# Patient Record
Sex: Female | Born: 1950 | Race: White | Hispanic: No | Marital: Single | Smoking: Never smoker
Health system: Southern US, Community
[De-identification: ages and names within clinical notes are randomized; demographics above are authoritative.]

## PROBLEM LIST (undated history)

## (undated) DIAGNOSIS — I350 Nonrheumatic aortic (valve) stenosis: Secondary | ICD-10-CM

## (undated) DIAGNOSIS — I480 Paroxysmal atrial fibrillation: Secondary | ICD-10-CM

## (undated) DIAGNOSIS — I10 Essential (primary) hypertension: Secondary | ICD-10-CM

## (undated) DIAGNOSIS — I059 Rheumatic mitral valve disease, unspecified: Secondary | ICD-10-CM

## (undated) DIAGNOSIS — S065XAA Traumatic subdural hemorrhage with loss of consciousness status unknown, initial encounter: Secondary | ICD-10-CM

## (undated) DIAGNOSIS — I4719 Other supraventricular tachycardia: Secondary | ICD-10-CM

## (undated) DIAGNOSIS — I5042 Chronic combined systolic (congestive) and diastolic (congestive) heart failure: Secondary | ICD-10-CM

## (undated) DIAGNOSIS — K766 Portal hypertension: Secondary | ICD-10-CM

## (undated) DIAGNOSIS — D696 Thrombocytopenia, unspecified: Secondary | ICD-10-CM

## (undated) DIAGNOSIS — E0590 Thyrotoxicosis, unspecified without thyrotoxic crisis or storm: Secondary | ICD-10-CM

## (undated) DIAGNOSIS — Z9289 Personal history of other medical treatment: Secondary | ICD-10-CM

## (undated) DIAGNOSIS — R0600 Dyspnea, unspecified: Secondary | ICD-10-CM

## (undated) DIAGNOSIS — E039 Hypothyroidism, unspecified: Secondary | ICD-10-CM

## (undated) DIAGNOSIS — I471 Supraventricular tachycardia: Secondary | ICD-10-CM

## (undated) DIAGNOSIS — Z954 Presence of other heart-valve replacement: Secondary | ICD-10-CM

## (undated) DIAGNOSIS — D649 Anemia, unspecified: Secondary | ICD-10-CM

## (undated) DIAGNOSIS — J189 Pneumonia, unspecified organism: Secondary | ICD-10-CM

## (undated) DIAGNOSIS — I484 Atypical atrial flutter: Secondary | ICD-10-CM

## (undated) DIAGNOSIS — Z952 Presence of prosthetic heart valve: Secondary | ICD-10-CM

## (undated) DIAGNOSIS — I8500 Esophageal varices without bleeding: Secondary | ICD-10-CM

## (undated) DIAGNOSIS — S065X9A Traumatic subdural hemorrhage with loss of consciousness of unspecified duration, initial encounter: Secondary | ICD-10-CM

## (undated) DIAGNOSIS — I2510 Atherosclerotic heart disease of native coronary artery without angina pectoris: Secondary | ICD-10-CM

## (undated) DIAGNOSIS — R161 Splenomegaly, not elsewhere classified: Secondary | ICD-10-CM

## (undated) DIAGNOSIS — I00 Rheumatic fever without heart involvement: Secondary | ICD-10-CM

## (undated) DIAGNOSIS — I272 Pulmonary hypertension, unspecified: Secondary | ICD-10-CM

## (undated) DIAGNOSIS — E669 Obesity, unspecified: Secondary | ICD-10-CM

## (undated) HISTORY — DX: Esophageal varices without bleeding: I85.00

## (undated) HISTORY — PX: BRAIN SURGERY: SHX531

## (undated) HISTORY — PX: CARDIAC CATHETERIZATION: SHX172

## (undated) HISTORY — DX: Pulmonary hypertension, unspecified: I27.20

## (undated) HISTORY — DX: Nonrheumatic aortic (valve) stenosis: I35.0

## (undated) HISTORY — DX: Paroxysmal atrial fibrillation: I48.0

## (undated) HISTORY — DX: Splenomegaly, not elsewhere classified: R16.1

## (undated) HISTORY — DX: Obesity, unspecified: E66.9

## (undated) HISTORY — DX: Supraventricular tachycardia: I47.1

## (undated) HISTORY — PX: TONSILLECTOMY: SUR1361

## (undated) HISTORY — DX: Portal hypertension: K76.6

## (undated) HISTORY — DX: Atypical atrial flutter: I48.4

## (undated) HISTORY — DX: Presence of other heart-valve replacement: Z95.4

## (undated) HISTORY — DX: Thyrotoxicosis, unspecified without thyrotoxic crisis or storm: E05.90

## (undated) HISTORY — DX: Essential (primary) hypertension: I10

## (undated) HISTORY — DX: Rheumatic mitral valve disease, unspecified: I05.9

## (undated) HISTORY — DX: Traumatic subdural hemorrhage with loss of consciousness status unknown, initial encounter: S06.5XAA

## (undated) HISTORY — DX: Chronic combined systolic (congestive) and diastolic (congestive) heart failure: I50.42

## (undated) HISTORY — DX: Traumatic subdural hemorrhage with loss of consciousness of unspecified duration, initial encounter: S06.5X9A

## (undated) HISTORY — DX: Rheumatic fever without heart involvement: I00

## (undated) HISTORY — DX: Other supraventricular tachycardia: I47.19

## (undated) HISTORY — PX: CARDIAC VALVE REPLACEMENT: SHX585

## (undated) HISTORY — DX: Hypothyroidism, unspecified: E03.9

---

## 1990-05-30 HISTORY — PX: LAPAROSCOPIC CHOLECYSTECTOMY: SUR755

## 1991-04-08 DIAGNOSIS — Z954 Presence of other heart-valve replacement: Secondary | ICD-10-CM

## 1991-04-08 HISTORY — DX: Presence of other heart-valve replacement: Z95.4

## 1991-04-08 HISTORY — PX: MITRAL VALVE REPLACEMENT: SHX147

## 1994-07-23 HISTORY — PX: TOTAL ABDOMINAL HYSTERECTOMY: SHX209

## 1997-07-27 ENCOUNTER — Ambulatory Visit (HOSPITAL_COMMUNITY): Admission: RE | Admit: 1997-07-27 | Discharge: 1997-07-27 | Payer: Self-pay | Admitting: Cardiology

## 1997-07-27 ENCOUNTER — Encounter (HOSPITAL_COMMUNITY): Admission: RE | Admit: 1997-07-27 | Discharge: 1997-10-25 | Payer: Self-pay | Admitting: Cardiology

## 1998-02-25 ENCOUNTER — Ambulatory Visit (HOSPITAL_COMMUNITY): Admission: RE | Admit: 1998-02-25 | Discharge: 1998-02-25 | Payer: Self-pay | Admitting: *Deleted

## 1998-04-02 HISTORY — PX: SUBDURAL HEMATOMA EVACUATION VIA CRANIOTOMY: SUR319

## 1998-05-24 ENCOUNTER — Ambulatory Visit (HOSPITAL_COMMUNITY): Admission: RE | Admit: 1998-05-24 | Discharge: 1998-05-24 | Payer: Self-pay | Admitting: *Deleted

## 1998-06-15 ENCOUNTER — Encounter: Payer: Self-pay | Admitting: Endocrinology

## 1998-06-15 ENCOUNTER — Ambulatory Visit (HOSPITAL_COMMUNITY): Admission: RE | Admit: 1998-06-15 | Discharge: 1998-06-15 | Payer: Self-pay | Admitting: Endocrinology

## 1998-10-24 ENCOUNTER — Inpatient Hospital Stay (HOSPITAL_COMMUNITY): Admission: RE | Admit: 1998-10-24 | Discharge: 1998-10-27 | Payer: Self-pay | Admitting: Neurosurgery

## 1998-10-25 ENCOUNTER — Encounter: Payer: Self-pay | Admitting: Neurosurgery

## 1998-11-01 ENCOUNTER — Ambulatory Visit (HOSPITAL_COMMUNITY): Admission: RE | Admit: 1998-11-01 | Discharge: 1998-11-01 | Payer: Self-pay | Admitting: Neurosurgery

## 1998-11-01 ENCOUNTER — Encounter: Payer: Self-pay | Admitting: Neurosurgery

## 1998-11-23 ENCOUNTER — Ambulatory Visit (HOSPITAL_COMMUNITY): Admission: RE | Admit: 1998-11-23 | Discharge: 1998-11-23 | Payer: Self-pay | Admitting: Neurosurgery

## 1998-11-23 ENCOUNTER — Encounter: Payer: Self-pay | Admitting: Neurosurgery

## 1999-01-30 ENCOUNTER — Encounter: Admission: RE | Admit: 1999-01-30 | Discharge: 1999-01-30 | Payer: Self-pay | Admitting: *Deleted

## 1999-02-28 ENCOUNTER — Ambulatory Visit (HOSPITAL_COMMUNITY): Admission: RE | Admit: 1999-02-28 | Discharge: 1999-02-28 | Payer: Self-pay | Admitting: *Deleted

## 2000-03-01 ENCOUNTER — Ambulatory Visit (HOSPITAL_COMMUNITY): Admission: RE | Admit: 2000-03-01 | Discharge: 2000-03-01 | Payer: Self-pay | Admitting: Internal Medicine

## 2000-03-01 ENCOUNTER — Encounter: Payer: Self-pay | Admitting: Internal Medicine

## 2001-03-12 ENCOUNTER — Ambulatory Visit (HOSPITAL_COMMUNITY): Admission: RE | Admit: 2001-03-12 | Discharge: 2001-03-12 | Payer: Self-pay | Admitting: Internal Medicine

## 2001-03-12 ENCOUNTER — Encounter: Payer: Self-pay | Admitting: Internal Medicine

## 2002-03-18 ENCOUNTER — Encounter: Payer: Self-pay | Admitting: Internal Medicine

## 2002-03-18 ENCOUNTER — Ambulatory Visit (HOSPITAL_COMMUNITY): Admission: RE | Admit: 2002-03-18 | Discharge: 2002-03-18 | Payer: Self-pay | Admitting: Internal Medicine

## 2003-03-18 ENCOUNTER — Encounter: Admission: RE | Admit: 2003-03-18 | Discharge: 2003-03-18 | Payer: Self-pay | Admitting: Internal Medicine

## 2003-03-23 ENCOUNTER — Ambulatory Visit (HOSPITAL_COMMUNITY): Admission: RE | Admit: 2003-03-23 | Discharge: 2003-03-23 | Payer: Self-pay | Admitting: Internal Medicine

## 2003-07-01 ENCOUNTER — Emergency Department (HOSPITAL_COMMUNITY): Admission: EM | Admit: 2003-07-01 | Discharge: 2003-07-01 | Payer: Self-pay | Admitting: Emergency Medicine

## 2004-04-11 ENCOUNTER — Ambulatory Visit (HOSPITAL_COMMUNITY): Admission: RE | Admit: 2004-04-11 | Discharge: 2004-04-11 | Payer: Self-pay | Admitting: Internal Medicine

## 2005-04-19 ENCOUNTER — Ambulatory Visit (HOSPITAL_COMMUNITY): Admission: RE | Admit: 2005-04-19 | Discharge: 2005-04-19 | Payer: Self-pay | Admitting: Internal Medicine

## 2006-05-20 ENCOUNTER — Ambulatory Visit (HOSPITAL_COMMUNITY): Admission: RE | Admit: 2006-05-20 | Discharge: 2006-05-20 | Payer: Self-pay | Admitting: *Deleted

## 2006-09-26 ENCOUNTER — Encounter: Admission: RE | Admit: 2006-09-26 | Discharge: 2006-09-26 | Payer: Self-pay | Admitting: Endocrinology

## 2006-10-10 ENCOUNTER — Encounter: Admission: RE | Admit: 2006-10-10 | Discharge: 2006-10-10 | Payer: Self-pay | Admitting: Endocrinology

## 2007-05-22 ENCOUNTER — Ambulatory Visit (HOSPITAL_COMMUNITY): Admission: RE | Admit: 2007-05-22 | Discharge: 2007-05-22 | Payer: Self-pay | Admitting: Internal Medicine

## 2008-01-23 ENCOUNTER — Other Ambulatory Visit: Admission: RE | Admit: 2008-01-23 | Discharge: 2008-01-23 | Payer: Self-pay | Admitting: Internal Medicine

## 2008-06-22 ENCOUNTER — Ambulatory Visit (HOSPITAL_COMMUNITY): Admission: RE | Admit: 2008-06-22 | Discharge: 2008-06-22 | Payer: Self-pay | Admitting: Internal Medicine

## 2009-06-27 ENCOUNTER — Ambulatory Visit (HOSPITAL_COMMUNITY): Admission: RE | Admit: 2009-06-27 | Discharge: 2009-06-27 | Payer: Self-pay | Admitting: Internal Medicine

## 2009-07-25 ENCOUNTER — Ambulatory Visit: Payer: Self-pay | Admitting: Oncology

## 2009-07-29 ENCOUNTER — Inpatient Hospital Stay (HOSPITAL_COMMUNITY): Admission: AD | Admit: 2009-07-29 | Discharge: 2009-08-02 | Payer: Self-pay | Admitting: Cardiology

## 2009-07-29 ENCOUNTER — Inpatient Hospital Stay (HOSPITAL_BASED_OUTPATIENT_CLINIC_OR_DEPARTMENT_OTHER): Admission: RE | Admit: 2009-07-29 | Discharge: 2009-07-29 | Payer: Self-pay | Admitting: Cardiology

## 2009-07-30 ENCOUNTER — Ambulatory Visit: Payer: Self-pay | Admitting: Oncology

## 2009-08-01 ENCOUNTER — Encounter (INDEPENDENT_AMBULATORY_CARE_PROVIDER_SITE_OTHER): Payer: Self-pay | Admitting: Cardiology

## 2009-08-02 ENCOUNTER — Ambulatory Visit: Payer: Self-pay | Admitting: Oncology

## 2009-09-15 ENCOUNTER — Ambulatory Visit: Payer: Self-pay | Admitting: Oncology

## 2009-09-19 LAB — CBC WITH DIFFERENTIAL/PLATELET
BASO%: 0.3 % (ref 0.0–2.0)
Basophils Absolute: 0 10*3/uL (ref 0.0–0.1)
EOS%: 3.1 % (ref 0.0–7.0)
HGB: 11.6 g/dL (ref 11.6–15.9)
LYMPH%: 20.6 % (ref 14.0–49.7)
MCV: 91.5 fL (ref 79.5–101.0)
MONO%: 7.4 % (ref 0.0–14.0)
NEUT#: 3.7 10*3/uL (ref 1.5–6.5)
NEUT%: 68.6 % (ref 38.4–76.8)
Platelets: 77 10*3/uL — ABNORMAL LOW (ref 145–400)

## 2009-09-19 LAB — MORPHOLOGY: PLT EST: DECREASED

## 2010-06-05 ENCOUNTER — Other Ambulatory Visit (HOSPITAL_COMMUNITY): Payer: Self-pay | Admitting: Internal Medicine

## 2010-06-05 DIAGNOSIS — Z1231 Encounter for screening mammogram for malignant neoplasm of breast: Secondary | ICD-10-CM

## 2010-06-20 LAB — BASIC METABOLIC PANEL
BUN: 16 mg/dL (ref 6–23)
Calcium: 8.9 mg/dL (ref 8.4–10.5)
Creatinine, Ser: 0.75 mg/dL (ref 0.4–1.2)
GFR calc non Af Amer: >60 mL/min (ref >60)
Glucose, Bld: 165 mg/dL — ABNORMAL HIGH (ref 70–99)
Potassium: 3.9 mEq/L (ref 3.5–5.1)
Sodium: 135 mEq/L (ref 135–145)

## 2010-06-20 LAB — LACTATE DEHYDROGENASE: LDH: 219 U/L (ref 94–250)

## 2010-06-20 LAB — CROSSMATCH

## 2010-06-20 LAB — DIFFERENTIAL
Basophils Absolute: 0 10*3/uL (ref 0.0–0.1)
Basophils Absolute: 0 10*3/uL (ref 0.0–0.1)
Basophils Relative: 0 % (ref 0–1)
Basophils Relative: 0 % (ref 0–1)
Basophils Relative: 1 % (ref 0–1)
Eosinophils Absolute: 0.1 10*3/uL (ref 0.0–0.7)
Eosinophils Relative: 1 % (ref 0–5)
Eosinophils Relative: 4 % (ref 0–5)
Eosinophils Relative: 3 % (ref 0–5)
Lymphocytes Relative: 16 % (ref 12–46)
Lymphocytes Relative: 20 % (ref 12–46)
Lymphocytes Relative: 21 % (ref 12–46)
Lymphs Abs: 0.9 10*3/uL (ref 0.7–4.0)
Lymphs Abs: 1.3 10*3/uL (ref 0.7–4.0)
Lymphs Abs: 1 10*3/uL (ref 0.7–4.0)
Monocytes Absolute: 0.3 10*3/uL (ref 0.1–1.0)
Monocytes Absolute: 0.4 10*3/uL (ref 0.1–1.0)
Monocytes Absolute: 0.4 10*3/uL (ref 0.1–1.0)
Monocytes Relative: 7 % (ref 3–12)
Monocytes Relative: 7 % (ref 3–12)
Neutro Abs: 3.4 10*3/uL (ref 1.7–7.7)
Neutro Abs: 2.4 10*3/uL (ref 1.7–7.7)
Neutrophils Relative %: 68 % (ref 43–77)

## 2010-06-20 LAB — HEPARIN INDUCED THROMBOCYTOPENIA PNL
Patient O.D.: 0.282
UFH High Dose UFH H: 0 % Release
UFH Low Dose 0.1 IU/mL: 0 % Release
UFH Low Dose 0.5 IU/mL: 0 % Release
UFH SRA Result: NEGATIVE

## 2010-06-20 LAB — COMPREHENSIVE METABOLIC PANEL
Albumin: 3.5 g/dL (ref 3.5–5.2)
Alkaline Phosphatase: 100 U/L (ref 39–117)
GFR calc Af Amer: >60 mL/min (ref >60)
GFR calc non Af Amer: >60 mL/min (ref >60)
Potassium: 3.7 mEq/L (ref 3.5–5.1)
Sodium: 139 mEq/L (ref 135–145)
Total Bilirubin: 1.8 mg/dL — ABNORMAL HIGH (ref 0.3–1.2)
Total Protein: 6.8 g/dL (ref 6.0–8.3)

## 2010-06-20 LAB — CBC
HCT: 33.7 % — ABNORMAL LOW (ref 36.0–46.0)
HCT: 36.6 % (ref 36.0–46.0)
HCT: 37.8 % (ref 36.0–46.0)
HCT: 32.1 % — ABNORMAL LOW (ref 36.0–46.0)
Hemoglobin: 12.4 g/dL (ref 12.0–15.0)
Hemoglobin: 13 g/dL (ref 12.0–15.0)
Hemoglobin: 13 g/dL (ref 12.0–15.0)
MCHC: 35.9 g/dL (ref 30.0–36.0)
MCV: 92.5 fL (ref 78.0–100.0)
MCV: 93.5 fL (ref 78.0–100.0)
Platelets: 57 10*3/uL — ABNORMAL LOW (ref 150–400)
Platelets: 51 10*3/uL — ABNORMAL LOW (ref 150–400)
Platelets: 59 10*3/uL — ABNORMAL LOW (ref 150–400)
RBC: 3.6 MIL/uL — ABNORMAL LOW (ref 3.87–5.11)
RBC: 3.72 MIL/uL — ABNORMAL LOW (ref 3.87–5.11)
RDW: 15 % (ref 11.5–15.5)
RDW: 15.1 % (ref 11.5–15.5)
RDW: 15.3 % (ref 11.5–15.5)
WBC: 5.9 10*3/uL (ref 4.0–10.5)
WBC: 6.6 10*3/uL (ref 4.0–10.5)
WBC: 5.1 10*3/uL (ref 4.0–10.5)

## 2010-06-20 LAB — PATHOLOGIST SMEAR REVIEW

## 2010-06-20 LAB — PROTIME-INR
Prothrombin Time: 14.4 seconds (ref 11.6–15.2)
Prothrombin Time: 14.3 seconds (ref 11.6–15.2)

## 2010-06-20 LAB — BONE MARROW EXAM

## 2010-06-30 ENCOUNTER — Ambulatory Visit (HOSPITAL_COMMUNITY)
Admission: RE | Admit: 2010-06-30 | Discharge: 2010-06-30 | Disposition: A | Discharge status: Home / Self Care | Payer: 59 | Source: Ambulatory Visit | Attending: Internal Medicine | Admitting: Internal Medicine

## 2010-06-30 DIAGNOSIS — Z1231 Encounter for screening mammogram for malignant neoplasm of breast: Secondary | ICD-10-CM | POA: Insufficient documentation

## 2011-07-09 ENCOUNTER — Other Ambulatory Visit (HOSPITAL_COMMUNITY): Payer: Self-pay | Admitting: Internal Medicine

## 2011-07-09 DIAGNOSIS — Z1231 Encounter for screening mammogram for malignant neoplasm of breast: Secondary | ICD-10-CM

## 2011-08-01 ENCOUNTER — Ambulatory Visit (HOSPITAL_COMMUNITY)
Admission: RE | Admit: 2011-08-01 | Discharge: 2011-08-01 | Disposition: A | Discharge status: Home / Self Care | Payer: 59 | Source: Ambulatory Visit | Attending: Internal Medicine | Admitting: Internal Medicine

## 2011-08-01 DIAGNOSIS — Z1231 Encounter for screening mammogram for malignant neoplasm of breast: Secondary | ICD-10-CM | POA: Insufficient documentation

## 2012-06-27 ENCOUNTER — Other Ambulatory Visit (HOSPITAL_COMMUNITY): Payer: Self-pay | Admitting: Internal Medicine

## 2012-06-27 DIAGNOSIS — Z1231 Encounter for screening mammogram for malignant neoplasm of breast: Secondary | ICD-10-CM

## 2012-08-12 ENCOUNTER — Ambulatory Visit (HOSPITAL_COMMUNITY)
Admission: RE | Admit: 2012-08-12 | Discharge: 2012-08-12 | Disposition: A | Discharge status: Home / Self Care | Payer: 59 | Source: Ambulatory Visit | Attending: Internal Medicine | Admitting: Internal Medicine

## 2012-08-12 DIAGNOSIS — Z1231 Encounter for screening mammogram for malignant neoplasm of breast: Secondary | ICD-10-CM | POA: Insufficient documentation

## 2013-01-15 ENCOUNTER — Encounter: Payer: Self-pay | Admitting: *Deleted

## 2013-01-15 ENCOUNTER — Encounter: Payer: Self-pay | Admitting: Cardiology

## 2013-01-15 DIAGNOSIS — I10 Essential (primary) hypertension: Secondary | ICD-10-CM | POA: Insufficient documentation

## 2013-01-15 DIAGNOSIS — I059 Rheumatic mitral valve disease, unspecified: Secondary | ICD-10-CM | POA: Insufficient documentation

## 2013-01-15 DIAGNOSIS — E039 Hypothyroidism, unspecified: Secondary | ICD-10-CM | POA: Insufficient documentation

## 2013-01-15 DIAGNOSIS — E0590 Thyrotoxicosis, unspecified without thyrotoxic crisis or storm: Secondary | ICD-10-CM | POA: Insufficient documentation

## 2013-01-15 DIAGNOSIS — I519 Heart disease, unspecified: Secondary | ICD-10-CM | POA: Insufficient documentation

## 2013-01-15 DIAGNOSIS — T148XXA Other injury of unspecified body region, initial encounter: Secondary | ICD-10-CM | POA: Insufficient documentation

## 2013-01-15 DIAGNOSIS — I4819 Other persistent atrial fibrillation: Secondary | ICD-10-CM | POA: Insufficient documentation

## 2013-01-18 ENCOUNTER — Encounter: Payer: Self-pay | Admitting: Cardiology

## 2013-01-18 DIAGNOSIS — I099 Rheumatic heart disease, unspecified: Secondary | ICD-10-CM | POA: Insufficient documentation

## 2013-01-18 DIAGNOSIS — E70319 Ocular albinism, unspecified: Secondary | ICD-10-CM | POA: Insufficient documentation

## 2013-01-19 ENCOUNTER — Ambulatory Visit (HOSPITAL_COMMUNITY): Payer: 59 | Attending: Cardiology | Admitting: Radiology

## 2013-01-19 ENCOUNTER — Encounter: Payer: Self-pay | Admitting: Cardiology

## 2013-01-19 ENCOUNTER — Ambulatory Visit (INDEPENDENT_AMBULATORY_CARE_PROVIDER_SITE_OTHER): Payer: 59 | Admitting: Cardiology

## 2013-01-19 ENCOUNTER — Other Ambulatory Visit (HOSPITAL_COMMUNITY): Payer: Self-pay | Admitting: Cardiology

## 2013-01-19 VITALS — BP 144/79 | HR 83 | Wt 182.0 lb

## 2013-01-19 DIAGNOSIS — I5189 Other ill-defined heart diseases: Secondary | ICD-10-CM

## 2013-01-19 DIAGNOSIS — I4891 Unspecified atrial fibrillation: Secondary | ICD-10-CM | POA: Insufficient documentation

## 2013-01-19 DIAGNOSIS — I10 Essential (primary) hypertension: Secondary | ICD-10-CM

## 2013-01-19 DIAGNOSIS — I059 Rheumatic mitral valve disease, unspecified: Secondary | ICD-10-CM

## 2013-01-19 DIAGNOSIS — I359 Nonrheumatic aortic valve disorder, unspecified: Secondary | ICD-10-CM

## 2013-01-19 DIAGNOSIS — I2789 Other specified pulmonary heart diseases: Secondary | ICD-10-CM

## 2013-01-19 DIAGNOSIS — I272 Pulmonary hypertension, unspecified: Secondary | ICD-10-CM

## 2013-01-19 DIAGNOSIS — I519 Heart disease, unspecified: Secondary | ICD-10-CM

## 2013-01-19 DIAGNOSIS — R0602 Shortness of breath: Secondary | ICD-10-CM

## 2013-01-19 DIAGNOSIS — I351 Nonrheumatic aortic (valve) insufficiency: Secondary | ICD-10-CM | POA: Insufficient documentation

## 2013-01-19 DIAGNOSIS — I5042 Chronic combined systolic (congestive) and diastolic (congestive) heart failure: Secondary | ICD-10-CM | POA: Insufficient documentation

## 2013-01-19 DIAGNOSIS — I350 Nonrheumatic aortic (valve) stenosis: Secondary | ICD-10-CM

## 2013-01-19 DIAGNOSIS — I099 Rheumatic heart disease, unspecified: Secondary | ICD-10-CM | POA: Insufficient documentation

## 2013-01-19 DIAGNOSIS — I079 Rheumatic tricuspid valve disease, unspecified: Secondary | ICD-10-CM | POA: Insufficient documentation

## 2013-01-19 LAB — BASIC METABOLIC PANEL
CO2: 30 mEq/L (ref 19–32)
Calcium: 9.6 mg/dL (ref 8.4–10.5)
GFR: 58.9 mL/min — ABNORMAL LOW (ref >60.00)
Potassium: 4.2 mEq/L (ref 3.5–5.1)
Sodium: 140 mEq/L (ref 135–145)

## 2013-01-19 NOTE — Progress Notes (Signed)
Echocardiogram performed.  

## 2013-01-19 NOTE — Patient Instructions (Signed)
Please go to the lab today and have a BMET and a BNP drawn.  Your physician recommends that you continue on your current medications as directed. Please refer to the Current Medication list given to you today.  Your physician wants you to follow-up in: 6 months with Dr. Sherlyn Lick will receive a reminder letter in the mail two months in advance. If you don't receive a letter, please call our office to schedule the follow-up appointment.

## 2013-01-19 NOTE — Progress Notes (Signed)
[address] [place], [place]  [address] Phone: [phone] Fax:  [phone]  Date:  01/19/2013   ID:  Jamie Howell, DOB 11/26/1950, MRN [id number]  PCP:  No primary provider on file.  Cardiologist:  Armanda Magic, MD     History of Present Illness: Jamie Howell is a 62 y.o. female with a history of Rheumatic heart disease s/p St Jude MVR, atrial fibrillation, HTN, moderate AS, grade III diastolic dysfunction, mild pulmonary HTN who presents today for followup.  She is doing well.  She denies any chest pain, dizziness, palpitations, LE edema, or syncope.  She denies any PND or orthopnea but has noticed some DOE when walking up stairs or hills outside but not inside.  This seems to increase when she has gained weight.  Her weight fluctuates from time to time by about 10 pounds and is 10 pounds up from last OV.  She denies any LE edema.     Wt Readings from Last 3 Encounters:  01/19/13 182 lb (82.555 kg)     Past Medical History  Diagnosis Date  . HTN (hypertension)   . Thyrotoxicosis      without mention of goiter or other cause, without mention of thyrotoxic crisis or storm  . Hypothyroidism   . 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 Atrial fibrillation   . Atrial flutter   . Heart disease   . Hematoma   . Subdural hematoma   . Pulmonary HTN     mild  . OA (ocular albinism)   . Aortic valve stenosis     moderate aortic valve stenosis with moderate aortic insufficiency, stable MVR, trivial PR, mild TR, severe LAE, grade II-III diastolic dysfunction,mild pulmonary HTN , EF 50% by echo 12/2012    Current Outpatient Prescriptions  Medication Sig Dispense Refill  . levothyroxine (SYNTHROID, LEVOTHROID) 150 MCG tablet Take 150 mcg by mouth daily before breakfast.      . amLODipine (NORVASC) 2.5 MG tablet Take 2.5 mg by mouth daily.      . cyclobenzaprine (FLEXERIL) 10 MG tablet Take 10 mg by mouth 3 (three) times daily  as needed for muscle spasms. 1/2 tablet      . furosemide (LASIX) 20 MG tablet Take 20 mg by mouth.      . metoprolol succinate (TOPROL-XL) 50 MG 24 hr tablet Take 50 mg by mouth daily. Take with or immediately following a meal.      . warfarin (COUMADIN) 2.5 MG tablet Take 2.5 mg by mouth daily.       No current facility-administered medications for this visit.    Allergies:    Allergies  Allergen Reactions  . Penicillins   . Sulfa Antibiotics     Social History:  The patient  reports that she has never smoked. She does not have any smokeless tobacco history on file. She reports that she does not drink alcohol or use illicit drugs.   Family History:  The patient's family history includes CAD in her father and mother; Cirrhosis in her brother; Heart attack in her sister; Hypertension in her father.   ROS:  Please see the history of present illness.      All other systems reviewed and negative.   PHYSICAL EXAM: VS:  BP 144/79  Pulse 83  Wt 182 lb (82.555 kg) Well nourished, well developed, in no acute distress HEENT: normal Neck: no JVD Cardiac:  normal S1, S2; RRR; 2/6 SM at RUSB radiating to LLSB, crisp mechanical heart sounds Lungs:  clear to auscultation bilaterally, no wheezing, rhonchi or rales Abd: soft, nontender, no hepatomegaly Ext: no edema Skin: warm and dry Neuro:  CNs 2-12 intact, no focal abnormalities noted      ASSESSMENT AND PLAN:  1. Atrial fibrilation maintaining NSR  - continue metoprolol and warfarin 2. Chronic anticoagulation 3. HTN - fairly well controlled  - continue metoprolol 4. Rheumatic heart disease s/p mechanical MVR 5. Moderate AS/AR - no change since echo 2013 6. Grade III diastolic dysfunction  - continue Lasix  - check BMET 7. Mild pulmonary HTN 8. I suspect that her DOE is due to obesity and increase weight gain.  Her echo appears stable.  She had a cath in 2011 that showed normal coronary arteries  - Check BNP  - I have encouraged  her to try to exercise and watch her diet to try to lose some weight.  She will let me know if her SOB worsens.  Followup with me in 6 months  Signed, Armanda Magic, MD 01/19/2013 2:30 PM

## 2013-05-20 ENCOUNTER — Telehealth: Payer: Self-pay | Admitting: Pharmacist

## 2013-05-20 NOTE — Telephone Encounter (Signed)
Received a fax from [facility] requesting a refill for warfarin.  Patient stated 10 months ago that she was going to go elsewhere to have protimes managed.  I called and spoke with patient and she tells me she has [facility] to have protime.  She has enough coumadin to make it to this visit.  I informed her that they would be the ones to refill her warfarin as they will be monitoring protimes, and that she will need to ask for a refill when sees them next week.  She is agreeable to this, and shows verbal understanding.

## 2013-05-20 NOTE — Telephone Encounter (Signed)
Message copied by Lou MinerSMART, Holly Pring G on Wed May 20, 2013 12:03 PM ------      Message from: Carmela HurtADAMS, KIMBERLY G      Created: Mon May 18, 2013  9:11 AM      Regarding: refill coumadin       [facility] ------

## 2013-07-13 ENCOUNTER — Other Ambulatory Visit (HOSPITAL_COMMUNITY): Payer: Self-pay | Admitting: Internal Medicine

## 2013-07-13 DIAGNOSIS — Z1231 Encounter for screening mammogram for malignant neoplasm of breast: Secondary | ICD-10-CM

## 2013-08-28 ENCOUNTER — Ambulatory Visit (HOSPITAL_COMMUNITY)
Admission: RE | Admit: 2013-08-28 | Discharge: 2013-08-28 | Disposition: A | Discharge status: Home / Self Care | Payer: 59 | Source: Ambulatory Visit | Attending: Internal Medicine | Admitting: Internal Medicine

## 2013-08-28 DIAGNOSIS — Z1231 Encounter for screening mammogram for malignant neoplasm of breast: Secondary | ICD-10-CM

## 2013-09-17 ENCOUNTER — Other Ambulatory Visit: Payer: Self-pay | Admitting: *Deleted

## 2013-09-17 MED ORDER — AMLODIPINE BESYLATE 2.5 MG PO TABS: 2.5000 mg | ORAL_TABLET | Freq: Every day | ORAL | Status: DC

## 2013-10-22 ENCOUNTER — Other Ambulatory Visit: Payer: Self-pay

## 2013-10-22 MED ORDER — AMLODIPINE BESYLATE 2.5 MG PO TABS: 2.5000 mg | ORAL_TABLET | Freq: Every day | ORAL | Status: DC

## 2013-10-22 MED ORDER — METOPROLOL SUCCINATE ER 50 MG PO TB24: 50.0000 mg | ORAL_TABLET | Freq: Every day | ORAL | Status: DC

## 2013-12-10 ENCOUNTER — Ambulatory Visit (INDEPENDENT_AMBULATORY_CARE_PROVIDER_SITE_OTHER): Payer: 59 | Admitting: Cardiology

## 2013-12-10 ENCOUNTER — Encounter: Payer: Self-pay | Admitting: Cardiology

## 2013-12-10 VITALS — BP 124/82 | HR 80 | Ht 59.0 in | Wt 191.0 lb

## 2013-12-10 DIAGNOSIS — I10 Essential (primary) hypertension: Secondary | ICD-10-CM

## 2013-12-10 DIAGNOSIS — I480 Paroxysmal atrial fibrillation: Secondary | ICD-10-CM

## 2013-12-10 DIAGNOSIS — I350 Nonrheumatic aortic (valve) stenosis: Secondary | ICD-10-CM

## 2013-12-10 DIAGNOSIS — I059 Rheumatic mitral valve disease, unspecified: Secondary | ICD-10-CM

## 2013-12-10 DIAGNOSIS — I5032 Chronic diastolic (congestive) heart failure: Secondary | ICD-10-CM

## 2013-12-10 DIAGNOSIS — I509 Heart failure, unspecified: Secondary | ICD-10-CM

## 2013-12-10 DIAGNOSIS — I4891 Unspecified atrial fibrillation: Secondary | ICD-10-CM

## 2013-12-10 DIAGNOSIS — R0602 Shortness of breath: Secondary | ICD-10-CM

## 2013-12-10 DIAGNOSIS — I359 Nonrheumatic aortic valve disorder, unspecified: Secondary | ICD-10-CM

## 2013-12-10 DIAGNOSIS — I272 Pulmonary hypertension, unspecified: Secondary | ICD-10-CM

## 2013-12-10 DIAGNOSIS — I2789 Other specified pulmonary heart diseases: Secondary | ICD-10-CM

## 2013-12-10 LAB — BASIC METABOLIC PANEL
BUN: 18 mg/dL (ref 6–23)
CALCIUM: 8.9 mg/dL (ref 8.4–10.5)
CO2: 28 meq/L (ref 19–32)
Chloride: 102 mEq/L (ref 96–112)
Creatinine, Ser: 1 mg/dL (ref 0.4–1.2)
GFR: 58.73 mL/min — ABNORMAL LOW (ref >60.00)
Glucose, Bld: 156 mg/dL — ABNORMAL HIGH (ref 70–99)
Potassium: 3.8 mEq/L (ref 3.5–5.1)
Sodium: 136 mEq/L (ref 135–145)

## 2013-12-10 LAB — BRAIN NATRIURETIC PEPTIDE: PRO B NATRI PEPTIDE: 329 pg/mL — AB (ref 0.0–100.0)

## 2013-12-10 MED ORDER — ASPIRIN EC 81 MG PO TBEC: 81.0000 mg | DELAYED_RELEASE_TABLET | Freq: Every day | ORAL | Status: DC

## 2013-12-10 NOTE — Progress Notes (Signed)
[address] [place], [place]  [address] Phone: [phone] Fax:  [phone]  Date:  12/10/2013   ID:  Jamie Howell, DOB Nov 12, 1950, MRN [id number]  PCP:  Londell Moh, MD  Cardiologist:  Armanda Magic, MD     History of Present Illness: Jamie Howell is a 63 y.o. female with a history of Rheumatic heart disease s/p St Jude MVR, atrial fibrillation, HTN, moderate AS, grade III diastolic dysfunction, mild pulmonary HTN who presents today for followup. She is doing well. She denies any chest pain, dizziness, palpitations, LE edema, or syncope. She denies any PND or orthopnea but has some chronic DOE when walking outside in the humid hot weather. This seems to increase when she has gained weight and she has gained 9 lbs since last year.  She is not walking currently due to the hot weather.    Wt Readings from Last 3 Encounters:  12/10/13 191 lb (86.637 kg)  01/19/13 182 lb (82.555 kg)     Past Medical History  Diagnosis Date  . HTN (hypertension)   . Thyrotoxicosis      without mention of goiter or other cause, without mention of thyrotoxic crisis or storm  . Hypothyroidism   . 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 Atrial fibrillation   . Atrial flutter   . Heart disease   . Hematoma   . Subdural hematoma   . Pulmonary HTN     mild  . OA (ocular albinism)   . Aortic valve stenosis     moderate aortic valve stenosis with moderate aortic insufficiency, stable MVR, trivial PR, mild TR, severe LAE, grade II-III diastolic dysfunction,mild pulmonary HTN , EF 50% by echo 12/2012    Current Outpatient Prescriptions  Medication Sig Dispense Refill  . amLODipine (NORVASC) 2.5 MG tablet Take 1 tablet (2.5 mg total) by mouth daily.  30 tablet  1  . Calcium Carb-Cholecalciferol (CALCIUM 1000 + D PO) Take 1,000 mg by mouth daily.      . cyclobenzaprine (FLEXERIL) 10 MG tablet Take 10 mg by mouth 3 (three) times daily as  needed for muscle spasms. 1/2 tablet      . furosemide (LASIX) 20 MG tablet Take 20 mg by mouth.      . levothyroxine (SYNTHROID, LEVOTHROID) 150 MCG tablet Take 150 mcg by mouth daily before breakfast.      . metoprolol succinate (TOPROL-XL) 50 MG 24 hr tablet Take 1 tablet (50 mg total) by mouth daily. Take with or immediately following a meal.  30 tablet  1  . warfarin (COUMADIN) 2.5 MG tablet Take 2.5 mg by mouth daily.       No current facility-administered medications for this visit.    Allergies:    Allergies  Allergen Reactions  . Penicillins   . Sulfa Antibiotics     Social History:  The patient  reports that she has never smoked. She does not have any smokeless tobacco history on file. She reports that she does not drink alcohol or use illicit drugs.   Family History:  The patient's family history includes CAD in her father and mother; Cirrhosis in her brother; Heart attack in her sister; Hypertension in her father.   ROS:  Please see the history of present illness.      All other systems reviewed and negative.   PHYSICAL EXAM: VS:  BP 124/82  Pulse 80  Ht 4'  11" (1.499 m)  Wt 191 lb (86.637 kg)  BMI 38.56 kg/m2 Well nourished, well developed, in no acute distress HEENT: normal Neck: no JVD Cardiac:  normal S1, S2; RRR; 2/6 SM at RUSB to LLSB, crisp mechanical heart sounds at apex Lungs:  clear to auscultation bilaterally, no wheezing, rhonchi or rales Abd: soft, nontender, no hepatomegaly Ext: no edema Skin: warm and dry Neuro:  CNs 2-12 intact, no focal abnormalities noted  EKG:  NSR at 70bpm with occasional PVC's and lateral ST abnormality - no change form EKG 2014  ASSESSMENT AND PLAN:  1. Atrial fibrilation maintaining NSR - continue metoprolol and warfarin  - I also instructed her to take ASA  daily 2. Chronic anticoagulation for MVR and PAF - followed by her PCP 3. HTN -  well controlled - continue metoprolol/amlodipine 4. Rheumatic heart disease  s/p mechanical MVR 5. Moderate AS/AR - no change since echo 2014 - repeat 2D echo to reassess AS and pulmonary HTN        6.  Chronic diastolic CHF- with some SOB with exertion in the hot weather.  She appears euvolemic on exam.   - check BMET/BNP - continue Lasix 7. Mild pulmonary HTN 8. Normal coronary arteries by cath 2011 9. Chronic SOB secondary to #6 which is worse out in the hot humid weather - check BNP  Followup with me in 6 months       Signed, Armanda Magic, MD 12/10/2013 10:20 AM

## 2013-12-10 NOTE — Patient Instructions (Signed)
Your physician has recommended you make the following change in your medication: 1. Add a Baby Aspirin 81 MG daily  Your physician recommends that you go to the lab today for a BMET and BNP  Your physician has requested that you have an echocardiogram. Echocardiography is a painless test that uses sound waves to create images of your heart. It provides your doctor with information about the size and shape of your heart and how well your heart's chambers and valves are working. This procedure takes approximately one hour. There are no restrictions for this procedure.  Your physician wants you to follow-up in: 6 months with Dr Sherlyn Lick will receive a reminder letter in the mail two months in advance. If you don't receive a letter, please call our office to schedule the follow-up appointment.

## 2013-12-11 ENCOUNTER — Other Ambulatory Visit: Payer: Self-pay | Admitting: General Surgery

## 2013-12-11 DIAGNOSIS — Z79899 Other long term (current) drug therapy: Secondary | ICD-10-CM

## 2013-12-11 MED ORDER — FUROSEMIDE 20 MG PO TABS: 40.0000 mg | ORAL_TABLET | Freq: Every day | ORAL | Status: DC

## 2013-12-11 MED ORDER — POTASSIUM CHLORIDE CRYS ER 20 MEQ PO TBCR: 20.0000 meq | EXTENDED_RELEASE_TABLET | Freq: Every day | ORAL | Status: DC

## 2013-12-17 ENCOUNTER — Other Ambulatory Visit (HOSPITAL_COMMUNITY): Payer: 59

## 2013-12-18 ENCOUNTER — Other Ambulatory Visit (INDEPENDENT_AMBULATORY_CARE_PROVIDER_SITE_OTHER): Payer: 59

## 2013-12-18 ENCOUNTER — Encounter: Payer: Self-pay | Admitting: General Surgery

## 2013-12-18 DIAGNOSIS — Z79899 Other long term (current) drug therapy: Secondary | ICD-10-CM

## 2013-12-18 LAB — BASIC METABOLIC PANEL
BUN: 20 mg/dL (ref 6–23)
CALCIUM: 8.7 mg/dL (ref 8.4–10.5)
CO2: 28 mEq/L (ref 19–32)
CREATININE: 1.1 mg/dL (ref 0.4–1.2)
Chloride: 101 mEq/L (ref 96–112)
GFR: 54.35 mL/min — AB (ref >60.00)
Glucose, Bld: 286 mg/dL — ABNORMAL HIGH (ref 70–99)
Potassium: 4 mEq/L (ref 3.5–5.1)
SODIUM: 134 meq/L — AB (ref 135–145)

## 2014-01-21 ENCOUNTER — Ambulatory Visit (HOSPITAL_COMMUNITY): Payer: 59 | Attending: Cardiology | Admitting: Radiology

## 2014-01-21 DIAGNOSIS — I10 Essential (primary) hypertension: Secondary | ICD-10-CM | POA: Diagnosis not present

## 2014-01-21 DIAGNOSIS — I350 Nonrheumatic aortic (valve) stenosis: Secondary | ICD-10-CM

## 2014-01-21 DIAGNOSIS — I059 Rheumatic mitral valve disease, unspecified: Secondary | ICD-10-CM

## 2014-01-21 NOTE — Progress Notes (Signed)
Echocardiogram performed.  

## 2014-01-29 ENCOUNTER — Telehealth: Payer: Self-pay | Admitting: Cardiology

## 2014-01-29 NOTE — Telephone Encounter (Signed)
Informed patient hat per Dr. Mayford Knifeurner, her ECHO showed low normal LVF with mildly thickened walls,with moderate to severe AS.  MV prosthesis appears to be functioning normally - repeat echo in 6 months to follow AS.

## 2014-01-29 NOTE — Telephone Encounter (Signed)
New problem   Pt want to know result of echocardiogram. Please call pt at work

## 2014-02-12 ENCOUNTER — Other Ambulatory Visit: Payer: Self-pay | Admitting: *Deleted

## 2014-02-12 MED ORDER — AMLODIPINE BESYLATE 2.5 MG PO TABS: 2.5000 mg | ORAL_TABLET | Freq: Every day | ORAL | Status: DC

## 2014-02-12 MED ORDER — METOPROLOL SUCCINATE ER 50 MG PO TB24: 50.0000 mg | ORAL_TABLET | Freq: Every day | ORAL | Status: DC

## 2014-03-31 ENCOUNTER — Encounter: Payer: Self-pay | Admitting: Cardiology

## 2014-05-07 ENCOUNTER — Ambulatory Visit (INDEPENDENT_AMBULATORY_CARE_PROVIDER_SITE_OTHER): Payer: 59 | Admitting: Cardiology

## 2014-05-07 ENCOUNTER — Encounter: Payer: Self-pay | Admitting: Cardiology

## 2014-05-07 VITALS — BP 150/82 | HR 93 | Ht 59.0 in | Wt 197.0 lb

## 2014-05-07 DIAGNOSIS — R0602 Shortness of breath: Secondary | ICD-10-CM | POA: Insufficient documentation

## 2014-05-07 DIAGNOSIS — I10 Essential (primary) hypertension: Secondary | ICD-10-CM

## 2014-05-07 DIAGNOSIS — I5032 Chronic diastolic (congestive) heart failure: Secondary | ICD-10-CM

## 2014-05-07 DIAGNOSIS — I059 Rheumatic mitral valve disease, unspecified: Secondary | ICD-10-CM

## 2014-05-07 DIAGNOSIS — I272 Pulmonary hypertension, unspecified: Secondary | ICD-10-CM

## 2014-05-07 DIAGNOSIS — I4891 Unspecified atrial fibrillation: Secondary | ICD-10-CM

## 2014-05-07 DIAGNOSIS — I350 Nonrheumatic aortic (valve) stenosis: Secondary | ICD-10-CM

## 2014-05-07 DIAGNOSIS — I480 Paroxysmal atrial fibrillation: Secondary | ICD-10-CM

## 2014-05-07 DIAGNOSIS — I270 Primary pulmonary hypertension: Secondary | ICD-10-CM

## 2014-05-07 LAB — BASIC METABOLIC PANEL
BUN: 26 mg/dL — AB (ref 6–23)
CALCIUM: 9.1 mg/dL (ref 8.4–10.5)
CO2: 30 meq/L (ref 19–32)
CREATININE: 1.06 mg/dL (ref 0.40–1.20)
Chloride: 102 mEq/L (ref 96–112)
GFR: 55.47 mL/min — AB (ref >60.00)
GLUCOSE: 154 mg/dL — AB (ref 70–99)
Potassium: 4 mEq/L (ref 3.5–5.1)
SODIUM: 135 meq/L (ref 135–145)

## 2014-05-07 LAB — BRAIN NATRIURETIC PEPTIDE: Pro B Natriuretic peptide (BNP): 561 pg/mL — ABNORMAL HIGH (ref 0.0–100.0)

## 2014-05-07 NOTE — Patient Instructions (Signed)
Your physician recommends that you have lab work TODAY (BMET, BNP).  Your physician has requested that you have an echocardiogram AS SOON AS POSSIBLE. Echocardiography is a painless test that uses sound waves to create images of your heart. It provides your doctor with information about the size and shape of your heart and how well your heart's chambers and valves are working. This procedure takes approximately one hour. There are no restrictions for this procedure.  Your physician has recommended that you wear an event monitor. Event monitors are medical devices that record the heart's electrical activity. Doctors most often us these monitors to diagnose arrhythmias. Arrhythmias are problems with the speed or rhythm of the heartbeat. The monitor is a small, portable device. You can wear one while you do your normal daily activities. This is usually used to diagnose what is causing palpitations/syncope (passing out).  Your physician wants you to follow-up in: 6 months with Dr. Mayford Knifeurner. You will receive a reminder letter in the mail two months in advance. If you don't receive a letter, please call our office to schedule the follow-up appointment.

## 2014-05-07 NOTE — Progress Notes (Signed)
Cardiology Office Note   Date:  05/07/2014   ID:  Jamie Howell, DOB 07/21/1950, MRN [id number]  PCP:  Londell MohPHARR,[name], MD  Cardiologist:   Quintella ReichertURNER,Mccauley Diehl R, MD   No chief complaint on file.     History of Present Illness: Jamie Howell is a 64 y.o. female with a history of Rheumatic heart disease s/p St Jude MVR, atrial fibrillation, HTN, moderate AS, grade III diastolic dysfunction, mild pulmonary HTN who presents today for followup. She is doing well. She denies any chest pain, dizziness, palpitations, LE edema, or syncope. She denies any PND or orthopnea but has some chronic DOE when walking outside.   She says that her SOB has recently gotten worse with exertion. She had an episode of palpitations the other night and her HR was irregular and she could feel it in her back.    Past Medical History  Diagnosis Date  . HTN (hypertension)   . Thyrotoxicosis      without mention of goiter or other cause, without mention of thyrotoxic crisis or storm  . Hypothyroidism   . 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 Atrial fibrillation   . Atrial flutter   . Heart disease   . Hematoma   . Subdural hematoma   . Pulmonary HTN     mild  . OA (ocular albinism)   . Aortic valve stenosis     moderate aortic valve stenosis with moderate aortic insufficiency, stable MVR, trivial PR, mild TR, severe LAE, grade II-III diastolic dysfunction,mild pulmonary HTN , EF 50% by echo 12/2012    Past Surgical History  Procedure Laterality Date  . Total abdominal hysterectomy    . Mitral valve replacement    . Burr hole    . Mechanical aortic valve replacement       Current Outpatient Prescriptions  Medication Sig Dispense Refill  . amLODipine (NORVASC) 2.5 MG tablet Take 1 tablet (2.5 mg total) by mouth daily. 30 tablet 5  . aspirin EC 81 MG tablet Take 1 tablet (81 mg total) by mouth daily.    . Calcium Carb-Cholecalciferol (CALCIUM 1000 + D PO)  Take 1,000 mg by mouth daily.    . cyclobenzaprine (FLEXERIL) 10 MG tablet Take 10 mg by mouth 3 (three) times daily as needed for muscle spasms. 1/2 tablet    . furosemide (LASIX) 20 MG tablet Take 2 tablets (40 mg total) by mouth daily.    Marland Kitchen. levothyroxine (SYNTHROID, LEVOTHROID) 150 MCG tablet Take 150 mcg by mouth daily before breakfast.    . metoprolol succinate (TOPROL-XL) 50 MG 24 hr tablet Take 1 tablet (50 mg total) by mouth daily. Take with or immediately following a meal. 30 tablet 5  . warfarin (COUMADIN) 2.5 MG tablet Take 2.5 mg by mouth daily.    . potassium chloride SA (KLOR-CON M20) 20 MEQ tablet Take 1 tablet (20 mEq total) by mouth daily. (Patient not taking: Reported on 05/07/2014) 30 tablet 6   No current facility-administered medications for this visit.    Allergies:   Penicillins and Sulfa antibiotics    Social History:  The patient  reports that she has never smoked. She does not have any smokeless tobacco history on file. She reports that she does not drink alcohol or use illicit drugs.   Family History:  The patient's family history includes CAD in her father and mother; Cirrhosis in her brother; Heart attack in her sister;  Hypertension in her father.    ROS:  Please see the history of present illness.   Otherwise, review of systems are positive for none.   All other systems are reviewed and negative.    PHYSICAL EXAM: VS:  BP 150/82 mmHg  Pulse 93  Ht  (1.499 m)  Wt 197 lb (89.359 kg)  BMI 39.77 kg/m2  SpO2 99% , BMI Body mass index is 39.77 kg/(m^2). GEN: Well nourished, well developed, in no acute distress HEENT: normal Neck: no JVD, carotid bruits, or masses Cardiac: RRR; no murmurs, rubs, or gallops,no edema  Respiratory:  clear to auscultation bilaterally, normal work of breathing GI: soft, nontender, nondistended, + BS MS: no deformity or atrophy Skin: warm and dry, no rash Neuro:  Strength and sensation are intact Psych: euthymic mood, full  affect   EKG:  EKG was ordered today showing NSR at 93bpm with nonspecific ST abnormality    Recent Labs: 12/10/2013: Pro B Natriuretic peptide (BNP) 329.0* 12/18/2013: BUN 20; Creatinine 1.1; Potassium 4.0; Sodium 134*    Lipid Panel No results found for: CHOL, TRIG, HDL, CHOLHDL, VLDL, LDLCALC, LDLDIRECT    Wt Readings from Last 3 Encounters:  05/07/14 197 lb (89.359 kg)  12/10/13 191 lb (86.637 kg)  01/19/13 182 lb (82.555 kg)       ASSESSMENT AND PLAN:  1. Atrial fibrilation maintaining NSR but with an episode of palpitations the other night that felt like her prior afib - continue metoprolol and warfarin  - I will get an event monitor to assess 2. Chronic anticoagulation for MVR and PAF - followed by her PCP 3. HTN - borderline controlled - continue metoprolol/amlodipine 4. Rheumatic heart disease s/p mechanical MVR on ASA and warfarin 5. Moderate to severe AS - increased mean AV gradient compared to a year ago.  She is now having increased DOE - repeat 2D echo to reassess AS and mild pulmonary HTN in given increase in AV gradient on last echo in fall and now worsening SOB.  I suspect that her AS has worsened.  If echo shows progression then she will need right and left heart cath and consider referral for possible TAVR since she has already had open heart procedure  6. Chronic diastolic CHF- with some SOB with exertion. She has chronic LE edema.  I will check a BNP given underlying increase in SOB.   - continue Lasix 7. Mild pulmonary HTN 8. Normal coronary arteries by cath 2011 9. Chronic SOB - check BNP    Current medicines are reviewed at length with the patient today.  The patient does not have concerns regarding medicines.  The following changes have been made:  no change  Labs/ tests ordered today include: BMET, BNP, 2D echo and event monitor     Disposition:   FU with me after echo Harlon Flor, MD  05/07/2014 3:14 PM    [facility] [address] [place], [place], [place]  [address] Phone: [phone]; Fax: [phone]

## 2014-05-10 ENCOUNTER — Telehealth: Payer: Self-pay | Admitting: Cardiology

## 2014-05-10 DIAGNOSIS — R0602 Shortness of breath: Secondary | ICD-10-CM

## 2014-05-10 DIAGNOSIS — I10 Essential (primary) hypertension: Secondary | ICD-10-CM

## 2014-05-10 MED ORDER — FUROSEMIDE 40 MG PO TABS: 40.0000 mg | ORAL_TABLET | Freq: Two times a day (BID) | ORAL | Status: DC

## 2014-05-10 NOTE — Telephone Encounter (Signed)
New message      Pt said she was to call and take to Orpha BurKaty this am

## 2014-05-10 NOTE — Telephone Encounter (Signed)
Patient st she tolerated the Lasix 40 mg BID well, as she took it with food as instructed Friday. Lab appointment scheduled for Wednesday.

## 2014-05-10 NOTE — Telephone Encounter (Signed)
-----   Message from Quintella Reichertraci R Turner, MD sent at 05/07/2014  5:39 PM EST ----- BNP elevated - please increase Lasix to 40mg  BID and recheck BMET and BNP in 1 week

## 2014-05-12 ENCOUNTER — Ambulatory Visit: Payer: Self-pay | Admitting: Physician Assistant

## 2014-05-12 ENCOUNTER — Encounter: Payer: Self-pay | Admitting: *Deleted

## 2014-05-12 ENCOUNTER — Ambulatory Visit (HOSPITAL_COMMUNITY): Payer: 59 | Attending: Internal Medicine | Admitting: Radiology

## 2014-05-12 ENCOUNTER — Other Ambulatory Visit (INDEPENDENT_AMBULATORY_CARE_PROVIDER_SITE_OTHER): Payer: 59 | Admitting: *Deleted

## 2014-05-12 ENCOUNTER — Encounter (INDEPENDENT_AMBULATORY_CARE_PROVIDER_SITE_OTHER): Payer: 59

## 2014-05-12 DIAGNOSIS — I059 Rheumatic mitral valve disease, unspecified: Secondary | ICD-10-CM | POA: Diagnosis not present

## 2014-05-12 DIAGNOSIS — I480 Paroxysmal atrial fibrillation: Secondary | ICD-10-CM

## 2014-05-12 DIAGNOSIS — I10 Essential (primary) hypertension: Secondary | ICD-10-CM

## 2014-05-12 DIAGNOSIS — I350 Nonrheumatic aortic (valve) stenosis: Secondary | ICD-10-CM | POA: Diagnosis not present

## 2014-05-12 DIAGNOSIS — R0602 Shortness of breath: Secondary | ICD-10-CM

## 2014-05-12 LAB — BASIC METABOLIC PANEL
BUN: 22 mg/dL (ref 6–23)
CHLORIDE: 98 meq/L (ref 96–112)
CO2: 30 mEq/L (ref 19–32)
Calcium: 9.3 mg/dL (ref 8.4–10.5)
Creatinine, Ser: 1.08 mg/dL (ref 0.40–1.20)
GFR: 54.29 mL/min — ABNORMAL LOW (ref >60.00)
Glucose, Bld: 295 mg/dL — ABNORMAL HIGH (ref 70–99)
Potassium: 3.7 mEq/L (ref 3.5–5.1)
SODIUM: 134 meq/L — AB (ref 135–145)

## 2014-05-12 LAB — BRAIN NATRIURETIC PEPTIDE: PRO B NATRI PEPTIDE: 406 pg/mL — AB (ref 0.0–100.0)

## 2014-05-12 NOTE — Progress Notes (Signed)
Echocardiogram performed.  

## 2014-05-12 NOTE — Progress Notes (Signed)
Patient ID: Jamie Howell, female   DOB: 05/30/1950, 64 y.o.   MRN: [id number] [employer] 30 day cardiac event monitor applied to patient.

## 2014-05-12 NOTE — Addendum Note (Signed)
Addended by: Tonita PhoenixBOWDEN, ROBIN K on: 05/12/2014 08:28 AM   Modules accepted: Orders

## 2014-05-13 ENCOUNTER — Encounter: Payer: Self-pay | Admitting: Cardiology

## 2014-05-13 NOTE — Telephone Encounter (Signed)
This encounter was created in error - please disregard.

## 2014-05-13 NOTE — Telephone Encounter (Signed)
New message ° ° °Patient returning call back to nurse.  °

## 2014-05-18 ENCOUNTER — Encounter: Payer: Self-pay | Admitting: Cardiovascular Disease

## 2014-05-18 ENCOUNTER — Ambulatory Visit (INDEPENDENT_AMBULATORY_CARE_PROVIDER_SITE_OTHER): Payer: 59 | Admitting: Cardiovascular Disease

## 2014-05-18 VITALS — BP 128/62 | HR 83 | Ht 59.0 in | Wt 189.0 lb

## 2014-05-18 DIAGNOSIS — I350 Nonrheumatic aortic (valve) stenosis: Secondary | ICD-10-CM

## 2014-05-18 DIAGNOSIS — D696 Thrombocytopenia, unspecified: Secondary | ICD-10-CM

## 2014-05-18 NOTE — Patient Instructions (Signed)
Your physician recommends that you return for lab work: BMP and CBC  You have been referred to Hematology for further evaluation of Thrombocytopenia.   Your physician has requested that you have a cardiac catheterization. Cardiac catheterization is used to diagnose and/or treat various heart conditions. Doctors may recommend this procedure for a number of different reasons. The most common reason is to evaluate chest pain. Chest pain can be a symptom of coronary artery disease (CAD), and cardiac catheterization can show whether plaque is narrowing or blocking your heart's arteries. This procedure is also used to evaluate the valves, as well as measure the blood flow and oxygen levels in different parts of your heart. For further information please visit [email or web address]. Please follow instruction sheet, as given.

## 2014-05-18 NOTE — Progress Notes (Signed)
Cardiology Office Note   Date:  05/20/2014   ID:  Jamie Howell C Hunt, DOB 09/30/1950, MRN [id number]  PCP:  Londell MohPHARR,[name], MD  Cardiologist:  Tonny BollmanMichael Bexton Haak, MD    Chief Complaint  Patient presents with  . aortic stenosis     History of Present Illness: Jamie Howell C Ground is a 64 y.o. female who presents for evaluation of severe aortic stenosis.   The patient has underlying rheumatic heart disease and underwent mechanical mitral valve replacement in 1993 with a mechanical bileaflet St Jude prosthesis by Dr Andrey CampanileWilson. She has done very well since her surgery. Approximately 16 years ago she required burr hole surgery for decompression of a subdural hematoma. Otherwise she denies any bleeding problems and has tolerated long-term warfarin well. Until the last few months the patient has not been limited by shortness of breath, chest pain, or lightheadedness.  She describes an episode about 3 weeks ago when she felt heart heart pulsating in her back which is very unusual for her. She was seen by Dr Mayford Knifeurner and an echo and event monitor were done. She is currently wearing the event monitor and the echo demonstrated progression of aortic stenosis, now in the severe range with a mean gradient of 40 mmHg. The palpitations have not recurred as of yet. The patient also complains of shortness of breath with low level exertion over the last few months. Feels like she cannot carry anything or walk even moderate distance without experiencing dyspnea. Denies chest pain or pressure. Occasionally feels 'dizzy-headed' but no syncope or near-syncope. The patient's lasix was recently increased and she has lost 8# by our scales over the past 10 days.  The patient lives [place] her [place] in [place]. She works at the [employer] x 15 years. She is a lifelong nonsmoker. She has never been married.   She reports a history of thrombocytopenia and states at the time of her last heart catheterization she  required hematology evaluation and platelet transfusion. Splenic sequestration was suspected. Cardiac catheterization in 2011 demonstrated normal coronary arteries.   Past Medical History  Diagnosis Date  . HTN (hypertension)   . Thyrotoxicosis      without mention of goiter or other cause, without mention of thyrotoxic crisis or storm  . Hypothyroidism   . 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 Atrial fibrillation   . Atrial flutter   . Heart disease   . Hematoma   . Subdural hematoma   . Pulmonary HTN     mild  . OA (ocular albinism)   . Aortic valve stenosis     moderate aortic valve stenosis with moderate aortic insufficiency, stable MVR, trivial PR, mild TR, severe LAE, grade II-III diastolic dysfunction,mild pulmonary HTN , EF 50% by echo 12/2012    Past Surgical History  Procedure Laterality Date  . Total abdominal hysterectomy    . Mitral valve replacement    . Burr hole    . Mechanical aortic valve replacement      Current Outpatient Prescriptions  Medication Sig Dispense Refill  . amLODipine (NORVASC) 2.5 MG tablet Take 1 tablet (2.5 mg total) by mouth daily. 30 tablet 5  . aspirin EC 81 MG tablet Take 1 tablet (81 mg total) by mouth daily.    . Calcium Carb-Cholecalciferol (CALCIUM 1000 + D PO) Take 1,000 mg by mouth daily.    . cyclobenzaprine (FLEXERIL) 10 MG tablet Take 10 mg by mouth  3 (three) times daily as needed for muscle spasms. 1/2 tablet    . furosemide (LASIX) 40 MG tablet Take 1 tablet (40 mg total) by mouth 2 (two) times daily.    Marland Kitchen levothyroxine (SYNTHROID, LEVOTHROID) 150 MCG tablet Take 150 mcg by mouth daily before breakfast.    . metoprolol succinate (TOPROL-XL) 50 MG 24 hr tablet Take 1 tablet (50 mg total) by mouth daily. Take with or immediately following a meal. 30 tablet 5  . potassium chloride (K-DUR) 10 MEQ tablet Take 10 mEq by mouth daily.    Marland Kitchen warfarin (COUMADIN) 2.5 MG tablet Take 2.5 mg by mouth  daily.     No current facility-administered medications for this visit.    Allergies:   Penicillins and Sulfa antibiotics   Social History:  The patient  reports that she has never smoked. She does not have any smokeless tobacco history on file. She reports that she does not drink alcohol or use illicit drugs.   Family History:  The patient's  family history includes CAD in her father and mother; Cirrhosis in her brother; Heart attack in her sister; Hypertension in her father.    ROS:  Please see the history of present illness.  Otherwise, review of systems is positive for PND, exertional dyspnea, palpitations, snoring, wheezing, and anxiety.  All other systems are reviewed and negative.   PHYSICAL EXAM: VS:  BP 128/62 mmHg  Pulse 83  Ht  (1.499 m)  Wt 189 lb (85.73 kg)  BMI 38.15 kg/m2  SpO2 99% , BMI Body mass index is 38.15 kg/(m^2). GEN: Well nourished, well developed, in no acute distress HEENT: normal except for poor dentition Neck: no JVD, carotid bruits, or masses Cardiac: RRR with crisp mechanical S1 and a grade 3/6 harsh late peaking systolic murmur at the RUSB                1+ bilateral pretibial edema Respiratory:  clear to auscultation bilaterally, normal work of breathing GI: soft, nontender, nondistended, + BS MS: no deformity or atrophy Skin: warm and dry, no rash Neuro:  Strength and sensation are intact Psych: euthymic mood, full affect  EKG:  EKG is not ordered today.  Recent Labs: 05/12/2014: Pro B Natriuretic peptide (BNP) 406.0* 05/20/2014: BUN 24*; Creatinine 1.13; Hemoglobin 10.3*; Platelets 59.0*; Potassium 3.7; Sodium 134*   Lipid Panel  No results found for: CHOL, TRIG, HDL, CHOLHDL, VLDL, LDLCALC, LDLDIRECT    Wt Readings from Last 3 Encounters:  05/18/14 189 lb (85.73 kg)  05/07/14 197 lb (89.359 kg)  12/10/13 191 lb (86.637 kg)     Cardiac Studies Reviewed: 2D Echo: Study Conclusions  - Left ventricle: The cavity size was normal.  There was moderate concentric hypertrophy. Systolic function was mildly reduced. The estimated ejection fraction was in the range of 45% to 50%. Inferior hypokinesis. The study is not technically sufficient to allow evaluation of LV diastolic function. - Aortic valve: Heavily calcified with restricted leaflet motion. Severe aortic stenosis - peak and mean gradients of 60 mmHG and 40 mmHG, respectivley. Based on an LVOT diameter of 1.9 cm, the calculated AVA is 0.6 cm2. There is mild to moderate regurgitation. Regurgitation pressure half-time: 292 ms. - Mitral valve: Mechanical tilting bileaflet valve. Peak and mean gradients of 15 and 5 mmHg. Normal leaflet motion, no evidence for obstruction or paravalvular leak. There was mild regurgitation. - Left atrium: Severely dilated at 65 ml/m2. - Right ventricle: The cavity size was mildly dilated. -  Right atrium: Moderately dilated at 24 cm2. - Atrial septum: No defect or patent foramen ovale was identified. - Tricuspid valve: There was moderate regurgitation. - Pulmonary arteries: PA peak pressure: 54 mm Hg (S). - Systemic veins: The IVC is <2.1 cm, but does not collapse >50%, suggesting an elevated RA pressure of 8 mmHg.  Impressions:  - Compared to the prior study in 12/2013, there is now inferior hypokinesis with EF around 45%, normally functioning mitral valve prosthesis. There is severe aortic stenosis with heavy calcification and AVA around 0.6 cm2 - mean gradient of 40 mmHg. There is moderate AI and moderate TR, RVSP is 54 mmHg, severe LAE and moderate RAE.  ASSESSMENT AND PLAN: 1. Stage D Severe Symptomatic Aortic Stenosis 2. S/P mechanical mitral valve replacement 3. Thrombocytopenia, chronic 4. Acute on chronic diastolic heart failure, secondary to valvular heart disease, NYHA Functional Class III 5. Poor dentition 6. Essential Hypertension  Complex patient with multiple comorbid medical  conditions but still very functional. I have personally reviewed her echo images and agree that she clearly has severe aortic stenosis based on echo criteria.  I have reviewed the natural history of aortic stenosis with the patient.. We have discussed the limitations of medical therapy and the poor prognosis associated with symptomatic aortic stenosis. We have also reviewed potential treatment options, including palliative medical therapy, conventional surgical aortic valve replacement, and transcatheter aortic valve replacement. We discussed treatment options in the context of this patient's specific comorbid medical conditions.   The patient wishes to proceed with evaluation for aortic valve replacement. It's been 5 years since her last cardiac catheterization so this will have to be repeated. I have reviewed the risks, indications, and alternatives to cardiac catheterization. Risks include but are not limited to bleeding, infection, vascular injury, stroke, myocardial infection, arrhythmia, kidney injury, radiation-related injury in the case of prolonged fluoroscopy use, emergency cardiac surgery, and death. The patient understands the risks of serious complication is low (<1%). The patient will requiring Lovenox bridging with a mechanical mitral prosthesis and PAF. In addition, the patient has significant thrombocytopenia and labs have been drawn to evaluate the degree of this. She will require hematology evaluation prior to cardiac catheterization, but I suspect we'll be able to move forward with cardiac cath using radial access to minimize bleeding.   After catheterization, she will require formal cardiac surgical evaluation. She also will have to undergo dental evaluation. She hasn't seen a dentist in many years.   Current medicines are reviewed with the patient today.  The patient does not have concerns regarding medicines.  The following changes have been made:  no change  Labs/ tests ordered  today include:   Orders Placed This Encounter  Procedures  . Basic Metabolic Panel (BMET)  . CBC w/Diff  . Ambulatory referral to Hematology   Time spent conducting this consultation = 75 minutes, greater than half of that time was spent in direct patient counseling as outlined above.   Signed, Tonny Bollman, MD  05/20/2014 9:06 PM    [facility] [address] [place], [place], [place]  [address] Phone: [phone]; Fax: [phone]

## 2014-05-20 ENCOUNTER — Encounter: Payer: Self-pay | Admitting: Cardiovascular Disease

## 2014-05-20 ENCOUNTER — Telehealth: Payer: Self-pay | Admitting: Hematology

## 2014-05-20 ENCOUNTER — Other Ambulatory Visit (INDEPENDENT_AMBULATORY_CARE_PROVIDER_SITE_OTHER): Payer: 59 | Admitting: *Deleted

## 2014-05-20 DIAGNOSIS — D696 Thrombocytopenia, unspecified: Secondary | ICD-10-CM

## 2014-05-20 DIAGNOSIS — I350 Nonrheumatic aortic (valve) stenosis: Secondary | ICD-10-CM

## 2014-05-20 LAB — CBC WITH DIFFERENTIAL/PLATELET
BASOS ABS: 0 10*3/uL (ref 0.0–0.1)
Basophils Relative: 0.8 % (ref 0.0–3.0)
EOS ABS: 0.1 10*3/uL (ref 0.0–0.7)
Eosinophils Relative: 1.9 % (ref 0.0–5.0)
HCT: 30.5 % — ABNORMAL LOW (ref 36.0–46.0)
Hemoglobin: 10.3 g/dL — ABNORMAL LOW (ref 12.0–15.0)
LYMPHS PCT: 12.9 % (ref 12.0–46.0)
Lymphs Abs: 0.5 10*3/uL — ABNORMAL LOW (ref 0.7–4.0)
MCHC: 33.8 g/dL (ref 30.0–36.0)
MCV: 82 fl (ref 78.0–100.0)
MONO ABS: 0.3 10*3/uL (ref 0.1–1.0)
Monocytes Relative: 9.1 % (ref 3.0–12.0)
Neutro Abs: 2.8 10*3/uL (ref 1.4–7.7)
Neutrophils Relative %: 75.3 % (ref 43.0–77.0)
Platelets: 59 10*3/uL — ABNORMAL LOW (ref 150.0–400.0)
RBC: 3.71 Mil/uL — ABNORMAL LOW (ref 3.87–5.11)
RDW: 17.5 % — ABNORMAL HIGH (ref 11.5–15.5)
WBC: 3.7 10*3/uL — ABNORMAL LOW (ref 4.0–10.5)

## 2014-05-20 LAB — BASIC METABOLIC PANEL
BUN: 24 mg/dL — AB (ref 6–23)
CO2: 31 mEq/L (ref 19–32)
Calcium: 9.3 mg/dL (ref 8.4–10.5)
Chloride: 99 mEq/L (ref 96–112)
Creatinine, Ser: 1.13 mg/dL (ref 0.40–1.20)
GFR: 51.52 mL/min — ABNORMAL LOW (ref >60.00)
Glucose, Bld: 195 mg/dL — ABNORMAL HIGH (ref 70–99)
POTASSIUM: 3.7 meq/L (ref 3.5–5.1)
Sodium: 134 mEq/L — ABNORMAL LOW (ref 135–145)

## 2014-05-20 NOTE — Telephone Encounter (Signed)
pt confirmed appt 06/08/14 at 1pm w/Feng Dx: thrombocytopenia, aortic stenosis Referring Dr. Darrold SpanLivesay

## 2014-05-20 NOTE — Addendum Note (Signed)
Addended by: Tonita PhoenixBOWDEN, Chetan Mehring K on: 05/20/2014 07:47 AM   Modules accepted: Orders

## 2014-06-04 ENCOUNTER — Ambulatory Visit: Payer: 59

## 2014-06-04 ENCOUNTER — Ambulatory Visit: Payer: 59 | Admitting: Hematology

## 2014-06-08 ENCOUNTER — Telehealth: Payer: Self-pay | Admitting: Hematology

## 2014-06-08 ENCOUNTER — Ambulatory Visit: Payer: 59

## 2014-06-08 ENCOUNTER — Ambulatory Visit (HOSPITAL_BASED_OUTPATIENT_CLINIC_OR_DEPARTMENT_OTHER): Payer: 59 | Admitting: Hematology

## 2014-06-08 ENCOUNTER — Encounter: Payer: Self-pay | Admitting: Hematology

## 2014-06-08 VITALS — BP 131/49 | HR 96 | Temp 97.8°F | Resp 19 | Ht 59.0 in | Wt 185.3 lb

## 2014-06-08 DIAGNOSIS — D649 Anemia, unspecified: Secondary | ICD-10-CM

## 2014-06-08 DIAGNOSIS — D696 Thrombocytopenia, unspecified: Secondary | ICD-10-CM

## 2014-06-08 LAB — HEPATITIS C ANTIBODY: HCV Ab: NEGATIVE

## 2014-06-08 LAB — CBC & DIFF AND RETIC
BASO%: 0.5 % (ref 0.0–2.0)
BASOS ABS: 0 10*3/uL (ref 0.0–0.1)
EOS ABS: 0.1 10*3/uL (ref 0.0–0.5)
EOS%: 2.7 % (ref 0.0–7.0)
HEMATOCRIT: 33.5 % — AB (ref 34.8–46.6)
HEMOGLOBIN: 10.7 g/dL — AB (ref 11.6–15.9)
Immature Retic Fract: 8.2 % (ref 1.60–10.00)
LYMPH#: 0.8 10*3/uL — AB (ref 0.9–3.3)
LYMPH%: 17.1 % (ref 14.0–49.7)
MCH: 26.6 pg (ref 25.1–34.0)
MCHC: 31.9 g/dL (ref 31.5–36.0)
MCV: 83.1 fL (ref 79.5–101.0)
MONO#: 0.3 10*3/uL (ref 0.1–0.9)
MONO%: 7.7 % (ref 0.0–14.0)
NEUT%: 72 % (ref 38.4–76.8)
NEUTROS ABS: 3.2 10*3/uL (ref 1.5–6.5)
PLATELETS: 72 10*3/uL — AB (ref 145–400)
RBC: 4.03 10*6/uL (ref 3.70–5.45)
RDW: 16.1 % — ABNORMAL HIGH (ref 11.2–14.5)
RETIC %: 1.83 % (ref 0.70–2.10)
Retic Ct Abs: 73.75 10*3/uL (ref 33.70–90.70)
WBC: 4.4 10*3/uL (ref 3.9–10.3)

## 2014-06-08 LAB — COMPREHENSIVE METABOLIC PANEL (CC13)
ALBUMIN: 3.3 g/dL — AB (ref 3.5–5.0)
ALT: 16 U/L (ref 0–55)
AST: 29 U/L (ref 5–34)
Alkaline Phosphatase: 94 U/L (ref 40–150)
Anion Gap: 10 mEq/L (ref 3–11)
BUN: 25.3 mg/dL (ref 7.0–26.0)
CALCIUM: 8.8 mg/dL (ref 8.4–10.4)
CHLORIDE: 98 meq/L (ref 98–109)
CO2: 28 mEq/L (ref 22–29)
Creatinine: 1.2 mg/dL — ABNORMAL HIGH (ref 0.6–1.1)
EGFR: 46 mL/min/{1.73_m2} — AB (ref >90)
Glucose: 254 mg/dl — ABNORMAL HIGH (ref 70–140)
Potassium: 3.7 mEq/L (ref 3.5–5.1)
Sodium: 136 mEq/L (ref 136–145)
TOTAL PROTEIN: 7.2 g/dL (ref 6.4–8.3)
Total Bilirubin: 2.05 mg/dL — ABNORMAL HIGH (ref 0.20–1.20)

## 2014-06-08 LAB — HIV ANTIBODY (ROUTINE TESTING W REFLEX): HIV 1&2 Ab, 4th Generation: NONREACTIVE

## 2014-06-08 LAB — FERRITIN CHCC: Ferritin: 38 ng/ml (ref 9–269)

## 2014-06-08 LAB — LACTATE DEHYDROGENASE (CC13): LDH: 376 U/L — ABNORMAL HIGH (ref 125–245)

## 2014-06-08 LAB — IRON AND TIBC CHCC
%SAT: 23 % (ref 21–57)
Iron: 93 ug/dL (ref 41–142)
TIBC: 408 ug/dL (ref 236–444)
UIBC: 315 ug/dL (ref 120–384)

## 2014-06-08 LAB — MORPHOLOGY: PLT EST: DECREASED

## 2014-06-08 LAB — HEPATITIS B SURFACE ANTIGEN: Hepatitis B Surface Ag: NEGATIVE

## 2014-06-08 LAB — HEPATITIS B SURFACE ANTIBODY,QUALITATIVE: Hep B S Ab: NEGATIVE

## 2014-06-08 NOTE — Telephone Encounter (Signed)
Pt confirmed labs/ov per 03/08 POF, gave pt AVS..... KJ °

## 2014-06-08 NOTE — Progress Notes (Signed)
[place]  [phone] [phone]) [place] consult Note   Patient Care Team: Deland Pretty, MD as PCP - General (Internal Medicine) 06/08/2014  CHIEF COMPLAINTS/PURPOSE OF CONSULTATION:  Thrombocytopenia   HISTORY OF PRESENTING ILLNESS:  Jamie Howell 64 y.o. female with past medical history of rheumatic heart bowel disease, status post module bile replacement, hypertension, atrial fibrillation, CHF, is here because of thrombocytopenia.  She had rheumatic fever at age of 27, and was found to have heart murmur at age of 74, and required a mechanical mitrial valve replacement in 70 (age of 6). She was recently seen by cardiologist Dr. Burt Knack for worsening aortic stenosis, and is planning to have a cardiac cath and pelvic valve replacement in the near future. She was referred by Dr. Burt Knack for evaluation of some cytopenia before her heart procedures.  She was found to have abnormal CBC from 2011 with platelet around 60-80K, when she was admitted for cardiac catheterization. She was seen by my colleague Dr. Abelina Bachelor C at that time, and had extensive workup including a bone marrow biopsy, which showed hypercellular bone marrow with normal cytogenetics, ultrasound of abdomen showed enlarged spleen. 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 She did not require any specific treatment for her thrombocytopenia, and did not follow-up with [place].  She has had easy bruise for the past 10 years, she has been on Coumadin since her heart surgery, and was also put on baby aspirin several months ago by her cardiologist. No other bleeding such as epistaxis, hematuria or hematochezia.She denies recent chest pain on exertion, shortness of breath on minimal exertion, pre-syncopal episodes, or palpitations. . She never donated blood and received blood transfusion before her hyersrectomy in 1999.   She works full-time for the [employer], she is single lives with her younger son. She doesn't have shortness breast on mild-to-moderate exertion. No chest pain.  She is able to function well at work and home.  MEDICAL HISTORY:  Past Medical History  Diagnosis Date  . HTN (hypertension)   . Thyrotoxicosis      without mention of goiter or other cause, without mention of thyrotoxic crisis or storm  . Hypothyroidism   . 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 Atrial fibrillation   . Atrial flutter   . Heart disease   . Hematoma   . Subdural hematoma   . Pulmonary HTN     mild  . OA (ocular albinism)   . Aortic valve stenosis     moderate aortic valve stenosis with moderate aortic insufficiency, stable MVR, trivial PR, mild TR, severe LAE, grade II-III diastolic dysfunction,mild pulmonary HTN , EF 50% by echo 12/2012    SURGICAL HISTORY: Past Surgical History  Procedure Laterality Date  . Total abdominal hysterectomy    . Mitral valve replacement    . Burr hole    . Mechanical aortic valve replacement      SOCIAL HISTORY: History   Social History  . Marital Status: Single    Spouse Name: N/A  . Number of Children: 2, 43 and 27 sons   . Years of Education: N/A   Occupational History  . She works for [employer]  . Smoking status: Never Smoker   . Smokeless tobacco: Not on file  . Alcohol Use: No  . Drug Use: No  .  Sexual Activity: Not on file   Other Topics Concern  . Not on file   Social History Narrative    FAMILY HISTORY: Family History  Problem Relation Age of Onset  . CAD Father   . Hypertension Father   . CAD Mother   . Cirrhosis Brother   . Heart attack Sister     ALLERGIES:  is allergic to penicillins and sulfa antibiotics.  MEDICATIONS:  Current Outpatient Prescriptions  Medication Sig Dispense Refill  . amLODipine (NORVASC) 2.5 MG tablet Take 1 tablet (2.5 mg total) by mouth daily. 30 tablet 5    . aspirin EC 81 MG tablet Take 1 tablet (81 mg total) by mouth daily.    . Calcium Carb-Cholecalciferol (CALCIUM 1000 + D PO) Take 1,000 mg by mouth daily.    . cyclobenzaprine (FLEXERIL) 10 MG tablet Take 10 mg by mouth 3 (three) times daily as needed for muscle spasms. 1/2 tablet    . furosemide (LASIX) 40 MG tablet Take 1 tablet (40 mg total) by mouth 2 (two) times daily.    Marland Kitchen levothyroxine (SYNTHROID, LEVOTHROID) 150 MCG tablet Take 150 mcg by mouth daily before breakfast.    . metoprolol succinate (TOPROL-XL) 50 MG 24 hr tablet Take 1 tablet (50 mg total) by mouth daily. Take with or immediately following a meal. 30 tablet 5  . potassium chloride (K-DUR) 10 MEQ tablet Take 10 mEq by mouth daily.    Marland Kitchen warfarin (COUMADIN) 2.5 MG tablet Take 2.5 mg by mouth daily. Take 35m on  Weds and  Fridays;  Rest of week  2.556m     No current facility-administered medications for this visit.    REVIEW OF SYSTEMS:   Constitutional: Denies fevers, chills or abnormal night sweats Eyes: Denies blurriness of vision, double vision or watery eyes Ears, nose, mouth, throat, and face: Denies mucositis or sore throat Respiratory: Denies cough, dyspnea or wheezes Cardiovascular: Denies palpitation, chest discomfort or lower extremity swelling Gastrointestinal:  Denies nausea, heartburn or change in bowel habits Skin: Denies abnormal skin rashes Lymphatics: Denies new lymphadenopathy or easy bruising Neurological:Denies numbness, tingling or new weaknesses Behavioral/Psych: Mood is stable, no new changes  All other systems were reviewed with the patient and are negative.  PHYSICAL EXAMINATION: ECOG PERFORMANCE STATUS: 1 - Symptomatic but completely ambulatory  Filed Vitals:   06/08/14 1250  BP: 131/49  Pulse: 96  Temp: 97.8 F (36.6 C)  Resp: 19   Filed Weights   06/08/14 1250  Weight: 185 lb 4.8 oz (84.052 kg)    GENERAL:alert, no distress and comfortable SKIN: skin color, texture, turgor are  normal, no rashes or significant lesions, there are a few ecchymosis on her arms. EYES: normal, conjunctiva are pink and non-injected, sclera clear OROPHARYNX:no exudate, no erythema and lips, buccal mucosa, and tongue normal  NECK: supple, thyroid normal size, non-tender, without nodularity LYMPH:  no palpable lymphadenopathy in the cervical, axillary or inguinal LUNGS: clear to auscultation and percussion with normal breathing effort HEART: regular rate & rhythm and no murmurs and no lower extremity edema ABDOMEN:abdomen soft, non-tender and normal bowel sounds Musculoskeletal:no cyanosis of digits and no clubbing  PSYCH: alert & oriented x 3 with fluent speech NEURO: no focal motor/sensory deficits  LABORATORY DATA:  I have reviewed the data as listed CBC Latest Ref Rng 05/20/2014 09/19/2009 08/02/2009  WBC 4.0 - 10.5 K/uL 3.7(L) 5.5 5.0  Hemoglobin 12.0 - 15.0 g/dL 10.3(L) 11.6 12.4  Hematocrit 36.0 - 46.0 % 30.5(L) 32.8(L) 34.4(L)  Platelets 150.0 - 400.0 K/uL 59.0(L) 77(L) 62(L)    CMP Latest Ref Rng 05/20/2014 05/12/2014 05/07/2014  Glucose 70 - 99 mg/dL 195(H) 295(H) 154(H)  BUN 6 - 23 mg/dL 24(H) 22 26(H)  Creatinine 0.40 - 1.20 mg/dL 1.13 1.08 1.06  Sodium 135 - 145 mEq/L 134(L) 134(L) 135  Potassium 3.5 - 5.1 mEq/L 3.7 3.7 4.0  Chloride 96 - 112 mEq/L 99 98 102  CO2 19 - 32 mEq/L 31 30 30   Calcium 8.4 - 10.5 mg/dL 9.3 9.3 9.1  Total Protein 6.0 - 8.3 g/dL - - -  Total Bilirubin 0.3 - 1.2 mg/dL - - -  Alkaline Phos 39 - 117 U/L - - -  AST 0 - 37 U/L - - -  ALT 0 - 35 U/L - - -     RADIOGRAPHIC STUDIES: I have personally reviewed the radiological images as listed and agreed with the findings in the report.  US ABDOMEN 07/09/2009 IMPRESSION: Moderate splenomegaly.   ASSESSMENT & PLAN:  64 year old Caucasian female with past medical history of rheumatic heart valve disease, status post mitral valve replacement, hypertension, atrial fibrillation, CHF, is here because of  thrombocytopenia.  1. 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 She does have a mechanical mitral valve and severe aortic valve stenosis. -Giving the chronic but stable course of her moderate some cytopenia, I think this is likely related to her hypersplenism and valvular disease. Less likely ITP, medication induced thrombocytopenia, or primary marrow disease. -I think the etiology of spleen medically is likely secondary to congestive heart failure. I'll also check hepatitis B, hepatitis C, and HIV, and repeat a ultrasound of abdomen. -I discussed her plate count probably would be adequate for cardiac cath and none open cardiac valvular replacement surgery. She may require platelet transfusion prior or during the surgery. -We also discussed the alternative option of splenectomy, which may significantly improve her platelet count. This can be considered after her cardiac surgery, if her platelet count drops significantly below 50K. -I'll discuss with her cardiologist, to see if aspirin is truly necessary. If possible I would like her to be off aspirin to reduce the risk of bleeding.  2. normocyticanemia -she has been having stable mild anemia with hemoglobin around 10-11 over the past several years -this could be related to her hypersplenism or anemia of chronic disease, we'll ruled out iron deficiency anemia. -I'll check her iron study, ferritin, LDH.  3. She will continue follow-up with her primary care physician and cardiologist for her heart disease.   Follow-up: I'll see her back in 4 months after her cardiac surgery.   All questions were answered. The patient knows to call the clinic with any problems, questions or concerns. I spent 30 minutes counseling the patient face to face. The total time spent in the appointment was 40 minutes and more than  50% was on counseling.     Truitt Merle, MD 06/08/2014 1:19 PM

## 2014-06-10 ENCOUNTER — Ambulatory Visit: Payer: Self-pay | Admitting: Cardiology

## 2014-06-16 ENCOUNTER — Telehealth: Payer: Self-pay | Admitting: Cardiology

## 2014-06-16 NOTE — Telephone Encounter (Signed)
Please let patient know that heart monitor showed NSR with occasional PVC's and PAC's and nonsustained atrial tachycardia which are benign but no PAF.  Please find out if she is still bothered by palpitations

## 2014-06-17 ENCOUNTER — Telehealth: Payer: Self-pay | Admitting: Cardiovascular Disease

## 2014-06-17 NOTE — Telephone Encounter (Signed)
New message        Pt returning call from nurse

## 2014-06-17 NOTE — Telephone Encounter (Signed)
I spoke with the pt and she would like to have cardiac catheterization performed on 06/28/14 with Dr Excell Seltzerooper.  I will schedule procedure and we will arrange lovenox bridging through our anticoagulation clinic.

## 2014-06-18 NOTE — Telephone Encounter (Signed)
Follow up      Calling to see what time pt is to be at the hosp for her procedure.  Also need info on lovenox bridge

## 2014-06-18 NOTE — Telephone Encounter (Signed)
I spoke with the pt and made her aware of pre-cath instructions.  The pt will take her last dose of Warfarin 06/22/14 and then will require lovenox bridging through Dr Carolee RotaPharr's office.  Dr Carolee RotaPharr's office is currently closed so I will touch base with the office next week.  Pt verbalized understanding of instructions.

## 2014-06-21 NOTE — Telephone Encounter (Signed)
I spoke with Clydie [facility]Karen at [facility] and she will contact the pt in regards to lovenox bridge in preparation for 06/28/14 cardiac cath.

## 2014-06-21 NOTE — Telephone Encounter (Signed)
Left message to call back  

## 2014-06-22 ENCOUNTER — Telehealth: Payer: Self-pay | Admitting: Cardiology

## 2014-06-22 NOTE — Telephone Encounter (Signed)
This encounter was created in error - please disregard.

## 2014-06-22 NOTE — Telephone Encounter (Signed)
New Msg ° ° ° ° ° ° ° ° °Pt returning call from yesterday. ° ° ° °Please call back. °

## 2014-06-22 NOTE — Telephone Encounter (Signed)
Informed patient of results and verbal understanding expressed.  Patient st she is no longer bothered by palpitations.  Instructed her to call back if they start to bother her.

## 2014-06-27 ENCOUNTER — Other Ambulatory Visit: Payer: Self-pay | Admitting: Cardiovascular Disease

## 2014-06-27 DIAGNOSIS — I350 Nonrheumatic aortic (valve) stenosis: Secondary | ICD-10-CM

## 2014-06-28 ENCOUNTER — Encounter (HOSPITAL_COMMUNITY): Payer: Self-pay | Admitting: Cardiovascular Disease

## 2014-06-28 ENCOUNTER — Ambulatory Visit (HOSPITAL_COMMUNITY)
Admission: RE | Admit: 2014-06-28 | Discharge: 2014-06-28 | Disposition: A | Discharge status: Home / Self Care | Payer: 59 | Source: Ambulatory Visit | Attending: Cardiovascular Disease | Admitting: Cardiovascular Disease

## 2014-06-28 ENCOUNTER — Encounter (HOSPITAL_COMMUNITY)
Admission: RE | Disposition: A | Discharge status: Home / Self Care | Payer: Self-pay | Source: Ambulatory Visit | Attending: Cardiovascular Disease

## 2014-06-28 DIAGNOSIS — I4891 Unspecified atrial fibrillation: Secondary | ICD-10-CM | POA: Diagnosis not present

## 2014-06-28 DIAGNOSIS — I5033 Acute on chronic diastolic (congestive) heart failure: Secondary | ICD-10-CM | POA: Insufficient documentation

## 2014-06-28 DIAGNOSIS — I272 Other secondary pulmonary hypertension: Secondary | ICD-10-CM | POA: Diagnosis not present

## 2014-06-28 DIAGNOSIS — I10 Essential (primary) hypertension: Secondary | ICD-10-CM | POA: Diagnosis not present

## 2014-06-28 DIAGNOSIS — Z7982 Long term (current) use of aspirin: Secondary | ICD-10-CM | POA: Diagnosis not present

## 2014-06-28 DIAGNOSIS — E039 Hypothyroidism, unspecified: Secondary | ICD-10-CM | POA: Insufficient documentation

## 2014-06-28 DIAGNOSIS — Z7901 Long term (current) use of anticoagulants: Secondary | ICD-10-CM | POA: Diagnosis not present

## 2014-06-28 DIAGNOSIS — I350 Nonrheumatic aortic (valve) stenosis: Secondary | ICD-10-CM | POA: Insufficient documentation

## 2014-06-28 DIAGNOSIS — I4892 Unspecified atrial flutter: Secondary | ICD-10-CM | POA: Diagnosis not present

## 2014-06-28 HISTORY — PX: LEFT AND RIGHT HEART CATHETERIZATION WITH CORONARY ANGIOGRAM: SHX5449

## 2014-06-28 LAB — BASIC METABOLIC PANEL
ANION GAP: 10 (ref 5–15)
BUN: 18 mg/dL (ref 6–23)
CO2: 26 mmol/L (ref 19–32)
CREATININE: 1.17 mg/dL — AB (ref 0.50–1.10)
Calcium: 9.3 mg/dL (ref 8.4–10.5)
Chloride: 100 mmol/L (ref 96–112)
GFR, EST AFRICAN AMERICAN: 56 mL/min — AB (ref >90)
GFR, EST NON AFRICAN AMERICAN: 48 mL/min — AB (ref >90)
Glucose, Bld: 149 mg/dL — ABNORMAL HIGH (ref 70–99)
Potassium: 3.7 mmol/L (ref 3.5–5.1)
Sodium: 136 mmol/L (ref 135–145)

## 2014-06-28 LAB — CBC
HCT: 35.5 % — ABNORMAL LOW (ref 36.0–46.0)
Hemoglobin: 11.2 g/dL — ABNORMAL LOW (ref 12.0–15.0)
MCH: 26.4 pg (ref 26.0–34.0)
MCHC: 31.5 g/dL (ref 30.0–36.0)
MCV: 83.5 fL (ref 78.0–100.0)
PLATELETS: 59 10*3/uL — AB (ref 150–400)
RBC: 4.25 MIL/uL (ref 3.87–5.11)
RDW: 17.7 % — ABNORMAL HIGH (ref 11.5–15.5)
WBC: 4.1 10*3/uL (ref 4.0–10.5)

## 2014-06-28 LAB — PROTIME-INR
INR: 1.22 (ref 0.00–1.49)
PROTHROMBIN TIME: 15.6 s — AB (ref 11.6–15.2)

## 2014-06-28 SURGERY — LEFT AND RIGHT HEART CATHETERIZATION WITH CORONARY ANGIOGRAM
Anesthesia: LOCAL

## 2014-06-28 MED ORDER — SODIUM CHLORIDE 0.9 % IV SOLN
INTRAVENOUS | Status: DC
  Administered 2014-06-28: 09:00:00 via INTRAVENOUS

## 2014-06-28 MED ORDER — VERAPAMIL HCL 2.5 MG/ML IV SOLN
INTRAVENOUS | Status: AC
  Filled 2014-06-28: qty 2

## 2014-06-28 MED ORDER — ONDANSETRON HCL 4 MG/2ML IJ SOLN: 4.0000 mg | Freq: Four times a day (QID) | INTRAMUSCULAR | Status: DC | PRN

## 2014-06-28 MED ORDER — SODIUM CHLORIDE 0.9 % IV SOLN: 250.0000 mL | INTRAVENOUS | Status: DC | PRN

## 2014-06-28 MED ORDER — NITROGLYCERIN 1 MG/10 ML FOR IR/CATH LAB
INTRA_ARTERIAL | Status: AC
  Filled 2014-06-28: qty 10

## 2014-06-28 MED ORDER — SODIUM CHLORIDE 0.9 % IJ SOLN: 3.0000 mL | INTRAMUSCULAR | Status: DC | PRN

## 2014-06-28 MED ORDER — HEPARIN (PORCINE) IN NACL 2-0.9 UNIT/ML-% IJ SOLN
INTRAMUSCULAR | Status: AC
  Filled 2014-06-28: qty 1000

## 2014-06-28 MED ORDER — SODIUM CHLORIDE 0.9 % IJ SOLN: 3.0000 mL | Freq: Two times a day (BID) | INTRAMUSCULAR | Status: DC

## 2014-06-28 MED ORDER — SODIUM CHLORIDE 0.9 % IV SOLN: 1.0000 mL/kg/h | INTRAVENOUS | Status: DC

## 2014-06-28 MED ORDER — HEPARIN SODIUM (PORCINE) 1000 UNIT/ML IJ SOLN
INTRAMUSCULAR | Status: AC
  Filled 2014-06-28: qty 1

## 2014-06-28 MED ORDER — FENTANYL CITRATE 0.05 MG/ML IJ SOLN
INTRAMUSCULAR | Status: AC
  Filled 2014-06-28: qty 2

## 2014-06-28 MED ORDER — MIDAZOLAM HCL 2 MG/2ML IJ SOLN
INTRAMUSCULAR | Status: AC
  Filled 2014-06-28: qty 2

## 2014-06-28 MED ORDER — LIDOCAINE HCL (PF) 1 % IJ SOLN
INTRAMUSCULAR | Status: AC
  Filled 2014-06-28: qty 30

## 2014-06-28 MED ORDER — ACETAMINOPHEN 325 MG PO TABS: 650.0000 mg | ORAL_TABLET | ORAL | Status: DC | PRN

## 2014-06-28 NOTE — H&P (Signed)
History of Present Illness: Jamie Howell is a 64 y.o. female who presents for evaluation of severe aortic stenosis.   The patient has underlying rheumatic heart disease and underwent mechanical mitral valve replacement in 1993 with a mechanical bileaflet St Jude prosthesis by Dr Andrey Campanile. She has done very well since her surgery. Approximately 16 years ago she required burr hole surgery for decompression of a subdural hematoma. Otherwise she denies any bleeding problems and has tolerated long-term warfarin well. Until the last few months the patient has not been limited by shortness of breath, chest pain, or lightheadedness.  She describes an episode about 3 weeks ago when she felt heart heart pulsating in her back which is very unusual for her. She was seen by Dr Mayford Knife and an echo and event monitor were done. She is currently wearing the event monitor and the echo demonstrated progression of aortic stenosis, now in the severe range with a mean gradient of 40 mmHg. The palpitations have not recurred as of yet. The patient also complains of shortness of breath with low level exertion over the last few months. Feels like she cannot carry anything or walk even moderate distance without experiencing dyspnea. Denies chest pain or pressure. Occasionally feels 'dizzy-headed' but no syncope or near-syncope. The patient's lasix was recently increased and she has lost 8# by our scales over the past 10 days.  The patient lives with her son in [place]. She works at the [employer] x 15 years. She is a lifelong nonsmoker. She has never been married.   She reports a history of thrombocytopenia and states at the time of her last heart catheterization she required hematology evaluation and platelet transfusion. Splenic sequestration was suspected. Cardiac catheterization in 2011 demonstrated normal coronary arteries.   Past Medical History  Diagnosis Date  . HTN (hypertension)   .  Thyrotoxicosis     without mention of goiter or other cause, without mention of thyrotoxic crisis or storm  . Hypothyroidism   . 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 Atrial fibrillation   . Atrial flutter   . Heart disease   . Hematoma   . Subdural hematoma   . Pulmonary HTN     mild  . OA (ocular albinism)   . Aortic valve stenosis     moderate aortic valve stenosis with moderate aortic insufficiency, stable MVR, trivial PR, mild TR, severe LAE, grade II-III diastolic dysfunction,mild pulmonary HTN , EF 50% by echo 12/2012    Past Surgical History  Procedure Laterality Date  . Total abdominal hysterectomy    . Mitral valve replacement    . Burr hole    . Mechanical aortic valve replacement      Current Outpatient Prescriptions  Medication Sig Dispense Refill  . amLODipine (NORVASC) 2.5 MG tablet Take 1 tablet (2.5 mg total) by mouth daily. 30 tablet 5  . aspirin EC 81 MG tablet Take 1 tablet (81 mg total) by mouth daily.    . Calcium Carb-Cholecalciferol (CALCIUM 1000 + D PO) Take 1,000 mg by mouth daily.    . cyclobenzaprine (FLEXERIL) 10 MG tablet Take 10 mg by mouth 3 (three) times daily as needed for muscle spasms. 1/2 tablet    . furosemide (LASIX) 40 MG tablet Take 1 tablet (40 mg total) by mouth 2 (two) times daily.    Marland Kitchen levothyroxine (SYNTHROID, LEVOTHROID) 150 MCG tablet Take 150 mcg by mouth daily before breakfast.    .  metoprolol succinate (TOPROL-XL) 50 MG 24 hr tablet Take 1 tablet (50 mg total) by mouth daily. Take with or immediately following a meal. 30 tablet 5  . potassium chloride (K-DUR) 10 MEQ tablet Take 10 mEq by mouth daily.    Marland Kitchen. warfarin (COUMADIN) 2.5 MG tablet Take 2.5 mg by mouth daily.     No current facility-administered medications for this visit.    Allergies: Penicillins and Sulfa  antibiotics   Social History: The patient  reports that she has never smoked. She does not have any smokeless tobacco history on file. She reports that she does not drink alcohol or use illicit drugs.   Family History: The patient's family history includes CAD in her father and mother; Cirrhosis in her brother; Heart attack in her sister; Hypertension in her father.    ROS: Please see the history of present illness. Otherwise, review of systems is positive for PND, exertional dyspnea, palpitations, snoring, wheezing, and anxiety. All other systems are reviewed and negative.   PHYSICAL EXAM: VS: BP 128/62 mmHg  Pulse 83  Ht 4\' 11"  (1.499 m)  Wt 189 lb (85.73 kg)  BMI 38.15 kg/m2  SpO2 99% , BMI Body mass index is 38.15 kg/(m^2). GEN: Well nourished, well developed, in no acute distress  HEENT: normal except for poor dentition  Neck: no JVD, carotid bruits, or masses Cardiac: RRR with crisp mechanical S1 and a grade 3/6 harsh late peaking systolic murmur at the RUSB  1+ bilateral pretibial edema Respiratory: clear to auscultation bilaterally, normal work of breathing GI: soft, nontender, nondistended, + BS MS: no deformity or atrophy  Skin: warm and dry, no rash Neuro: Strength and sensation are intact Psych: euthymic mood, full affect  EKG: EKG is not ordered today.  Recent Labs: 05/12/2014: Pro B Natriuretic peptide (BNP) 406.0* 05/20/2014: BUN 24*; Creatinine 1.13; Hemoglobin 10.3*; Platelets 59.0*; Potassium 3.7; Sodium 134*   Lipid Panel   Labs (Brief)    No results found for: CHOL, TRIG, HDL, CHOLHDL, VLDL, LDLCALC, LDLDIRECT     Wt Readings from Last 3 Encounters:  05/18/14 189 lb (85.73 kg)  05/07/14 197 lb (89.359 kg)  12/10/13 191 lb (86.637 kg)     Cardiac Studies Reviewed: 2D Echo: Study Conclusions  - Left ventricle: The cavity size was normal. There was moderate concentric hypertrophy. Systolic function was mildly  reduced. The estimated ejection fraction was in the range of 45% to 50%. Inferior hypokinesis. The study is not technically sufficient to allow evaluation of LV diastolic function. - Aortic valve: Heavily calcified with restricted leaflet motion. Severe aortic stenosis - peak and mean gradients of 60 mmHG and 40 mmHG, respectivley. Based on an LVOT diameter of 1.9 cm, the calculated AVA is 0.6 cm2. There is mild to moderate regurgitation. Regurgitation pressure half-time: 292 ms. - Mitral valve: Mechanical tilting bileaflet valve. Peak and mean gradients of 15 and 5 mmHg. Normal leaflet motion, no evidence for obstruction or paravalvular leak. There was mild regurgitation. - Left atrium: Severely dilated at 65 ml/m2. - Right ventricle: The cavity size was mildly dilated. - Right atrium: Moderately dilated at 24 cm2. - Atrial septum: No defect or patent foramen ovale was identified. - Tricuspid valve: There was moderate regurgitation. - Pulmonary arteries: PA peak pressure: 54 mm Hg (S). - Systemic veins: The IVC is <2.1 cm, but does not collapse >50%, suggesting an elevated RA pressure of 8 mmHg.  Impressions:  - Compared to the prior study in 12/2013, there is now  inferior hypokinesis with EF around 45%, normally functioning mitral valve prosthesis. There is severe aortic stenosis with heavy calcification and AVA around 0.6 cm2 - mean gradient of 40 mmHg. There is moderate AI and moderate TR, RVSP is 54 mmHg, severe LAE and moderate RAE.  ASSESSMENT AND PLAN: 1. Stage D Severe Symptomatic Aortic Stenosis 2. S/P mechanical mitral valve replacement 3. Thrombocytopenia, chronic 4. Acute on chronic diastolic heart failure, secondary to valvular heart disease, NYHA Functional Class III 5. Poor dentition 6. Essential Hypertension  Complex patient with multiple comorbid medical conditions but still very functional. I have personally reviewed her echo  images and agree that she clearly has severe aortic stenosis based on echo criteria.  I have reviewed the natural history of aortic stenosis with the patient.. We have discussed the limitations of medical therapy and the poor prognosis associated with symptomatic aortic stenosis. We have also reviewed potential treatment options, including palliative medical therapy, conventional surgical aortic valve replacement, and transcatheter aortic valve replacement. We discussed treatment options in the context of this patient's specific comorbid medical conditions.   The patient wishes to proceed with evaluation for aortic valve replacement. It's been 5 years since her last cardiac catheterization so this will have to be repeated. I have reviewed the risks, indications, and alternatives to cardiac catheterization. Risks include but are not limited to bleeding, infection, vascular injury, stroke, myocardial infection, arrhythmia, kidney injury, radiation-related injury in the case of prolonged fluoroscopy use, emergency cardiac surgery, and death. The patient understands the risks of serious complication is low (<1%). The patient will requiring Lovenox bridging with a mechanical mitral prosthesis and PAF. In addition, the patient has significant thrombocytopenia and labs have been drawn to evaluate the degree of this. She will require hematology evaluation prior to cardiac catheterization, but I suspect we'll be able to move forward with cardiac cath using radial access to minimize bleeding.   After catheterization, she will require formal cardiac surgical evaluation. She also will have to undergo dental evaluation. She hasn't seen a dentist in many years.   Current medicines are reviewed with the patient today. The patient does not have concerns regarding medicines.  The following changes have been made: no change  Labs/ tests ordered today include:  Orders Placed This Encounter  Procedures  . Basic  Metabolic Panel (BMET)  . CBC w/Diff  . Ambulatory referral to Hematology   Time spent conducting this consultation = 75 minutes, greater than half of that time was spent in direct patient counseling as outlined above.   Signed, Tonny Bollman, MD  05/20/2014 9:06 PM  [facility] [address] [place], [place], [place] [address] Phone: [phone]; Fax: [phone]        ADDENDUM 06/28/2014: Pt seen/examined. She reports no change in sx's since I evaluated her last month. She has undergone hematology evaluation and this has been reviewed. Plan for radial/brachial right/left heart cath today for preoperative evaluation for surgical AVR or TAVR. Will refer to cardiac surgery after the cath procedure.  I have reviewed the risks, indications, and alternatives to cardiac catheterization with the patient. Risks include but are not limited to bleeding, infection, vascular injury, stroke, myocardial infection, arrhythmia, kidney injury, radiation-related injury in the case of prolonged fluoroscopy use, emergency cardiac surgery, and death. The patient understands the risks of serious complication is low (<1%).   Tonny Bollman 06/28/2014 10:53 AM

## 2014-06-28 NOTE — CV Procedure (Signed)
    Cardiac Catheterization Procedure Note  Name: Jamie Howell MRN: [id number] DOB: 06/11/1950  Procedure: Right Heart Cath, catheter placement for selective angiography, Selective Coronary Angiography, aortic root angiography   Indication: Valvular heart disease. This 64 year old lady with history of mitral valve disease and previous mechanical mitral valve replacement now has developed progressive exertional dyspnea and is found to have severe aortic stenosis.   Procedural Details: There was an indwelling IV in a right antecubital vein. Using normal sterile technique, the IV was changed out for a 5 Fr brachial sheath over a 0.018 inch wire. The right wrist was then prepped, draped, and anesthetized with 1% lidocaine. Using the modified Seldinger technique a 5/6 French Slender sheath was placed in the right radial artery. Intra-arterial verapamil was administered through the radial artery sheath. IV heparin was administered after a JR4 catheter was advanced into the central aorta. A Swan-Ganz catheter was used for the right heart catheterization.  it was difficult to advance the catheter. An angled Glidewire was used. A venogram was performed to assist with advancing the catheter to the heart. Standard protocol was followed for recording of right heart pressures and sampling of oxygen saturations. Fick cardiac output was calculated. Standard Judkins catheters were used for selective coronary angiography anaortic root angiography. There were no immediate procedural complications. The patient was transferred to the post catheterization recovery area for further monitoring.  Procedural Findings: Hemodynamics RA 15 RV 57/15 PA 49/22 with a mean of 30 PCWP A wave 22, V wave 23, mean of 20 LV NOT RECORDED  AO 112/57  Oxygen saturations: PA 67 Ao 97  Cardiac Output (Fick) 5.2  Cardiac Index (Fick) 2.9   Coronary angiography: Coronary dominance: right  Left mainstem:Widely patent with no  stenosis.  Left anterior descending (LAthe LAD is widely patent and reaches the LV apex. The diagonal branches are patent with no stenosis.  Left circumflex (LCthe intermediate branch is patent with no stenosis. The AV circumflex is large and supplies a large first obtuse marginal. There is no stenosis throughout.  Right coronary artery (RCA): large, dominant vessel. There is no obstructive disease present. The PDA and PLA branches are patent.   Left ventriculography: Deferred  Aortic Root Angiography: The aortic valve is severely calcified. There is mild aortic insufficiency. The aortic valve mobility is restricted.   The mechanical bileaflet mitral valve appears to have normal leaflet motion. The mitral annulus is severely calcified.   Estimated Blood Loss: minimal  Final Conclusions:   1. Widely patent coronary arteries 2. Status post mechanical mitral valve replacement with normal motion of the bileaflet mechanical prosthesis by fluoroscopy 3. Severely calcified native aortic valve with restricted valve leaflet mobility and known severe aortic stenosis by echo criteria 4. Elevated right and left heart intracardiac filling pressures   Recommendations: cardiac surgical evaluation for consideration of redo sternotomy/conventional aortic valve replacement versus TAVR   Tonny BollmanMichael Lelania Bia MD, [facility] 06/28/2014, 11:54 AM

## 2014-06-28 NOTE — Progress Notes (Signed)
When client arrived from cath lab, cath lab staff holding pressure right brachial site; v-pad placed right brachial and no further bleeding noted; no bleeding or hematoma right radial site or at right brachial site

## 2014-06-28 NOTE — Discharge Instructions (Signed)
Radial Site Care °Refer to this sheet in the next few weeks. These instructions provide you with information on caring for yourself after your procedure. Your caregiver may also give you more specific instructions. Your treatment has been planned according to current medical practices, but problems sometimes occur. Call your caregiver if you have any problems or questions after your procedure. °HOME CARE INSTRUCTIONS °· You may shower the day after the procedure. Remove the bandage (dressing) and gently wash the site with plain soap and water. Gently pat the site dry. °· Do not apply powder or lotion to the site. °· Do not submerge the affected site in water for 3 to 5 days. °· Inspect the site at least twice daily. °· Do not flex or bend the affected arm for 24 hours. °· No lifting over 5 pounds (2.3 kg) for 5 days after your procedure. °· Do not drive home if you are discharged the same day of the procedure. Have someone else drive you. °· You may drive 24 hours after the procedure unless otherwise instructed by your caregiver. °· Do not operate machinery or power tools for 24 hours. °· A responsible adult should be with you for the first 24 hours after you arrive home. °What to expect: °· Any bruising will usually fade within 1 to 2 weeks. °· Blood that collects in the tissue (hematoma) may be painful to the touch. It should usually decrease in size and tenderness within 1 to 2 weeks. °SEEK IMMEDIATE MEDICAL CARE IF: °· You have unusual pain at the radial site. °· You have redness, warmth, swelling, or pain at the radial site. °· You have drainage (other than a small amount of blood on the dressing). °· You have chills. °· You have a fever or persistent symptoms for more than 72 hours. °· You have a fever and your symptoms suddenly get worse. °· Your arm becomes pale, cool, tingly, or numb. °· You have heavy bleeding from the site. Hold pressure on the site. °Document Released: 04/21/2010 Document Revised:  06/11/2011 Document Reviewed: 04/21/2010 °[employer]® Patient Information ©2015 [employer]. This information is not intended to replace advice given to you by your health care provider. Make sure you discuss any questions you have with your health care provider. ° °

## 2014-06-29 LAB — POCT I-STAT 3, ART BLOOD GAS (G3+)
Acid-base deficit: 2 mmol/L (ref 0.0–2.0)
Bicarbonate: 22.8 mEq/L (ref 20.0–24.0)
O2 Saturation: 97 %
PCO2 ART: 36.3 mmHg (ref 35.0–45.0)
PO2 ART: 89 mmHg (ref 80.0–100.0)
TCO2: 24 mmol/L (ref 0–100)
pH, Arterial: 7.407 (ref 7.350–7.450)

## 2014-06-29 LAB — POCT I-STAT 3, VENOUS BLOOD GAS (G3P V)
BICARBONATE: 24.7 meq/L — AB (ref 20.0–24.0)
O2 Saturation: 67 %
PH VEN: 7.382 — AB (ref 7.250–7.300)
TCO2: 26 mmol/L (ref 0–100)
pCO2, Ven: 41.6 mmHg — ABNORMAL LOW (ref 45.0–50.0)
pO2, Ven: 36 mmHg (ref 30.0–45.0)

## 2014-07-06 ENCOUNTER — Telehealth: Payer: Self-pay | Admitting: *Deleted

## 2014-07-07 ENCOUNTER — Encounter: Payer: 59 | Admitting: Thoracic Surgery (Cardiothoracic Vascular Surgery)

## 2014-07-08 ENCOUNTER — Other Ambulatory Visit: Payer: Self-pay | Admitting: *Deleted

## 2014-07-08 ENCOUNTER — Encounter: Payer: Self-pay | Admitting: Thoracic Surgery (Cardiothoracic Vascular Surgery)

## 2014-07-08 ENCOUNTER — Institutional Professional Consult (permissible substitution) (INDEPENDENT_AMBULATORY_CARE_PROVIDER_SITE_OTHER): Payer: 59 | Admitting: Thoracic Surgery (Cardiothoracic Vascular Surgery)

## 2014-07-08 VITALS — BP 124/68 | HR 79 | Resp 20 | Ht 59.0 in | Wt 184.0 lb

## 2014-07-08 DIAGNOSIS — Z954 Presence of other heart-valve replacement: Secondary | ICD-10-CM

## 2014-07-08 DIAGNOSIS — I5032 Chronic diastolic (congestive) heart failure: Secondary | ICD-10-CM

## 2014-07-08 DIAGNOSIS — I350 Nonrheumatic aortic (valve) stenosis: Secondary | ICD-10-CM | POA: Diagnosis not present

## 2014-07-08 DIAGNOSIS — E669 Obesity, unspecified: Secondary | ICD-10-CM | POA: Diagnosis not present

## 2014-07-08 HISTORY — DX: Obesity, unspecified: E66.9

## 2014-07-08 NOTE — Patient Instructions (Signed)
[facility]  Schedule CT angiograms

## 2014-07-08 NOTE — Progress Notes (Signed)
[address]       [place] [id number]             [phone]     CARDIOTHORACIC SURGERY CONSULTATION REPORT  Referring Provider is Tonny Bollmanooper, Michael, MD PCP is Londell MohPHARR,[name], MD  Chief Complaint  Patient presents with  . Aortic Stenosis    Surgical eval for possible TAVR v/s AVR, Cardiac Cath 06/28/14    HPI:  Patient is 64 year old obese white female from [place] with known history of long-standing rheumatic heart disease who has been referred to discuss treatment options for management of severe symptomatic aortic stenosis.  The patient states that she was hospitalized for more than 2 weeks when she was 64 years old with a prolonged febrile illness. She was not diagnosed with rheumatic fever at the time, but she later was found to have a heart murmur and told that she had likely experienced rheumatic fever in the past. She developed symptoms of congestive heart failure and atrial fibrillation and eventually underwent mitral valve replacement using a St. Jude bileaflet mechanical prosthesis in 1993 for severe mitral stenosis.  Her surgical procedure was uncomplicated and she has done well from a cardiac standpoint until recently. Approximately 16 years ago she developed an intracranial hemorrhage related to anticoagulation with warfarin for which she required burr hole craniotomy for decompression.  For the last several years she has been followed by Dr. Mayford Knifeurner. Echocardiograms have demonstrated the presence of stable left ventricular systolic function with normal functioning mechanical mitral valve prosthesis, but the patient has developed progressive aortic stenosis. Over the last several months the patient has developed progressive exertional shortness of breath and increased episodes of intermittent tachypalpitations.  Follow-up echocardiogram performed 05/12/2014 revealed the presence of severe aortic stenosis with peak velocity across the aortic valve  measured 3.9 m/s corresponding to a mean transvalvular gradient estimated 40 mmHg. There was mild to moderate left ventricular systolic dysfunction with ejection fraction estimated 45-50%.The patient was referred to Dr. Excell Seltzerooper for consultation and underwent left and right heart catheterization on 06/28/2014 which revealed normal coronary arteries with no significant coronary artery disease. There appeared to be normal function of the bileaflet mechanical valve in the mitral position. There was moderate pulmonary hypertension. The patient additionally recently wore a 30 day cardiac event monitor that demonstrated sinus rhythm with PVCs and atrial tachycardia. There was no documented atrial fibrillation.  The patient has been referred for surgical consultation.  The patient is single and lives locally in [place] with one of her adult children. 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  She lives a sedentary lifestyle. She describes progressive symptoms of exertional shortness of breath which have developed over the last 3-6 months. She now gets short of breath with relatively low level exertion.  She denies any history of resting shortness of breath, PND, orthopnea, or lower extremity edema. She has occasional tachycardia palpitations with dizziness but no syncope. She has not experienced exertional chest pain or chest tightness, although she occasionally gets very transient episodes of atypical chest pain in the left chest.  Past Medical History  Diagnosis Date  . HTN (hypertension)   . Thyrotoxicosis      without mention of goiter or other cause, without mention of thyrotoxic crisis or storm  . Hypothyroidism   . 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 Atrial fibrillation   . Atrial  flutter   . Heart disease   . Hematoma   . Subdural hematoma   . Pulmonary HTN     mild  . OA (ocular albinism)   . Aortic valve stenosis     moderate  aortic valve stenosis with moderate aortic insufficiency, stable MVR, trivial PR, mild TR, severe LAE, grade II-III diastolic dysfunction,mild pulmonary HTN , EF 50% by echo 12/2012  . 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 Chronic recurrent paroxysmal atrial fibrillation   . Obesity (BMI 30-39.9) 07/08/2014    Past Surgical History  Procedure Laterality Date  . Total abdominal hysterectomy    . Mitral valve replacement  04/08/1991    31mm St Jude mechanical valve - Dr Andrey Campanile  . Left and right heart catheterization with coronary angiogram N/A 06/28/2014    Procedure: LEFT AND RIGHT HEART CATHETERIZATION WITH CORONARY ANGIOGRAM;  Surgeon: Tonny Bollman, MD;  Location: [facility] CATH LAB;  Service: Cardiovascular;  Laterality: N/A;  . Subdural hematoma evacuation via craniotomy  2000    Dr Dutch Quint    Family History  Problem Relation Age of Onset  . CAD Father   . Hypertension Father   . CAD Mother   . Cirrhosis Brother   . Heart attack Sister     History   Social History  . Marital Status: Single    Spouse Name: N/A  . Number of Children: N/A  . Years of Education: N/A   Occupational History  . Not on file.   Social History Main Topics  . Smoking status: Never Smoker   . Smokeless tobacco: Not on file  . Alcohol Use: No  . Drug Use: No  . Sexual Activity: Not on file   Other Topics Concern  . Not on file   Social History Narrative    Current Outpatient Prescriptions  Medication Sig Dispense Refill  . amLODipine (NORVASC) 2.5 MG tablet Take 1 tablet (2.5 mg total) by mouth daily. 30 tablet 5  . aspirin EC 81 MG tablet Take 1 tablet (81 mg total) by mouth daily. (Patient taking differently: Take 81 mg by mouth daily with lunch. )    . Calcium Carb-Cholecalciferol (CALCIUM 1000 + D PO) Take 1,000 mg by mouth daily with lunch.     . cyclobenzaprine (FLEXERIL) 10 MG tablet Take  5 mg by mouth at bedtime.     . furosemide (LASIX) 40 MG tablet Take 1 tablet (40 mg total) by mouth 2 (two) times daily.    Marland Kitchen levothyroxine (SYNTHROID, LEVOTHROID) 150 MCG tablet Take 150 mcg by mouth daily before breakfast.    . metoprolol succinate (TOPROL-XL) 50 MG 24 hr tablet Take 1 tablet (50 mg total) by mouth daily. Take with or immediately following a meal. (Patient taking differently: Take 50 mg by mouth at bedtime. Take with or immediately following a meal.) 30 tablet 5  . potassium chloride (K-DUR) 10 MEQ tablet Take 10 mEq by mouth daily with lunch.     . warfarin (COUMADIN) 2.5 MG tablet Take 2.5-5 mg by mouth daily. Take 2 tablets (5 mg) on Wednesday and Friday, take 1 tablet (2.5 mg) on Sunday, Monday, Tuesday, Thursday, Saturday    . enoxaparin (LOVENOX) 40 MG/0.4ML injection Inject 40 mg into the skin See admin instructions. Inject 40 mg on Friday 06/25/14, Saturday 06/26/14 and Sunday 06/27/14 and as directed after procedure     No  current facility-administered medications for this visit.    Allergies  Allergen Reactions  . Latex Rash    Reaction to heart monitor pads and bandaids (cloth tape ok)  . Penicillins Rash  . Sulfa Antibiotics Rash      Review of Systems:   General:  normal appetite, decreased energy, no weight gain, no weight loss, no fever  Cardiac:  no chest pain with exertion, no chest pain at rest, + SOB with exertion, no resting SOB, no PND, no orthopnea, + palpitations, + arrhythmia, + atrial fibrillation, no LE edema, + dizzy spells, no syncope  Respiratory:  + shortness of breath, no home oxygen, no productive cough, no dry cough, no bronchitis, no wheezing, no hemoptysis, no asthma, no pain with inspiration or cough, no sleep apnea, no CPAP at night  GI:   no difficulty swallowing, no reflux, no frequent heartburn, no hiatal hernia, no abdominal pain, no constipation, no diarrhea, no hematochezia, no hematemesis, no melena  GU:   no dysuria,  no  frequency, no urinary tract infection, no hematuria, no kidney stones, no kidney disease  Vascular:  no pain suggestive of claudication, no pain in feet, + leg cramps, + severe varicose veins, no DVT, no non-healing foot ulcer  Neuro:   no stroke, no TIA's, no seizures, no headaches, no temporary blindness one eye,  no slurred speech, no peripheral neuropathy, no chronic pain, no instability of gait, no memory/cognitive dysfunction  Musculoskeletal: no arthritis, no joint swelling, no myalgias, no difficulty walking, normal mobility   Skin:   no rash, no itching, no skin infections, no pressure sores or ulcerations  Psych:   no anxiety, no depression, no nervousness, no unusual recent stress  Eyes:   no blurry vision, no floaters, no recent vision changes, + wears glasses or contacts  ENT:   no hearing loss, no loose or painful teeth but poor dentition, no dentures, last saw dentist many years ago  Hematologic:  + easy bruising, no abnormal bleeding, no clotting disorder, no frequent epistaxis  Endocrine:  no diabetes, does not check CBG's at home     Physical Exam:   BP 124/68 mmHg  Pulse 79  Resp 20  Ht  (1.499 m)  Wt 184 lb (83.462 kg)  BMI 37.14 kg/m2  SpO2 93%  General:  Obese but o/w  well-appearing  HEENT:  Unremarkable   Neck:   no JVD, no bruits, no adenopathy   Chest:   clear to auscultation, symmetrical breath sounds, no wheezes, no rhonchi   CV:   RRR, grade III/VI systolic murmur   Abdomen:  soft, non-tender, no masses   Extremities:  warm, well-perfused, pulses diminished, no LE edema, severe varicose veins with moderate-severe chronic venous insufficiency  Rectal/GU  Deferred  Neuro:   Grossly non-focal and symmetrical throughout  Skin:   Clean and dry, no rashes, no breakdown, + chronic skin changes both lower legs related to venous insufficiency   Diagnostic Tests:  Transthoracic Echocardiography  Patient:  Jamie Howell, Jamie Howell MR #:    [id number] Study  Date: 05/12/2014 Gender:   F Age:    14 Height:   149.9 cm Weight:   84.8 kg BSA:    1.93 m^2 Pt. Status: Room:  ORDERING   Armanda Magic, MD REFERRING  Armanda Magic, MD ATTENDING  Default, Provider [phone] SONOGRAPHER Aida Raider, RDCS PERFORMING  [facility], Outpatient  cc:  ------------------------------------------------------------------- LV EF: 45% -  50%  ------------------------------------------------------------------- Indications:   I05.9 Mitral Valve  Disorder. I35.0 Aortic Valve Disorder.  ------------------------------------------------------------------- History:  PMH: Acquired from the patient and from the patient&'s chart. PMH: Aortic Insufficiency. CHF. Pulmonary Hypertension. History of Rheumatic Mitral Valve. Atrial Fibrillation. Shortness of Breath. Hypothyroidism. Risk factors: Hypertension.  ------------------------------------------------------------------- Study Conclusions  - Left ventricle: The cavity size was normal. There was moderate concentric hypertrophy. Systolic function was mildly reduced. The estimated ejection fraction was in the range of 45% to 50%. Inferior hypokinesis. The study is not technically sufficient to allow evaluation of LV diastolic function. - Aortic valve: Heavily calcified with restricted leaflet motion. Severe aortic stenosis - peak and mean gradients of 60 mmHG and 40 mmHG, respectivley. Based on an LVOT diameter of 1.9 cm, the calculated AVA is 0.6 cm2. There is mild to moderate regurgitation. Regurgitation pressure half-time: 292 ms. - Mitral valve: Mechanical tilting bileaflet valve. Peak and mean gradients of 15 and 5 mmHg. Normal leaflet motion, no evidence for obstruction or paravalvular leak. There was mild regurgitation. - Left atrium: Severely dilated at 65 ml/m2. - Right ventricle: The cavity size was mildly dilated. - Right atrium: Moderately  dilated at 24 cm2. - Atrial septum: No defect or patent foramen ovale was identified. - Tricuspid valve: There was moderate regurgitation. - Pulmonary arteries: PA peak pressure: 54 mm Hg (S). - Systemic veins: The IVC is <2.1 cm, but does not collapse >50%, suggesting an elevated RA pressure of 8 mmHg.  Impressions:  - Compared to the prior study in 12/2013, there is now inferior hypokinesis with EF around 45%, normally functioning mitral valve prosthesis. There is severe aortic stenosis with heavy calcification and AVA around 0.6 cm2 - mean gradient of 40 mmHg. There is moderate AI and moderate TR, RVSP is 54 mmHg, severe LAE and moderate RAE.  ------------------------------------------------------------------- Labs, prior tests, procedures, and surgery: Status post Mitral Valve Replacment-St Jude Mechanical. Transthoracic echocardiography. M-mode, complete 2D, spectral Doppler, and color Doppler. Birthdate: Patient birthdate: [birth year]. Age: Patient is 64 yr old. Sex: Gender: female. BMI: 37.8 kg/m^2. Blood pressure:   150/82 Patient status: Outpatient. Study date: Study date: 05/12/2014. Study time: 08:54 AM. Location: [facility]  -------------------------------------------------------------------  ------------------------------------------------------------------- Left ventricle: The cavity size was normal. There was moderate concentric hypertrophy. Systolic function was mildly reduced. The estimated ejection fraction was in the range of 45% to 50%. Inferior hypokinesis. The study is not technically sufficient to allow evaluation of LV diastolic function.  ------------------------------------------------------------------- Aortic valve: Heavily calcified with restricted leaflet motion. Severe aortic stenosis - peak and mean gradients of 60 mmHG and 40 mmHG, respectivley. Based on an LVOT diameter of 1.9 cm, the calculated AVA is 0.6  cm2. There is mild to moderate regurgitation. Trileaflet. Doppler:   VTI ratio of LVOT to aortic valve: 0.21. Valve area (VTI): 0.59 cm^2. Indexed valve area (VTI): 0.31 cm^2/m^2. Mean velocity ratio of LVOT to aortic valve: 0.21. Valve area (Vmean): 0.61 cm^2. Indexed valve area (Vmean): 0.32 cm^2/m^2.  Mean gradient (S): 35 mm Hg.  ------------------------------------------------------------------- Aorta: Aortic root: The aortic root was normal in size. Ascending aorta: The ascending aorta was normal in size.  ------------------------------------------------------------------- Mitral valve: Mechanical tilting bileaflet valve. Peak and mean gradients of 15 and 5 mmHg. Normal leaflet motion, no evidence for obstruction or paravalvular leak. Doppler: There was mild regurgitation.  Valve area by pressure half-time: 2.97 cm^2. Indexed valve area by pressure half-time: 1.54 cm^2/m^2. Valve area by continuity equation (using LVOT flow): 1.74 cm^2. Indexed valve area by continuity equation (using LVOT flow): 0.9 cm^2/m^2.  Mean gradient (D): 5 mm Hg. Peak gradient (D): 14 mm Hg.  ------------------------------------------------------------------- Left atrium: Severely dilated at 65 ml/m2.  ------------------------------------------------------------------- Atrial septum: No defect or patent foramen ovale was identified.  ------------------------------------------------------------------- Right ventricle: The cavity size was mildly dilated. Systolic function was normal.  ------------------------------------------------------------------- Pulmonic valve:  The valve appears to be grossly normal. Doppler: There was trivial regurgitation.  ------------------------------------------------------------------- Tricuspid valve:  Doppler: There was moderate regurgitation.  ------------------------------------------------------------------- Pulmonary artery:  The main  pulmonary artery was normal-sized.  ------------------------------------------------------------------- Right atrium: Moderately dilated at 24 cm2.  ------------------------------------------------------------------- Pericardium: There was no pericardial effusion.  ------------------------------------------------------------------- Systemic veins: The IVC is <2.1 cm, but does not collapse >50%, suggesting an elevated RA pressure of 8 mmHg.  ------------------------------------------------------------------- Measurements  Left ventricle              Value     Reference LV ID, ED, PLAX chordal          49  mm    43 - 52 LV ID, ES, PLAX chordal          36  mm    23 - 38 LV fx shortening, PLAX chordal  (L)   27  %    >=29 LV PW thickness, ED            12  mm    --------- IVS/LV PW ratio, ED            1       <=1.3 Stroke volume, 2D             52  ml    --------- Stroke volume/bsa, 2D           27  ml/m^2  --------- LV e&', lateral              9.08 cm/s   --------- LV E/e&', lateral             20.37     --------- LV e&', medial               6.49 cm/s   --------- LV E/e&', medial              28.51     --------- LV e&', average              7.79 cm/s   --------- LV E/e&', average             23.76     ---------  Ventricular septum            Value     Reference IVS thickness, ED             12  mm    ---------  LVOT                   Value     Reference LVOT ID, S                19  mm    --------- LVOT area                 2.84 cm^2   --------- LVOT mean velocity, S           58.9 cm/s   --------- LVOT VTI, S                 18.3 cm    ---------  Aortic valve  Value     Reference Aortic valve mean velocity, S       275  cm/s   --------- Aortic valve VTI, S            87.5 cm    --------- Aortic mean gradient, S          35  mm Hg  --------- VTI ratio, LVOT/AV            0.21      --------- Aortic valve area, VTI          0.59 cm^2   --------- Aortic valve area/bsa, VTI        0.31 cm^2/m^2 --------- Velocity ratio, mean, LVOT/AV       0.21      --------- Aortic valve area, mean velocity     0.61 cm^2   --------- Aortic valve area/bsa, mean        0.32 cm^2/m^2 --------- velocity Aortic regurg pressure half-time     292  ms    ---------  Aorta                   Value     Reference Aortic root ID, ED            33  mm    ---------  Left atrium                Value     Reference LA ID, A-P, ES              59  mm    --------- LA ID/bsa, A-P          (H)   3.06 cm/m^2  <=2.2 LA volume, S               120  ml    --------- LA volume/bsa, S             62.3 ml/m^2  --------- LA volume, ES, 1-p A4C          116  ml    --------- LA volume/bsa, ES, 1-p A4C        60.2 ml/m^2  --------- LA volume, ES, 1-p A2C          123  ml    --------- LA volume/bsa, ES, 1-p A2C        63.9 ml/m^2  ---------  Mitral valve               Value     Reference Mitral E-wave peak velocity        185  cm/s   --------- Mitral mean velocity, D          104  cm/s   --------- Mitral deceleration time         187  ms    150 - 230 Mitral pressure half-time         83  ms    --------- Mitral mean gradient,  D          5   mm Hg  --------- Mitral peak gradient, D          14  mm Hg  --------- Mitral E/A ratio, peak          2.8      --------- Mitral valve area, PHT, DP        2.97 cm^2   --------- Mitral valve area/bsa, PHT, DP      1.54 cm^2/m^2 --------- Mitral valve area, LVOT  1.74 cm^2   --------- continuity Mitral valve area/bsa, LVOT        0.9  cm^2/m^2 --------- continuity Mitral annulus VTI, D           29.9 cm    ---------  Pulmonary arteries            Value     Reference PA pressure, S, DP        (H)   54  mm Hg  <=30  Tricuspid valve              Value     Reference Tricuspid regurg peak velocity      351  cm/s   --------- Tricuspid peak RV-RA gradient       49  mm Hg  ---------  Right ventricle              Value     Reference RV s&', lateral, S             16.6 cm/s   ---------  Legend: (L) and (H) mark values outside specified reference range.  ------------------------------------------------------------------- Prepared and Electronically Authenticated by  Zoila Shutter MD 2016-02-10T12:41:45    Cardiac Catheterization Procedure Note  Name: Jamie Howell MRN: [id number] DOB: 1950/12/23  Procedure: Right Heart Cath, catheter placement for selective angiography, Selective Coronary Angiography, aortic root angiography   Indication: Valvular heart disease. This 64 year old lady with history of mitral valve disease and previous mechanical mitral valve replacement now has developed progressive exertional dyspnea and is found to have severe aortic stenosis.   Procedural Details: There was an indwelling IV in a right antecubital vein. Using normal sterile technique, the IV was changed out for a 5 Fr brachial sheath over a 0.018 inch wire. The right  wrist was then prepped, draped, and anesthetized with 1% lidocaine. Using the modified Seldinger technique a 5/6 French Slender sheath was placed in the right radial artery. Intra-arterial verapamil was administered through the radial artery sheath. IV heparin was administered after a JR4 catheter was advanced into the central aorta. A Swan-Ganz catheter was used for the right heart catheterization. it was difficult to advance the catheter. An angled Glidewire was used. A venogram was performed to assist with advancing the catheter to the heart. Standard protocol was followed for recording of right heart pressures and sampling of oxygen saturations. Fick cardiac output was calculated. Standard Judkins catheters were used for selective coronary angiography anaortic root angiography. There were no immediate procedural complications. The patient was transferred to the post catheterization recovery area for further monitoring.  Procedural Findings: Hemodynamics RA 15 RV 57/15 PA 49/22 with a mean of 30 PCWP A wave 22, V wave 23, mean of 20 LV NOT RECORDED  AO 112/57  Oxygen saturations: PA 67 Ao 97  Cardiac Output (Fick) 5.2  Cardiac Index (Fick) 2.9  Coronary angiography: Coronary dominance: right  Left mainstem:Widely patent with no stenosis.  Left anterior descending (LAthe LAD is widely patent and reaches the LV apex. The diagonal branches are patent with no stenosis.  Left circumflex (LCthe intermediate branch is patent with no stenosis. The AV circumflex is large and supplies a large first obtuse marginal. There is no stenosis throughout.  Right coronary artery (RCA): large, dominant vessel. There is no obstructive disease present. The PDA and PLA branches are patent.   Left ventriculography: Deferred  Aortic Root Angiography: The aortic valve is severely calcified. There is mild aortic insufficiency. The aortic valve mobility is restricted.  The mechanical bileaflet  mitral valve appears to have normal leaflet motion. The mitral annulus is severely calcified.   Estimated Blood Loss: minimal  Final Conclusions:  1. Widely patent coronary arteries 2. Status post mechanical mitral valve replacement with normal motion of the bileaflet mechanical prosthesis by fluoroscopy 3. Severely calcified native aortic valve with restricted valve leaflet mobility and known severe aortic stenosis by echo criteria 4. Elevated right and left heart intracardiac filling pressures   Recommendations: cardiac surgical evaluation for consideration of redo sternotomy/conventional aortic valve replacement versus TAVR   Tonny Bollman MD, [facility] 06/28/2014, 11:54 AM     Impression:  Patient has stage D severe symptomatic aortic stenosis. She presents with a several month history of worsening symptoms of exertional shortness of breath and fatigue consistent with chronic diastolic congestive heart failure, [place] Heart Association functional class 2-3. 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 I have personally reviewed the patient's recent transthoracic echocardiogram and diagnostic cardiac catheterization. The aortic valve appears tricuspid with severe thickening and restricted leaflet mobility involving all 3 leaflets. There is mild aortic insufficiency. Peak velocity across the aortic valve measured 3.9 m/s corresponding to estimated mean transvalvular gradient of approximately 40 mmHg. The diameter of the left ventricular outflow track was measured 1.9 cm.  The mitral valve prosthesis appears to be functioning normally. The patient additionally has moderate pulmonary hypertension and moderate tricuspid regurgitation.  Risks associated with conventional surgical aortic valve replacement through a redo median sternotomy would be at least moderately elevated. Given the  relatively small size of the patient's aortic valve and aortic root surgical risks might be somewhat higher as there may be a possibility that aortic root enlargement or replacement would be necessary to avoid the possibility of patient prosthesis mismatch. Transcatheter aortic valve replacement might be a reasonable treatment alternative, although the patient's relatively young age raises questions about the long-term durability of currently available transcatheter heart valves.  Because the patient has our been committed to long-term anticoagulation using warfarin with the presence of mechanical prosthesis in the mitral valve position, aortic valve replacement using a low profile mechanical prosthesis might offer the advantage of better long-term durability.  Finally, the patient has poor dentition and will need dental consultation and possible extraction before any procedure should be performed.   Plan:  The patient was counseled at length regarding surgical alternatives with respect to aortic valve replacement including transcatheter aortic valve replacement versus proceeding with conventional surgical aortic valve replacement using a mechanical prosthesis.  The potential risks of each approach were discussed, as were expectations for the patient's postoperative convalescence.  The advantages associated with transcatheter aortic valve replacement were discussed and contrasted with concerns regarding long-term valve durability.  Other alternatives including continued medical therapy was discussed.  The natural history of severe symptomatic aortic stenosis was discussed at length.  The patient is interested in proceeding with further diagnostic workup to consider surgery in the reasonably near future. We plan to obtain cardiac gated CT angiogram of the heart to further characterize the size of the patient's aortic valve and aortic root as well as other characteristics which might affect the relative risks and  feasibility of either conventional surgical aortic valve replacement or transcatheter aortic valve replacement. With this information we hope to make a decision which approach seems optimal. In the meanwhile, the patient will be referred for dental consultation. The patient will return In 3  weeks to review the results of her CT angiograms.   I spent in excess of 90 minutes during the conduct of this office consultation and >50% of this time involved direct face-to-face encounter with the patient for counseling and/or coordination of their care.   Salvatore Decent. Cornelius Moras, MD 07/08/2014 12:45 PM

## 2014-07-14 ENCOUNTER — Ambulatory Visit (HOSPITAL_COMMUNITY)
Admission: RE | Admit: 2014-07-14 | Discharge: 2014-07-14 | Disposition: A | Discharge status: Home / Self Care | Payer: 59 | Source: Ambulatory Visit | Attending: Thoracic Surgery (Cardiothoracic Vascular Surgery) | Admitting: Thoracic Surgery (Cardiothoracic Vascular Surgery)

## 2014-07-14 ENCOUNTER — Ambulatory Visit (HOSPITAL_COMMUNITY): Payer: 59

## 2014-07-14 DIAGNOSIS — R161 Splenomegaly, not elsewhere classified: Secondary | ICD-10-CM

## 2014-07-14 DIAGNOSIS — I350 Nonrheumatic aortic (valve) stenosis: Secondary | ICD-10-CM

## 2014-07-14 DIAGNOSIS — R918 Other nonspecific abnormal finding of lung field: Secondary | ICD-10-CM | POA: Diagnosis not present

## 2014-07-14 DIAGNOSIS — I8500 Esophageal varices without bleeding: Secondary | ICD-10-CM

## 2014-07-14 DIAGNOSIS — K449 Diaphragmatic hernia without obstruction or gangrene: Secondary | ICD-10-CM | POA: Insufficient documentation

## 2014-07-14 DIAGNOSIS — K766 Portal hypertension: Secondary | ICD-10-CM

## 2014-07-14 DIAGNOSIS — Z48812 Encounter for surgical aftercare following surgery on the circulatory system: Secondary | ICD-10-CM | POA: Diagnosis not present

## 2014-07-14 HISTORY — DX: Esophageal varices without bleeding: I85.00

## 2014-07-14 HISTORY — DX: Splenomegaly, not elsewhere classified: R16.1

## 2014-07-14 IMAGING — CT CT HEART MORP W/ CTA COR W/ SCORE W/ CA W/CM &/OR W/O CM
1 of 14 series · 1 of 19 positions shown, 2 images · IV contrast (Iodine)
Comparison: Portable chest 07/01/2003.

CLINICAL DATA: Aortic Stenosis Pre TAVR

EXAM:
Cardiac TAVR CT
TECHNIQUE: The patient was scanned on a Philips 256 scanner. A 120 kV
retrospective scan was triggered in the descending thoracic aorta at
111 HU's. Gantry rotation speed was 270 msecs and collimation was .9
mm. No beta blockade or nitro were given. The 3D data set was
reconstructed in 5% intervals of the R-R cycle. Systolic and
diastolic phases were analyzed on a dedicated work station using
MPR, MIP and VRT modes. The patient received 80 cc of contrast.

[Series 200: locator · axial · 0.35mm/px · z∈[-199,-199]mm · 1 of 1 slices shown, 2 images]
[im 1/1  vessel]
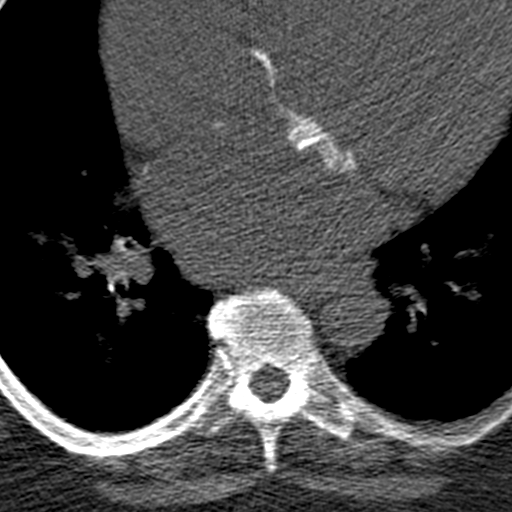
[im 1/1  lung]
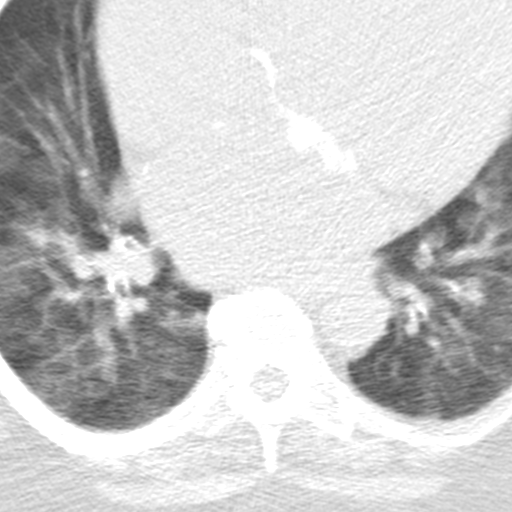

[1 of 19 positions shown; findings below may reference images not displayed]

FINDINGS: Aortic Valve: Trileaflet non coronary cusp less calcified. Annulus
is more spherical than usual with no calcium.

There is a normal functioning bileaflet mechanical Mitral valve

There is severe biatrial enlargement

Sinotubular Junction:  2.9 cm

Ascending Thoracic Aorta:  3.4 cm

Descending Thoracic Aorta:  1.9 cm

Sinus of Valsalva Measurements:

Non-coronary:  34 mm

Right -coronary:  32 mm

Left -coronary:  30 mm

Coronary Artery Height above Annulus:

Left Main:  15.8 mm

Right Coronary:  14.8 mm

Virtual Basal Annulus Measurements:

Maximum/Minimum Diameter:  26.7 mm x 23 mm more spherical than usual

Area:  534 mm2

Optimum Fluoroscopic Angle for Delivery: RAO 6 degrees Cranial 0
degrees
IMPRESSION: 1) Calcified trileaflet aortic valve. Spherical non calcified
annulus measures 534 mm2 suitable for underinflated 29 mm Sapien 3

2) LVOT not narrow measures 2.5 cm with no anterior mitral leaflet
due to MVR

3) Normal appearing bileaflet mechanical MVR

4) Severe biatrial enlargement

5) Suitable coronary height for delivery of valve

6) Optimum Angiographic angle for delivery RAO 6 degrees Cranial 0
degrees

Nkiawete Hamuth

EXAM:
OVER-READ INTERPRETATION  CT CHEST

The following report is an over-read performed by radiologist Dr.
over-read does not include interpretation of cardiac or coronary
anatomy or pathology. The coronary CTA interpretation by the
cardiologist is attached.
This study was performed in
conjunction with a routine CTA of the chest, abdomen and pelvis.
FINDINGS: Small mediastinal and hilar lymph nodes are better seen on
accompanying chest CTA. There is a small hiatal hernia.

Cardiovascular findings are reported separately.

The lungs demonstrate patchy ground-glass opacities with mosaic
attenuation. No endobronchial lesion or confluent airspace opacity
identified.

The visualized upper abdomen is notable for contour irregularity of
the liver suspicious for cirrhosis. The spleen appears enlarged.
IMPRESSION: 1. Mosaic attenuation in the lungs, likely related to small airways
disease.
2. Nonspecific small mediastinal lymph nodes, not pathologically
enlarged.
3. Probable hepatic cirrhosis and portal hypertension manifesting as
splenomegaly.
4. Cardiovascular findings are dictated separately. In addition,
chest CTA findings dictated separately.

## 2014-07-14 MED ORDER — IOHEXOL 350 MG/ML SOLN
70.0000 mL | Freq: Once | INTRAVENOUS | Status: AC | PRN
  Administered 2014-07-14: 70 mL via INTRAVENOUS

## 2014-07-14 MED ORDER — METOPROLOL TARTRATE 1 MG/ML IV SOLN
INTRAVENOUS | Status: AC
  Administered 2014-07-14: 5 mg via INTRAVENOUS
  Filled 2014-07-14: qty 5

## 2014-07-14 MED ORDER — METOPROLOL TARTRATE 1 MG/ML IV SOLN
5.0000 mg | Freq: Once | INTRAVENOUS | Status: AC
  Administered 2014-07-14: 5 mg via INTRAVENOUS

## 2014-07-14 MED ORDER — IOHEXOL 350 MG/ML SOLN
80.0000 mL | Freq: Once | INTRAVENOUS | Status: AC | PRN
  Administered 2014-07-14: 80 mL via INTRAVENOUS

## 2014-07-16 ENCOUNTER — Telehealth (HOSPITAL_COMMUNITY): Payer: Self-pay

## 2014-07-16 NOTE — Telephone Encounter (Signed)
07/16/14            Called and left mgs. on patient's home and mobile numbers to call Dental Medicine to schedule a Dental Consult w/Dr. Kristin BruinsKulinski.  LRI

## 2014-07-19 ENCOUNTER — Encounter (HOSPITAL_COMMUNITY): Payer: Self-pay | Admitting: Dentistry

## 2014-07-19 ENCOUNTER — Ambulatory Visit (HOSPITAL_COMMUNITY): Payer: Self-pay | Admitting: Dentistry

## 2014-07-19 ENCOUNTER — Encounter (INDEPENDENT_AMBULATORY_CARE_PROVIDER_SITE_OTHER): Payer: Self-pay

## 2014-07-19 VITALS — BP 143/90 | HR 78 | Temp 97.8°F

## 2014-07-19 DIAGNOSIS — K029 Dental caries, unspecified: Secondary | ICD-10-CM

## 2014-07-19 DIAGNOSIS — I350 Nonrheumatic aortic (valve) stenosis: Secondary | ICD-10-CM

## 2014-07-19 DIAGNOSIS — Z9189 Other specified personal risk factors, not elsewhere classified: Secondary | ICD-10-CM

## 2014-07-19 DIAGNOSIS — Z01810 Encounter for preprocedural cardiovascular examination: Secondary | ICD-10-CM

## 2014-07-19 DIAGNOSIS — K036 Deposits [accretions] on teeth: Secondary | ICD-10-CM

## 2014-07-19 DIAGNOSIS — Z01818 Encounter for other preprocedural examination: Secondary | ICD-10-CM

## 2014-07-19 DIAGNOSIS — K117 Disturbances of salivary secretion: Secondary | ICD-10-CM

## 2014-07-19 DIAGNOSIS — R682 Dry mouth, unspecified: Secondary | ICD-10-CM

## 2014-07-19 DIAGNOSIS — Z7901 Long term (current) use of anticoagulants: Secondary | ICD-10-CM

## 2014-07-19 DIAGNOSIS — K083 Retained dental root: Secondary | ICD-10-CM

## 2014-07-19 DIAGNOSIS — M264 Malocclusion, unspecified: Secondary | ICD-10-CM

## 2014-07-19 DIAGNOSIS — K0530 Chronic periodontitis, unspecified: Secondary | ICD-10-CM

## 2014-07-19 DIAGNOSIS — D696 Thrombocytopenia, unspecified: Secondary | ICD-10-CM

## 2014-07-19 DIAGNOSIS — K0889 Other specified disorders of teeth and supporting structures: Secondary | ICD-10-CM

## 2014-07-19 DIAGNOSIS — K045 Chronic apical periodontitis: Secondary | ICD-10-CM

## 2014-07-19 DIAGNOSIS — Z954 Presence of other heart-valve replacement: Secondary | ICD-10-CM

## 2014-07-19 DIAGNOSIS — IMO0002 Reserved for concepts with insufficient information to code with codable children: Secondary | ICD-10-CM

## 2014-07-19 DIAGNOSIS — F40298 Other specified phobia: Secondary | ICD-10-CM

## 2014-07-19 NOTE — Progress Notes (Signed)
DENTAL CONSULTATION  Date of Consultation:  07/19/2014 Patient Name:   Jamie Howell Date of Birth:   [birth year] Medical Record Number: [id number]  VITALS: BP 143/90 mmHg  Pulse 78  Temp(Src) 97.8 F (36.6 C) (Oral)  CHIEF COMPLAINT: Patient referred by Dr. Cornelius Moras for a pre-heart valve surgery dental protocol examination   HPI: Jamie Howell is a 64 year old female referred by Dr. Cornelius Moras for dental consultation. Patient with recent identification of severe aortic stenosis. Patient with anticipated aortic valve replacement. Patient is now seen as part of a medically necessary pre-heart valve surgery dental protocol examination.  The patient currently denies acute toothaches, swellings, or abscesses. Patient last saw a dentist in 1993 to have a tooth pulled. Patient denies any complications from that dental extraction. Patient indicates that she was seen by the dentist prior to her previous mitral valve replacement at that time.  Patient denies having any partial dentures. Patient does not seek regular dental care due to a history of dental phobia.  PROBLEM LIST: Patient Active Problem List   Diagnosis Date Noted  . Aortic valve stenosis     Priority: Medium  . Thrombocytopenia 07/22/2014  . Obesity (BMI 30-39.9) 07/08/2014  . Severe aortic stenosis 06/28/2014  . SOB (shortness of breath) 05/07/2014  . Moderate aortic insufficiency 01/19/2013  . Chronic diastolic CHF (congestive heart failure) 01/19/2013  . Pulmonary HTN 01/19/2013  . 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 OA (ocular albinism)   . HTN (hypertension)   . Thyrotoxicosis   . Hypothyroidism   . Mitral valve disorder   . Chronic recurrent paroxysmal atrial fibrillation   . S/P mitral valve replacement with metallic valve 04/08/1991    PMH: Past Medical History  Diagnosis Date  . HTN (hypertension)   . Thyrotoxicosis      without mention of goiter or other cause, without mention of thyrotoxic crisis or  storm  . Hypothyroidism   . 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 Atrial fibrillation   . Atrial flutter   . Heart disease   . Hematoma   . Subdural hematoma   . Pulmonary HTN     mild  . OA (ocular albinism)   . Aortic valve stenosis     moderate aortic valve stenosis with moderate aortic insufficiency, stable MVR, trivial PR, mild TR, severe LAE, grade II-III diastolic dysfunction,mild pulmonary HTN , EF 50% by echo 12/2012  . 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 Chronic recurrent paroxysmal atrial fibrillation   . Obesity (BMI 30-39.9) 07/08/2014    PSH: Past Surgical History  Procedure Laterality Date  . Total abdominal hysterectomy    . Mitral valve replacement  04/08/1991    31mm St Jude mechanical valve - Dr Andrey Campanile  . Left and right heart catheterization with coronary angiogram N/A 06/28/2014    Procedure: LEFT AND RIGHT HEART CATHETERIZATION WITH CORONARY ANGIOGRAM;  Surgeon: Tonny Bollman, MD;  Location: [facility] CATH LAB;  Service: Cardiovascular;  Laterality: N/A;  . Subdural hematoma evacuation via craniotomy  2000    Dr Dutch Quint    ALLERGIES: Allergies  Allergen Reactions  . Latex Rash    Reaction to heart monitor pads and bandaids (cloth tape ok)  . Penicillins Rash  . Sulfa Antibiotics Rash    MEDICATIONS: Current Outpatient Prescriptions  Medication Sig Dispense Refill  .  amLODipine (NORVASC) 2.5 MG tablet Take 1 tablet (2.5 mg total) by mouth daily. 30 tablet 5  . aspirin EC 81 MG tablet Take 1 tablet (81 mg total) by mouth daily. (Patient taking differently: Take 81 mg by mouth daily with lunch. )    . Calcium Carb-Cholecalciferol (CALCIUM 1000 + D PO) Take 1,000 mg by mouth daily with lunch.     . cyclobenzaprine (FLEXERIL) 10 MG tablet Take 5 mg by mouth at bedtime.     . furosemide (LASIX) 40 MG tablet Take 1 tablet  (40 mg total) by mouth 2 (two) times daily.    Marland Kitchen levothyroxine (SYNTHROID, LEVOTHROID) 150 MCG tablet Take 150 mcg by mouth daily before breakfast.    . metoprolol succinate (TOPROL-XL) 50 MG 24 hr tablet Take 1 tablet (50 mg total) by mouth daily. Take with or immediately following a meal. (Patient taking differently: Take 50 mg by mouth at bedtime. Take with or immediately following a meal.) 30 tablet 5  . potassium chloride (K-DUR) 10 MEQ tablet Take 10 mEq by mouth daily with lunch.     . warfarin (COUMADIN) 2.5 MG tablet Take 2.5-5 mg by mouth daily. Take 2 tablets (5 mg) on Wednesday and Friday, take 1 tablet (2.5 mg) on Sunday, Monday, Tuesday, Thursday, Saturday    . enoxaparin (LOVENOX) 40 MG/0.4ML injection Inject 40 mg into the skin See admin instructions. Inject 40 mg on Friday 06/25/14, Saturday 06/26/14 and Sunday 06/27/14 and as directed after procedure     No current facility-administered medications for this visit.    LABS: Lab Results  Component Value Date   WBC 4.1 06/28/2014   HGB 11.2* 06/28/2014   HCT 35.5* 06/28/2014   MCV 83.5 06/28/2014   PLT 59* 06/28/2014      Component Value Date/Time   NA 136 06/28/2014 0900   NA 136 06/08/2014 1415   K 3.7 06/28/2014 0900   K 3.7 06/08/2014 1415   CL 100 06/28/2014 0900   CO2 26 06/28/2014 0900   CO2 28 06/08/2014 1415   GLUCOSE 149* 06/28/2014 0900   GLUCOSE 254* 06/08/2014 1415   BUN 18 06/28/2014 0900   BUN 25.3 06/08/2014 1415   CREATININE 1.17* 06/28/2014 0900   CREATININE 1.2* 06/08/2014 1415   CALCIUM 9.3 06/28/2014 0900   CALCIUM 8.8 06/08/2014 1415   GFRNONAA 48* 06/28/2014 0900   GFRAA 56* 06/28/2014 0900   Lab Results  Component Value Date   INR 1.22 06/28/2014   INR 1.16 08/02/2009   INR 1.03 08/01/2009   No results found for: PTT  SOCIAL HISTORY: History   Social History  . Marital Status: Single    Spouse Name: N/A  . Number of Children: 2  . Years of Education: N/A   Occupational  History  . circulation desk clerk    Social History Main Topics  . Smoking status: Never Smoker   . Smokeless tobacco: Never Used  . Alcohol Use: No  . Drug Use: No  . Sexual Activity: Not on file   Other Topics Concern  . Not on file   Social History Narrative    FAMILY HISTORY: Family History  Problem Relation Age of Onset  . CAD Father   . Hypertension Father   . CAD Mother   . Cirrhosis Brother   . Heart attack Sister     REVIEW OF SYSTEMS: Reviewed with the patient included in dental record.  DENTAL HISTORY: CHIEF COMPLAINT: Patient referred by Dr. Cornelius Moras for a  pre-heart valve surgery dental protocol examination   HPI: Jamie Howell is a 64 year old female referred by Dr. Cornelius Moraswen for dental consultation. Patient with recent identification of severe aortic stenosis. Patient with anticipated aortic valve replacement. Patient is now seen as part of a medically necessary pre-heart valve surgery dental protocol examination.  The patient currently denies acute toothaches, swellings, or abscesses. Patient last saw a dentist in 1993 to have a tooth pulled. Patient denies any complications from that dental extraction. Patient indicates that she was seen by the dentist prior to her previous mitral valve replacement at that time.  Patient denies having any partial dentures. Patient does not seek regular dental care due to a history of dental phobia.  DENTAL EXAMINATION: GENERAL: The patient is a well-developed, well-nourished female in no acute distress. HEAD AND NECK: There is no palpable submandibular lymphadenopathy. The patient denies acute TMJ symptoms. INTRAORAL EXAM: The patient has xerostomia. Patient has a small palatal torus. There is atrophy of the edentulous alveolar ridges. Maximum interincisal opening is 48 mm. DENTITION: Patient is missing tooth numbers 1, 2, 6, 8, 14, 15, 16, 18, 19, 20, 30, and 31. The patient has retained root segments in the area of tooth numbers  3, 5, 7, 10, 11, 12, 21. PERIODONTAL: Patient has chronic periodontitis with plaque and calculus accumulations, generalized gingival recession, and generalized tooth mobility. There is moderate to severe bone loss noted. Radiographic calculus is noted. DENTAL CARIES/SUBOPTIMAL RESTORATIONS: Patient has multiple dental caries as per dental charting form. ENDODONTIC: She currently denies acute pulpitis symptoms. Patient does have multiple areas of periapical pathology and radiolucency. CROWN AND BRIDGE: There are no crown or bridge restorations noted. PROSTHODONTIC: Patient denies having any partial dentures. OCCLUSION: Patient has a poor occlusal scheme secondary to multiple missing teeth, multiple retained root segments, supra-eruption and drifting of the unopposed teeth into the edentulous areas, and lack of replacement of missing teeth with dental prostheses.  RADIOGRAPHIC INTERPRETATION: An orthopantogram was taken on 07/19/2014 and this was supplemented 12 periapical radiographs. There are multiple missing teeth. There are multiple retained root segments. There are extensive dental caries. There are multiple areas of periapical pathology and radiolucency. There is supra-eruption and drifting of the unopposed teeth into the edentulous areas. Radiographic calculus is noted. There is moderate to severe bone loss noted.  ASSESSMENTS: 1. Severe aortic stenosis 2. Status post mitral valve replacement 3. Chronic anticoagulation 4. Chronic thrombocytopenia 5. Significant risk for bleeding with invasive dental procedures 6. Pre-heart valve surgery dental protocol examination 7. Chronic apical periodontitis 8. Dental caries 9. Multiple retained root segments 10. Chronic periodontitis with bone loss 11. Gingival recession 12. Accretions 13. Tooth mobility 14. Multiple missing teeth 15. Supra-eruption and drifting of the unopposed teeth into the edentulous areas 16. Poor occlusal scheme and  malocclusion 17. No history of partial dentures 18. Xerostomia 17. Dental phobia 19. Chronic anticoagulation with need for bridging with Lovenox therapy after being taken off warfarin therapy prior to invasive dental procedures 20. Need for premedication with antibiotics prior to invasive dental procedures 21. Risk for complications up to and including death due to overall cardiovascular compromise.  PLAN/RECOMMENDATIONS: 1. I discussed the risks, benefits, and complications of various treatment options with the patient in relationship to her medical and dental conditions, previous heart valve surgery, anticipated heart valve surgery, chronic thrombocytopenia , and risk for endocarditis and significant bleeding. We discussed various treatment options to include no treatment, multiple extractions with alveoloplasty, pre-prosthetic surgery as indicated, periodontal therapy,  dental restorations, root canal therapy, crown and bridge therapy, implant therapy, and replacement of missing teeth as indicated. The patient currently wishes to proceed with extraction of remaining teeth with alveoloplasty in the operating room with general anesthesia. After discussion with Dr. Excell Seltzer, it was determined that patient will need Lovenox bridging and is agreeable discontinue aspirin therapy 5-7 days before invasive dental procedures. The Lovenox therapy will be coordinated with [facility] warfarin clinic. The patient may require platelet transfusion prior to invasive dental procedures and hematologist will be contacted concerning platelet transfusion. We will plan an overnight admission for observation with discharge the following day as indicated.   2. Discussion of findings with medical team and coordination of future medical and dental care as needed.  I spent in excess of  120 minutes during the conduct of this consultation and >50% of this time involved direct face-to-face encounter for counseling and/or coordination of  the patient's care.    Charlynne Pander, DDS

## 2014-07-19 NOTE — Patient Instructions (Signed)
[place]    Department of Dental Medicine     DR. KULINSKI      HEART VALVES AND MOUTH CARE:  FACTS:   If you have any infection in your mouth, it can infect your heart valve.  If you heart valve is infected, you will be seriously ill.  Infections in the mouth can be SILENT and do not always cause pain.  Examples of infections in the mouth are gum disease, dental cavities, and abscesses.  Some possible signs of infection are: Bad breath, bleeding gums, or teeth that are sensitive to sweets, hot, and/or cold. There are many other signs as well.  WHAT YOU HAVE TO DO:   Brush your teeth after meals and at bedtime. Spend at least 2 minutes brushing well, especially behind your back teeth and all around your teeth that stand alone. Brush at the gumline also.  Do not go to bed without brushing your teeth and flossing.  If you gums bleed when you brush or floss, do NOT stop brushing or flossing. It usually means that your gums need more attention and better cleaning.   If your Dentist or Dr. Kulinski gave you a prescription mouthwash to use, make sure to use it as directed. If you run out of the medication, get a refill at the pharmacy.   If you were given any other medications or directions by your Dentist, please follow them. If you did not understand the directions or forget what you were told, please call. We will be happy to refresh her memory.  If you need antibiotics before dental procedures, make sure you take them one hour prior to every dental visit as directed.   Get a dental checkup every 4-6 months in order to keep your mouth healthy, or to find and treat any new infection. You will most likely need your teeth cleaned or gums treated at the same time.  If you are not able to come in for your scheduled appointment, call your Dentist as soon as possible to reschedule.  If you have a problem in between dental visits, call your Dentist.  

## 2014-07-22 ENCOUNTER — Other Ambulatory Visit (HOSPITAL_COMMUNITY): Payer: Self-pay | Admitting: Dentistry

## 2014-07-22 DIAGNOSIS — D696 Thrombocytopenia, unspecified: Secondary | ICD-10-CM | POA: Insufficient documentation

## 2014-07-26 ENCOUNTER — Telehealth: Payer: Self-pay | Admitting: Pharmacist

## 2014-07-26 ENCOUNTER — Other Ambulatory Visit: Payer: Self-pay | Admitting: *Deleted

## 2014-07-26 ENCOUNTER — Ambulatory Visit (INDEPENDENT_AMBULATORY_CARE_PROVIDER_SITE_OTHER): Payer: 59 | Admitting: Thoracic Surgery (Cardiothoracic Vascular Surgery)

## 2014-07-26 ENCOUNTER — Encounter: Payer: Self-pay | Admitting: Thoracic Surgery (Cardiothoracic Vascular Surgery)

## 2014-07-26 VITALS — BP 133/69 | HR 87 | Resp 16 | Ht 59.0 in | Wt 183.0 lb

## 2014-07-26 DIAGNOSIS — I350 Nonrheumatic aortic (valve) stenosis: Secondary | ICD-10-CM

## 2014-07-26 DIAGNOSIS — I351 Nonrheumatic aortic (valve) insufficiency: Secondary | ICD-10-CM | POA: Diagnosis not present

## 2014-07-26 DIAGNOSIS — Z954 Presence of other heart-valve replacement: Secondary | ICD-10-CM

## 2014-07-26 DIAGNOSIS — I8500 Esophageal varices without bleeding: Secondary | ICD-10-CM

## 2014-07-26 DIAGNOSIS — K766 Portal hypertension: Secondary | ICD-10-CM

## 2014-07-26 DIAGNOSIS — I059 Rheumatic mitral valve disease, unspecified: Secondary | ICD-10-CM

## 2014-07-26 DIAGNOSIS — I5032 Chronic diastolic (congestive) heart failure: Secondary | ICD-10-CM | POA: Diagnosis not present

## 2014-07-26 DIAGNOSIS — R161 Splenomegaly, not elsewhere classified: Secondary | ICD-10-CM

## 2014-07-26 NOTE — Progress Notes (Signed)
[facility]   MULTIDISCIPLINARY HEART VALVE CLINIC       CARDIOTHORACIC SURGERY NOTE  Referring Provider is Tonny Bollmanooper, Michael, MD PCP is Londell MohPHARR,[name], MD   HPI:  Patient returns to the office today for follow-up of severe symptomatic aortic stenosis with underlying long-standing rheumatic heart disease and chronic diastolic congestive heart failure. She was originally seen in consultation on 07/08/2014. Since then she underwent CT angiography to further characterize the potential anatomical feasibility of transcatheter aortic valve replacement as an alternative to high-risk conventional surgery. The patient has additionally been seen in consultation by Dr. Robin SearingKulinsky who plans dental extraction in the operating room next week. The patient reports no new problems or complaints over the past 2 weeks.  She describes stable symptoms of exertional shortness of breath and fatigue with very low level exertion, consistent with chronic diastolic congestive heart failure New York Heart Association functional class III.   Current Outpatient Prescriptions  Medication Sig Dispense Refill  . amLODipine (NORVASC) 2.5 MG tablet Take 1 tablet (2.5 mg total) by mouth daily. 30 tablet 5  . aspirin EC 81 MG tablet Take 1 tablet (81 mg total) by mouth daily. (Patient taking differently: Take 81 mg by mouth daily with lunch. )    . Calcium Carb-Cholecalciferol (CALCIUM 1000 + D PO) Take 1,000 mg by mouth daily with lunch.     . cyclobenzaprine (FLEXERIL) 10 MG tablet Take 5 mg by mouth at bedtime.     . furosemide (LASIX) 40 MG tablet Take 1 tablet (40 mg total) by mouth 2 (two) times daily.    Marland Kitchen. levothyroxine (SYNTHROID, LEVOTHROID) 150 MCG tablet Take 150 mcg by mouth daily before breakfast.    . metoprolol succinate (TOPROL-XL) 50 MG 24 hr tablet Take 1 tablet (50 mg total) by mouth daily. Take with or immediately following a meal. (Patient taking differently: Take 50 mg by mouth at  bedtime. Take with or immediately following a meal.) 30 tablet 5  . potassium chloride (K-DUR) 10 MEQ tablet Take 10 mEq by mouth daily with lunch.     . warfarin (COUMADIN) 2.5 MG tablet Take 2.5-5 mg by mouth daily. Take 2 tablets (5 mg) on Wednesday and Friday, take 1 tablet (2.5 mg) on Sunday, Monday, Tuesday, Thursday, Saturday     No current facility-administered medications for this visit.      Physical Exam:   BP 133/69 mmHg  Pulse 87  Resp 16  Ht 4\' 11"  (1.499 m)  Wt 183 lb (83.008 kg)  BMI 36.94 kg/m2  SpO2 97%  General:  Obese but well appearing  Chest:   Clear to auscultation  CV:   Regular rate and rhythm with mechanical heart valve sounds and grade 3/6 systolic murmur  Incisions:  n/a  Abdomen:  Soft and nontender  Extremities:  Warm and well-perfused  Diagnostic Tests:  Cardiac TAVR CT  TECHNIQUE: The patient was scanned on a Philips 256 scanner. A 120 kV retrospective scan was triggered in the descending thoracic aorta at 111 HU's. Gantry rotation speed was 270 msecs and collimation was .9 mm. No beta blockade or nitro were given. The 3D data set was reconstructed in 5% intervals of the R-R cycle. Systolic and diastolic phases were analyzed on a dedicated work station using MPR, MIP and VRT modes. The patient received 80 cc of contrast.  FINDINGS: Aortic Valve: Trileaflet non coronary cusp less calcified. Annulus is more spherical than usual with no calcium.  There is a normal  functioning bileaflet mechanical Mitral valve  There is severe biatrial enlargement  Sinotubular Junction: 2.9 cm  Ascending Thoracic Aorta: 3.4 cm  Descending Thoracic Aorta: 1.9 cm  Sinus of Valsalva Measurements:  Non-coronary: 34 mm  Right -coronary: 32 mm  Left -coronary: 30 mm  Coronary Artery Height above Annulus:  Left Main: 15.8 mm  Right Coronary: 14.8 mm  Virtual Basal Annulus Measurements:  Maximum/Minimum Diameter: 26.7 mm x  23 mm more spherical than usual  Area: 534 mm2  Optimum Fluoroscopic Angle for Delivery: RAO 6 degrees Cranial 0 degrees  IMPRESSION: 1) Calcified trileaflet aortic valve. Spherical non calcified annulus measures 534 mm2 suitable for underinflated 29 mm Sapien 3  2) LVOT not narrow measures 2.5 cm with no anterior mitral leaflet due to MVR  3) Normal appearing bileaflet mechanical MVR  4) Severe biatrial enlargement  5) Suitable coronary height for delivery of valve  6) Optimum Angiographic angle for delivery RAO 6 degrees Cranial 0 degrees  Charlton Haws   Electronically Signed  By: Charlton Haws M.D.  On: 07/14/2014 17:55      Study Result     EXAM: OVER-READ INTERPRETATION CT CHEST  The following report is an over-read performed by radiologist Dr. Irma Newness [facility] on 07/14/2014. This over-read does not include interpretation of cardiac or coronary anatomy or pathology. The coronary CTA interpretation by the cardiologist is attached.  COMPARISON: Portable chest 07/01/2003. This study was performed in conjunction with a routine CTA of the chest, abdomen and pelvis.  FINDINGS: Small mediastinal and hilar lymph nodes are better seen on accompanying chest CTA. There is a small hiatal hernia.  Cardiovascular findings are reported separately.  The lungs demonstrate patchy ground-glass opacities with mosaic attenuation. No endobronchial lesion or confluent airspace opacity identified.  The visualized upper abdomen is notable for contour irregularity of the liver suspicious for cirrhosis. The spleen appears enlarged.  IMPRESSION: 1. Mosaic attenuation in the lungs, likely related to small airways disease. 2. Nonspecific small mediastinal lymph nodes, not pathologically enlarged. 3. Probable hepatic cirrhosis and portal hypertension manifesting as splenomegaly. 4. Cardiovascular findings are dictated  separately. In addition, chest CTA findings dictated separately.  Electronically Signed: By: Carey Bullocks M.D. On: 07/14/2014 12:14      CT ANGIOGRAPHY CHEST, ABDOMEN AND PELVIS  TECHNIQUE: Multidetector CT imaging through the chest, abdomen and pelvis was performed using the standard protocol during bolus administration of intravenous contrast. Multiplanar reconstructed images and MIPs were obtained and reviewed to evaluate the vascular anatomy.  CONTRAST: 70mL OMNIPAQUE IOHEXOL 350 MG/ML SOLN  COMPARISON: None.  FINDINGS: CTA CHEST FINDINGS  Mediastinum/Lymph Nodes: There multiple borderline enlarged and mildly enlarged mediastinal lymph nodes, largest of which are in the prevascular nodal station measuring 11 mm in short axis, and in the high right paratracheal nodal station measuring up to 13 mm in short axis. Cardiomegaly with left atrial dilatation. Mechanical mitral valve. Extensive calcifications of the mitral annulus and subvalvular apparatus. Extensive calcifications of the aortic valve. There is no significant pericardial fluid, thickening or pericardial calcification. Esophagus is unremarkable in appearance. Multiple large paraesophageal varices, particularly adjacent to the gastroesophageal junction. No axillary lymphadenopathy.  Lungs/Pleura: Mosaic attenuation throughout the lung parenchyma suggestive a significant air trapping. No consolidative airspace disease. No pleural effusions. No suspicious appearing pulmonary nodules or masses.  Musculoskeletal/Soft Tissues: Median sternotomy wires. There are no aggressive appearing lytic or blastic lesions noted in the visualized portions of the skeleton.  CTA ABDOMEN AND PELVIS FINDINGS  Hepatobiliary:  The liver has a shrunken appearance and nodular contour, compatible with underlying cirrhosis. No definite suspicious cystic or solid hepatic lesion identified. Status post cholecystectomy. No  intra or extrahepatic biliary ductal dilatation. Portal vein is mildly dilated measuring up to 15 mm in diameter.  Pancreas: Unremarkable.  Spleen: Spleen is enlarged measuring 15.3 x 7.9 x 18.5 cm (estimated splenic volume of 1,118 mL).  Adrenals/Urinary Tract: Bilateral adrenal glands are normal in appearance. Mild multifocal cortical thinning in the kidneys bilaterally, presumably chronic post infectious or inflammatory scarring.  Stomach/Bowel: The appearance of the stomach is normal. No pathologic dilatation of small bowel or colon. Numerous colonic diverticulae are present, particularly in the sigmoid colon, without surrounding inflammatory changes to suggest an acute diverticulitis at this time. Normal appendix.  Vascular/Lymphatic: Vascular findings and measurements pertinent to potential TAVR procedure, as below. Multiple dilated veins in the upper thighs bilaterally (left greater than right), with the largest varix on the right side immediately anterior to the distal common femoral artery measuring up to 4.0 x 4.4 cm. Multiple borderline enlarged retroperitoneal and mesenteric lymph nodes.  Reproductive: Status post hysterectomy. Ovaries are unremarkable in appearance.  Other: Trace volume of ascites. Small epigastric ventral hernia containing some ascites and omental fat.  Musculoskeletal: There are no aggressive appearing lytic or blastic lesions noted in the visualized portions of the skeleton.  VASCULAR MEASUREMENTS PERTINENT TO TAVR:  AORTA:  Minimal Aortic Diameter - 13 x 13 mm  Severity of Aortic Calcification - mild  RIGHT PELVIS:  Right Common Iliac Artery -  Minimal Diameter - 9.2 x 8.9 mm  Tortuosity - mild  Calcification - mild  Right External Iliac Artery -  Minimal Diameter - 7.5 x 7.2 mm  Tortuosity - mild  Calcification - none  Right Common Femoral Artery -  Minimal Diameter - 6.2 x 6.7 mm  Tortuosity  - mild  Calcification - none  LEFT PELVIS:  Left Common Iliac Artery -  Minimal Diameter - 8.3 x 8.1 mm  Tortuosity - mild  Calcification - mild  Left External Iliac Artery -  Minimal Diameter - 6.5 x 6.7 mm  Tortuosity - mild  Calcification - none  Left Common Femoral Artery -  Minimal Diameter - 6.3 x 6.5 mm  Tortuosity - mild  Calcification - none  Review of the MIP images confirms the above findings.  IMPRESSION: 1. Vascular findings and measurements pertinent to potential TAVR procedure, as detailed above. This patient does appear to have suitable pelvic arterial access. 2. However, this patient has multiple large venous varices, many of which overlie deep pelvic access vessels. Most notably, the largest venous varix is immediately superficial to the right common femoral vein. 3. Stigmata of cirrhosis with portal hypertension, including large esophageal varices, and splenomegaly. 4. Colonic diverticulosis without findings to suggest acute diverticulitis at this time. 5. Additional incidental findings, as above.   Electronically Signed  By: Trudie Reed M.D.  On: 07/19/2014 14:30   Impression:  Patient has stage D severe symptomatic aortic stenosis. She presents with a several month history of worsening symptoms of exertional shortness of breath and fatigue consistent with chronic diastolic congestive heart failure, currently New York Heart Association functional class 3. 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 I have personally reviewed the patient's recent transthoracic echocardiogram and diagnostic cardiac catheterization. The aortic valve appears tricuspid with severe thickening and restricted leaflet mobility involving all 3 leaflets.  There is mild aortic insufficiency. Peak velocity across the aortic valve measured  3.9 m/s corresponding to estimated mean transvalvular gradient of approximately 40 mmHg. The diameter of the left ventricular outflow tract was measured 1.9 cm. The mitral valve prosthesis appears to be functioning normally. The patient additionally has moderate pulmonary hypertension and moderate tricuspid regurgitation. Cardiac gated CT angiogram of the heart demonstrates anatomical characteristics that appear relatively favorable for transcatheter aortic valve replacement. There is not much annular calcification, there appears to be adequate coronary height and sinotubular dimensions, and the overall size of the aortic annulus appears more than adequate for at least a 26 mm transcatheter valve. CT angiogram of the aorta and iliac vessels demonstrates adequate pelvic arterial access for a transfemoral approach. Of note, CT angiogram also demonstrates the presence of significant portal hypertension with esophageal varices and splenomegaly.  Risks associated with conventional surgical aortic valve replacement through a redo median sternotomy would clearly be relatively high.  Under the circumstances I feel that transcatheter aortic valve replacement would be a far less risky treatment alternative, although the patient's relatively young age raises questions about the long-term durability of currently available transcatheter heart valves.    Plan:  I have discussed the results of the patient's recent CT angiograms with the patient and her friend here in the office today. The relative risks and benefits of transcatheter aortic valve replacement were compared and contrasted with redo median sternotomy for conventional surgical aortic valve replacement. The patient will contemplate treatment options further and proceed with dental extraction next week as previously scheduled. Her case will be reviewed by a multidisciplinary team of specialists to discuss options further. The patient will return in 3 weeks for  follow-up. In the meanwhile she will undergo formal pulmonary function tests and a physical therapy evaluation.   I spent in excess of 30 minutes during the conduct of this office consultation and >50% of this time involved direct face-to-face encounter with the patient for counseling and/or coordination of their care.    Salvatore Decent. Cornelius Moras, MD 07/26/2014 10:44 AM

## 2014-07-26 NOTE — Telephone Encounter (Signed)
Received following message from Dr. Kristin BruinsKulinski:   Patient is scheduled for extraction of remaining teeth with alveoloplasty in the operating room with general anesthesia on 08/05/2014. Patient will need to have warfarin therapy discontinued and bridging with Lovenox. Kennon RoundsSally: Can you please coordinate this with the patient and Dr. Excell Seltzerooper as indicated. Please have patient stop aspirin 5 days before dental procedure as well.     Will plan on having transfusion with platelets at the time of dental surgery per discussion with Dr. Mosetta PuttFeng.    Will plan on admission for overnight observation at that time.    Dr. Kristin BruinsKulinski     Pt's anticoagulation is managed by Dr. Carolee RotaPharr's office.  LM for pharmacist to call to discuss bridging pt.

## 2014-07-26 NOTE — Patient Instructions (Signed)
Schedule pulmonary function tests and physical therapy consults as soon as practical

## 2014-07-27 NOTE — Telephone Encounter (Signed)
Spoke with Truddie Crumblearin, PharmD at Dr. Carolee RotaPharr's office.  They monitor warfarin for her and will manage lovenox bridge.  Informed her of date of surgery and that they will be giving patient platelets.  She is scheduled to stay overnight for observation.    Truddie CrumbleCarin will have patient stop ASA and warfarin, set up a lovenox bridge and monitor patient before and after.

## 2014-07-31 NOTE — Pre-Procedure Instructions (Signed)
Jamie Howell  07/31/2014   Your procedure is scheduled on:  May 5  [facility] at 05:30 AM.  Call this number if you have problems the morning of surgery: [phone]   Remember:   Do not eat food or drink liquids after midnight.   Take these medicines the morning of surgery with A SIP OF WATER: Amlodipine, Levothyroxine,    STOP Calcium today   STOP/ Do not take Aleve, Naproxen, Advil, Ibuprofen, Motrin, Vitamins, Herbs, or Supplements starting today   Do not wear jewelry, make-up or nail polish.  Do not wear lotions, powders, or perfumes. You may wear deodorant.  Do not shave 48 hours prior to surgery. Men may shave face and neck.  Do not bring valuables to the hospital.  [facility] is not responsible for any belongings or valuables.               Contacts, dentures or bridgework may not be worn into surgery.  Leave suitcase in the car. After surgery it may be brought to your room.  For patients admitted to the hospital, discharge time is determined by your treatment team.               Special Instructions: [place] - Preparing for Surgery  Before surgery, you can play an important role.  Because skin is not sterile, your skin needs to be as free of germs as possible.  You can reduce the number of germs on you skin by washing with CHG (chlorahexidine gluconate) soap before surgery.  CHG is an antiseptic cleaner which kills germs and bonds with the skin to continue killing germs even after washing.  Please DO NOT use if you have an allergy to CHG or antibacterial soaps.  If your skin becomes reddened/irritated stop using the CHG and inform your nurse when you arrive at Short Stay.  Do not shave (including legs and underarms) for at least 48 hours prior to the first CHG shower.  You may shave your face.  Please follow these instructions carefully:   1.  Shower with CHG Soap the night before surgery and the morning of Surgery.  2.  If you  choose to wash your hair, wash your hair first as usual with your normal shampoo.  3.  After you shampoo, rinse your hair and body thoroughly to remove the shampoo.  4.  Use CHG as you would any other liquid soap.  You can apply CHG directly to the skin and wash gently with scrungie or a clean washcloth.  5.  Apply the CHG Soap to your body ONLY FROM THE NECK DOWN.  Do not use on open wounds or open sores.  Avoid contact with your eyes, ears, mouth and genitals (private parts).  Wash genitals (private parts) with your normal soap.  6.  Wash thoroughly, paying special attention to the area where your surgery will be performed.  7.  Thoroughly rinse your body with warm water from the neck down.  8.  DO NOT shower/wash with your normal soap after using and rinsing off the CHG Soap.  9.  Pat yourself dry with a clean towel.            10.  Wear clean pajamas.            11.  Place clean sheets on your bed the night of your first shower and do not sleep with pets.  Day of Surgery  Do not  apply any lotions the morning of surgery.  Please wear clean clothes to the hospital/surgery center.     Please read over the following fact sheets that you were given: Pain Booklet, Coughing and Deep Breathing and Surgical Site Infection Prevention

## 2014-08-02 ENCOUNTER — Encounter (HOSPITAL_COMMUNITY): Payer: Self-pay

## 2014-08-02 ENCOUNTER — Encounter (HOSPITAL_COMMUNITY)
Admission: RE | Admit: 2014-08-02 | Discharge: 2014-08-02 | Disposition: A | Discharge status: Home / Self Care | Payer: 59 | Source: Ambulatory Visit | Attending: Dentistry | Admitting: Dentistry

## 2014-08-02 DIAGNOSIS — I350 Nonrheumatic aortic (valve) stenosis: Secondary | ICD-10-CM | POA: Diagnosis not present

## 2014-08-02 DIAGNOSIS — R062 Wheezing: Secondary | ICD-10-CM | POA: Diagnosis not present

## 2014-08-02 DIAGNOSIS — R0609 Other forms of dyspnea: Secondary | ICD-10-CM | POA: Diagnosis not present

## 2014-08-02 HISTORY — DX: Thrombocytopenia, unspecified: D69.6

## 2014-08-02 LAB — BASIC METABOLIC PANEL
ANION GAP: 7 (ref 5–15)
BUN: 18 mg/dL (ref 6–20)
CALCIUM: 8.8 mg/dL — AB (ref 8.9–10.3)
CHLORIDE: 103 mmol/L (ref 101–111)
CO2: 26 mmol/L (ref 22–32)
Creatinine, Ser: 1.04 mg/dL — ABNORMAL HIGH (ref 0.44–1.00)
GFR calc Af Amer: >60 mL/min (ref >60)
GFR calc non Af Amer: 56 mL/min — ABNORMAL LOW (ref >60)
Glucose, Bld: 221 mg/dL — ABNORMAL HIGH (ref 70–99)
Potassium: 4 mmol/L (ref 3.5–5.1)
SODIUM: 136 mmol/L (ref 135–145)

## 2014-08-02 LAB — PROTIME-INR
INR: 2.17 — ABNORMAL HIGH (ref 0.00–1.49)
Prothrombin Time: 24.4 seconds — ABNORMAL HIGH (ref 11.6–15.2)

## 2014-08-02 LAB — CBC
HCT: 30.9 % — ABNORMAL LOW (ref 36.0–46.0)
HEMOGLOBIN: 9.9 g/dL — AB (ref 12.0–15.0)
MCH: 27.1 pg (ref 26.0–34.0)
MCHC: 32 g/dL (ref 30.0–36.0)
MCV: 84.7 fL (ref 78.0–100.0)
PLATELETS: 48 10*3/uL — AB (ref 150–400)
RBC: 3.65 MIL/uL — ABNORMAL LOW (ref 3.87–5.11)
RDW: 17.3 % — ABNORMAL HIGH (ref 11.5–15.5)
WBC: 3.2 10*3/uL — ABNORMAL LOW (ref 4.0–10.5)

## 2014-08-02 LAB — APTT: aPTT: 44 seconds — ABNORMAL HIGH (ref 24–37)

## 2014-08-02 NOTE — Progress Notes (Signed)
   08/02/14 0909  OBSTRUCTIVE SLEEP APNEA  Have you ever been diagnosed with sleep apnea through a sleep study? No  Do you snore loudly (loud enough to be heard through closed doors)?  1  Do you often feel tired, fatigued, or sleepy during the daytime? 0  Has anyone observed you stop breathing during your sleep? 0  Do you have, or are you being treated for high blood pressure? 1  BMI more than 35 kg/m2? 1  Age over 64 years old? 1  Neck circumference greater than 40 cm/16 inches? 1  Gender: 0

## 2014-08-02 NOTE — Progress Notes (Addendum)
PCP is Dr Merri BrunetteWalter [name] Cardiologist is Dr Mayford Knifeurner, but now sees Dr Excell Seltzerooper. Echo noted in epic from 05-12-14. Card cath noted in epic from 06-28-14.  States she had a stress test done 4-5 years ago. EKG and CT chest noted in epic  Note in epic that pt will need platelets on day of surgery, Type and screen order placed. Denies any chest pain, states she does have shortness of breath with climbing stairs. Pt states she has started her Lovenox bridge.

## 2014-08-03 ENCOUNTER — Ambulatory Visit (HOSPITAL_COMMUNITY)
Admission: RE | Admit: 2014-08-03 | Discharge: 2014-08-03 | Disposition: A | Discharge status: Home / Self Care | Payer: 59 | Source: Ambulatory Visit | Attending: Thoracic Surgery (Cardiothoracic Vascular Surgery) | Admitting: Thoracic Surgery (Cardiothoracic Vascular Surgery)

## 2014-08-03 DIAGNOSIS — R0609 Other forms of dyspnea: Secondary | ICD-10-CM | POA: Diagnosis not present

## 2014-08-03 DIAGNOSIS — I350 Nonrheumatic aortic (valve) stenosis: Secondary | ICD-10-CM | POA: Insufficient documentation

## 2014-08-03 DIAGNOSIS — R062 Wheezing: Secondary | ICD-10-CM | POA: Insufficient documentation

## 2014-08-03 LAB — PULMONARY FUNCTION TEST
FEF 25-75 Post: 1.02 L/sec
FEF 25-75 Pre: 1.62 L/sec
FEF2575-%CHANGE-POST: -37 %
FEF2575-%Pred-Post: 53 %
FEF2575-%Pred-Pre: 85 %
FEV1-%CHANGE-POST: -11 %
FEV1-%PRED-POST: 56 %
FEV1-%PRED-PRE: 64 %
FEV1-PRE: 1.27 L
FEV1-Post: 1.12 L
FEV1FVC-%CHANGE-POST: -14 %
FEV1FVC-%PRED-PRE: 110 %
FEV6-%Change-Post: 3 %
FEV6-%Pred-Post: 61 %
FEV6-%Pred-Pre: 59 %
FEV6-Post: 1.54 L
FEV6-Pre: 1.49 L
FEV6FVC-%Pred-Post: 104 %
FEV6FVC-%Pred-Pre: 104 %
FVC-%Change-Post: 3 %
FVC-%Pred-Post: 59 %
FVC-%Pred-Pre: 57 %
FVC-Post: 1.54 L
FVC-Pre: 1.49 L
POST FEV1/FVC RATIO: 73 %
PRE FEV6/FVC RATIO: 100 %
Post FEV6/FVC ratio: 100 %
Pre FEV1/FVC ratio: 85 %
RV % PRED: 118 %
RV: 2.16 L
TLC % PRED: 85 %
TLC: 3.68 L

## 2014-08-03 MED ORDER — ALBUTEROL SULFATE (2.5 MG/3ML) 0.083% IN NEBU
2.5000 mg | INHALATION_SOLUTION | Freq: Once | RESPIRATORY_TRACT | Status: AC
  Administered 2014-08-03: 2.5 mg via RESPIRATORY_TRACT

## 2014-08-03 NOTE — Progress Notes (Signed)
Anesthesia Chart Review:  Pt is 64 year old female scheduled for multiple dental extractions with alveoloplasty on 08/05/2014 with Dr. Kristin BruinsKulinski.   PCP is Dr. Merri BrunetteWalter [name]. Cardiologist is Dr. Excell Seltzerooper.   PMH includes: HTN, atrial fibrillation, atrial flutter, rheumatic heart disease, severe aortic valve stenosis, pulmonary HTN, s/p mitral valve replacement with metallic valve (1993), hypothyroidism, thrombocytopenia,  portal hypertension with esophageal varices, splenomegaly, ocular albinism. Never smoker. BMI 38.   Medications include: coumadin. Pt is starting lovenox bridge.   Preoperative labs reviewed.  PT/INR 24.4/2.17. PTT 44. PT/PTT will be repeated DOS.   H/H 9.9/30.9. Saw Dr. Mosetta PuttFeng with hematology on 06/08/2014. Thought to have anemia of chronic disease.   Platelets 48. Pt has known chronic thrombocytopenia. Saw Dr. Mosetta PuttFeng with hematology on 06/08/2014, cleared for cardiac cath and valvular replacement surgery. Discussed platelets with Dr. Jean RosenthalJackson. Platelets ordered for 7am DOS.   EKG 06/28/2014: Sinus rhythm with frequent PVCs in a pattern of trigeminy. ST & T wave abnormality, consider lateral ischemia.  Possible Inferior infarct, age undetermined. Prolonged QT  Cardiac cath 06/28/2014: 1. Widely patent coronary arteries 2. Status post mechanical mitral valve replacement with normal motion of the bileaflet mechanical prosthesis by fluoroscopy 3. Severely calcified native aortic valve with restricted valve leaflet mobility and known severe aortic stenosis by echo criteria 4. Elevated right and left heart intracardiac filling pressures   Recommendations: cardiac surgical evaluation for consideration of redo sternotomy/conventional aortic valve replacement versus TAVR   Echo 05/12/2014: - Left ventricle: The cavity size was normal. There was moderate concentric hypertrophy. Systolic function was mildly reduced. Theestimated ejection fraction was in the range of 45% to 50%. Inferior  hypokinesis. The study is not technically sufficient to allow evaluation of LV diastolic function. - Aortic valve: Heavily calcified with restricted leaflet motion. Severe aortic stenosis - peak and mean gradients of 60 mmHG and 40 mmHG, respectivley. Based on an LVOT diameter of 1.9 cm, the calculated AVA is 0.6 cm2. There is mild to moderate regurgitation. Regurgitation pressure half-time: 292 ms. - Mitral valve: Mechanical tilting bileaflet valve. Peak and mean gradients of 15 and 5 mmHg. Normal leaflet motion, no evidence for obstruction or paravalvular leak. There was mild regurgitation. - Left atrium: Severely dilated at 65 ml/m2. - Right ventricle: The cavity size was mildly dilated. - Right atrium: Moderately dilated at 24 cm2. - Atrial septum: No defect or patent foramen ovale was identified. - Tricuspid valve: There was moderate regurgitation. - Pulmonary arteries: PA peak pressure: 54 mm Hg (S). - Systemic veins: The IVC is <2.1 cm, but does not collapse >50%, suggesting an elevated RA pressure of 8 mmHg.  Impressions: - Compared to the prior study in 12/2013, there is now inferior hypokinesis with EF around 45%, normally functioning mitral valveprosthesis. There is severe aortic stenosis with heavy calcification and AVA around 0.6 cm2 - mean gradient of 40 mmHg. There is moderate AI and moderate TR, RVSP is 54 mmHg, severe LAEand moderate RAE.  If no changes, I anticipate pt can proceed with surgery as scheduled.   Rica Mastngela Berkley Cronkright, FNP-BC [facility] Phone: [phone] 08/03/2014 4:21 PM

## 2014-08-04 MED ORDER — CLINDAMYCIN PHOSPHATE 600 MG/50ML IV SOLN
600.0000 mg | INTRAVENOUS | Status: AC
  Filled 2014-08-04: qty 50

## 2014-08-04 NOTE — Anesthesia Preprocedure Evaluation (Addendum)
Anesthesia Evaluation  Patient identified by MRN, date of birth, ID band Patient awake    Reviewed: Allergy & Precautions, NPO status , Patient's Chart, lab work & pertinent test results, reviewed documented beta blocker date and time   Airway Mallampati: II  TM Distance: >3 FB Neck ROM: Full    Dental no notable dental hx.    Pulmonary shortness of breath,  breath sounds clear to auscultation  Pulmonary exam normal       Cardiovascular hypertension, Pt. on medications and Pt. on home beta blockers +CHF Normal cardiovascular exam+ Valvular Problems/Murmurs AS and MR Rhythm:Regular Rate:Normal     Neuro/Psych negative neurological ROS  negative psych ROS   GI/Hepatic negative GI ROS, Neg liver ROS,   Endo/Other  Hypothyroidism Hyperthyroidism   Renal/GU negative Renal ROS     Musculoskeletal negative musculoskeletal ROS (+)   Abdominal (+) + obese,   Peds  Hematology negative hematology ROS (+)   Anesthesia Other Findings   Reproductive/Obstetrics negative OB ROS                            Anesthesia Physical Anesthesia Plan  ASA: IV  Anesthesia Plan: General   Post-op Pain Management:    Induction: Intravenous  Airway Management Planned: Nasal ETT and Oral ETT  Additional Equipment:   Intra-op Plan:   Post-operative Plan: Extubation in OR  Informed Consent: I have reviewed the patients History and Physical, chart, labs and discussed the procedure including the risks, benefits and alternatives for the proposed anesthesia with the patient or authorized representative who has indicated his/her understanding and acceptance.   Dental advisory given  Plan Discussed with: CRNA  Anesthesia Plan Comments:         Anesthesia Quick Evaluation

## 2014-08-05 ENCOUNTER — Ambulatory Visit (HOSPITAL_COMMUNITY): Payer: 59 | Admitting: Anesthesiology

## 2014-08-05 ENCOUNTER — Encounter (HOSPITAL_COMMUNITY)
Admission: RE | Disposition: A | Discharge status: Home / Self Care | Payer: Self-pay | Source: Ambulatory Visit | Attending: Interventional Cardiology

## 2014-08-05 ENCOUNTER — Encounter (HOSPITAL_COMMUNITY): Payer: Self-pay | Admitting: *Deleted

## 2014-08-05 ENCOUNTER — Observation Stay (HOSPITAL_COMMUNITY)
Admission: RE | Admit: 2014-08-05 | Discharge: 2014-08-06 | Disposition: A | Discharge status: Home / Self Care | Payer: 59 | Source: Ambulatory Visit | Attending: Interventional Cardiology | Admitting: Interventional Cardiology

## 2014-08-05 ENCOUNTER — Ambulatory Visit (HOSPITAL_COMMUNITY): Payer: 59 | Admitting: Emergency Medicine

## 2014-08-05 DIAGNOSIS — K08409 Partial loss of teeth, unspecified cause, unspecified class: Secondary | ICD-10-CM | POA: Diagnosis not present

## 2014-08-05 DIAGNOSIS — Z7982 Long term (current) use of aspirin: Secondary | ICD-10-CM | POA: Diagnosis not present

## 2014-08-05 DIAGNOSIS — I10 Essential (primary) hypertension: Secondary | ICD-10-CM | POA: Diagnosis present

## 2014-08-05 DIAGNOSIS — E039 Hypothyroidism, unspecified: Secondary | ICD-10-CM | POA: Insufficient documentation

## 2014-08-05 DIAGNOSIS — I350 Nonrheumatic aortic (valve) stenosis: Secondary | ICD-10-CM | POA: Diagnosis not present

## 2014-08-05 DIAGNOSIS — I482 Chronic atrial fibrillation: Secondary | ICD-10-CM | POA: Diagnosis not present

## 2014-08-05 DIAGNOSIS — Z7901 Long term (current) use of anticoagulants: Secondary | ICD-10-CM | POA: Insufficient documentation

## 2014-08-05 DIAGNOSIS — Z880 Allergy status to penicillin: Secondary | ICD-10-CM | POA: Insufficient documentation

## 2014-08-05 DIAGNOSIS — I5032 Chronic diastolic (congestive) heart failure: Secondary | ICD-10-CM | POA: Diagnosis not present

## 2014-08-05 DIAGNOSIS — D696 Thrombocytopenia, unspecified: Secondary | ICD-10-CM | POA: Diagnosis not present

## 2014-08-05 DIAGNOSIS — I4819 Other persistent atrial fibrillation: Secondary | ICD-10-CM | POA: Diagnosis present

## 2014-08-05 DIAGNOSIS — Z954 Presence of other heart-valve replacement: Secondary | ICD-10-CM

## 2014-08-05 DIAGNOSIS — K083 Retained dental root: Secondary | ICD-10-CM | POA: Insufficient documentation

## 2014-08-05 DIAGNOSIS — K045 Chronic apical periodontitis: Secondary | ICD-10-CM | POA: Diagnosis not present

## 2014-08-05 DIAGNOSIS — D649 Anemia, unspecified: Secondary | ICD-10-CM | POA: Diagnosis not present

## 2014-08-05 DIAGNOSIS — Z952 Presence of prosthetic heart valve: Secondary | ICD-10-CM

## 2014-08-05 DIAGNOSIS — K029 Dental caries, unspecified: Secondary | ICD-10-CM | POA: Insufficient documentation

## 2014-08-05 DIAGNOSIS — K0530 Chronic periodontitis, unspecified: Secondary | ICD-10-CM | POA: Insufficient documentation

## 2014-08-05 DIAGNOSIS — I5042 Chronic combined systolic (congestive) and diastolic (congestive) heart failure: Secondary | ICD-10-CM | POA: Diagnosis present

## 2014-08-05 DIAGNOSIS — E669 Obesity, unspecified: Secondary | ICD-10-CM | POA: Diagnosis present

## 2014-08-05 HISTORY — PX: MULTIPLE EXTRACTIONS WITH ALVEOLOPLASTY: SHX5342

## 2014-08-05 LAB — CBC
HCT: 28.9 % — ABNORMAL LOW (ref 36.0–46.0)
Hemoglobin: 9.4 g/dL — ABNORMAL LOW (ref 12.0–15.0)
MCH: 27.2 pg (ref 26.0–34.0)
MCHC: 32.5 g/dL (ref 30.0–36.0)
MCV: 83.5 fL (ref 78.0–100.0)
PLATELETS: 75 10*3/uL — AB (ref 150–400)
RBC: 3.46 MIL/uL — ABNORMAL LOW (ref 3.87–5.11)
RDW: 17.4 % — AB (ref 11.5–15.5)
WBC: 4 10*3/uL (ref 4.0–10.5)

## 2014-08-05 LAB — GLUCOSE, CAPILLARY: Glucose-Capillary: 174 mg/dL — ABNORMAL HIGH (ref 70–99)

## 2014-08-05 LAB — PROTIME-INR
INR: 1.39 (ref 0.00–1.49)
INR: 1.31 (ref 0.00–1.49)
PROTHROMBIN TIME: 17.2 s — AB (ref 11.6–15.2)
Prothrombin Time: 16.5 seconds — ABNORMAL HIGH (ref 11.6–15.2)

## 2014-08-05 LAB — APTT: aPTT: 36 seconds (ref 24–37)

## 2014-08-05 SURGERY — MULTIPLE EXTRACTION WITH ALVEOLOPLASTY
Anesthesia: General

## 2014-08-05 MED ORDER — SODIUM CHLORIDE 0.9 % IV SOLN: 250.0000 mL | INTRAVENOUS | Status: DC | PRN

## 2014-08-05 MED ORDER — KETAMINE HCL 10 MG/ML IJ SOLN
INTRAMUSCULAR | Status: DC | PRN
  Administered 2014-08-05 (×2): 30 mg via INTRAVENOUS

## 2014-08-05 MED ORDER — CLINDAMYCIN PHOSPHATE 600 MG/50ML IV SOLN
INTRAVENOUS | Status: AC
  Administered 2014-08-05: 600 mg via INTRAVENOUS
  Filled 2014-08-05: qty 50

## 2014-08-05 MED ORDER — NEOSTIGMINE METHYLSULFATE 10 MG/10ML IV SOLN
INTRAVENOUS | Status: AC
  Filled 2014-08-05: qty 1

## 2014-08-05 MED ORDER — ONDANSETRON HCL 4 MG/2ML IJ SOLN: 4.0000 mg | Freq: Four times a day (QID) | INTRAMUSCULAR | Status: DC | PRN

## 2014-08-05 MED ORDER — PHENYLEPHRINE HCL 10 MG/ML IJ SOLN
INTRAMUSCULAR | Status: AC
  Filled 2014-08-05: qty 1

## 2014-08-05 MED ORDER — ARTIFICIAL TEARS OP OINT
TOPICAL_OINTMENT | OPHTHALMIC | Status: AC
  Filled 2014-08-05: qty 3.5

## 2014-08-05 MED ORDER — LEVOTHYROXINE SODIUM 150 MCG PO TABS
150.0000 ug | ORAL_TABLET | Freq: Every day | ORAL | Status: DC
  Administered 2014-08-06: 150 ug via ORAL
  Filled 2014-08-05 (×3): qty 1

## 2014-08-05 MED ORDER — AMINOCAPROIC ACID SOLUTION 5% (50 MG/ML)
10.0000 mL | ORAL | Status: AC
  Administered 2014-08-05 (×4): 10 mL via ORAL
  Filled 2014-08-05: qty 100

## 2014-08-05 MED ORDER — ACETAMINOPHEN 325 MG PO TABS: 650.0000 mg | ORAL_TABLET | ORAL | Status: DC | PRN

## 2014-08-05 MED ORDER — ESMOLOL HCL 10 MG/ML IV SOLN
INTRAVENOUS | Status: DC | PRN
  Administered 2014-08-05: 30 mg via INTRAVENOUS

## 2014-08-05 MED ORDER — LIDOCAINE HCL (CARDIAC) 20 MG/ML IV SOLN
INTRAVENOUS | Status: DC | PRN
  Administered 2014-08-05: 100 mg via INTRAVENOUS

## 2014-08-05 MED ORDER — LIDOCAINE-EPINEPHRINE 2 %-1:100000 IJ SOLN
INTRAMUSCULAR | Status: AC
  Filled 2014-08-05: qty 10.2

## 2014-08-05 MED ORDER — PHENYLEPHRINE HCL 10 MG/ML IJ SOLN
INTRAMUSCULAR | Status: DC | PRN
  Administered 2014-08-05: 120 ug via INTRAVENOUS
  Administered 2014-08-05 (×3): 80 ug via INTRAVENOUS
  Administered 2014-08-05: 40 ug via INTRAVENOUS

## 2014-08-05 MED ORDER — PROPOFOL 10 MG/ML IV BOLUS
INTRAVENOUS | Status: AC
  Filled 2014-08-05: qty 20

## 2014-08-05 MED ORDER — ONDANSETRON HCL 4 MG/2ML IJ SOLN
INTRAMUSCULAR | Status: DC | PRN
  Administered 2014-08-05: 4 mg via INTRAVENOUS

## 2014-08-05 MED ORDER — OXYMETAZOLINE HCL 0.05 % NA SOLN
NASAL | Status: AC
  Filled 2014-08-05: qty 15

## 2014-08-05 MED ORDER — FUROSEMIDE 40 MG PO TABS
40.0000 mg | ORAL_TABLET | Freq: Two times a day (BID) | ORAL | Status: DC
  Administered 2014-08-05 – 2014-08-06 (×2): 40 mg via ORAL
  Filled 2014-08-05 (×5): qty 1

## 2014-08-05 MED ORDER — OXYCODONE-ACETAMINOPHEN 5-325 MG PO TABS
1.0000 | ORAL_TABLET | Freq: Four times a day (QID) | ORAL | Status: DC | PRN
  Administered 2014-08-05 – 2014-08-06 (×3): 2 via ORAL
  Filled 2014-08-05 (×3): qty 2

## 2014-08-05 MED ORDER — LIDOCAINE-EPINEPHRINE 2 %-1:100000 IJ SOLN
INTRAMUSCULAR | Status: DC | PRN
  Administered 2014-08-05 (×6): 1.7 mL via INTRADERMAL

## 2014-08-05 MED ORDER — BUPIVACAINE-EPINEPHRINE 0.5% -1:200000 IJ SOLN
INTRAMUSCULAR | Status: DC | PRN
  Administered 2014-08-05 (×2): 1.8 mL

## 2014-08-05 MED ORDER — FENTANYL CITRATE (PF) 250 MCG/5ML IJ SOLN
INTRAMUSCULAR | Status: AC
  Filled 2014-08-05: qty 5

## 2014-08-05 MED ORDER — PHENYLEPHRINE HCL 10 MG/ML IJ SOLN
10.0000 mg | INTRAVENOUS | Status: DC | PRN
  Administered 2014-08-05: 50 ug/min via INTRAVENOUS

## 2014-08-05 MED ORDER — POTASSIUM CHLORIDE ER 10 MEQ PO TBCR
10.0000 meq | EXTENDED_RELEASE_TABLET | Freq: Every day | ORAL | Status: DC
  Administered 2014-08-06: 10 meq via ORAL
  Filled 2014-08-05: qty 1

## 2014-08-05 MED ORDER — METOPROLOL SUCCINATE ER 50 MG PO TB24
50.0000 mg | ORAL_TABLET | Freq: Every day | ORAL | Status: DC
  Administered 2014-08-06: 50 mg via ORAL
  Filled 2014-08-05 (×3): qty 1

## 2014-08-05 MED ORDER — SODIUM CHLORIDE 0.9 % IJ SOLN
INTRAMUSCULAR | Status: AC
  Filled 2014-08-05: qty 20

## 2014-08-05 MED ORDER — LACTATED RINGERS IV SOLN
INTRAVENOUS | Status: DC
  Administered 2014-08-05: 15:00:00 via INTRAVENOUS

## 2014-08-05 MED ORDER — KETAMINE HCL 100 MG/ML IJ SOLN
INTRAMUSCULAR | Status: AC
  Filled 2014-08-05: qty 1

## 2014-08-05 MED ORDER — SODIUM CHLORIDE 0.9 % IV SOLN
Freq: Once | INTRAVENOUS | Status: AC
  Administered 2014-08-05 (×2): via INTRAVENOUS

## 2014-08-05 MED ORDER — AMLODIPINE BESYLATE 2.5 MG PO TABS
2.5000 mg | ORAL_TABLET | Freq: Every day | ORAL | Status: DC
  Administered 2014-08-06: 2.5 mg via ORAL
  Filled 2014-08-05: qty 1

## 2014-08-05 MED ORDER — ROCURONIUM BROMIDE 50 MG/5ML IV SOLN
INTRAVENOUS | Status: AC
  Filled 2014-08-05: qty 1

## 2014-08-05 MED ORDER — GLYCOPYRROLATE 0.2 MG/ML IJ SOLN
INTRAMUSCULAR | Status: AC
  Filled 2014-08-05: qty 3

## 2014-08-05 MED ORDER — SODIUM CHLORIDE 0.9 % IJ SOLN
3.0000 mL | Freq: Two times a day (BID) | INTRAMUSCULAR | Status: DC
Start: 2014-08-05 — End: 2014-08-06

## 2014-08-05 MED ORDER — FENTANYL CITRATE (PF) 100 MCG/2ML IJ SOLN
INTRAMUSCULAR | Status: DC | PRN
  Administered 2014-08-05: 150 ug via INTRAVENOUS

## 2014-08-05 MED ORDER — OXYMETAZOLINE HCL 0.05 % NA SOLN
NASAL | Status: DC | PRN
  Administered 2014-08-05: 1 via NASAL

## 2014-08-05 MED ORDER — CYCLOBENZAPRINE HCL 5 MG PO TABS
5.0000 mg | ORAL_TABLET | Freq: Every day | ORAL | Status: DC
  Administered 2014-08-06: 5 mg via ORAL
  Filled 2014-08-05 (×3): qty 1

## 2014-08-05 MED ORDER — HYDROMORPHONE HCL 1 MG/ML IJ SOLN: 0.2500 mg | INTRAMUSCULAR | Status: DC | PRN

## 2014-08-05 MED ORDER — MIDAZOLAM HCL 5 MG/5ML IJ SOLN
INTRAMUSCULAR | Status: DC | PRN
  Administered 2014-08-05: 2 mg via INTRAVENOUS

## 2014-08-05 MED ORDER — PROMETHAZINE HCL 25 MG/ML IJ SOLN: 6.2500 mg | INTRAMUSCULAR | Status: DC | PRN

## 2014-08-05 MED ORDER — SUCCINYLCHOLINE CHLORIDE 20 MG/ML IJ SOLN
INTRAMUSCULAR | Status: AC
  Filled 2014-08-05: qty 1

## 2014-08-05 MED ORDER — BUPIVACAINE-EPINEPHRINE (PF) 0.5% -1:200000 IJ SOLN
INTRAMUSCULAR | Status: AC
  Filled 2014-08-05: qty 3.6

## 2014-08-05 MED ORDER — GLYCOPYRROLATE 0.2 MG/ML IJ SOLN
INTRAMUSCULAR | Status: DC | PRN
Start: 2014-08-05 — End: 2014-08-05
  Administered 2014-08-05: 0.2 mg via INTRAVENOUS
  Administered 2014-08-05: 0.4 mg via INTRAVENOUS

## 2014-08-05 MED ORDER — MEPERIDINE HCL 25 MG/ML IJ SOLN: 6.2500 mg | INTRAMUSCULAR | Status: DC | PRN

## 2014-08-05 MED ORDER — 0.9 % SODIUM CHLORIDE (POUR BTL) OPTIME
TOPICAL | Status: DC | PRN
  Administered 2014-08-05: 1000 mL

## 2014-08-05 MED ORDER — ONDANSETRON HCL 4 MG/2ML IJ SOLN
INTRAMUSCULAR | Status: AC
  Filled 2014-08-05: qty 2

## 2014-08-05 MED ORDER — LACTATED RINGERS IV SOLN
INTRAVENOUS | Status: DC | PRN
  Administered 2014-08-05 (×2): via INTRAVENOUS

## 2014-08-05 MED ORDER — PHENYLEPHRINE 40 MCG/ML (10ML) SYRINGE FOR IV PUSH (FOR BLOOD PRESSURE SUPPORT)
PREFILLED_SYRINGE | INTRAVENOUS | Status: AC
  Filled 2014-08-05: qty 10

## 2014-08-05 MED ORDER — MIDAZOLAM HCL 2 MG/2ML IJ SOLN
INTRAMUSCULAR | Status: AC
  Filled 2014-08-05: qty 2

## 2014-08-05 MED ORDER — ASPIRIN EC 81 MG PO TBEC
81.0000 mg | DELAYED_RELEASE_TABLET | Freq: Every day | ORAL | Status: DC
  Filled 2014-08-05: qty 1

## 2014-08-05 MED ORDER — SODIUM CHLORIDE 0.9 % IJ SOLN: 3.0000 mL | INTRAMUSCULAR | Status: DC | PRN

## 2014-08-05 MED ORDER — LIDOCAINE HCL (CARDIAC) 20 MG/ML IV SOLN
INTRAVENOUS | Status: AC
  Filled 2014-08-05: qty 15

## 2014-08-05 MED ORDER — ROCURONIUM BROMIDE 100 MG/10ML IV SOLN
INTRAVENOUS | Status: DC | PRN
Start: 2014-08-05 — End: 2014-08-05
  Administered 2014-08-05: 20 mg via INTRAVENOUS

## 2014-08-05 MED ORDER — NEOSTIGMINE METHYLSULFATE 10 MG/10ML IV SOLN
INTRAVENOUS | Status: DC | PRN
  Administered 2014-08-05: 2.5 mg via INTRAVENOUS

## 2014-08-05 MED ORDER — ARTIFICIAL TEARS OP OINT
TOPICAL_OINTMENT | OPHTHALMIC | Status: DC | PRN
  Administered 2014-08-05: 1 via OPHTHALMIC

## 2014-08-05 MED ORDER — SODIUM CHLORIDE 0.9 % IJ SOLN
INTRAMUSCULAR | Status: AC
  Filled 2014-08-05: qty 10

## 2014-08-05 MED ORDER — HEMOSTATIC AGENTS (NO CHARGE) OPTIME
TOPICAL | Status: DC | PRN
  Administered 2014-08-05: 1 via TOPICAL

## 2014-08-05 MED ORDER — EPHEDRINE SULFATE 50 MG/ML IJ SOLN
INTRAMUSCULAR | Status: DC | PRN
  Administered 2014-08-05: 10 mg via INTRAVENOUS

## 2014-08-05 SURGICAL SUPPLY — 36 items
ALCOHOL 70% 16 OZ (MISCELLANEOUS) ×3 IMPLANT
ATTRACTOMAT 16X20 MAGNETIC DRP (DRAPES) ×3 IMPLANT
BLADE SURG 15 STRL LF DISP TIS (BLADE) ×2 IMPLANT
BLADE SURG 15 STRL SS (BLADE) ×6
COVER SURGICAL LIGHT HANDLE (MISCELLANEOUS) ×3 IMPLANT
GAUZE PACKING FOLDED 2  STR (GAUZE/BANDAGES/DRESSINGS) ×2
GAUZE PACKING FOLDED 2 STR (GAUZE/BANDAGES/DRESSINGS) ×1 IMPLANT
GAUZE SPONGE 4X4 16PLY XRAY LF (GAUZE/BANDAGES/DRESSINGS) ×9 IMPLANT
GLOVE BIO SURGEON STRL SZ 6 (GLOVE) ×3 IMPLANT
GLOVE BIO SURGEON STRL SZ 6.5 (GLOVE) ×2 IMPLANT
GLOVE BIO SURGEONS STRL SZ 6.5 (GLOVE) ×1
GLOVE BIOGEL PI IND STRL 6 (GLOVE) ×1 IMPLANT
GLOVE BIOGEL PI INDICATOR 6 (GLOVE) ×2
GLOVE SURG ORTHO 8.0 STRL STRW (GLOVE) ×3 IMPLANT
GLOVE SURG SS PI 6.0 STRL IVOR (GLOVE) ×6 IMPLANT
GOWN STRL REUS W/ TWL LRG LVL3 (GOWN DISPOSABLE) ×1 IMPLANT
GOWN STRL REUS W/TWL 2XL LVL3 (GOWN DISPOSABLE) ×3 IMPLANT
GOWN STRL REUS W/TWL LRG LVL3 (GOWN DISPOSABLE) ×3
HEMOSTAT SURGICEL 2X14 (HEMOSTASIS) ×3 IMPLANT
KIT BASIN OR (CUSTOM PROCEDURE TRAY) ×3 IMPLANT
KIT ROOM TURNOVER OR (KITS) ×3 IMPLANT
MANIFOLD NEPTUNE WASTE (CANNULA) ×3 IMPLANT
NEEDLE BLUNT 16X1.5 OR ONLY (NEEDLE) ×3 IMPLANT
NS IRRIG 1000ML POUR BTL (IV SOLUTION) ×3 IMPLANT
PACK EENT II TURBAN DRAPE (CUSTOM PROCEDURE TRAY) ×3 IMPLANT
PAD ARMBOARD 7.5X6 YLW CONV (MISCELLANEOUS) ×3 IMPLANT
SPONGE SURGIFOAM ABS GEL 100 (HEMOSTASIS) ×3 IMPLANT
SPONGE SURGIFOAM ABS GEL 12-7 (HEMOSTASIS) IMPLANT
SPONGE SURGIFOAM ABS GEL SZ50 (HEMOSTASIS) IMPLANT
SUCTION FRAZIER TIP 10 FR DISP (SUCTIONS) ×3 IMPLANT
SUT CHROMIC 3 0 PS 2 (SUTURE) ×6 IMPLANT
SYR 50ML SLIP (SYRINGE) ×3 IMPLANT
TOWEL OR 17X26 10 PK STRL BLUE (TOWEL DISPOSABLE) ×3 IMPLANT
TUBE CONNECTING 12'X1/4 (SUCTIONS) ×1
TUBE CONNECTING 12X1/4 (SUCTIONS) ×2 IMPLANT
YANKAUER SUCT BULB TIP NO VENT (SUCTIONS) ×3 IMPLANT

## 2014-08-05 NOTE — H&P (Signed)
08/05/2014  Patient:            Jamie Howell Date of Birth:  [birth year] MRN:                [id number]   BP 153/63 mmHg  Pulse 86  Temp(Src) 97.8 F (36.6 C) (Oral)  Resp 16  Wt 188 lb (85.276 kg)  SpO2 99%   FAYRENE TOWNER is a 64 year old female with severe aortic stenosis, status post mitral valve replacement, and chronic anticoagulation. Patient with anticipated aortic valve replacement with Dr. Cornelius Moras. Patient now presents for extraction remaining teeth with alveoloplasty and pre-prosthetic surgery as indicated with general anesthesia. Patient has been bridged with Lovenox therapy. Patient with anticipated administration of 2 units of apharesed platelets at start up dental procedure. Patient will then be kept overnight for observation by the cardiology team.  The patient currently denies acute medical or dental changes. Recent lab values were evaluated and discussed with the patient. Patient agrees to proceed with procedures as planned. Please see note from Dr. Cornelius Moras on 07/26/2014 to act as H&P for dental operating room procedure.  Charlynne Pander, DDS  Progress Notes    Expand All Collapse All    [facility]  MULTIDISCIPLINARY [facility]ART VALVE CLINIC    CARDIOTHORACIC SURGERY NOTE  Referring Provider is Tonny Bollman, MD PCP is Londell Moh, MD   HPI:  Patient returns to the office today for follow-up of severe symptomatic aortic stenosis with underlying long-standing rheumatic heart disease and chronic diastolic congestive heart failure. She was originally seen in consultation on 07/08/2014. Since then she underwent CT angiography to further characterize the potential anatomical feasibility of transcatheter aortic valve replacement as an alternative to high-risk conventional surgery. The patient has additionally been seen in consultation by Dr. Robin Searing who plans dental extraction in the operating room next week.  The patient reports no new problems or complaints over the past 2 weeks. She describes stable symptoms of exertional shortness of breath and fatigue with very low level exertion, consistent with chronic diastolic congestive heart failure New York Heart Association functional class III.   Current Outpatient Prescriptions  Medication Sig Dispense Refill  . amLODipine (NORVASC) 2.5 MG tablet Take 1 tablet (2.5 mg total) by mouth daily. 30 tablet 5  . aspirin EC 81 MG tablet Take 1 tablet (81 mg total) by mouth daily. (Patient taking differently: Take 81 mg by mouth daily with lunch. )    . Calcium Carb-Cholecalciferol (CALCIUM 1000 + D PO) Take 1,000 mg by mouth daily with lunch.     . cyclobenzaprine (FLEXERIL) 10 MG tablet Take 5 mg by mouth at bedtime.     . furosemide (LASIX) 40 MG tablet Take 1 tablet (40 mg total) by mouth 2 (two) times daily.    Marland Kitchen levothyroxine (SYNTHROID, LEVOTHROID) 150 MCG tablet Take 150 mcg by mouth daily before breakfast.    . metoprolol succinate (TOPROL-XL) 50 MG 24 hr tablet Take 1 tablet (50 mg total) by mouth daily. Take with or immediately following a meal. (Patient taking differently: Take 50 mg by mouth at bedtime. Take with or immediately following a meal.) 30 tablet 5  . potassium chloride (K-DUR) 10 MEQ tablet Take 10 mEq by mouth daily with lunch.     . warfarin (COUMADIN) 2.5 MG tablet Take 2.5-5 mg by mouth daily. Take 2 tablets (5 mg) on Wednesday and Friday, take 1 tablet (2.5 mg) on Sunday, Monday, Tuesday, Thursday, Saturday  No current facility-administered medications for this visit.     Physical Exam:  BP 133/69 mmHg  Pulse 87  Resp 16  Ht 4\' 11"  (1.499 m)  Wt 183 lb (83.008 kg)  BMI 36.94 kg/m2  SpO2 97% General:Obese but well appearing Chest:Clear to  auscultation ZO:XWRUEAVCV:Regular rate and rhythm with mechanical heart valve sounds and grade 3/6 systolic murmur Incisions:n/a Abdomen:Soft and nontender Extremities:Warm and well-perfused  Diagnostic Tests:  Cardiac TAVR CT  TECHNIQUE: The patient was scanned on a Philips 256 scanner. A 120 kV retrospective scan was triggered in the descending thoracic aorta at 111 HU's. Gantry rotation speed was 270 msecs and collimation was .9 mm. No beta blockade or nitro were given. The 3D data set was reconstructed in 5% intervals of the R-R cycle. Systolic and diastolic phases were analyzed on a dedicated work station using MPR, MIP and VRT modes. The patient received 80 cc of contrast.  FINDINGS: Aortic Valve: Trileaflet non coronary cusp less calcified. Annulus is more spherical than usual with no calcium.  There is a normal functioning bileaflet mechanical Mitral valve  There is severe biatrial enlargement  Sinotubular Junction: 2.9 cm  Ascending Thoracic Aorta: 3.4 cm  Descending Thoracic Aorta: 1.9 cm  Sinus of Valsalva Measurements:  Non-coronary: 34 mm  Right -coronary: 32 mm  Left -coronary: 30 mm  Coronary Artery Height above Annulus:  Left Main: 15.8 mm  Right Coronary: 14.8 mm  Virtual Basal Annulus Measurements:  Maximum/Minimum Diameter: 26.7 mm x 23 mm more spherical than usual  Area: 534 mm2  Optimum Fluoroscopic Angle for Delivery: RAO 6 degrees Cranial 0 degrees  IMPRESSION: 1) Calcified trileaflet aortic valve. Spherical non calcified annulus measures 534 mm2 suitable for underinflated 29 mm Sapien 3  2) LVOT not narrow measures 2.5 cm with no anterior mitral leaflet due to MVR  3) Normal appearing bileaflet mechanical MVR  4) Severe biatrial enlargement  5) Suitable coronary height  for delivery of valve  6) Optimum Angiographic angle for delivery RAO 6 degrees Cranial 0 degrees  Charlton HawsPeter Nishan   Electronically Signed  By: Charlton HawsPeter Nishan M.D.  On: 07/14/2014 17:55      Study Result     EXAM: OVER-READ INTERPRETATION CT CHEST  The following report is an over-read performed by radiologist Dr. Irma NewnessWilliam Veazeyof [facility] on 07/14/2014. This over-read does not include interpretation of cardiac or coronary anatomy or pathology. The coronary CTA interpretation by the cardiologist is attached.  COMPARISON: Portable chest 07/01/2003. This study was performed in conjunction with a routine CTA of the chest, abdomen and pelvis.  FINDINGS: Small mediastinal and hilar lymph nodes are better seen on accompanying chest CTA. There is a small hiatal hernia.  Cardiovascular findings are reported separately.  The lungs demonstrate patchy ground-glass opacities with mosaic attenuation. No endobronchial lesion or confluent airspace opacity identified.  The visualized upper abdomen is notable for contour irregularity of the liver suspicious for cirrhosis. The spleen appears enlarged.  IMPRESSION: 1. Mosaic attenuation in the lungs, likely related to small airways disease. 2. Nonspecific small mediastinal lymph nodes, not pathologically enlarged. 3. Probable hepatic cirrhosis and portal hypertension manifesting as splenomegaly. 4. Cardiovascular findings are dictated separately. In addition, chest CTA findings dictated separately.  Electronically Signed: By: Carey BullocksWilliam Veazey M.D. On: 07/14/2014 12:14      CT ANGIOGRAPHY CHEST, ABDOMEN AND PELVIS  TECHNIQUE: Multidetector CT imaging through the chest, abdomen and pelvis was performed using the standard protocol during bolus administration of intravenous contrast. Multiplanar  reconstructed images and MIPs were obtained and reviewed to evaluate the vascular  anatomy.  CONTRAST: 70mL OMNIPAQUE IOHEXOL 350 MG/ML SOLN  COMPARISON: None.  FINDINGS: CTA CHEST FINDINGS  Mediastinum/Lymph Nodes: There multiple borderline enlarged and mildly enlarged mediastinal lymph nodes, largest of which are in the prevascular nodal station measuring 11 mm in short axis, and in the high right paratracheal nodal station measuring up to 13 mm in short axis. Cardiomegaly with left atrial dilatation. Mechanical mitral valve. Extensive calcifications of the mitral annulus and subvalvular apparatus. Extensive calcifications of the aortic valve. There is no significant pericardial fluid, thickening or pericardial calcification. Esophagus is unremarkable in appearance. Multiple large paraesophageal varices, particularly adjacent to the gastroesophageal junction. No axillary lymphadenopathy.  Lungs/Pleura: Mosaic attenuation throughout the lung parenchyma suggestive a significant air trapping. No consolidative airspace disease. No pleural effusions. No suspicious appearing pulmonary nodules or masses.  Musculoskeletal/Soft Tissues: Median sternotomy wires. There are no aggressive appearing lytic or blastic lesions noted in the visualized portions of the skeleton.  CTA ABDOMEN AND PELVIS FINDINGS  Hepatobiliary: The liver has a shrunken appearance and nodular contour, compatible with underlying cirrhosis. No definite suspicious cystic or solid hepatic lesion identified. Status post cholecystectomy. No intra or extrahepatic biliary ductal dilatation. Portal vein is mildly dilated measuring up to 15 mm in diameter.  Pancreas: Unremarkable.  Spleen: Spleen is enlarged measuring 15.3 x 7.9 x 18.5 cm (estimated splenic volume of 1,118 mL).  Adrenals/Urinary Tract: Bilateral adrenal glands are normal in appearance. Mild multifocal cortical thinning in the kidneys bilaterally, presumably chronic post infectious or  inflammatory scarring.  Stomach/Bowel: The appearance of the stomach is normal. No pathologic dilatation of small bowel or colon. Numerous colonic diverticulae are present, particularly in the sigmoid colon, without surrounding inflammatory changes to suggest an acute diverticulitis at this time. Normal appendix.  Vascular/Lymphatic: Vascular findings and measurements pertinent to potential TAVR procedure, as below. Multiple dilated veins in the upper thighs bilaterally (left greater than right), with the largest varix on the right side immediately anterior to the distal common femoral artery measuring up to 4.0 x 4.4 cm. Multiple borderline enlarged retroperitoneal and mesenteric lymph nodes.  Reproductive: Status post hysterectomy. Ovaries are unremarkable in appearance.  Other: Trace volume of ascites. Small epigastric ventral hernia containing some ascites and omental fat.  Musculoskeletal: There are no aggressive appearing lytic or blastic lesions noted in the visualized portions of the skeleton.  VASCULAR MEASUREMENTS PERTINENT TO TAVR:  AORTA:  Minimal Aortic Diameter - 13 x 13 mm  Severity of Aortic Calcification - mild  RIGHT PELVIS:  Right Common Iliac Artery -  Minimal Diameter - 9.2 x 8.9 mm  Tortuosity - mild  Calcification - mild  Right External Iliac Artery -  Minimal Diameter - 7.5 x 7.2 mm  Tortuosity - mild  Calcification - none  Right Common Femoral Artery -  Minimal Diameter - 6.2 x 6.7 mm  Tortuosity - mild  Calcification - none  LEFT PELVIS:  Left Common Iliac Artery -  Minimal Diameter - 8.3 x 8.1 mm  Tortuosity - mild  Calcification - mild  Left External Iliac Artery -  Minimal Diameter - 6.5 x 6.7 mm  Tortuosity - mild  Calcification - none  Left Common Femoral Artery -  Minimal Diameter - 6.3 x 6.5 mm  Tortuosity - mild  Calcification - none  Review of the MIP images  confirms the above findings.  IMPRESSION: 1. Vascular findings and measurements pertinent to potential TAVR  procedure, as detailed above. This patient does appear to have suitable pelvic arterial access. 2. However, this patient has multiple large venous varices, many of which overlie deep pelvic access vessels. Most notably, the largest venous varix is immediately superficial to the right common femoral vein. 3. Stigmata of cirrhosis with portal hypertension, including large esophageal varices, and splenomegaly. 4. Colonic diverticulosis without findings to suggest acute diverticulitis at this time. 5. Additional incidental findings, as above.   Electronically Signed  By: Trudie Reedaniel Entrikin M.D.  On: 07/19/2014 14:30   Impression:  Patient has stage D severe symptomatic aortic stenosis. She presents with a several month history of worsening symptoms of exertional shortness of breath and fatigue consistent with chronic diastolic congestive heart failure, currently New York Heart Association functional class 3. 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 I have personally reviewed the patient's recent transthoracic echocardiogram and diagnostic cardiac catheterization. The aortic valve appears tricuspid with severe thickening and restricted leaflet mobility involving all 3 leaflets. There is mild aortic insufficiency. Peak velocity across the aortic valve measured 3.9 m/s corresponding to estimated mean transvalvular gradient of approximately 40 mmHg. The diameter of the left ventricular outflow tract was measured 1.9 cm. The mitral valve prosthesis appears to be functioning normally. The patient additionally has moderate pulmonary hypertension and moderate tricuspid regurgitation. Cardiac gated CT angiogram of the heart demonstrates anatomical characteristics that appear  relatively favorable for transcatheter aortic valve replacement. There is not much annular calcification, there appears to be adequate coronary height and sinotubular dimensions, and the overall size of the aortic annulus appears more than adequate for at least a 26 mm transcatheter valve. CT angiogram of the aorta and iliac vessels demonstrates adequate pelvic arterial access for a transfemoral approach. Of note, CT angiogram also demonstrates the presence of significant portal hypertension with esophageal varices and splenomegaly. Risks associated with conventional surgical aortic valve replacement through a redo median sternotomy would clearly be relatively high. Under the circumstances I feel that transcatheter aortic valve replacement would be a far less risky treatment alternative, although the patient's relatively young age raises questions about the long-term durability of currently available transcatheter heart valves.    Plan:  I have discussed the results of the patient's recent CT angiograms with the patient and her friend here in the office today. The relative risks and benefits of transcatheter aortic valve replacement were compared and contrasted with redo median sternotomy for conventional surgical aortic valve replacement. The patient will contemplate treatment options further and proceed with dental extraction next week as previously scheduled. Her case will be reviewed by a multidisciplinary team of specialists to discuss options further. The patient will return in 3 weeks for follow-up. In the meanwhile she will undergo formal pulmonary function tests and a physical therapy evaluation.   I spent in excess of 30 minutes during the conduct of this office consultation and >50% of this time involved direct face-to-face encounter with the patient for counseling and/or coordination of their care.    Salvatore Decentlarence H. Cornelius Moraswen, MD 07/26/2014 10:44 AM

## 2014-08-05 NOTE — Progress Notes (Signed)
F/U Hgb stable at 9.4, Plts up to 74K.  Corine ShelterLUKE Merri Dimaano PA-C 08/05/2014 4:43 PM

## 2014-08-05 NOTE — Discharge Instructions (Signed)

## 2014-08-05 NOTE — Op Note (Signed)
OPERATIVE REPORT  Patient:            TAMEAKA EICHHORN Date of Birth:  1950/07/27 MRN:                [id number]   DATE OF PROCEDURE:  08/05/2014  PREOPERATIVE DIAGNOSES: 1. Severe aortic stenosis 2. Preaortic valve replacement dental protocol 3. Chronic anticoagulation 4. Thrombocytopenia 5. Chronic apical periodontitis 6. Multiple retained root segments 7. Dental caries 8. Chronic periodontitis 9. Tooth mobility    POSTOPERATIVE DIAGNOSES: 1. Severe aortic stenosis 2. Preaortic valve replacement dental protocol 3. Chronic anticoagulation 4. Thrombocytopenia 5. Chronic apical periodontitis 6. Multiple retained root segments 7. Dental caries 8. Chronic periodontitis 9. Tooth mobility   OPERATIONS: 1. Multiple extraction of tooth numbers 3, 4, 5, 7, 9, 10, 11, 12, 13, 17, 20, 21, 22, 23, 24, 25, 26, 27, 28, 29, and 32. 2. 4 Quadrants of alveoloplasty   SURGEON: Charlynne Pander, DDS  ASSISTANT: Zettie Pho (dental assistant)  ANESTHESIA: General anesthesia via oral endotracheal tube.  MEDICATIONS: 1. Clindamycin 600 mg IV prior to invasive dental procedures. 2. Local anesthesia with a total utilization of 6 carpules each containing 34 mg of lidocaine with 0.017 mg of epinephrine as well as 2 carpules each containing 9 mg of bupivacaine with 0.009 mg of epinephrine.  SPECIMENS: There are 21 teeth that were discarded.  DRAINS: None  CULTURES: None  COMPLICATIONS: None   ESTIMATED BLOOD LOSS: 300 mLs.  INTRAVENOUS FLUIDS: 1000 mLs of Lactated ringers solution, 300 mLS of normal saline solution, and 3 units of platelets  INDICATIONS: The patient was recently diagnosed with severe aortic stenosis.  A dental consultation was then requested to evaluate poor dentition.  The patient was examined and treatment planned for extraction remaining teeth with alveoloplasty as needed in the operating room with general anesthesia.  This treatment plan was formulated  to decrease the risks and complications associated with dental infection from affecting the patient's systemic health and anticipated aortic valve replacement.  OPERATIVE FINDINGS: Patient was examined operating room number 8.  The teeth were identified for extraction. The patient was noted be affected by chronic periodontitis, tooth mobility, chronic apical periodontitis, multiple dental caries, and multiple retained root segments.   DESCRIPTION OF PROCEDURE: Patient was brought to the main operating room number 8. Patient was then placed in the supine position on the operating table. General anesthesia was then induced per the anesthesia team. The patient was then prepped and draped in the usual manner for dental medicine procedure. A timeout was performed. The patient was identified and procedures were verified. A throat pack was placed at this time. The oral cavity was then thoroughly examined with the findings noted above. The patient was then ready for dental medicine procedure as follows:  Local anesthesia was then administered sequentially with a total utilization of 6 carpules each containing 34 mg of lidocaine with 0.017 mg of epinephrine as well as 2 carpules  each containing 9 mg bupivacaine with 0.009 mg of epinephrine.  The Maxillary left and right quadrants first approached. Anesthesia was then delivered utilizing infiltration with lidocaine with epinephrine. A #15 blade incision was then made from the distal of #2 and extended to the distal of #14.  A  surgical flap was then carefully reflected. The teeth were then subluxated with a series of straight elevators. Tooth numbers 3, 4, 5, 7, 9, 10, 11, 12, 13 were then removed with a 150 forceps or rongeur as indicated without  complications. Alveoloplasty was then performed utilizing a ronguers and bone file. Significant granulation tissue was removed appropriately. The surgical site was then irrigated with copious amounts of sterile saline.  The tissues were approximated and trimmed appropriately. A piece of Surgicel was placed in each extraction socket appropriately. The maxillary right surgical site was then closed from the distal of #2 and extended the mesial #8 utilizing 3-0 chromic gut suture in a continuous interrupted suture technique 1. The maxillary left surgical site was then closed from the distal of #14 extended the mesial #9 utilizing 3-0 chromic gut suture in a continuous interrupted suture technique 1. A soft tissue defect in the area of #11 was then closed utilizing 2 interrupted sutures and 3-0 chromic gut material.  At this point time, the mandibular quadrants were approached. The patient was given bilateral inferior alveolar nerve blocks and long buccal nerve blocks utilizing the bupivacaine with epinephrine. Further infiltration was then achieved utilizing the lidocaine with epinephrine. A 15 blade incision was then made from the distal of number #17 and extended to the distal of #32.  A surgical flap was then carefully reflected. Appropriate amounts of buccal and interseptal bone were then removed utilizing a surgical handpiece and copious amount of sterile water. The lower teeth were then subluxated with a series of straight elevators. Tooth numbers 32, 29, 28, 27, 26, 25, 24, 23, 22, 21, and 20 were then removed utilizing a 151 forceps or rongeur as indicated. Tooth #17 was then approached. The coronal aspect of the tooth 17 was removed with a 17 forceps leaving the roots remaining.  Further buccal and interseptal bone was then removed with a surgical handpiece and bur and copious amounts sterile water. The retained roots were then elevated out with a series of cryers elevators without complication. Alveoloplasty was then performed utilizing a rongeur and bone file. The tissues were approximated and trimmed appropriately. The surgical sites were then irrigated with copious amounts of sterile saline. A piece of Surgicel was  then placed in each extraction socket appropriately. The mandibular left surgical site was then closed from the distal of 17 and extended the mesial #24 utilizing 3-0 chromic gut suture in a continuous interrupted suture technique 1. The mandibular right surgical site was then closed from the distal of #32 and extended the mesial #25 utilizing 3-0 chromic gut suture in a continuous curved suture technique 1. Three individual interrupted sutures then placed to further closed surgical site as needed.  At this point time, the entire mouth was irrigated with copious amounts of sterile saline. The patient was examined for complications, seeing none, the dental medicine procedure was deemed to be complete. The throat pack was removed at this time. An oral airway was then placed at the request of the anesthesia team. A series of 4 x 4 gauze moistened with Amicar 5% rinse were placed in the mouth to aid hemostasis. The patient was then handed over to the anesthesia team for final disposition. After an appropriate amount of time, the patient was extubated and taken to the postanesthsia care unit in good condition. All counts were correct for the dental medicine procedure. Patient will remain in the hospital for overnight observation and will be admitted by the cardiology team. Patient will continue to use the Amicar 5% oral rinse. Patient is rinse with 10 mLs every hour for 10 hours in a swish and spit manner. The patient may use additional Amicar rinse as needed if oozing continues. Cardiology team will evaluate  bleeding loss with a CBC later this evening or early in the morning. Further transfusions will be administered as needed. Warfarin therapy will be restarted tonight or early tomorrow tomorrow if no significant oral bleeding is present. I would not recommend additional Lovenox injections at this time due to the significant oral bleeding noted during the extraction procedures. Patient to be discharged at the  discretion of the cardiology team. Patient will be seen by Dental medicine in approximately 7-10 days for evaluation of healing and suture removal as indicated.   Charlynne Panderonald F. Ohana Birdwell, DDS.

## 2014-08-05 NOTE — Anesthesia Postprocedure Evaluation (Signed)
Anesthesia Post Note  Patient: Danielle Dessrene C Zietz  Procedure(s) Performed: Procedure(s) (LRB): Extraction of tooth #'s 3,4,5,7,8,9,10,11,12,13,17,20,21,22,23,24,25,26,27, 28,29, and 32 with alveoloplasty (N/A)  Anesthesia type: General  Patient location: PACU  Post pain: Pain level controlled  Post assessment: Post-op Vital signs reviewed  Last Vitals: BP 129/61 mmHg  Pulse 92  Temp(Src) 36.9 C (Axillary)  Resp 20  Wt 188 lb (85.276 kg)  SpO2 95%  Post vital signs: Reviewed  Level of consciousness: sedated  Complications: No apparent anesthesia complications

## 2014-08-05 NOTE — H&P (Signed)
Patient ID: [name] MRN: [id number], DOB/AGE: 64-Aug-1952   Admit date: 08/05/2014   Primary Physician: Jamie Moh, MD Primary Cardiologist: Jamie. Mayford Howell Jamie. Excell Howell ( TAVR )  Pt. Profile:  Jamie Howell is a 64 y.o. 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   She is on chronic coumadin and was bridged with lovenox. Patient with anticipated administration of 2 units of apharesed platelets at start up dental procedure. She underwent successful extraction of 21 teeth and 4 quadrants of alveoloplasty with Jamie. Kristin Howell today and we will admit her for observation overnight. Please see note from Jamie. Cornelius Howell on 07/26/2014 for full medical history.   Problem List  Past Medical History  Diagnosis Date  . HTN (hypertension)   . Thyrotoxicosis      without mention of goiter or other cause, without mention of thyrotoxic crisis or storm  . Hypothyroidism   . 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 Atrial fibrillation   . Atrial flutter   . Heart disease   . Hematoma   . Subdural hematoma   . Pulmonary HTN     mild  . OA (ocular albinism)   . Aortic valve stenosis     moderate aortic valve stenosis with moderate aortic insufficiency, stable MVR, trivial PR, mild TR, severe LAE, grade II-III diastolic dysfunction,mild pulmonary HTN , EF 50% by echo 12/2012  . 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 Chronic recurrent paroxysmal atrial fibrillation   . Obesity (BMI 30-39.9) 07/08/2014  . Splenomegaly 07/14/2014  . Portal hypertension with esophageal varices 07/14/2014  . Heart murmur   . Temporary low platelet count     Past Surgical History  Procedure Laterality Date  . Mitral valve replacement  04/08/1991     31mm St Jude mechanical valve - Jamie Jamie Howell  . Left and right heart catheterization with coronary angiogram N/A 06/28/2014    Procedure: LEFT AND RIGHT HEART CATHETERIZATION WITH CORONARY ANGIOGRAM;  Surgeon: Jamie Bollman, MD;  Location: [facility] CATH LAB;  Service: Cardiovascular;  Laterality: N/A;  . Subdural hematoma evacuation via craniotomy  2000    Jamie Howell  . Cholecystectomy  05-30-90  . Total abdominal hysterectomy  07-23-1994  . Tonsillectomy       Allergies  Allergies  Allergen Reactions  . Adhesive [Tape] Itching    Adhesive tape and backing  . Penicillins Rash  . Sulfa Antibiotics Rash     Home Medications  Prior to Admission medications   Medication Sig Start Date End Date Taking? Authorizing Provider  amLODipine (NORVASC) 2.5 MG tablet Take 1 tablet (2.5 mg total) by mouth daily. 02/12/14  Yes Jamie Reichert, MD  aspirin EC 81 MG tablet Take 1 tablet (81 mg total) by mouth daily. Patient taking differently: Take 81 mg by mouth daily with lunch.  12/10/13  Yes Jamie Reichert, MD  Calcium Carb-Cholecalciferol (CALCIUM 1000 + D PO) Take 1,000 mg by mouth daily with lunch.    Yes Historical Provider, MD  cyclobenzaprine (FLEXERIL) 10 MG tablet Take 5 mg by mouth at bedtime.    Yes Historical Provider, MD  enoxaparin (LOVENOX) 40 MG/0.4ML injection Inject 40 mg into the skin daily.  Yes Historical Provider, MD  furosemide (LASIX) 40 MG tablet Take 1 tablet (40 mg total) by mouth 2 (two) times daily. 05/10/14  Yes Jamie Reichertraci R Turner, MD  levothyroxine (SYNTHROID, LEVOTHROID) 150 MCG tablet Take 150 mcg by mouth daily before breakfast.   Yes Historical Provider, MD  metoprolol succinate (TOPROL-XL) 50 MG 24 hr tablet Take 1 tablet (50 mg total) by mouth daily. Take with or immediately following a meal. Patient taking differently: Take 50 mg by mouth at bedtime. Take with or immediately following a meal. 02/12/14  Yes Jamie Reichertraci R Turner, MD  potassium chloride (K-DUR) 10 MEQ tablet Take 10  mEq by mouth daily with lunch.    Yes Historical Provider, MD  warfarin (COUMADIN) 2.5 MG tablet Take 2.5-5 mg by mouth daily. Take 2 tablets (5 mg) on Wednesday and Friday, take 1 tablet (2.5 mg) on Sunday, Monday, Tuesday, Thursday, Saturday   Yes Historical Provider, MD    Family History  Family History  Problem Relation Age of Onset  . CAD Father   . Hypertension Father   . CAD Mother   . Cirrhosis Brother   . Heart attack Sister    Family Status  Relation Status Death Age  . Father Deceased 5866    Heart disease  . Mother Deceased 8356    Heart attack     Social History  History   Social History  . Marital Status: Single    Spouse Name: N/A  . Number of Children: 2  . Years of Education: N/A   Occupational History  . circulation desk clerk    Social History Main Topics  . Smoking status: Never Smoker   . Smokeless tobacco: Never Used  . Alcohol Use: No  . Drug Use: No  . Sexual Activity: Not on file   Other Topics Concern  . Not on file   Social History Narrative      Physical Exam  Blood pressure 122/67, pulse 93, temperature 97.3 F (36.3 C), temperature source Oral, resp. rate 21, weight 188 lb (85.276 kg), SpO2 99 %.  General: Pleasant, NAD. Seen in PACU post op- she is sleepy. And has bandages.  Psych: Normal affect. Neuro: Alert and oriented X 3. Moves all extremities spontaneously. HEENT: Normal  Neck: Supple without bruits or JVD. Lungs:  Resp regular and unlabored, CTA. Heart: RRR no s3, s4, or murmurs. Abdomen: Soft, non-tender, non-distended, BS + x 4.  Extremities: No clubbing, cyanosis or edema. DP/PT/Radials 2+ and equal bilaterally.  Labs  No results for input(s): CKTOTAL, CKMB, TROPONINI in the last 72 hours. Lab Results  Component Value Date   WBC 3.2* 08/02/2014   HGB 9.9* 08/02/2014   HCT 30.9* 08/02/2014   MCV 84.7 08/02/2014   PLT 48* 08/02/2014    Recent Labs Lab 08/02/14 0936  NA 136  K 4.0  CL 103  CO2 26  BUN  18  CREATININE 1.04*  CALCIUM 8.8*  GLUCOSE 221*   No results found for: CHOL, HDL, LDLCALC, TRIG No results found for: DDIMER   Radiology/Studies  Ct Coronary Morp W/cta Cor W/score W/ca W/cm &/or Wo/cm  07/14/2014   ADDENDUM REPORT: 07/14/2014 17:55  CLINICAL DATA:  Aortic Stenosis Pre TAVR  EXAM: Cardiac TAVR CT  TECHNIQUE: The patient was scanned on a Philips 256 scanner. A 120 kV retrospective scan was triggered in the descending thoracic aorta at 111 HU's. Gantry rotation speed was 270 msecs and collimation was .9 mm. No beta blockade or nitro  were given. The 3D data set was reconstructed in 5% intervals of the R-R cycle. Systolic and diastolic phases were analyzed on a dedicated work station using MPR, MIP and VRT modes. The patient received 80 cc of contrast.  FINDINGS: Aortic Valve: Trileaflet non coronary cusp less calcified. Annulus is more spherical than usual with no calcium.  There is a normal functioning bileaflet mechanical Mitral valve  There is severe biatrial enlargement  Sinotubular Junction:  2.9 cm  Ascending Thoracic Aorta:  3.4 cm  Descending Thoracic Aorta:  1.9 cm  Sinus of Valsalva Measurements:  Non-coronary:  34 mm  Right -coronary:  32 mm  Left -coronary:  30 mm  Coronary Artery Height above Annulus:  Left Main:  15.8 mm  Right Coronary:  14.8 mm  Virtual Basal Annulus Measurements:  Maximum/Minimum Diameter:  26.7 mm x 23 mm more spherical than usual  Area:  534 mm2  Optimum Fluoroscopic Angle for Delivery: RAO 6 degrees Cranial 0 degrees  IMPRESSION: 1) Calcified trileaflet aortic valve. Spherical non calcified annulus measures 534 mm2 suitable for underinflated 29 mm Sapien 3  2) LVOT not narrow measures 2.5 cm with no anterior mitral leaflet due to MVR  3) Normal appearing bileaflet mechanical MVR  4) Severe biatrial enlargement  5) Suitable coronary height for delivery of valve  6) Optimum Angiographic angle for delivery RAO 6 degrees Cranial 0 degrees  Charlton HawsPeter Nishan    Electronically Signed   By: Charlton HawsPeter  Nishan M.D.   On: 07/14/2014 17:55   07/14/2014   EXAM: OVER-READ INTERPRETATION  CT CHEST  The following report is an over-read performed by radiologist Jamie. Irma NewnessWilliam Veazeyof [facility] on 07/14/2014. This over-read does not include interpretation of cardiac or coronary anatomy or pathology. The coronary CTA interpretation by the cardiologist is attached.  COMPARISON:  Portable chest 07/01/2003. This study was performed in conjunction with a routine CTA of the chest, abdomen and pelvis.  FINDINGS: Small mediastinal and hilar lymph nodes are better seen on accompanying chest CTA. There is a small hiatal hernia.  Cardiovascular findings are reported separately.  The lungs demonstrate patchy ground-glass opacities with mosaic attenuation. No endobronchial lesion or confluent airspace opacity identified.  The visualized upper abdomen is notable for contour irregularity of the liver suspicious for cirrhosis. The spleen appears enlarged.  IMPRESSION: 1. Mosaic attenuation in the lungs, likely related to small airways disease. 2. Nonspecific small mediastinal lymph nodes, not pathologically enlarged. 3. Probable hepatic cirrhosis and portal hypertension manifesting as splenomegaly. 4. Cardiovascular findings are dictated separately. In addition, chest CTA findings dictated separately.  Electronically Signed: By: Carey BullocksWilliam  Veazey M.D. On: 07/14/2014 12:14   Ct Angio Chest Aorta W/cm &/or Wo/cm  07/19/2014   CLINICAL DATA:  64 year old female with severe aortic stenosis. Preprocedural study prior to potential transcatheter aortic valve replacement (TAVR).  EXAM: CT ANGIOGRAPHY CHEST, ABDOMEN AND PELVIS  TECHNIQUE: Multidetector CT imaging through the chest, abdomen and pelvis was performed using the standard protocol during bolus administration of intravenous contrast. Multiplanar reconstructed images and MIPs were obtained and reviewed to evaluate the vascular anatomy.   CONTRAST:  70mL OMNIPAQUE IOHEXOL 350 MG/ML SOLN  COMPARISON:  None.  FINDINGS: CTA CHEST FINDINGS  Mediastinum/Lymph Nodes: There multiple borderline enlarged and mildly enlarged mediastinal lymph nodes, largest of which are in the prevascular nodal station measuring 11 mm in short axis, and in the high right paratracheal nodal station measuring up to 13 mm in short axis. Cardiomegaly with left atrial dilatation.  Mechanical mitral valve. Extensive calcifications of the mitral annulus and subvalvular apparatus. Extensive calcifications of the aortic valve. There is no significant pericardial fluid, thickening or pericardial calcification. Esophagus is unremarkable in appearance. Multiple large paraesophageal varices, particularly adjacent to the gastroesophageal junction. No axillary lymphadenopathy.  Lungs/Pleura: Mosaic attenuation throughout the lung parenchyma suggestive a significant air trapping. No consolidative airspace disease. No pleural effusions. No suspicious appearing pulmonary nodules or masses.  Musculoskeletal/Soft Tissues: Median sternotomy wires. There are no aggressive appearing lytic or blastic lesions noted in the visualized portions of the skeleton.  CTA ABDOMEN AND PELVIS FINDINGS  Hepatobiliary: The liver has a shrunken appearance and nodular contour, compatible with underlying cirrhosis. No definite suspicious cystic or solid hepatic lesion identified. Status post cholecystectomy. No intra or extrahepatic biliary ductal dilatation. Portal vein is mildly dilated measuring up to 15 mm in diameter.  Pancreas: Unremarkable.  Spleen: Spleen is enlarged measuring 15.3 x 7.9 x 18.5 cm (estimated splenic volume of 1,118 mL).  Adrenals/Urinary Tract: Bilateral adrenal glands are normal in appearance. Mild multifocal cortical thinning in the kidneys bilaterally, presumably chronic post infectious or inflammatory scarring.  Stomach/Bowel: The appearance of the stomach is normal. No pathologic  dilatation of small bowel or colon. Numerous colonic diverticulae are present, particularly in the sigmoid colon, without surrounding inflammatory changes to suggest an acute diverticulitis at this time. Normal appendix.  Vascular/Lymphatic: Vascular findings and measurements pertinent to potential TAVR procedure, as below. Multiple dilated veins in the upper thighs bilaterally (left greater than right), with the largest varix on the right side immediately anterior to the distal common femoral artery measuring up to 4.0 x 4.4 cm. Multiple borderline enlarged retroperitoneal and mesenteric lymph nodes.  Reproductive: Status post hysterectomy. Ovaries are unremarkable in appearance.  Other: Trace volume of ascites. Small epigastric ventral hernia containing some ascites and omental fat.  Musculoskeletal: There are no aggressive appearing lytic or blastic lesions noted in the visualized portions of the skeleton.  VASCULAR MEASUREMENTS PERTINENT TO TAVR:  AORTA:  Minimal Aortic Diameter -  13 x 13 mm  Severity of Aortic Calcification -  mild  RIGHT PELVIS:  Right Common Iliac Artery -  Minimal Diameter - 9.2 x 8.9 mm  Tortuosity - mild  Calcification - mild  Right External Iliac Artery -  Minimal Diameter - 7.5 x 7.2 mm  Tortuosity - mild  Calcification - none  Right Common Femoral Artery -  Minimal Diameter - 6.2 x 6.7 mm  Tortuosity - mild  Calcification - none  LEFT PELVIS:  Left Common Iliac Artery -  Minimal Diameter - 8.3 x 8.1 mm  Tortuosity - mild  Calcification - mild  Left External Iliac Artery -  Minimal Diameter - 6.5 x 6.7 mm  Tortuosity - mild  Calcification - none  Left Common Femoral Artery -  Minimal Diameter - 6.3 x 6.5 mm  Tortuosity - mild  Calcification - none  Review of the MIP images confirms the above findings.  IMPRESSION: 1. Vascular findings and measurements pertinent to potential TAVR procedure, as detailed above. This patient does appear to have suitable pelvic arterial access. 2. However,  this patient has multiple large venous varices, many of which overlie deep pelvic access vessels. Most notably, the largest venous varix is immediately superficial to the right common femoral vein. 3. Stigmata of cirrhosis with portal hypertension, including large esophageal varices, and splenomegaly. 4. Colonic diverticulosis without findings to suggest acute diverticulitis at this time. 5. Additional incidental findings, as above.  Electronically Signed   By: Trudie Reed M.D.   On: 07/19/2014 14:30   Ct Angio Abd/pel W/ And/or W/o  07/19/2014   CLINICAL DATA:  64 year old female with severe aortic stenosis. Preprocedural study prior to potential transcatheter aortic valve replacement (TAVR).  EXAM: CT ANGIOGRAPHY CHEST, ABDOMEN AND PELVIS  TECHNIQUE: Multidetector CT imaging through the chest, abdomen and pelvis was performed using the standard protocol during bolus administration of intravenous contrast. Multiplanar reconstructed images and MIPs were obtained and reviewed to evaluate the vascular anatomy.  CONTRAST:  70mL OMNIPAQUE IOHEXOL 350 MG/ML SOLN  COMPARISON:  None.  FINDINGS: CTA CHEST FINDINGS  Mediastinum/Lymph Nodes: There multiple borderline enlarged and mildly enlarged mediastinal lymph nodes, largest of which are in the prevascular nodal station measuring 11 mm in short axis, and in the high right paratracheal nodal station measuring up to 13 mm in short axis. Cardiomegaly with left atrial dilatation. Mechanical mitral valve. Extensive calcifications of the mitral annulus and subvalvular apparatus. Extensive calcifications of the aortic valve. There is no significant pericardial fluid, thickening or pericardial calcification. Esophagus is unremarkable in appearance. Multiple large paraesophageal varices, particularly adjacent to the gastroesophageal junction. No axillary lymphadenopathy.  Lungs/Pleura: Mosaic attenuation throughout the lung parenchyma suggestive a significant air trapping.  No consolidative airspace disease. No pleural effusions. No suspicious appearing pulmonary nodules or masses.  Musculoskeletal/Soft Tissues: Median sternotomy wires. There are no aggressive appearing lytic or blastic lesions noted in the visualized portions of the skeleton.  CTA ABDOMEN AND PELVIS FINDINGS  Hepatobiliary: The liver has a shrunken appearance and nodular contour, compatible with underlying cirrhosis. No definite suspicious cystic or solid hepatic lesion identified. Status post cholecystectomy. No intra or extrahepatic biliary ductal dilatation. Portal vein is mildly dilated measuring up to 15 mm in diameter.  Pancreas: Unremarkable.  Spleen: Spleen is enlarged measuring 15.3 x 7.9 x 18.5 cm (estimated splenic volume of 1,118 mL).  Adrenals/Urinary Tract: Bilateral adrenal glands are normal in appearance. Mild multifocal cortical thinning in the kidneys bilaterally, presumably chronic post infectious or inflammatory scarring.  Stomach/Bowel: The appearance of the stomach is normal. No pathologic dilatation of small bowel or colon. Numerous colonic diverticulae are present, particularly in the sigmoid colon, without surrounding inflammatory changes to suggest an acute diverticulitis at this time. Normal appendix.  Vascular/Lymphatic: Vascular findings and measurements pertinent to potential TAVR procedure, as below. Multiple dilated veins in the upper thighs bilaterally (left greater than right), with the largest varix on the right side immediately anterior to the distal common femoral artery measuring up to 4.0 x 4.4 cm. Multiple borderline enlarged retroperitoneal and mesenteric lymph nodes.  Reproductive: Status post hysterectomy. Ovaries are unremarkable in appearance.  Other: Trace volume of ascites. Small epigastric ventral hernia containing some ascites and omental fat.  Musculoskeletal: There are no aggressive appearing lytic or blastic lesions noted in the visualized portions of the skeleton.   VASCULAR MEASUREMENTS PERTINENT TO TAVR:  AORTA:  Minimal Aortic Diameter -  13 x 13 mm  Severity of Aortic Calcification -  mild  RIGHT PELVIS:  Right Common Iliac Artery -  Minimal Diameter - 9.2 x 8.9 mm  Tortuosity - mild  Calcification - mild  Right External Iliac Artery -  Minimal Diameter - 7.5 x 7.2 mm  Tortuosity - mild  Calcification - none  Right Common Femoral Artery -  Minimal Diameter - 6.2 x 6.7 mm  Tortuosity - mild  Calcification - none  LEFT PELVIS:  Left Common Iliac Artery -  Minimal Diameter - 8.3 x 8.1 mm  Tortuosity - mild  Calcification - mild  Left External Iliac Artery -  Minimal Diameter - 6.5 x 6.7 mm  Tortuosity - mild  Calcification - none  Left Common Femoral Artery -  Minimal Diameter - 6.3 x 6.5 mm  Tortuosity - mild  Calcification - none  Review of the MIP images confirms the above findings.  IMPRESSION: 1. Vascular findings and measurements pertinent to potential TAVR procedure, as detailed above. This patient does appear to have suitable pelvic arterial access. 2. However, this patient has multiple large venous varices, many of which overlie deep pelvic access vessels. Most notably, the largest venous varix is immediately superficial to the right common femoral vein. 3. Stigmata of cirrhosis with portal hypertension, including large esophageal varices, and splenomegaly. 4. Colonic diverticulosis without findings to suggest acute diverticulitis at this time. 5. Additional incidental findings, as above.   Electronically Signed   By: Trudie Reed M.D.   On: 07/19/2014 14:30    ASSESSMENT AND PLAN  Jamie Howell is a 64 y.o. 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   She is on chronic coumadin and was bridged with lovenox. Patient with anticipated administration of 2 units of apharesed platelets at start up dental procedure. She underwent  successful extractions of 21 teeth and 4 quadrants of alveoloplasty with Jamie. Kristin Howell today and we will admit her for observation overnight. Please see note from Jamie. Cornelius Howell on 07/26/2014 for full details on her medical history.   Severe AS- here for dental extractions prior to possible TAVR vs surgical AVR.  -- Patient will continue to use the Amicar 5% oral rinse. Patient is rinse with 10 mLs every hour for 10 hours in a swish and spit manner. She may use as needed if oozing continues.  -- Patient will be seen approximately 7-10 days for evaluation of healing and suture removal as indicated.  Post-op anemia- H/H 9.9/30.9 on 08/02/14 -- We will obtain a CBC this afternoon. Further transfusions will be administered as needed.   Thrombocytopenia- PLT 48. Given two units 2 units of apharesed platelets prior to procedure. Continue to monitor  RHD s/p mechanical MVR on chronic Coumadin -- Warfarin therapy will be restarted tomorrow if no significant oral bleeding is present. Jamie Jamie Howell did not recommend additional Lovenox injections at this time due to the significant oral bleeding noted during the extraction procedures. Holding coumadin tonight -- INR 1.39 today  I have resumed all of her home medications except coumadin. We will get a CBC this afternoon and observe overnight. Likely discharge tomorrow.   Billy Fischer, PA-C 08/05/2014, 1:37 PM  Pager [phone]

## 2014-08-05 NOTE — Anesthesia Procedure Notes (Signed)
Procedure Name: Intubation Date/Time: 08/05/2014 8:00 AM Performed by: Fransisca KaufmannMEYER, Kelena Garrow E Pre-anesthesia Checklist: Patient identified, Emergency Drugs available, Suction available, Patient being monitored and Timeout performed Patient Re-evaluated:Patient Re-evaluated prior to inductionOxygen Delivery Method: Circle system utilized Preoxygenation: Pre-oxygenation with 100% oxygen Intubation Type: IV induction Ventilation: Mask ventilation without difficulty Laryngoscope Size: Mac and 2 Grade View: Grade II Tube type: Oral Rae Tube size: 7.5 mm Number of attempts: 2 Airway Equipment and Method: Stylet Placement Confirmation: ETT inserted through vocal cords under direct vision,  positive ETCO2 and breath sounds checked- equal and bilateral Tube secured with: Tape Dental Injury: Teeth and Oropharynx as per pre-operative assessment

## 2014-08-05 NOTE — Transfer of Care (Signed)
Immediate Anesthesia Transfer of Care Note  Patient: Jamie Howell  Procedure(s) Performed: Procedure(s): Extraction of tooth #'s 3,4,5,7,8,9,10,11,12,13,17,20,21,22,23,24,25,26,27, 28,29, and 32 with alveoloplasty (N/A)  Patient Location: PACU  Anesthesia Type:General  Level of Consciousness: awake, alert , oriented and sedated  Airway & Oxygen Therapy: Patient Spontanous Breathing and Patient connected to nasal cannula oxygen  Post-op Assessment: Report given to RN, Post -op Vital signs reviewed and stable and Patient moving all extremities  Post vital signs: Reviewed and stable  Last Vitals:  Filed Vitals:   08/05/14 1030  BP: 124/74  Pulse:   Temp: 36.6 C  Resp:     Complications: No apparent anesthesia complications

## 2014-08-05 NOTE — Transfer of Care (Signed)
Immediate Anesthesia Transfer of Care Note  Patient: Jamie Howell  Procedure(s) Performed: Procedure(s): Extraction of tooth #'s 3,4,5,7,8,9,10,11,12,13,17,20,21,22,23,24,25,26,27, 28,29, and 32 with alveoloplasty (N/A)  Patient Location: PACU  Anesthesia Type:General  Level of Consciousness: awake and alert   Airway & Oxygen Therapy: Patient Spontanous Breathing and Patient connected to nasal cannula oxygen  Post-op Assessment: Report given to RN, Post -op Vital signs reviewed and stable and Patient moving all extremities  Post vital signs: Reviewed and stable  Last Vitals:  Filed Vitals:   08/05/14 1030  BP: 124/74  Pulse:   Temp: 36.6 C  Resp:     Complications: No apparent anesthesia complications

## 2014-08-05 NOTE — Progress Notes (Signed)
PRE-OPERATIVE NOTE:  08/05/2014   Jamie DessIrene C Howell [id number]  VITALS: BP 153/63 mmHg  Pulse 86  Temp(Src) 97.8 F (36.6 C) (Oral)  Resp 16  Wt 188 lb (85.276 kg)  SpO2 99%  Lab Results  Component Value Date   WBC 3.2* 08/02/2014   HGB 9.9* 08/02/2014   HCT 30.9* 08/02/2014   MCV 84.7 08/02/2014   PLT 48* 08/02/2014   BMET    Component Value Date/Time   NA 136 08/02/2014 0936   NA 136 06/08/2014 1415   K 4.0 08/02/2014 0936   K 3.7 06/08/2014 1415   CL 103 08/02/2014 0936   CO2 26 08/02/2014 0936   CO2 28 06/08/2014 1415   GLUCOSE 221* 08/02/2014 0936   GLUCOSE 254* 06/08/2014 1415   BUN 18 08/02/2014 0936   BUN 25.3 06/08/2014 1415   CREATININE 1.04* 08/02/2014 0936   CREATININE 1.2* 06/08/2014 1415   CALCIUM 8.8* 08/02/2014 0936   CALCIUM 8.8 06/08/2014 1415   GFRNONAA 56* 08/02/2014 0936   GFRAA >60 08/02/2014 0936    Lab Results  Component Value Date   INR 2.17* 08/02/2014   INR 1.22 06/28/2014   INR 1.16 08/02/2009   No results found for: PTT   Jamie Howell presents for multiple dental extractions with alveoloplasty and pre-prosthetic surgery as indicated in the operating room with general anesthesia. Patient with anticipated preop administration of platelets prior to invasive dental procedures due to thrombocytopenia.  SUBJECTIVE: The patient denies any acute medical or dental changes and agrees to proceed with treatment as planned.  EXAM: No sign of acute dental changes.  ASSESSMENT: Patient is affected by chronic apical periodontitis, multiple retained root segments, dental caries, chronic periodontitis, and tooth mobility.  PLAN: Patient agrees to proceed with treatment as planned in the operating room as previously discussed and accepts the risks, benefits, and complications of the proposed treatment. Patient is aware of the risk for bleeding, bruising, swelling, infection, pain, nerve damage, soft tissue damage, sinus involvement, root  tip fracture, mandible fracture, and the risks of complications associated with the anesthesia. Patient is aware of the risk for complications up to and including death due to her overall cardiovascular compromise. Patient also is aware of the potential for other complications not mentioned above.   Charlynne Panderonald F. Alessandria Henken, DDS

## 2014-08-06 ENCOUNTER — Other Ambulatory Visit (HOSPITAL_COMMUNITY): Payer: Self-pay | Admitting: Dentistry

## 2014-08-06 DIAGNOSIS — I350 Nonrheumatic aortic (valve) stenosis: Secondary | ICD-10-CM

## 2014-08-06 DIAGNOSIS — Z954 Presence of other heart-valve replacement: Secondary | ICD-10-CM | POA: Diagnosis not present

## 2014-08-06 DIAGNOSIS — K08109 Complete loss of teeth, unspecified cause, unspecified class: Secondary | ICD-10-CM

## 2014-08-06 DIAGNOSIS — I4891 Unspecified atrial fibrillation: Secondary | ICD-10-CM | POA: Diagnosis not present

## 2014-08-06 LAB — TYPE AND SCREEN
ABO/RH(D): A POS
ANTIBODY SCREEN: NEGATIVE

## 2014-08-06 LAB — CBC
HCT: 26.8 % — ABNORMAL LOW (ref 36.0–46.0)
HEMOGLOBIN: 8.6 g/dL — AB (ref 12.0–15.0)
MCH: 27.2 pg (ref 26.0–34.0)
MCHC: 32.1 g/dL (ref 30.0–36.0)
MCV: 84.8 fL (ref 78.0–100.0)
PLATELETS: 65 10*3/uL — AB (ref 150–400)
RBC: 3.16 MIL/uL — ABNORMAL LOW (ref 3.87–5.11)
RDW: 17.6 % — ABNORMAL HIGH (ref 11.5–15.5)
WBC: 3.3 10*3/uL — AB (ref 4.0–10.5)

## 2014-08-06 LAB — COMPREHENSIVE METABOLIC PANEL
ALBUMIN: 2.9 g/dL — AB (ref 3.5–5.0)
ALK PHOS: 78 U/L (ref 38–126)
ALT: 18 U/L (ref 14–54)
AST: 28 U/L (ref 15–41)
Anion gap: 8 (ref 5–15)
BUN: 16 mg/dL (ref 6–20)
CO2: 28 mmol/L (ref 22–32)
Calcium: 8.4 mg/dL — ABNORMAL LOW (ref 8.9–10.3)
Chloride: 100 mmol/L — ABNORMAL LOW (ref 101–111)
Creatinine, Ser: 1.06 mg/dL — ABNORMAL HIGH (ref 0.44–1.00)
GFR calc non Af Amer: 54 mL/min — ABNORMAL LOW (ref >60)
GLUCOSE: 112 mg/dL — AB (ref 70–99)
POTASSIUM: 4 mmol/L (ref 3.5–5.1)
SODIUM: 136 mmol/L (ref 135–145)
Total Bilirubin: 2.6 mg/dL — ABNORMAL HIGH (ref 0.3–1.2)
Total Protein: 6.5 g/dL (ref 6.5–8.1)

## 2014-08-06 LAB — PREPARE PLATELET PHERESIS
UNIT DIVISION: 0
Unit division: 0
Unit division: 0

## 2014-08-06 LAB — PROTIME-INR
INR: 1.28 (ref 0.00–1.49)
Prothrombin Time: 16.2 seconds — ABNORMAL HIGH (ref 11.6–15.2)

## 2014-08-06 MED ORDER — CHLORHEXIDINE GLUCONATE 0.12 % MT SOLN: 15.0000 mL | Freq: Two times a day (BID) | OROMUCOSAL | Status: DC

## 2014-08-06 MED ORDER — CHLORHEXIDINE GLUCONATE 0.12 % MT SOLN: OROMUCOSAL | Status: DC

## 2014-08-06 MED ORDER — SODIUM CHLORIDE 0.9 % IR SOLN: 200.0000 mL | Status: DC

## 2014-08-06 MED ORDER — CHLORHEXIDINE GLUCONATE 0.12 % MT SOLN
15.0000 mL | Freq: Two times a day (BID) | OROMUCOSAL | Status: DC
  Filled 2014-08-06 (×2): qty 15

## 2014-08-06 MED ORDER — WARFARIN SODIUM 2.5 MG PO TABS: 5.0000 mg | ORAL_TABLET | Freq: Every day | ORAL | Status: DC

## 2014-08-06 MED ORDER — OXYCODONE-ACETAMINOPHEN 5-325 MG PO TABS: ORAL_TABLET | ORAL | Status: DC

## 2014-08-06 NOTE — Progress Notes (Signed)
POST OPERATIVE NOTE: Post op day #1  08/06/2014    Danielle DessIrene C Goll [id number]  VITALS: BP 122/52 mmHg  Pulse 83  Temp(Src) 98.2 F (36.8 C) (Oral)  Resp 18  Ht 4\' 11"  (1.499 m)  Wt 192 lb 0.3 oz (87.1 kg)  BMI 38.76 kg/m2  SpO2 100%  LABS: CBC Latest Ref Rng 08/06/2014 08/05/2014 08/02/2014  WBC 4.0 - 10.5 K/uL 3.3(L) 4.0 3.2(L)  Hemoglobin 12.0 - 15.0 g/dL 8.6(V8.6(L) 7.8(I9.4(L) 6.9(G9.9(L)  Hematocrit 36.0 - 46.0 % 26.8(L) 28.9(L) 30.9(L)  Platelets 150 - 400 K/uL 65(L) 75(L) 48(L)     BMET    Component Value Date/Time   NA 136 08/06/2014 0359   NA 136 06/08/2014 1415   K 4.0 08/06/2014 0359   K 3.7 06/08/2014 1415   CL 100* 08/06/2014 0359   CO2 28 08/06/2014 0359   CO2 28 06/08/2014 1415   GLUCOSE 112* 08/06/2014 0359   GLUCOSE 254* 06/08/2014 1415   BUN 16 08/06/2014 0359   BUN 25.3 06/08/2014 1415   CREATININE 1.06* 08/06/2014 0359   CREATININE 1.2* 06/08/2014 1415   CALCIUM 8.4* 08/06/2014 0359   CALCIUM 8.8 06/08/2014 1415   GFRNONAA 54* 08/06/2014 0359   GFRAA >60 08/06/2014 0359    Lab Results  Component Value Date   INR 1.28 08/06/2014   INR 1.31 08/05/2014   INR 1.39 08/05/2014   No results found for: PTT   Danielle Dessrene C Nicasio is status post multiple extraction of remaining teeth with alveoloplasty in the OR with GA. Patient had 3 units of platelets administered throughout dental procedure secondary to her chronic thrombocytopenia. Last three CBC's and BMET were reviewed.  SUBJECTIVE: Patient currently denies significant discomfort. Patient recently took a Percocet with good pain relief along with use of the ice pack to her face. Patient is currently eating a soft diet with minimal problems.  EXAM: There is no sign of infection, heme, or ooze. Sutures are intact. Clots are present. Significant intraoral and extraoral bruising and swelling is noted consistent with the dental procedures performed yesterday.  ASSESSMENT: Post operative course is consistent with  dental procedures performed in the OR. Patient is now edentulous.  PLAN: 1. Chlorhexidine gluconate rinses twice daily after breakfast and at bedtime. 2. Use salt water rinses every 2 hours while awake in between the chlorhexidine rinses. 3. Advance diet as tolerated. Discussed use of Ensure Plus or Boost with extra protein to supplement her diet. 4. Return to dental medicine in 7-10 days for evaluation for suture removal and healing. 5. Continue warfarin and aspirin therapy at this time per cardiology. 6. Call if problems arise with healing.   Charlynne Panderonald F. Addilee Neu, DDS

## 2014-08-06 NOTE — Progress Notes (Signed)
Patient Name: Jamie Howell Date of Encounter: 08/06/2014   Principal Problem:   S/P tooth extraction Active Problems:   H/O mitral valve replacement with mechanical valve   Severe aortic valve stenosis   HTN (hypertension)   Chronic recurrent paroxysmal atrial fibrillation   Chronic diastolic CHF (congestive heart failure)   S/P mitral valve replacement with metallic valve   Obesity (BMI 16-1030-39.9)   Thrombocytopenia   Hypothyroidism    SUBJECTIVE  Mouth hurts but overall doing well.  No oral bleeding.  No c/p or sob. Eager to go home.   CURRENT MEDS . amLODipine  2.5 mg Oral Daily  . aspirin EC  81 mg Oral Q lunch  . chlorhexidine  15 mL Mouth/Throat BID  . cyclobenzaprine  5 mg Oral QHS  . furosemide  40 mg Oral BID  . levothyroxine  150 mcg Oral QAC breakfast  . metoprolol succinate  50 mg Oral QHS  . potassium chloride  10 mEq Oral Q lunch  . sodium chloride  3 mL Intravenous Q12H    OBJECTIVE  Filed Vitals:   08/05/14 1420 08/05/14 2015 08/06/14 0022 08/06/14 0523  BP: 129/61 119/65 113/65 122/52  Pulse: 92 85 87 83  Temp: 98.5 F (36.9 C) 98 F (36.7 C)  98.2 F (36.8 C)  TempSrc: Axillary Oral  Oral  Resp: 20 18  18   Height:    4\' 11"  (1.499 m)  Weight:    192 lb 0.3 oz (87.1 kg)  SpO2: 95% 100%  100%    Intake/Output Summary (Last 24 hours) at 08/06/14 0936 Last data filed at 08/06/14 0523  Gross per 24 hour  Intake 832.33 ml  Output    750 ml  Net  82.33 ml   Filed Weights   08/05/14 0543 08/06/14 0523  Weight: 188 lb (85.276 kg) 192 lb 0.3 oz (87.1 kg)    PHYSICAL EXAM  General: Pleasant, NAD. Neuro: Alert and oriented X 3. Moves all extremities spontaneously. Psych: Normal affect. HEENT:  Normal.  No oral bleeding.  Neck: Supple without bruits or JVD. Lungs:  Resp regular and unlabored, CTA. Heart: RRR no s3, s4, or murmurs. Abdomen: Soft, non-tender, non-distended, BS + x 4.  Extremities: No clubbing, cyanosis or edema. Multiple  varicosities. DP/PT/Radials 2+ and equal bilaterally.  Accessory Clinical Findings  CBC  Recent Labs  08/05/14 1520 08/06/14 0359  WBC 4.0 3.3*  HGB 9.4* 8.6*  HCT 28.9* 26.8*  MCV 83.5 84.8  PLT 75* 65*   Basic Metabolic Panel  Recent Labs  08/06/14 0359  NA 136  K 4.0  CL 100*  CO2 28  GLUCOSE 112*  BUN 16  CREATININE 1.06*  CALCIUM 8.4*   Liver Function Tests  Recent Labs  08/06/14 0359  AST 28  ALT 18  ALKPHOS 78  BILITOT 2.6*  PROT 6.5  ALBUMIN 2.9*   TELE  Rsr, pvc's.  ASSESSMENT AND PLAN  1.  Dental Caries/s/p tooth extractions:  Doing well s/p 21 tooth extractions yesterday.  Appreciate dental medicine recs.  Will avoid lovenox bridging and instead try to aggressively get INR therapeutic over the weekend with plan for f/u INR on Monday.  2.  S/P SJM Mech MV:  As above, no lovenox bridging @ this time in setting of multiple tooth extractions and high risk for bleeding.  Coumadin resumed.  On ASA.  Plan f/u INR on Monday. If INR subRx @ that time, will likely have to add lovenox bridge.  3.  Severe Ao Stenosis:  Pending further evaluation for SAVR/TAVR.  Currently asymptomatic @ rest.  4. Chronic diast CHF:  Euvolemic.  HR/BP well controlled.  5.  PAF: in sinus.  Cont bb.  CHA2DS2VASc = 3.  On coumadin.  6.  Normocytic anemia: stable.  7.  Thrombocytopenia:  Plts down this AM.  F/U next week.  Signed, Nicolasa Duckinghristopher Berge NP  I have seen and examined the patient along with Nicolasa Duckinghristopher Berge NP.  I have reviewed the chart, notes and new data.  I agree with NP's note.  Key new complaints: expected pain folowing dental extractions Key examination changes: sharp prosthetic valve clicks Key new findings / data: INR 1.28  PLAN: DC today with early recheck of INR next Monday.  Thurmon FairMihai Alauna Hayden, MD, [facility] [phone] 08/06/2014, 10:17 AM

## 2014-08-06 NOTE — Discharge Summary (Addendum)
Discharge Summary   Patient ID: Jamie Howell,  MRN: [id number], DOB/AGE: 64/12/1950 64 y.o.  Admit date: 08/05/2014 Discharge date: 08/06/2014  Primary Care Provider: Londell MohPHARR,[name] Primary Cardiologist: Judie PetitM. Excell Seltzerooper, MD   Discharge Diagnoses Principal Problem:   S/P tooth extraction Active Problems:   H/O mitral valve replacement with mechanical valve   Severe aortic valve stenosis   HTN (hypertension)   Chronic recurrent paroxysmal atrial fibrillation   Chronic diastolic CHF (congestive heart failure)   S/P mitral valve replacement with metallic valve   Obesity (BMI 34-7430-39.9)   Thrombocytopenia   Hypothyroidism  Allergies Allergies  Allergen Reactions  . Adhesive [Tape] Itching    Adhesive tape and backing  . Penicillins Rash  . Sulfa Antibiotics Rash   Procedures  Extraction of tooth #'s 3,4,5,7,8,9,10,11,12,13,17,20,21,22,23,24,25,26,27, 28,29, and 32 with alveoloplasty (N/A).  History of Present Illness  64 y/o female with a history of paroxysmal atrial fibrillation, mitral valve replacement on chronic Coumadin anticoagulation, and severe aortic stenosis. She has recently been undergoing evaluation for aortic valve replacement with either TAVR or surgical replacement. Given a history of periodontal disease, it was felt that she would require dental medicine evaluation. She was seen by dental medicine and it was determined that she would require multiple tooth extractions. Given her history of chronic anticoagulation, arrangements were made for bridging with Lovenox and Coumadin was placed on hold. Also, secondary to history of thrombocytopenia, it was determined that she would require platelet transfusion prior to extraction.  Hospital Course  [facility]. She received 2 units of apheresis platelets and subtotally underwent multiple tooth extractions outlined above. She had no evidence of bleeding overnight. Dental medicine has  recommended resumption of Coumadin without bridging as her bleeding risk is quite high at this time. We will discharge her today on 5 mg of Coumadin daily and she will follow-up with her primary care provider on Monday, May 9 for repeat INR. His INR remained subtherapeutic at that time, she will likely need Lovenox bridging at a dose of 1 mg/kg twice a day.  Discharge Vitals Blood pressure 122/52, pulse 83, temperature 98.2 F (36.8 C), temperature source Oral, resp. rate 18, height 4\' 11"  (1.499 m), weight 192 lb 0.3 oz (87.1 kg), SpO2 100 %.  Filed Weights   08/05/14 0543 08/06/14 0523  Weight: 188 lb (85.276 kg) 192 lb 0.3 oz (87.1 kg)    Labs  CBC  Recent Labs  08/05/14 1520 08/06/14 0359  WBC 4.0 3.3*  HGB 9.4* 8.6*  HCT 28.9* 26.8*  MCV 83.5 84.8  PLT 75* 65*   Basic Metabolic Panel  Recent Labs  08/06/14 0359  NA 136  K 4.0  CL 100*  CO2 28  GLUCOSE 112*  BUN 16  CREATININE 1.06*  CALCIUM 8.4*   Liver Function Tests  Recent Labs  08/06/14 0359  AST 28  ALT 18  ALKPHOS 78  BILITOT 2.6*  PROT 6.5  ALBUMIN 2.9*   Disposition  Pt is being discharged home today in good condition.  Follow-up Plans & Appointments  Follow-up Information    Follow up with Charlynne PanderKULINSKI,RONALD F, DDS On 08/16/2014.   Specialty:  Dentistry   Why:  For suture removal, For wound re-check   Contact information:   [address] [place] [address] [phone]       Follow up with Purcell Nailslarence H Owen, MD On 08/16/2014.   Specialty:  Cardiothoracic Surgery   Why:  9:30 AM  Contact information:   [address] [place] [address] [phone]       Follow up with Londell MohPHARR,[name], MD On 08/09/2014.   Specialty:  Internal Medicine   Why:  for INR   Contact information:   [address] [place] [address] [phone]       Discharge Medications    Medication List    STOP taking these medications        enoxaparin 40  MG/0.4ML injection  Commonly known as:  LOVENOX      TAKE these medications        amLODipine 2.5 MG tablet  Commonly known as:  NORVASC  Take 1 tablet (2.5 mg total) by mouth daily.     aspirin EC 81 MG tablet  Take 1 tablet (81 mg total) by mouth daily.     CALCIUM 1000 + D PO  Take 1,000 mg by mouth daily with lunch.     chlorhexidine 0.12 % solution  Commonly known as:  PERIDEX  Use as directed 15 mLs in the mouth or throat 2 (two) times daily.     cyclobenzaprine 10 MG tablet  Commonly known as:  FLEXERIL  Take 5 mg by mouth at bedtime.     furosemide 40 MG tablet  Commonly known as:  LASIX  Take 1 tablet (40 mg total) by mouth 2 (two) times daily.     levothyroxine 150 MCG tablet  Commonly known as:  SYNTHROID, LEVOTHROID  Take 150 mcg by mouth daily before breakfast.     metoprolol succinate 50 MG 24 hr tablet  Commonly known as:  TOPROL-XL  Take 1 tablet (50 mg total) by mouth daily. Take with or immediately following a meal.     potassium chloride 10 MEQ tablet  Commonly known as:  K-DUR  Take 10 mEq by mouth daily with lunch.     warfarin 2.5 MG tablet  Commonly known as:  COUMADIN  Take 2 tablets (5 mg total) by mouth daily.       Outstanding Labs/Studies  F/U INR on Monday.  Duration of Discharge Encounter   Greater than 30 minutes including physician time.  Signed, Nicolasa Duckinghristopher Berge NP 08/06/2014, 10:19 AM

## 2014-08-09 ENCOUNTER — Other Ambulatory Visit: Payer: Self-pay | Admitting: Cardiology

## 2014-08-09 ENCOUNTER — Encounter (HOSPITAL_COMMUNITY): Payer: Self-pay | Admitting: Dentistry

## 2014-08-11 ENCOUNTER — Encounter: Payer: Self-pay | Admitting: Physical Therapy

## 2014-08-11 ENCOUNTER — Ambulatory Visit: Payer: 59 | Attending: Thoracic Surgery (Cardiothoracic Vascular Surgery) | Admitting: Physical Therapy

## 2014-08-11 DIAGNOSIS — R293 Abnormal posture: Secondary | ICD-10-CM | POA: Diagnosis not present

## 2014-08-11 DIAGNOSIS — R0602 Shortness of breath: Secondary | ICD-10-CM

## 2014-08-11 DIAGNOSIS — I350 Nonrheumatic aortic (valve) stenosis: Secondary | ICD-10-CM

## 2014-08-11 DIAGNOSIS — R262 Difficulty in walking, not elsewhere classified: Secondary | ICD-10-CM | POA: Diagnosis not present

## 2014-08-11 NOTE — Therapy (Signed)
[facility] [address] [place], [place], [address] Phone: [phone]   Fax:  [phone]  Physical Therapy Evaluation  Patient Details  Name: Jamie Howell MRN: [id number] Date of Birth: 07/03/1950 Referring Provider:  Purcell Nailswen, Clarence H, MD  Encounter Date: 08/11/2014      PT End of Session - 08/11/14 1001    Visit Number 1   PT Start Time 1001   PT Stop Time 1036   PT Time Calculation (min) 35 min      Past Medical History  Diagnosis Date  . HTN (hypertension)   . Thyrotoxicosis      without mention of goiter or other cause, without mention of thyrotoxic crisis or storm  . Hypothyroidism   . 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 Atrial fibrillation   . Atrial flutter   . Heart disease   . Hematoma   . Subdural hematoma   . Pulmonary HTN     mild  . OA (ocular albinism)   . Aortic valve stenosis     moderate aortic valve stenosis with moderate aortic insufficiency, stable MVR, trivial PR, mild TR, severe LAE, grade II-III diastolic dysfunction,mild pulmonary HTN , EF 50% by echo 12/2012  . 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 Chronic recurrent paroxysmal atrial fibrillation   . Obesity (BMI 30-39.9) 07/08/2014  . Splenomegaly 07/14/2014  . Portal hypertension with esophageal varices 07/14/2014  . Heart murmur   . Temporary low platelet count     Past Surgical History  Procedure Laterality Date  . Mitral valve replacement  04/08/1991    31mm St Jude mechanical valve - Dr Andrey CampanileWilson  . Left and right heart catheterization with coronary angiogram N/A 06/28/2014    Procedure: LEFT AND RIGHT HEART CATHETERIZATION WITH CORONARY ANGIOGRAM;  Surgeon: Tonny BollmanMichael Cooper, MD;  [facility];  Service: Cardiovascular;  Laterality: N/A;  . Subdural hematoma evacuation via  craniotomy  2000    Dr Dutch QuintPoole  . Cholecystectomy  05-30-90  . Total abdominal hysterectomy  07-23-1994  . Tonsillectomy    . Multiple extractions with alveoloplasty N/A 08/05/2014    Procedure: Extraction of tooth #'s 3,4,5,7,8,9,10,11,12,13,17,20,21,22,23,24,25,26,27, 28,29, and 32 with alveoloplasty;  Surgeon: Charlynne Panderonald F Kulinski, DDS;  Location: [facility] OR;  Service: Oral Surgery;  Laterality: N/A;    There were no vitals filed for this visit.  Visit Diagnosis:  Difficulty walking - Plan: PT plan of care cert/re-cert  Abnormal posture - Plan: PT plan of care cert/re-cert  Shortness of breath - Plan: PT plan of care cert/re-cert  Severe aortic stenosis - Plan: PT plan of care cert/re-cert      Subjective Assessment - 08/11/14 1004    Subjective reports difficulty walking over past few months, especially notices on unlevel and outdoor surfaces, symptoms primarily SOB and leg cramps   Patient Stated Goals improve moblity   Currently in Pain? No/denies            [facility] - 08/11/14 0001    Assessment   Medical Diagnosis severe aortic stenosis   Onset Date 04/12/14  approximate   Precautions   Precautions None   Restrictions   Weight Bearing Restrictions No   Balance Screen   Has the patient fallen in the past 6 months No   Has the patient had  a decrease in activity level because of a fear of falling?  No   Is the patient reluctant to leave their home because of a fear of falling?  No   Home Environment   Living Enviornment Private residence   Living Arrangements Children  son   Type of Home Apartment   Home Access Stairs to enter   Entrance Stairs-Number of Steps 14   Entrance Stairs-Rails Left   Prior Function   Level of Independence --  independent with community mobility, no limitations   Vocation Full time employment   OptometristVocation Requirements clerk   Posture/Postural Control   Posture/Postural Control Postural limitations   Postural Limitations Forward  head;Rounded Shoulders   ROM / Strength   AROM / PROM / Strength AROM;Strength   AROM   Overall AROM Comments grossly WFL   Strength   Overall Strength Comments grossly 4/5 throughout   Strength Assessment Site Hand   Right/Left hand Right;Left   Grip (lbs) R 40 lbs  R hand dominant   Grip (lbs) L 31 lbs          [facility] Pre-Surgical Assessment - 08/11/14 0001    5 Meter Walk Test- trial 1 5.6 sec   5 Meter Walk Test- trial 2 6 sec.    5 Meter Walk Test- trial 3 5.8 sec.  </= 6 sec WNL   5 meter walk test average 5.8 sec   Timed Up & Go Test trial  13.3 sec.   Comments >/= 12 seconds indicates increased fall risk   4 Stage Balance Test tolerated for:  10 sec.   4 Stage Balance Test Position 4   comment not indicative of high fall risk   Sit To Stand Test- trial 1 15.7 sec.   Comment Normal for Age = 11.4 seconds   ADL/IADL Independent with: Bathing;Dressing;Meal prep;Finances   ADL/IADL Needs Assistance with: [employer] work  doesn't need to but would need assist   ADL/IADL Fraility Index Vulnerable   6 Minute Walk- Baseline yes   BP (mmHg) 146/70 mmHg   HR (bpm) 82   02 Sat (%RA) 96 %   Modified Borg Scale for Dyspnea 0- Nothing at all   Perceived Rate of Exertion (Borg) 6-   6 Minute Walk Post Test yes   BP (mmHg) 146/80 mmHg   HR (bpm) 100   02 Sat (%RA) 88 %   Modified Borg Scale for Dyspnea 0.5- Very, very slight shortness of breath   Perceived Rate of Exertion (Borg) 7- Very, very light   Aerobic Endurance Distance Walked 754   Endurance additional comments Required 1 rest break approximtely 1 minute due to O2 at 86%.                                     Plan - 08/11/14 1040    Clinical Impression Statement Pt is a 64 yo female presenting to OP PT for evaluation prior to possible TAVR surgery for severe aortic stenosis. Pt's primary c/o is difficulty walking on unlevel ground which impairs her ability to independnetly get groceries and  perform daily household tasks. 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 Pt scored at a high fall risk only on the Timed up and Go. She demonstrated mild SOB with 6 minute walk but did have to rest due to oxygen desturation to 86% during the test. She recovered after  1 minute and was able to resume. Post test she was ay 88% O2.     PT Frequency One time visit         Problem List Patient Active Problem List   Diagnosis Date Noted  . Severe aortic valve stenosis 08/06/2014  . S/P tooth extraction 08/05/2014  . H/O mitral valve replacement with mechanical valve   . Thrombocytopenia 07/22/2014  . Splenomegaly 07/14/2014  . Portal hypertension with esophageal varices 07/14/2014  . Obesity (BMI 30-39.9) 07/08/2014  . Severe aortic stenosis 06/28/2014  . SOB (shortness of breath) 05/07/2014  . Moderate aortic insufficiency 01/19/2013  . Chronic diastolic CHF (congestive heart failure) 01/19/2013  . Pulmonary HTN 01/19/2013  . 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 OA (ocular albinism)   . HTN (hypertension)   . Thyrotoxicosis   . Hypothyroidism   . Mitral valve disorder   . Chronic recurrent paroxysmal atrial fibrillation   . S/P mitral valve replacement with metallic valve 04/08/1991    Anne Boltz, PT 08/11/2014, 10:48 AM  [facility] [address] [place], [place], [address] Phone: [phone]   Fax:  [phone]

## 2014-08-16 ENCOUNTER — Ambulatory Visit (HOSPITAL_COMMUNITY): Payer: Medicaid - Dental | Admitting: Dentistry

## 2014-08-16 ENCOUNTER — Ambulatory Visit: Payer: 59 | Admitting: Thoracic Surgery (Cardiothoracic Vascular Surgery)

## 2014-08-16 ENCOUNTER — Encounter (HOSPITAL_COMMUNITY): Payer: Self-pay | Admitting: Dentistry

## 2014-08-16 VITALS — BP 136/59 | HR 76 | Temp 97.6°F

## 2014-08-16 DIAGNOSIS — Z01818 Encounter for other preprocedural examination: Secondary | ICD-10-CM

## 2014-08-16 DIAGNOSIS — K08109 Complete loss of teeth, unspecified cause, unspecified class: Secondary | ICD-10-CM

## 2014-08-16 DIAGNOSIS — D696 Thrombocytopenia, unspecified: Secondary | ICD-10-CM

## 2014-08-16 DIAGNOSIS — K0820 Unspecified atrophy of edentulous alveolar ridge: Secondary | ICD-10-CM

## 2014-08-16 DIAGNOSIS — Z7901 Long term (current) use of anticoagulants: Secondary | ICD-10-CM

## 2014-08-16 DIAGNOSIS — Z954 Presence of other heart-valve replacement: Secondary | ICD-10-CM

## 2014-08-16 DIAGNOSIS — I350 Nonrheumatic aortic (valve) stenosis: Secondary | ICD-10-CM

## 2014-08-16 NOTE — Patient Instructions (Signed)
PLAN: 1. Continue chlorhexidine gluconate rinses twice daily as prescribed for the next 3-4 months. 2. Use salt water rinses as needed to aid healing in between the chlorhexidine rinses. 3. Maintain soft diet at this time for the next 1-2 weeks. Use and Ensure plus or Boost supplements as needed to maintain protein content in her diet. 4. Return to clinic in 4-5 weeks for re-evaluation of healing. Call if problems arise with healing before then. 5. The patient is currently cleared for heart valve surgery at Dr. Orvan Julywen's discretion.   Charlynne Panderonald F. Kulinski, DDS

## 2014-08-16 NOTE — Progress Notes (Signed)
POST OPERATIVE NOTE:  08/16/2014   Jamie Howell [id number]  VITALS: BP 136/59 mmHg  Pulse 76  Temp(Src) 97.6 F (36.4 C) (Oral)  LABS:  Lab Results  Component Value Date   WBC 3.3* 08/06/2014   HGB 8.6* 08/06/2014   HCT 26.8* 08/06/2014   MCV 84.8 08/06/2014   PLT 65* 08/06/2014   BMET    Component Value Date/Time   NA 136 08/06/2014 0359   NA 136 06/08/2014 1415   K 4.0 08/06/2014 0359   K 3.7 06/08/2014 1415   CL 100* 08/06/2014 0359   CO2 28 08/06/2014 0359   CO2 28 06/08/2014 1415   GLUCOSE 112* 08/06/2014 0359   GLUCOSE 254* 06/08/2014 1415   BUN 16 08/06/2014 0359   BUN 25.3 06/08/2014 1415   CREATININE 1.06* 08/06/2014 0359   CREATININE 1.2* 06/08/2014 1415   CALCIUM 8.4* 08/06/2014 0359   CALCIUM 8.8 06/08/2014 1415   GFRNONAA 54* 08/06/2014 0359   GFRAA >60 08/06/2014 0359    Lab Results  Component Value Date   INR 1.28 08/06/2014   INR 1.31 08/05/2014   INR 1.39 08/05/2014   No results found for: PTT   Jamie Howell is status post extraction of remaining teeth with alveoloplasty in the operating room on 08/05/2014.  SUBJECTIVE: Patient with minimal discomfort. Patient does indicate the upper left jaw hurts from time to time.   EXAM: There is no sign of infection, heme, or ooze. Sutures are loosely intact. Patient has generalized primary closure but there are several areas involving upper left canine, lower left and lower right molar areas that will need to heal in by secondary intention.   PROCEDURE: The patient was given a chlorhexidine gluconate rinse for 30 seconds. Sutures were then removed without complication. Patient tolerated the procedure well.  ASSESSMENT: Post operative course is consistent with dental procedures performed in the OR with [facility]. The patient is edentulous.  There is atrophy of the edentulous alveolar ridges.  PLAN: 1. Continue chlorhexidine gluconate rinses twice daily as prescribed for the next 3-4  months. 2. Use salt water rinses as needed to aid healing in between the chlorhexidine rinses. 3. Maintain soft diet at this time for the next 1-2 weeks. Use and Ensure plus or Boost supplements as needed to maintain protein content in her diet. 4. Return to clinic in 4-5 weeks for re-evaluation of healing. Call if problems arise with healing before then. 5. The patient is currently cleared for heart valve surgery at Dr. Orvan Julywen's discretion.   Charlynne Panderonald F. Kulinski, DDS

## 2014-08-17 ENCOUNTER — Ambulatory Visit (INDEPENDENT_AMBULATORY_CARE_PROVIDER_SITE_OTHER): Payer: 59 | Admitting: Thoracic Surgery (Cardiothoracic Vascular Surgery)

## 2014-08-17 ENCOUNTER — Encounter: Payer: Self-pay | Admitting: Thoracic Surgery (Cardiothoracic Vascular Surgery)

## 2014-08-17 VITALS — BP 132/74 | HR 82 | Resp 20 | Ht 59.0 in | Wt 178.0 lb

## 2014-08-17 DIAGNOSIS — I350 Nonrheumatic aortic (valve) stenosis: Secondary | ICD-10-CM | POA: Diagnosis not present

## 2014-08-17 NOTE — Progress Notes (Addendum)
[facility]   MULTIDISCIPLIN[facility]ART VALVE CLINIC       CARDIOTHORACIC SURGERY NOTE  Referring Provider is Tonny Bollman, MD PCP is Londell Moh, MD   HPI:  Patient returns to the office today for follow-up of severe symptomatic aortic stenosis with underlying long-standing rheumatic heart disease and chronic diastolic congestive heart failure. She was originally seen in consultation on 07/08/2014 and more recently on 07/26/2014. Since then she underwent dental extraction by Dr. Robin Searing.  She returns to the office for follow-up today with hopes to make definitive plans for surgical intervention in the near future. She is still recovering from her dental extraction, which she said was reportedly very challenging. However, she has not had any complications. She still feels quite tired and does not feel up to going back to work. She reports no changes in her chronic symptoms of exertional shortness of breath and fatigue. The remainder of her review of systems is unchanged.   Current Outpatient Prescriptions  Medication Sig Dispense Refill  . amLODipine (NORVASC) 2.5 MG tablet TAKE ONE TABLET BY MOUTH ONCE DAILY 30 tablet 3  . aspirin EC 81 MG tablet Take 1 tablet (81 mg total) by mouth daily. (Patient taking differently: Take 81 mg by mouth daily with lunch. )    . Calcium Carb-Cholecalciferol (CALCIUM 1000 + D PO) Take 1,000 mg by mouth daily with lunch.     . chlorhexidine (PERIDEX) 0.12 % solution Use as directed 15 mLs in the mouth or throat 2 (two) times daily. 120 mL 0  . chlorhexidine (PERIDEX) 0.12 % solution Rinse with 15 mls twice daily for 30 seconds. Use after breakfast and at bedtime. Spit out excess. Do not swallow. 480 mL prn  . cyclobenzaprine (FLEXERIL) 10 MG tablet Take 5 mg by mouth at bedtime.     . enoxaparin (LOVENOX) 40 MG/0.4ML injection     . furosemide (LASIX) 40 MG tablet Take 1 tablet (40 mg total) by mouth 2 (two) times daily.    Marland Kitchen  levothyroxine (SYNTHROID, LEVOTHROID) 150 MCG tablet Take 150 mcg by mouth daily before breakfast.    . metoprolol succinate (TOPROL-XL) 50 MG 24 hr tablet TAKE ONE TABLET BY MOUTH ONCE DAILY WITH A MEAL OR IMMEDIATELY FOLLOWING A MEAL. 30 tablet 3  . oxyCODONE-acetaminophen (PERCOCET) 5-325 MG per tablet Take one or two tablets by mouth every 4-6 hours as needed for pain. 40 tablet 0  . potassium chloride (K-DUR) 10 MEQ tablet Take 10 mEq by mouth daily with lunch.     . potassium chloride (K-DUR,KLOR-CON) 10 MEQ tablet Take 1 tablet (10 mEq total) by mouth daily. 30 tablet 3  . warfarin (COUMADIN) 2.5 MG tablet Take 2 tablets (5 mg total) by mouth daily.     No current facility-administered medications for this visit.      Physical Exam:   BP 132/74 mmHg  Pulse 82  Resp 20  Ht  (1.499 m)  Wt 178 lb (80.74 kg)  BMI 35.93 kg/m2  SpO2 98%  General:  Obese but well appearing  Chest:   Clear to auscultation  CV:   Regular rate and rhythm with systolic murmur and mechanical heart valve sounds  Incisions:  n/a  Abdomen:  Soft and nontender  Extremities:  Warm and well-perfused  Diagnostic Tests:  n/a   Impression:  Patient has stage D severe symptomatic aortic stenosis. She presents with a several month history of worsening symptoms of exertional shortness of breath and fatigue  consistent with chronic diastolic congestive heart failure, currently [place] Heart Association functional class 3. 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 I have personally reviewed the patient's recent transthoracic echocardiogram and diagnostic cardiac catheterization. The aortic valve appears tricuspid with severe thickening and restricted leaflet mobility involving all 3 leaflets. There is mild aortic insufficiency. Peak velocity across the aortic valve measured 3.9 m/s corresponding to  estimated mean transvalvular gradient of approximately 40 mmHg. The diameter of the left ventricular outflow tract was measured 1.9 cm. The mitral valve prosthesis appears to be functioning normally. The patient additionally has moderate pulmonary hypertension and moderate tricuspid regurgitation. Cardiac gated CT angiogram of the heart demonstrates anatomical characteristics that appear relatively favorable for transcatheter aortic valve replacement. There is not much annular calcification, there appears to be adequate coronary height and sinotubular dimensions, and the overall size of the aortic annulus appears more than adequate for at least a 26 mm transcatheter valve. CT angiogram of the aorta and iliac vessels demonstrates adequate pelvic arterial access for a transfemoral approach. Of note, CT angiogram also demonstrates the presence of significant portal hypertension with esophageal varices and splenomegaly. Risks associated with conventional surgical aortic valve replacement through a redo median sternotomy would clearly be relatively high. Under the circumstances I feel that transcatheter aortic valve replacement would be a far less risky treatment alternative, although the patient's relatively young age raises questions about the long-term durability of currently available transcatheter heart valves. Because the patient's native aortic valve and aortic annulus is reasonably large in size, questions regarding long-term durability of a bioprosthetic transcatheter heart valve may be potentially less problematic because valve-in-valve redo transcatheter aortic valve replacement could be used in the future if she were to develop late structural valve deterioration and failure.     Plan:  I have again reviewed the relative risks and benefits of both high risk conventional surgical aortic valve replacement via redo median sternotomy and transcatheter aortic valve replacement with the patient and her  close friend in the office today. I believe that transcatheter aortic valve replacement would be associated with less procedural risk and a far less difficult recovery than high risk conventional surgery.  Transcatheter aortic valve replacement might also be associated with an improved hemodynamic result with likely minimal residual transvalvular gradient.  In contrast, conventional surgical aortic valve replacement using a mechanical prosthesis would minimize the potential for late structural valve deterioration and failure and the possible need for repeat intervention in the distant future.  However, the patient's native aortic valve and aortic annulus is large enough to support at least a 26 mm transcatheter heart valve.  Therefore, valve in valve redo transcatheter aortic valve replacement could potentially be used in the future if the patient were ever to develop late structural valve deterioration and failure.  After discussing the relative risks and benefits of each approach at length, the patient desires to proceed with transcatheter aortic valve replacement is no alternative to high-risk conventional surgery in the near future, once she has completely recovered from her dental extraction.  Following the decision to proceed with transcatheter aortic valve replacement, a discussion has been held regarding what types of management strategies would be attempted intraoperatively in the event of life-threatening complications, including whether or not the patient would be considered a candidate for the use of cardiopulmonary bypass and/or conversion to open sternotomy for attempted surgical intervention.  The patient has been  advised of a variety of complications that might develop including but not limited to risks of death, stroke, paravalvular leak, aortic dissection or other major vascular complications, aortic annulus rupture, device embolization, cardiac rupture or perforation, mitral regurgitation, acute  myocardial infarction, arrhythmia, heart block or bradycardia requiring permanent pacemaker placement, congestive heart failure, respiratory failure, renal failure, pneumonia, infection, other late complications related to structural valve deterioration or migration, or other complications that might ultimately cause a temporary or permanent loss of functional independence or other long term morbidity.  The patient provides full informed consent for the procedure as described and all questions were answered.  We tentatively plan to proceed with transcatheter aortic valve replacement via open transfemoral approach on Tuesday, 09/28/2014. The patient will return for follow-up approximately 7-10 days prior to her surgery.  At that time she will undergo a second opinion surgical consultation. Once final plans are in place the patient will be instructed when to stop taking Coumadin and begin bridging therapy using outpatient Lovenox injections. All of her questions have been addressed.   Salvatore Decentlarence H. Cornelius Moraswen, MD 08/17/2014 3:43 PM

## 2014-08-18 ENCOUNTER — Encounter: Payer: Self-pay | Admitting: Cardiology

## 2014-08-18 ENCOUNTER — Other Ambulatory Visit: Payer: Self-pay | Admitting: *Deleted

## 2014-08-18 DIAGNOSIS — I350 Nonrheumatic aortic (valve) stenosis: Secondary | ICD-10-CM

## 2014-08-23 ENCOUNTER — Other Ambulatory Visit (HOSPITAL_COMMUNITY): Payer: Self-pay | Admitting: Internal Medicine

## 2014-08-23 DIAGNOSIS — Z1231 Encounter for screening mammogram for malignant neoplasm of breast: Secondary | ICD-10-CM

## 2014-08-25 ENCOUNTER — Other Ambulatory Visit: Payer: Self-pay | Admitting: Nurse Practitioner

## 2014-09-01 DIAGNOSIS — Z9289 Personal history of other medical treatment: Secondary | ICD-10-CM

## 2014-09-01 HISTORY — DX: Personal history of other medical treatment: Z92.89

## 2014-09-03 ENCOUNTER — Ambulatory Visit (HOSPITAL_COMMUNITY)
Admission: RE | Admit: 2014-09-03 | Discharge: 2014-09-03 | Disposition: A | Discharge status: Home / Self Care | Payer: 59 | Source: Ambulatory Visit | Attending: Internal Medicine | Admitting: Internal Medicine

## 2014-09-03 DIAGNOSIS — Z1231 Encounter for screening mammogram for malignant neoplasm of breast: Secondary | ICD-10-CM | POA: Diagnosis not present

## 2014-09-09 LAB — POCT INR: INR: 4

## 2014-09-20 ENCOUNTER — Encounter (HOSPITAL_COMMUNITY): Payer: Self-pay | Admitting: Dentistry

## 2014-09-20 ENCOUNTER — Ambulatory Visit (HOSPITAL_COMMUNITY): Payer: Medicaid - Dental | Admitting: Dentistry

## 2014-09-20 ENCOUNTER — Encounter (INDEPENDENT_AMBULATORY_CARE_PROVIDER_SITE_OTHER): Payer: Self-pay

## 2014-09-20 VITALS — BP 137/69 | HR 73 | Temp 97.6°F

## 2014-09-20 DIAGNOSIS — K08109 Complete loss of teeth, unspecified cause, unspecified class: Secondary | ICD-10-CM

## 2014-09-20 DIAGNOSIS — K117 Disturbances of salivary secretion: Secondary | ICD-10-CM

## 2014-09-20 DIAGNOSIS — Z01818 Encounter for other preprocedural examination: Secondary | ICD-10-CM

## 2014-09-20 DIAGNOSIS — D696 Thrombocytopenia, unspecified: Secondary | ICD-10-CM

## 2014-09-20 DIAGNOSIS — Z954 Presence of other heart-valve replacement: Secondary | ICD-10-CM

## 2014-09-20 DIAGNOSIS — R682 Dry mouth, unspecified: Secondary | ICD-10-CM

## 2014-09-20 DIAGNOSIS — Z7901 Long term (current) use of anticoagulants: Secondary | ICD-10-CM

## 2014-09-20 DIAGNOSIS — K0820 Unspecified atrophy of edentulous alveolar ridge: Secondary | ICD-10-CM

## 2014-09-20 DIAGNOSIS — I350 Nonrheumatic aortic (valve) stenosis: Secondary | ICD-10-CM

## 2014-09-20 NOTE — Patient Instructions (Signed)
PLAN: 1. Continue chlorhexidine gluconate rinses twice daily as prescribed for the next 2-3 months. 2. Use salt water rinses as needed. 3. Advance diet as tolerated and used nutritional supplements such as Ensure plus or Boost supplements to maintain protein content in her diet. 4. The patient is currently cleared for heart valve surgery at Dr. Orvan July discretion. 5. Follow-up with Dr. Jonnie Finner, prosthodontist, for evaluation for upper and lower complete dentures with or without implants. Release of information was signed today. Suggest follow-up appointment with Dr. Vear Clock in approximately 6 weeks to allow for healing from the anticipated heart valve surgery.   Charlynne Pander, DDS

## 2014-09-20 NOTE — Progress Notes (Signed)
PROGRESS NOTE  09/20/2014   Jamie Howell [id number]  VITALS: BP 137/69 mmHg  Pulse 73  Temp(Src) 97.6 F (36.4 C) (Oral)  LABS:  Lab Results  Component Value Date   WBC 3.3* 08/06/2014   HGB 8.6* 08/06/2014   HCT 26.8* 08/06/2014   MCV 84.8 08/06/2014   PLT 65* 08/06/2014   BMET    Component Value Date/Time   NA 136 08/06/2014 0359   NA 136 06/08/2014 1415   K 4.0 08/06/2014 0359   K 3.7 06/08/2014 1415   CL 100* 08/06/2014 0359   CO2 28 08/06/2014 0359   CO2 28 06/08/2014 1415   GLUCOSE 112* 08/06/2014 0359   GLUCOSE 254* 06/08/2014 1415   BUN 16 08/06/2014 0359   BUN 25.3 06/08/2014 1415   CREATININE 1.06* 08/06/2014 0359   CREATININE 1.2* 06/08/2014 1415   CALCIUM 8.4* 08/06/2014 0359   CALCIUM 8.8 06/08/2014 1415   GFRNONAA 54* 08/06/2014 0359   GFRAA >60 08/06/2014 0359    Lab Results  Component Value Date   INR 1.28 08/06/2014   INR 1.31 08/05/2014   INR 1.39 08/05/2014   No results found for: PTT   Jamie Howell is status post extraction of remaining teeth with alveoloplasty in the operating room on 08/05/2014. Patient was seen for postop evaluation and suture removal on 08/16/2014. Patient is now seen for reevaluation of healing and examination to provide surgical clearance for heart valve surgery.  SUBJECTIVE: Patient denies any problems with healing from the dental extractions. Patient does have some difficulty eating without any teeth.    EXAM: The patient is now edentulous. Healing has progressed well by both primary and secondary closure. 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     ASSESSMENT: Post operative course is consistent with dental procedures performed in the OR with [facility]. The patient is edentulous.  There is atrophy of the edentulous alveolar ridges.  PLAN: 1. Continue chlorhexidine gluconate rinses twice daily as prescribed for the next 2-3 months. 2. Use salt water  rinses as needed. 3. Advance diet as tolerated and used nutritional supplements such as Ensure plus or Boost supplements to maintain protein content in her diet. 4. The patient is currently cleared for heart valve surgery at Dr. Orvan July discretion. 5. Follow-up with Dr. Jonnie Finner, prosthodontist, for evaluation for upper and lower complete dentures with or without implants. Release of information was signed today. Suggest follow-up appointment with Dr. Vear Clock in approximately     6 weeks to allow for healing from the anticipated heart valve surgery.   Charlynne Pander, DDS

## 2014-09-22 ENCOUNTER — Institutional Professional Consult (permissible substitution) (INDEPENDENT_AMBULATORY_CARE_PROVIDER_SITE_OTHER): Payer: 59 | Admitting: Surgery

## 2014-09-22 ENCOUNTER — Encounter: Payer: Self-pay | Admitting: Surgery

## 2014-09-22 VITALS — BP 124/64 | HR 88 | Resp 20 | Ht 59.0 in | Wt 186.0 lb

## 2014-09-22 DIAGNOSIS — I350 Nonrheumatic aortic (valve) stenosis: Secondary | ICD-10-CM | POA: Diagnosis not present

## 2014-09-22 DIAGNOSIS — I059 Rheumatic mitral valve disease, unspecified: Secondary | ICD-10-CM

## 2014-09-22 DIAGNOSIS — I351 Nonrheumatic aortic (valve) insufficiency: Secondary | ICD-10-CM | POA: Diagnosis not present

## 2014-09-22 DIAGNOSIS — I5032 Chronic diastolic (congestive) heart failure: Secondary | ICD-10-CM | POA: Diagnosis not present

## 2014-09-22 DIAGNOSIS — Z954 Presence of other heart-valve replacement: Secondary | ICD-10-CM

## 2014-09-22 NOTE — Progress Notes (Signed)
Patient ID: Jamie Howell, female   DOB: 10/01/1950, 64 y.o.   MRN: [id number]   [facility]  MULTIDISCIPLINARY HEART VALVE CLINIC    CARDIOTHORACIC SURGERY CONSULTATION REPORT  Referring Provider is Tonny Bollman, MD PCP is Londell Moh, MD  Chief Complaint  Patient presents with  . Aortic Stenosis    2nd TAVR eval, surgery scheduled for 09/28/14    HPI:  Patient is a 64 year old woman with a known history of long-standing rheumatic heart disease who underwent mitral valve replacement in 1993 with a St. Jude mechanical valve for severe MS. For the last several years she has been followed by Dr. Mayford Knife. Echocardiograms have demonstrated the presence of stable left ventricular systolic function with normal functioning mechanical mitral valve prosthesis, but the patient has developed progressive aortic stenosis. Over the last several months the patient has developed progressive exertional shortness of breath and increased episodes of intermittent tachypalpitations.Follow-up echocardiogram performed on 05/12/2014 showed severe aortic stenosis with a peak velocity across the aortic valve measured 3.9 m/s corresponding to a mean transvalvular gradient estimated at 40 mmHg. There was mild to moderate left ventricular systolic dysfunction with an ejection fraction estimated 45-50%. The patient was referred to Dr. Excell Seltzer for consultation and underwent left and right heart catheterization on 06/28/2014 which revealed normal coronary arteries with no significant coronary artery disease. There appeared to be normal function of the bileaflet mechanical valve in the mitral position. There was moderate pulmonary hypertension. The patient  recently wore a 30 day cardiac event monitor that demonstrated sinus rhythm with PVCs and atrial tachycardia. There was no documented atrial fibrillation.She underwent multiple extractions by Dr. Kristin Bruins on 08/05/2014. She has not been eating very  well since then.  The patient is single and lives locally in [place] with one of her adult children. She works full time at the Environmental consultant at [employer]. She lives a sedentary lifestyle with no exercise. She describes progressive symptoms of exertional shortness of breath which have developed over the last 3-6 months. She now gets short of breath with relatively low level exertion.She denies any history of resting shortness of breath, PND, orthopnea, or lower extremity edema. She has occasional tachy-palpitations with dizziness but no syncope. She has not experienced exertional chest pain or chest tightness. She does report having an episode of sudden profound fatigue and weakness with dizziness last Sunday while in her house. She was not doing anything exertional.   Past Medical History  Diagnosis Date  . HTN (hypertension)   . Thyrotoxicosis      without mention of goiter or other cause, without mention of thyrotoxic crisis or storm  . Hypothyroidism   . 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 Atrial fibrillation   . Atrial flutter   . Heart disease   . Hematoma   . Subdural hematoma   . Pulmonary HTN     mild  . OA (ocular albinism)   . Aortic valve stenosis     moderate aortic valve stenosis with moderate aortic insufficiency, stable MVR, trivial PR, mild TR, severe LAE, grade II-III diastolic dysfunction,mild pulmonary HTN , EF 50% by echo 12/2012  . 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 Chronic recurrent paroxysmal atrial fibrillation   . Obesity (BMI 30-39.9) 07/08/2014  . Splenomegaly 07/14/2014  . Portal hypertension with  esophageal varices 07/14/2014  . Heart murmur   . Temporary low platelet count     Past Surgical History  Procedure Laterality Date  . Mitral valve replacement  04/08/1991    31mm St Jude mechanical  valve - Dr Andrey Campanile  . Left and right heart catheterization with coronary angiogram N/A 06/28/2014    Procedure: LEFT AND RIGHT HEART CATHETERIZATION WITH CORONARY ANGIOGRAM;  Surgeon: Tonny Bollman, MD;  Location: [facility] CATH LAB;  Service: Cardiovascular;  Laterality: N/A;  . Subdural hematoma evacuation via craniotomy  2000    Dr Dutch Quint  . Cholecystectomy  05-30-90  . Total abdominal hysterectomy  07-23-1994  . Tonsillectomy    . Multiple extractions with alveoloplasty N/A 08/05/2014    Procedure: Extraction of tooth #'s 3,4,5,7,8,9,10,11,12,13,17,20,21,22,23,24,25,26,27, 28,29, and 32 with alveoloplasty;  Surgeon: Charlynne Pander, DDS;  Location: [facility] OR;  Service: Oral Surgery;  Laterality: N/A;    Family History  Problem Relation Age of Onset  . CAD Father   . Hypertension Father   . CAD Mother   . Cirrhosis Brother   . Heart attack Sister     History   Social History  . Marital Status: Single    Spouse Name: N/A  . Number of Children: 2  . Years of Education: N/A   Occupational History  . circulation desk clerk    Social History Main Topics  . Smoking status: Never Smoker   . Smokeless tobacco: Never Used  . Alcohol Use: No  . Drug Use: No  . Sexual Activity: Not on file   Other Topics Concern  . Not on file   Social History Narrative    Current Outpatient Prescriptions  Medication Sig Dispense Refill  . amLODipine (NORVASC) 2.5 MG tablet TAKE ONE TABLET BY MOUTH ONCE DAILY 30 tablet 3  . aspirin EC 81 MG tablet Take 1 tablet (81 mg total) by mouth daily. (Patient taking differently: Take 81 mg by mouth daily with lunch. )    . Calcium Carb-Cholecalciferol (CALCIUM 1000 + D PO) Take 1,000 mg by mouth daily with lunch.     . cyclobenzaprine (FLEXERIL) 10 MG tablet Take 5 mg by mouth at bedtime.     . furosemide (LASIX) 40 MG tablet Take 1 tablet (40 mg total) by mouth 2 (two) times daily. (Patient taking differently: Take 40 mg by mouth 2 (two) times daily. Morning and  lunch time)    . levothyroxine (SYNTHROID, LEVOTHROID) 150 MCG tablet Take 150 mcg by mouth daily before breakfast.    . metoprolol succinate (TOPROL-XL) 50 MG 24 hr tablet TAKE ONE TABLET BY MOUTH ONCE DAILY WITH A MEAL OR IMMEDIATELY FOLLOWING A MEAL. (Patient taking differently: TAKE ONE TABLET BY MOUTH ONCE DAILY AT BEDTIME) 30 tablet 3  . potassium chloride (K-DUR) 10 MEQ tablet Take 10 mEq by mouth daily with lunch.     . warfarin (COUMADIN) 2.5 MG tablet Take 2 tablets (5 mg total) by mouth daily. (Patient taking differently: Take 5 mg by mouth at bedtime. )    . enoxaparin (LOVENOX) 40 MG/0.4ML injection Inject 40 mg into the skin See admin instructions. Inject 40 mg daily on 6/25, 6/26 and 6/27 at 6am.     No current facility-administered medications for this visit.    Allergies  Allergen Reactions  . Adhesive [Tape] Itching    Adhesive tape and backing (pls use elastic bandages with no adhesive)  . Penicillins Rash  . Sulfa Antibiotics Rash  Review of Systems:  General:normal appetite, decreased energy, no weight gain, no weight loss, no fever Cardiac:no chest pain with exertion, no chest pain at rest, + SOB with exertion, no resting SOB, no PND, no orthopnea, + palpitations, + arrhythmia, + atrial fibrillation, no LE edema, + dizzy spells, no syncope Respiratory:+ shortness of breath, no home oxygen, no productive cough, no dry cough, no bronchitis, no wheezing, no hemoptysis, no asthma, no pain with inspiration or cough, no sleep apnea, no CPAP at night GI:no difficulty swallowing, no reflux, no frequent heartburn, no hiatal hernia, no abdominal pain, no constipation, no diarrhea, no hematochezia, no hematemesis, no melena GU:no dysuria, no frequency, no urinary tract infection, no hematuria, no kidney  stones, no kidney disease Vascular:no pain suggestive of claudication, no pain in feet, + leg cramps, + severe varicose veins, no DVT, no non-healing foot ulcer Neuro:no stroke, no TIA's, no seizures, no headaches, no temporary blindness one eye, no slurred speech, no peripheral neuropathy, no chronic pain, no instability of gait, no memory/cognitive dysfunction Musculoskeletal:no arthritis, no joint swelling, no myalgias, no difficulty walking, normal mobility  Skin:no rash, no itching, no skin infections, no pressure sores or ulcerations Psych:no anxiety, no depression, no nervousness, no unusual recent stress Eyes:no blurry vision, no floaters, no recent vision changes, + wears glasses or contacts ENT:no hearing loss, no loose or painful teeth but poor dentition, no dentures, last saw dentist many years ago Hematologic:+ easy bruising, no abnormal bleeding, no clotting disorder, no frequent epistaxis Endocrine:no diabetes, does not check CBG's at home    Physical Exam:   BP 124/64 mmHg  Pulse 88  Resp 20  Ht 4\' 11"  (1.499 m)  Wt 186 lb (84.369 kg)  BMI 37.55 kg/m2  SpO2 98%  General:    well-appearing  HEENT:  Unremarkable , NCAT, PERLA, EOMI, oropharynx clear, gums healing well  Neck:   no JVD, no bruits, no adenopathy or thyromegaly  Chest:   clear to auscultation, symmetrical breath sounds, no wheezes, no rhonchi   CV:   RRR, grade III/VI crescendo/decrescendo murmur heard best at RSB,  no diastolic murmur  Abdomen:  soft, non-tender, no masses or organomegaly  Extremities:  warm, well-perfused, pulses palpable, no LE edema  Rectal/GU  Deferred  Neuro:   Grossly non-focal and  symmetrical throughout  Skin:   Clean and dry, no rashes, no breakdown   Diagnostic Tests:   [facility]            [address]            [place], [place] [address]              [phone]  ------------------------------------------------------------------- Transthoracic Echocardiography  Patient:  Jamie Howell, Jamie Howell MR #:    [id number] Study Date: 05/12/2014 Gender:   F Age:    48 Height:   149.9 cm Weight:   84.8 kg BSA:    1.93 m^2 Pt. Status: Room:  ORDERING   Armanda Magic, MD REFERRING  Armanda Magic, MD ATTENDING  Default, Provider [phone] SONOGRAPHER Aida Raider, RDCS PERFORMING  [facility], Outpatient  cc:  ------------------------------------------------------------------- LV EF: 45% -  50%  ------------------------------------------------------------------- Indications:   I05.9 Mitral Valve Disorder. I35.0 Aortic Valve Disorder.  ------------------------------------------------------------------- History:  PMH: Acquired from the patient and from the patient&'s chart. PMH: Aortic Insufficiency. CHF. Pulmonary Hypertension. History of Rheumatic Mitral Valve. Atrial Fibrillation. Shortness of Breath. Hypothyroidism. Risk factors: Hypertension.  ------------------------------------------------------------------- Study Conclusions  - Left ventricle: The cavity  size was normal. There was moderate concentric hypertrophy. Systolic function was mildly reduced. The estimated ejection fraction was in the range of 45% to 50%. Inferior hypokinesis. The study is not technically sufficient to allow evaluation of LV diastolic function. - Aortic valve: Heavily calcified with restricted leaflet motion. Severe aortic stenosis - peak and mean gradients of 60 mmHG and 40 mmHG, respectivley. Based on an LVOT diameter of 1.9 cm, the calculated AVA is 0.6 cm2. There is  mild to moderate regurgitation. Regurgitation pressure half-time: 292 ms. - Mitral valve: Mechanical tilting bileaflet valve. Peak and mean gradients of 15 and 5 mmHg. Normal leaflet motion, no evidence for obstruction or paravalvular leak. There was mild regurgitation. - Left atrium: Severely dilated at 65 ml/m2. - Right ventricle: The cavity size was mildly dilated. - Right atrium: Moderately dilated at 24 cm2. - Atrial septum: No defect or patent foramen ovale was identified. - Tricuspid valve: There was moderate regurgitation. - Pulmonary arteries: PA peak pressure: 54 mm Hg (S). - Systemic veins: The IVC is <2.1 cm, but does not collapse >50%, suggesting an elevated RA pressure of 8 mmHg.  Impressions:  - Compared to the prior study in 12/2013, there is now inferior hypokinesis with EF around 45%, normally functioning mitral valve prosthesis. There is severe aortic stenosis with heavy calcification and AVA around 0.6 cm2 - mean gradient of 40 mmHg. There is moderate AI and moderate TR, RVSP is 54 mmHg, severe LAE and moderate RAE.  ------------------------------------------------------------------- Labs, prior tests, procedures, and surgery: Status post Mitral Valve Replacment-St Jude Mechanical. Transthoracic echocardiography. M-mode, complete 2D, spectral Doppler, and color Doppler. Birthdate: Patient birthdate: 12/10/1950. Age: Patient is 65 yr old. Sex: Gender: female. BMI: 37.8 kg/m^2. Blood pressure:   150/82 Patient status: Outpatient. Study date: Study date: 05/12/2014. Study time: 08:54 AM. Location: [facility]  -------------------------------------------------------------------  ------------------------------------------------------------------- Left ventricle: The cavity size was normal. There was moderate concentric hypertrophy. Systolic function was mildly reduced. The estimated ejection fraction was in the range of  45% to 50%. Inferior hypokinesis. The study is not technically sufficient to allow evaluation of LV diastolic function.  ------------------------------------------------------------------- Aortic valve: Heavily calcified with restricted leaflet motion. Severe aortic stenosis - peak and mean gradients of 60 mmHG and 40 mmHG, respectivley. Based on an LVOT diameter of 1.9 cm, the calculated AVA is 0.6 cm2. There is mild to moderate regurgitation. Trileaflet. Doppler:   VTI ratio of LVOT to aortic valve: 0.21. Valve area (VTI): 0.59 cm^2. Indexed valve area (VTI): 0.31 cm^2/m^2. Mean velocity ratio of LVOT to aortic valve: 0.21. Valve area (Vmean): 0.61 cm^2. Indexed valve area (Vmean): 0.32 cm^2/m^2.  Mean gradient (S): 35 mm Hg.  ------------------------------------------------------------------- Aorta: Aortic root: The aortic root was normal in size. Ascending aorta: The ascending aorta was normal in size.  ------------------------------------------------------------------- Mitral valve: Mechanical tilting bileaflet valve. Peak and mean gradients of 15 and 5 mmHg. Normal leaflet motion, no evidence for obstruction or paravalvular leak. Doppler: There was mild regurgitation.  Valve area by pressure half-time: 2.97 cm^2. Indexed valve area by pressure half-time: 1.54 cm^2/m^2. Valve area by continuity equation (using LVOT flow): 1.74 cm^2. Indexed valve area by continuity equation (using LVOT flow): 0.9 cm^2/m^2. Mean gradient (D): 5 mm Hg. Peak gradient (D): 14 mm Hg.  ------------------------------------------------------------------- Left atrium: Severely dilated at 65 ml/m2.  ------------------------------------------------------------------- Atrial septum: No defect or patent foramen ovale was identified.  ------------------------------------------------------------------- Right ventricle: The cavity size was mildly dilated. Systolic function was  normal.  -------------------------------------------------------------------  Pulmonic valve:  The valve appears to be grossly normal. Doppler: There was trivial regurgitation.  ------------------------------------------------------------------- Tricuspid valve:  Doppler: There was moderate regurgitation.  ------------------------------------------------------------------- Pulmonary artery:  The main pulmonary artery was normal-sized.  ------------------------------------------------------------------- Right atrium: Moderately dilated at 24 cm2.  ------------------------------------------------------------------- Pericardium: There was no pericardial effusion.  ------------------------------------------------------------------- Systemic veins: The IVC is <2.1 cm, but does not collapse >50%, suggesting an elevated RA pressure of 8 mmHg.  ------------------------------------------------------------------- Measurements  Left ventricle              Value     Reference LV ID, ED, PLAX chordal          49  mm    43 - 52 LV ID, ES, PLAX chordal          36  mm    23 - 38 LV fx shortening, PLAX chordal  (L)   27  %    >=29 LV PW thickness, ED            12  mm    --------- IVS/LV PW ratio, ED            1       <=1.3 Stroke volume, 2D             52  ml    --------- Stroke volume/bsa, 2D           27  ml/m^2  --------- LV e&', lateral              9.08 cm/s   --------- LV E/e&', lateral             20.37     --------- LV e&', medial               6.49 cm/s   --------- LV E/e&', medial              28.51     --------- LV e&', average              7.79 cm/s   --------- LV E/e&', average             23.76     ---------  Ventricular septum             Value     Reference IVS thickness, ED             12  mm    ---------  LVOT                   Value     Reference LVOT ID, S                19  mm    --------- LVOT area                 2.84 cm^2   --------- LVOT mean velocity, S           58.9 cm/s   --------- LVOT VTI, S                18.3 cm    ---------  Aortic valve               Value     Reference Aortic valve mean velocity, S       275  cm/s   --------- Aortic valve VTI, S            87.5 cm    --------- Aortic mean gradient, S  35  mm Hg  --------- VTI ratio, LVOT/AV            0.21      --------- Aortic valve area, VTI          0.59 cm^2   --------- Aortic valve area/bsa, VTI        0.31 cm^2/m^2 --------- Velocity ratio, mean, LVOT/AV       0.21      --------- Aortic valve area, mean velocity     0.61 cm^2   --------- Aortic valve area/bsa, mean        0.32 cm^2/m^2 --------- velocity Aortic regurg pressure half-time     292  ms    ---------  Aorta                   Value     Reference Aortic root ID, ED            33  mm    ---------  Left atrium                Value     Reference LA ID, A-P, ES              59  mm    --------- LA ID/bsa, A-P          (H)   3.06 cm/m^2  <=2.2 LA volume, S               120  ml    --------- LA volume/bsa, S             62.3 ml/m^2  --------- LA volume, ES, 1-p A4C          116  ml    --------- LA volume/bsa, ES, 1-p A4C        60.2 ml/m^2  --------- LA volume, ES, 1-p A2C          123  ml    --------- LA volume/bsa, ES,  1-p A2C        63.9 ml/m^2  ---------  Mitral valve               Value     Reference Mitral E-wave peak velocity        185  cm/s   --------- Mitral mean velocity, D          104  cm/s   --------- Mitral deceleration time         187  ms    150 - 230 Mitral pressure half-time         83  ms    --------- Mitral mean gradient, D          5   mm Hg  --------- Mitral peak gradient, D          14  mm Hg  --------- Mitral E/A ratio, peak          2.8      --------- Mitral valve area, PHT, DP        2.97 cm^2   --------- Mitral valve area/bsa, PHT, DP      1.54 cm^2/m^2 --------- Mitral valve area, LVOT          1.74 cm^2   --------- continuity Mitral valve area/bsa, LVOT        0.9  cm^2/m^2 --------- continuity Mitral annulus VTI, D           29.9 cm    ---------  Pulmonary arteries  Value     Reference PA pressure, S, DP        (H)   54  mm Hg  <=30  Tricuspid valve              Value     Reference Tricuspid regurg peak velocity      351  cm/s   --------- Tricuspid peak RV-RA gradient       49  mm Hg  ---------  Right ventricle              Value     Reference RV s&', lateral, S             16.6 cm/s   ---------  Legend: (L) and (H) mark values outside specified reference range.  ------------------------------------------------------------------- Prepared and Electronically Authenticated by  Zoila Shutter MD 2016-02-10T12:41:45   Cardiac Catheterization Procedure Note  Name: Jamie Howell MRN: [id number] DOB: [birth year]  Procedure: Right Heart Cath, catheter placement for selective angiography, Selective Coronary Angiography, aortic root angiography   Indication:  Valvular heart disease. This 64 year old lady with history of mitral valve disease and previous mechanical mitral valve replacement now has developed progressive exertional dyspnea and is found to have severe aortic stenosis.   Procedural Details: There was an indwelling IV in a right antecubital vein. Using normal sterile technique, the IV was changed out for a 5 Fr brachial sheath over a 0.018 inch wire. The right wrist was then prepped, draped, and anesthetized with 1% lidocaine. Using the modified Seldinger technique a 5/6 French Slender sheath was placed in the right radial artery. Intra-arterial verapamil was administered through the radial artery sheath. IV heparin was administered after a JR4 catheter was advanced into the central aorta. A Swan-Ganz catheter was used for the right heart catheterization. it was difficult to advance the catheter. An angled Glidewire was used. A venogram was performed to assist with advancing the catheter to the heart. Standard protocol was followed for recording of right heart pressures and sampling of oxygen saturations. Fick cardiac output was calculated. Standard Judkins catheters were used for selective coronary angiography anaortic root angiography. There were no immediate procedural complications. The patient was transferred to the post catheterization recovery area for further monitoring.  Procedural Findings: Hemodynamics RA 15 RV 57/15 PA 49/22 with a mean of 30 PCWP A wave 22, V wave 23, mean of 20 LV NOT RECORDED  AO 112/57  Oxygen saturations: PA 67 Ao 97  Cardiac Output (Fick) 5.2  Cardiac Index (Fick) 2.9  Coronary angiography: Coronary dominance: right  Left mainstem:Widely patent with no stenosis.  Left anterior descending (LAthe LAD is widely patent and reaches the LV apex. The diagonal branches are patent with no stenosis.  Left circumflex (LCthe intermediate branch is patent with no stenosis. The AV circumflex is large  and supplies a large first obtuse marginal. There is no stenosis throughout.  Right coronary artery (RCA): large, dominant vessel. There is no obstructive disease present. The PDA and PLA branches are patent.   Left ventriculography: Deferred  Aortic Root Angiography: The aortic valve is severely calcified. There is mild aortic insufficiency. The aortic valve mobility is restricted.   The mechanical bileaflet mitral valve appears to have normal leaflet motion. The mitral annulus is severely calcified.   Estimated Blood Loss: minimal  Final Conclusions:  1. Widely patent coronary arteries 2. Status post mechanical mitral valve replacement with normal motion of the bileaflet mechanical prosthesis by fluoroscopy 3. Severely calcified native aortic valve  with restricted valve leaflet mobility and known severe aortic stenosis by echo criteria 4. Elevated right and left heart intracardiac filling pressures   Recommendations: cardiac surgical evaluation for consideration of redo sternotomy/conventional aortic valve replacement versus TAVR   Tonny Bollman MD, [facility] 06/28/2014, 11:54 AM   ADDENDUM REPORT: 07/14/2014 17:55  CLINICAL DATA: Aortic Stenosis Pre TAVR  EXAM: Cardiac TAVR CT  TECHNIQUE: The patient was scanned on a Philips 256 scanner. A 120 kV retrospective scan was triggered in the descending thoracic aorta at 111 HU's. Gantry rotation speed was 270 msecs and collimation was .9 mm. No beta blockade or nitro were given. The 3D data set was reconstructed in 5% intervals of the R-R cycle. Systolic and diastolic phases were analyzed on a dedicated work station using MPR, MIP and VRT modes. The patient received 80 cc of contrast.  FINDINGS: Aortic Valve: Trileaflet non coronary cusp less calcified. Annulus is more spherical than usual with no calcium.  There is a normal functioning bileaflet mechanical Mitral valve  There is severe biatrial  enlargement  Sinotubular Junction: 2.9 cm  Ascending Thoracic Aorta: 3.4 cm  Descending Thoracic Aorta: 1.9 cm  Sinus of Valsalva Measurements:  Non-coronary: 34 mm  Right -coronary: 32 mm  Left -coronary: 30 mm  Coronary Artery Height above Annulus:  Left Main: 15.8 mm  Right Coronary: 14.8 mm  Virtual Basal Annulus Measurements:  Maximum/Minimum Diameter: 26.7 mm x 23 mm more spherical than usual  Area: 534 mm2  Optimum Fluoroscopic Angle for Delivery: RAO 6 degrees Cranial 0 degrees  IMPRESSION: 1) Calcified trileaflet aortic valve. Spherical non calcified annulus measures 534 mm2 suitable for underinflated 29 mm Sapien 3  2) LVOT not narrow measures 2.5 cm with no anterior mitral leaflet due to MVR  3) Normal appearing bileaflet mechanical MVR  4) Severe biatrial enlargement  5) Suitable coronary height for delivery of valve  6) Optimum Angiographic angle for delivery RAO 6 degrees Cranial 0 degrees  Charlton Haws   Electronically Signed  By: Charlton Haws M.D.  On: 07/14/2014 17:55      Study Result     EXAM: OVER-READ INTERPRETATION CT CHEST  The following report is an over-read performed by radiologist Dr. Irma Newness [facility] on 07/14/2014. This over-read does not include interpretation of cardiac or coronary anatomy or pathology. The coronary CTA interpretation by the cardiologist is attached.  COMPARISON: Portable chest 07/01/2003. This study was performed in conjunction with a routine CTA of the chest, abdomen and pelvis.  FINDINGS: Small mediastinal and hilar lymph nodes are better seen on accompanying chest CTA. There is a small hiatal hernia.  Cardiovascular findings are reported separately.  The lungs demonstrate patchy ground-glass opacities with mosaic attenuation. No endobronchial lesion or confluent airspace opacity identified.  The visualized upper  abdomen is notable for contour irregularity of the liver suspicious for cirrhosis. The spleen appears enlarged.  IMPRESSION: 1. Mosaic attenuation in the lungs, likely related to small airways disease. 2. Nonspecific small mediastinal lymph nodes, not pathologically enlarged. 3. Probable hepatic cirrhosis and portal hypertension manifesting as splenomegaly. 4. Cardiovascular findings are dictated separately. In addition, chest CTA findings dictated separately.  Electronically Signed: By: Carey Bullocks M.D. On: 07/14/2014 12:14     CLINICAL DATA: 64 year old female with severe aortic stenosis. Preprocedural study prior to potential transcatheter aortic valve replacement (TAVR).  EXAM: CT ANGIOGRAPHY CHEST, ABDOMEN AND PELVIS  TECHNIQUE: Multidetector CT imaging through the chest, abdomen and pelvis was performed using the standard protocol  during bolus administration of intravenous contrast. Multiplanar reconstructed images and MIPs were obtained and reviewed to evaluate the vascular anatomy.  CONTRAST: 70mL OMNIPAQUE IOHEXOL 350 MG/ML SOLN  COMPARISON: None.  FINDINGS: CTA CHEST FINDINGS  Mediastinum/Lymph Nodes: There multiple borderline enlarged and mildly enlarged mediastinal lymph nodes, largest of which are in the prevascular nodal station measuring 11 mm in short axis, and in the high right paratracheal nodal station measuring up to 13 mm in short axis. Cardiomegaly with left atrial dilatation. Mechanical mitral valve. Extensive calcifications of the mitral annulus and subvalvular apparatus. Extensive calcifications of the aortic valve. There is no significant pericardial fluid, thickening or pericardial calcification. Esophagus is unremarkable in appearance. Multiple large paraesophageal varices, particularly adjacent to the gastroesophageal junction. No axillary lymphadenopathy.  Lungs/Pleura: Mosaic attenuation throughout the lung  parenchyma suggestive a significant air trapping. No consolidative airspace disease. No pleural effusions. No suspicious appearing pulmonary nodules or masses.  Musculoskeletal/Soft Tissues: Median sternotomy wires. There are no aggressive appearing lytic or blastic lesions noted in the visualized portions of the skeleton.  CTA ABDOMEN AND PELVIS FINDINGS  Hepatobiliary: The liver has a shrunken appearance and nodular contour, compatible with underlying cirrhosis. No definite suspicious cystic or solid hepatic lesion identified. Status post cholecystectomy. No intra or extrahepatic biliary ductal dilatation. Portal vein is mildly dilated measuring up to 15 mm in diameter.  Pancreas: Unremarkable.  Spleen: Spleen is enlarged measuring 15.3 x 7.9 x 18.5 cm (estimated splenic volume of 1,118 mL).  Adrenals/Urinary Tract: Bilateral adrenal glands are normal in appearance. Mild multifocal cortical thinning in the kidneys bilaterally, presumably chronic post infectious or inflammatory scarring.  Stomach/Bowel: The appearance of the stomach is normal. No pathologic dilatation of small bowel or colon. Numerous colonic diverticulae are present, particularly in the sigmoid colon, without surrounding inflammatory changes to suggest an acute diverticulitis at this time. Normal appendix.  Vascular/Lymphatic: Vascular findings and measurements pertinent to potential TAVR procedure, as below. Multiple dilated veins in the upper thighs bilaterally (left greater than right), with the largest varix on the right side immediately anterior to the distal common femoral artery measuring up to 4.0 x 4.4 cm. Multiple borderline enlarged retroperitoneal and mesenteric lymph nodes.  Reproductive: Status post hysterectomy. Ovaries are unremarkable in appearance.  Other: Trace volume of ascites. Small epigastric ventral hernia containing some ascites and omental fat.  Musculoskeletal:  There are no aggressive appearing lytic or blastic lesions noted in the visualized portions of the skeleton.  VASCULAR MEASUREMENTS PERTINENT TO TAVR:  AORTA:  Minimal Aortic Diameter - 13 x 13 mm  Severity of Aortic Calcification - mild  RIGHT PELVIS:  Right Common Iliac Artery -  Minimal Diameter - 9.2 x 8.9 mm  Tortuosity - mild  Calcification - mild  Right External Iliac Artery -  Minimal Diameter - 7.5 x 7.2 mm  Tortuosity - mild  Calcification - none  Right Common Femoral Artery -  Minimal Diameter - 6.2 x 6.7 mm  Tortuosity - mild  Calcification - none  LEFT PELVIS:  Left Common Iliac Artery -  Minimal Diameter - 8.3 x 8.1 mm  Tortuosity - mild  Calcification - mild  Left External Iliac Artery -  Minimal Diameter - 6.5 x 6.7 mm  Tortuosity - mild  Calcification - none  Left Common Femoral Artery -  Minimal Diameter - 6.3 x 6.5 mm  Tortuosity - mild  Calcification - none  Review of the MIP images confirms the above findings.  IMPRESSION: 1. Vascular findings  and measurements pertinent to potential TAVR procedure, as detailed above. This patient does appear to have suitable pelvic arterial access. 2. However, this patient has multiple large venous varices, many of which overlie deep pelvic access vessels. Most notably, the largest venous varix is immediately superficial to the right common femoral vein. 3. Stigmata of cirrhosis with portal hypertension, including large esophageal varices, and splenomegaly. 4. Colonic diverticulosis without findings to suggest acute diverticulitis at this time. 5. Additional incidental findings, as above.   Electronically Signed  By: Trudie Reed M.D.  On: 07/19/2014 14:30           Vitals     Height Weight BMI (Calculated)    4\' 11"  (1.499 m) 188 lb 4.4 oz (85.4 kg) 38.1      Interpretation Summary     CLINICAL DATA: 64 year old  female with severe aortic stenosis. Preprocedural study prior to potential transcatheter aortic valve replacement (TAVR).  EXAM: CT ANGIOGRAPHY CHEST, ABDOMEN AND PELVIS  TECHNIQUE: Multidetector CT imaging through the chest, abdomen and pelvis was performed using the standard protocol during bolus administration of intravenous contrast. Multiplanar reconstructed images and MIPs were obtained and reviewed to evaluate the vascular anatomy.  CONTRAST: 70mL OMNIPAQUE IOHEXOL 350 MG/ML SOLN  COMPARISON: None.  FINDINGS: CTA CHEST FINDINGS  Mediastinum/Lymph Nodes: There multiple borderline enlarged and mildly enlarged mediastinal lymph nodes, largest of which are in the prevascular nodal station measuring 11 mm in short axis, and in the high right paratracheal nodal station measuring up to 13 mm in short axis. Cardiomegaly with left atrial dilatation. Mechanical mitral valve. Extensive calcifications of the mitral annulus and subvalvular apparatus. Extensive calcifications of the aortic valve. There is no significant pericardial fluid, thickening or pericardial calcification. Esophagus is unremarkable in appearance. Multiple large paraesophageal varices, particularly adjacent to the gastroesophageal junction. No axillary lymphadenopathy.  Lungs/Pleura: Mosaic attenuation throughout the lung parenchyma suggestive a significant air trapping. No consolidative airspace disease. No pleural effusions. No suspicious appearing pulmonary nodules or masses.  Musculoskeletal/Soft Tissues: Median sternotomy wires. There are no aggressive appearing lytic or blastic lesions noted in the visualized portions of the skeleton.  CTA ABDOMEN AND PELVIS FINDINGS  Hepatobiliary: The liver has a shrunken appearance and nodular contour, compatible with underlying cirrhosis. No definite suspicious cystic or solid hepatic lesion identified. Status post cholecystectomy. No intra or  extrahepatic biliary ductal dilatation. Portal vein is mildly dilated measuring up to 15 mm in diameter.  Pancreas: Unremarkable.  Spleen: Spleen is enlarged measuring 15.3 x 7.9 x 18.5 cm (estimated splenic volume of 1,118 mL).  Adrenals/Urinary Tract: Bilateral adrenal glands are normal in appearance. Mild multifocal cortical thinning in the kidneys bilaterally, presumably chronic post infectious or inflammatory scarring.  Stomach/Bowel: The appearance of the stomach is normal. No pathologic dilatation of small bowel or colon. Numerous colonic diverticulae are present, particularly in the sigmoid colon, without surrounding inflammatory changes to suggest an acute diverticulitis at this time. Normal appendix.  Vascular/Lymphatic: Vascular findings and measurements pertinent to potential TAVR procedure, as below. Multiple dilated veins in the upper thighs bilaterally (left greater than right), with the largest varix on the right side immediately anterior to the distal common femoral artery measuring up to 4.0 x 4.4 cm. Multiple borderline enlarged retroperitoneal and mesenteric lymph nodes.  Reproductive: Status post hysterectomy. Ovaries are unremarkable in appearance.  Other: Trace volume of ascites. Small epigastric ventral hernia containing some ascites and omental fat.  Musculoskeletal: There are no aggressive appearing lytic or blastic lesions noted in  the visualized portions of the skeleton.  VASCULAR MEASUREMENTS PERTINENT TO TAVR:  AORTA:  Minimal Aortic Diameter - 13 x 13 mm  Severity of Aortic Calcification - mild  RIGHT PELVIS:  Right Common Iliac Artery -  Minimal Diameter - 9.2 x 8.9 mm  Tortuosity - mild  Calcification - mild  Right External Iliac Artery -  Minimal Diameter - 7.5 x 7.2 mm  Tortuosity - mild  Calcification - none  Right Common Femoral Artery -  Minimal Diameter - 6.2 x 6.7 mm  Tortuosity -  mild  Calcification - none  LEFT PELVIS:  Left Common Iliac Artery -  Minimal Diameter - 8.3 x 8.1 mm  Tortuosity - mild  Calcification - mild  Left External Iliac Artery -  Minimal Diameter - 6.5 x 6.7 mm  Tortuosity - mild  Calcification - none  Left Common Femoral Artery -  Minimal Diameter - 6.3 x 6.5 mm  Tortuosity - mild  Calcification - none  Review of the MIP images confirms the above findings.  IMPRESSION: 1. Vascular findings and measurements pertinent to potential TAVR procedure, as detailed above. This patient does appear to have suitable pelvic arterial access. 2. However, this patient has multiple large venous varices, many of which overlie deep pelvic access vessels. Most notably, the largest venous varix is immediately superficial to the right common femoral vein. 3. Stigmata of cirrhosis with portal hypertension, including large esophageal varices, and splenomegaly. 4. Colonic diverticulosis without findings to suggest acute diverticulitis at this time. 5. Additional incidental findings, as above.   Electronically Signed  By: Trudie Reed M.D.  On: 07/19/2014 14:30       Impression:   I have personally reviewed her echo, cardiac cath, and CT scans.She has stage D severe symptomatic aortic stenosis presenting with a several month history of worsening symptoms of exertional shortness of breath and fatigue consistent with chronic diastolic congestive heart failure,  New [place] Heart Association class 3. I agree that the risk of conventional open surgical AVR would be very high given her previous MVR surgery, evidence of cirhosis with significant portal hypertension with large esophageal varices and  Splenomegaly. 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 Cardiac gated CT angiogram of the heart  shows that she is a candidate for a 26 mm Sapien 3 valve. There is not much annular calcification, appears to be adequate coronary height and  sinotubular dimensions. CT angiogram of the aorta and iliac vessels demonstrates adequate pelvic arterial access for a transfemoral approach although she has multiple large venous varices overlying the pelvic vessels, the largest of which is over the right common femoral artery and may make transfemoral access difficult. Transaortic access would be a possibility if necessary.    Plan:  TAVR on 09/28/2014    Alleen Borne, MD 09/22/2014

## 2014-09-24 ENCOUNTER — Ambulatory Visit (HOSPITAL_COMMUNITY): Admission: RE | Admit: 2014-09-24 | Payer: 59 | Source: Ambulatory Visit

## 2014-09-24 ENCOUNTER — Encounter (HOSPITAL_COMMUNITY)
Admission: RE | Admit: 2014-09-24 | Discharge: 2014-09-24 | Disposition: A | Discharge status: Home / Self Care | Payer: 59 | Source: Ambulatory Visit | Attending: Pediatrics | Admitting: Pediatrics

## 2014-09-24 ENCOUNTER — Ambulatory Visit (HOSPITAL_COMMUNITY)
Admission: RE | Admit: 2014-09-24 | Discharge: 2014-09-24 | Disposition: A | Discharge status: Home / Self Care | Payer: 59 | Source: Ambulatory Visit | Attending: Cardiovascular Disease | Admitting: Cardiovascular Disease

## 2014-09-24 ENCOUNTER — Encounter (HOSPITAL_COMMUNITY): Payer: Self-pay

## 2014-09-24 VITALS — BP 136/68 | HR 76 | Temp 97.6°F | Resp 20 | Ht 59.0 in | Wt 187.4 lb

## 2014-09-24 DIAGNOSIS — I350 Nonrheumatic aortic (valve) stenosis: Secondary | ICD-10-CM

## 2014-09-24 DIAGNOSIS — Z01812 Encounter for preprocedural laboratory examination: Secondary | ICD-10-CM | POA: Diagnosis not present

## 2014-09-24 DIAGNOSIS — Z01818 Encounter for other preprocedural examination: Secondary | ICD-10-CM | POA: Insufficient documentation

## 2014-09-24 DIAGNOSIS — I517 Cardiomegaly: Secondary | ICD-10-CM | POA: Insufficient documentation

## 2014-09-24 DIAGNOSIS — I493 Ventricular premature depolarization: Secondary | ICD-10-CM | POA: Diagnosis not present

## 2014-09-24 HISTORY — DX: Pneumonia, unspecified organism: J18.9

## 2014-09-24 LAB — COMPREHENSIVE METABOLIC PANEL
ALBUMIN: 3.5 g/dL (ref 3.5–5.0)
ALK PHOS: 97 U/L (ref 38–126)
ALT: 23 U/L (ref 14–54)
ANION GAP: 9 (ref 5–15)
AST: 38 U/L (ref 15–41)
BUN: 18 mg/dL (ref 6–20)
CO2: 23 mmol/L (ref 22–32)
Calcium: 8.7 mg/dL — ABNORMAL LOW (ref 8.9–10.3)
Chloride: 104 mmol/L (ref 101–111)
Creatinine, Ser: 0.8 mg/dL (ref 0.44–1.00)
GFR calc Af Amer: >60 mL/min (ref >60)
GFR calc non Af Amer: >60 mL/min (ref >60)
Glucose, Bld: 150 mg/dL — ABNORMAL HIGH (ref 65–99)
Potassium: 3.9 mmol/L (ref 3.5–5.1)
Sodium: 136 mmol/L (ref 135–145)
Total Bilirubin: 2.2 mg/dL — ABNORMAL HIGH (ref 0.3–1.2)
Total Protein: 7 g/dL (ref 6.5–8.1)

## 2014-09-24 LAB — CBC
HCT: 30.4 % — ABNORMAL LOW (ref 36.0–46.0)
Hemoglobin: 9.8 g/dL — ABNORMAL LOW (ref 12.0–15.0)
MCH: 27.4 pg (ref 26.0–34.0)
MCHC: 32.2 g/dL (ref 30.0–36.0)
MCV: 84.9 fL (ref 78.0–100.0)
Platelets: 59 10*3/uL — ABNORMAL LOW (ref 150–400)
RBC: 3.58 MIL/uL — AB (ref 3.87–5.11)
RDW: 17.4 % — ABNORMAL HIGH (ref 11.5–15.5)
WBC: 3.6 10*3/uL — ABNORMAL LOW (ref 4.0–10.5)

## 2014-09-24 LAB — BLOOD GAS, ARTERIAL
Acid-base deficit: 0.9 mmol/L (ref 0.0–2.0)
Bicarbonate: 22.4 mEq/L (ref 20.0–24.0)
Drawn by: 428831
FIO2: 0.21 %
O2 Saturation: 100 %
PATIENT TEMPERATURE: 98.6
TCO2: 23.3 mmol/L (ref 0–100)
pCO2 arterial: 31.4 mmHg — ABNORMAL LOW (ref 35.0–45.0)
pH, Arterial: 7.467 — ABNORMAL HIGH (ref 7.350–7.450)
pO2, Arterial: 152 mmHg — ABNORMAL HIGH (ref 80.0–100.0)

## 2014-09-24 LAB — URINALYSIS, ROUTINE W REFLEX MICROSCOPIC
Bilirubin Urine: NEGATIVE
Glucose, UA: NEGATIVE mg/dL
Hgb urine dipstick: NEGATIVE
Ketones, ur: NEGATIVE mg/dL
Leukocytes, UA: NEGATIVE
NITRITE: NEGATIVE
PROTEIN: NEGATIVE mg/dL
SPECIFIC GRAVITY, URINE: 1.005 (ref 1.005–1.030)
UROBILINOGEN UA: 0.2 mg/dL (ref 0.0–1.0)
pH: 6.5 (ref 5.0–8.0)

## 2014-09-24 LAB — SURGICAL PCR SCREEN
MRSA, PCR: NEGATIVE
Staphylococcus aureus: POSITIVE — AB

## 2014-09-24 NOTE — Progress Notes (Signed)
SPOKE WITH Jamie Howell AT OFFICE AND SHE STATED PATIENT SHOULD CONTINUE ASPIRIN BUT JUST NOT TAKE ON DOS.

## 2014-09-24 NOTE — Pre-Procedure Instructions (Signed)
SHAMAREE EYNON  09/24/2014      [facility] - [place], [place] - [address] [place] [address] Phone: [phone] Fax: [phone]    Your procedure is scheduled on Tuesday, June 28th   Report to [facility] Admitting at 5:30 AM  Call this number if you have problems the morning of surgery:  [phone]   Remember:  Do not eat food or drink liquids after midnight Monday.  Take these medicines the morning of surgery with A SIP OF WATER : Metoprolol, Levothyroxine, Norvasc   Do not wear jewelry, make-up or nail polish.  Do not wear lotions, powders, or perfumes.  You may NOT wear deodorant the day of surgery.  Do not shave underarms & legs 48 hours prior to surgery.     Do not bring valuables to the hospital.  [facility] is not responsible for any belongings or valuables.  Contacts, dentures or bridgework may not be worn into surgery.  Leave your suitcase in the car.  After surgery it may be brought to your room. For patients admitted to the hospital, discharge time will be determined by your treatment team.  Name and phone number of your driver:     Special instructions:  "Preparing for Surgery" instruction sheet.  Please read over the following fact sheets that you were given. Pain Booklet, Coughing and Deep Breathing, Blood Transfusion Information, MRSA Information and Surgical Site Infection Prevention

## 2014-09-24 NOTE — Progress Notes (Signed)
   09/24/14 1012  OBSTRUCTIVE SLEEP APNEA  Have you ever been diagnosed with sleep apnea through a sleep study? No  Do you snore loudly (loud enough to be heard through closed doors)?  0  Do you often feel tired, fatigued, or sleepy during the daytime? 1  Has anyone observed you stop breathing during your sleep? 0  Do you have, or are you being treated for high blood pressure? 1  BMI more than 35 kg/m2? 1  Age over 64 years old? 1  Neck circumference greater than 40 cm/16 inches? 0  Gender: 0

## 2014-09-27 MED ORDER — NOREPINEPHRINE BITARTRATE 1 MG/ML IV SOLN
0.0000 ug/min | INTRAVENOUS | Status: DC
  Filled 2014-09-27: qty 4

## 2014-09-27 MED ORDER — DOPAMINE-DEXTROSE 3.2-5 MG/ML-% IV SOLN
0.0000 ug/kg/min | INTRAVENOUS | Status: DC
  Filled 2014-09-27: qty 250

## 2014-09-27 MED ORDER — VANCOMYCIN HCL 10 G IV SOLR
1500.0000 mg | INTRAVENOUS | Status: AC
  Administered 2014-09-28: 1500 mg via INTRAVENOUS
  Filled 2014-09-27: qty 1500

## 2014-09-27 MED ORDER — SODIUM CHLORIDE 0.9 % IV SOLN
INTRAVENOUS | Status: DC
  Filled 2014-09-27: qty 30

## 2014-09-27 MED ORDER — SODIUM CHLORIDE 0.9 % IV SOLN: INTRAVENOUS | Status: DC

## 2014-09-27 MED ORDER — EPINEPHRINE HCL 1 MG/ML IJ SOLN
0.0000 ug/min | INTRAVENOUS | Status: DC
  Filled 2014-09-27: qty 4

## 2014-09-27 MED ORDER — POTASSIUM CHLORIDE 2 MEQ/ML IV SOLN
80.0000 meq | INTRAVENOUS | Status: DC
  Filled 2014-09-27: qty 40

## 2014-09-27 MED ORDER — SODIUM CHLORIDE 0.9 % IV SOLN
INTRAVENOUS | Status: DC
  Filled 2014-09-27: qty 2.5

## 2014-09-27 MED ORDER — DEXMEDETOMIDINE HCL IN NACL 400 MCG/100ML IV SOLN
0.1000 ug/kg/h | INTRAVENOUS | Status: DC
  Filled 2014-09-27: qty 100

## 2014-09-27 MED ORDER — PHENYLEPHRINE HCL 10 MG/ML IJ SOLN
30.0000 ug/min | INTRAVENOUS | Status: DC
  Filled 2014-09-27: qty 2

## 2014-09-27 MED ORDER — METOPROLOL TARTRATE 12.5 MG HALF TABLET: 12.5000 mg | ORAL_TABLET | ORAL | Status: DC

## 2014-09-27 MED ORDER — NITROGLYCERIN IN D5W 200-5 MCG/ML-% IV SOLN
2.0000 ug/min | INTRAVENOUS | Status: DC
  Filled 2014-09-27: qty 250

## 2014-09-27 MED ORDER — CEFUROXIME SODIUM 1.5 G IJ SOLR
1.5000 g | INTRAMUSCULAR | Status: AC
  Administered 2014-09-28: 1.5 g via INTRAVENOUS
  Filled 2014-09-27: qty 1.5

## 2014-09-27 MED ORDER — MAGNESIUM SULFATE 50 % IJ SOLN
40.0000 meq | INTRAMUSCULAR | Status: DC
  Filled 2014-09-27: qty 10

## 2014-09-27 NOTE — Progress Notes (Signed)
Anesthesia Chart Review:  Patient is a 64 year old female scheduled for TAVR, transfemoral approach on 09/28/14 by Dr. Burt Knack.  She is s/p multiple dental extractions on 08/05/14.  See anesthesia note from [place], FNP-BC from 08/03/14 that outlines PMHX and recent cardiac studies.  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 Burr Medico), evidence of cirrhosis with portal hypertension and esophageal varices. She has been on Coumadin for history of [employer] Jude MVR '93. She was referred to hematologist Dr. Burr Medico back in 06/2014 prior to scheduling TAVR. His note indicates that she was noted to be thrombocytopenic ~ 2011 and was evaluated by his partner Dr. Marko Plume and had an "extensive workup including a bone marrow biopsy, which showed hypercellular bone marrow with normal cytogenetics, ultrasound of abdomen showed enlarged spleen. 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 She did not require any specific treatment for her thrombocytopenia, and did not follow-up with [place]." On 06/08/14, Dr. Burr Medico felt her PLT count was probably adequate for aortic surgery, but she may require platelet transfusion.  He discussed the possibility of splenectomy after her cardiac surgery if her PLT count dropped significantly below 50K. He was planning to see her in F/U after heart surgery.  PCP is Dr. Deland Pretty. Cardiologist is Dr. Burt Knack. Coumadin on hold since 09/22/14.   Preoperative EKG and CXR noted. 08/03/14 PFTs show FVC 1.49 (57%), FEV1 1.27 (64%).  Preoperative labs noted. Cr 0.80. AST/ALT WNL. Total bilirubin 2.2 (previously 2.6). H/H 9.8/30.4, PLT count 59K. She was negative for Hepatitis B, C and HIV on 06/08/14. She is for T&S, PT/PTT on the day of surgery (A1C may need to be repeated, as result still not resulted after 3 days).  Patient received PLT in 08/2014, so Blood Bank stated T&S needed to be done on the day of surgery.  I will add T&C. Her CBC results are chronically abnormal--findings are not unexpected.  Dr. Burt Knack already had patient evaluated by hematology pre-operatively. I have called CBC results to [facility] who will review with MD.  Patient will likely need perioperative PLT transfusion and possibly PRBCs.  Defer other orders to cardiology/CT surgery and/or anesthesiology.   George Hugh [facility] Short Stay Center/Anesthesiology Phone [phone] 09/27/2014 10:38 AM

## 2014-09-27 NOTE — H&P (Signed)
[address]       Jamie Howell [address]             [phone]          CARDIOTHORACIC SURGERY HISTORY AND PHYSICAL EXAM  Referring Provider is Jamie Bollman, MD PCP is Jamie Moh, MD  Chief Complaint  Patient presents with  . Aortic Stenosis    Surgical eval for possible TAVR v/s AVR, Cardiac Cath 06/28/14    HPI:  Patient is 64 year old obese white female from [place] with known history of long-standing rheumatic heart disease who has been referred to discuss treatment options for management of severe symptomatic aortic stenosis. The patient states that she was hospitalized for more than 2 weeks when she was 64 years old with a prolonged febrile illness. She was not diagnosed with rheumatic fever at the time, but she later was found to have a heart murmur and told that she had likely experienced rheumatic fever in the past. She developed symptoms of congestive heart failure and atrial fibrillation and eventually underwent mitral valve replacement using a St. Jude bileaflet mechanical prosthesis in 1993 for severe mitral stenosis. Her surgical procedure was uncomplicated and she has done well from a cardiac standpoint until recently. Approximately 16 years ago she developed an intracranial hemorrhage related to anticoagulation with warfarin for which she required burr hole craniotomy for decompression. For the last several years she has been followed by Jamie Howell. Echocardiograms have demonstrated the presence of stable left ventricular systolic function with normal functioning mechanical mitral valve prosthesis, but the patient has developed progressive aortic stenosis. Over the last several months the patient has developed progressive exertional shortness of breath and increased episodes of intermittent tachypalpitations. Follow-up echocardiogram performed 05/12/2014 revealed the presence of severe aortic stenosis with peak velocity across  the aortic valve measured 3.9 m/s corresponding to a mean transvalvular gradient estimated 40 mmHg. There was mild to moderate left ventricular systolic dysfunction with ejection fraction estimated 45-50%.The patient was referred to Jamie Howell for consultation and underwent left and right heart catheterization on 06/28/2014 which revealed normal coronary arteries with no significant coronary artery disease. There appeared to be normal function of the bileaflet mechanical valve in the mitral position. There was moderate pulmonary hypertension. The patient additionally recently wore a 30 day cardiac event monitor that demonstrated sinus rhythm with PVCs and atrial tachycardia. There was no documented atrial fibrillation. The patient was referred for surgical consultation.  The patient is single and lives locally in [place] with one of her adult children. She works full time at the [employer] at [employer]. She lives a sedentary lifestyle. She describes progressive symptoms of exertional shortness of breath which have developed over the last 3-6 months. She now gets short of breath with relatively low level exertion. She denies any history of resting shortness of breath, PND, orthopnea, or lower extremity edema. She has occasional tachycardia palpitations with dizziness but no syncope. She has not experienced exertional chest pain or chest tightness, although she occasionally gets very transient episodes of atypical chest pain in the left chest.       Patient returns to the office today for follow-up of severe symptomatic aortic stenosis with underlying long-standing rheumatic heart disease and chronic diastolic congestive heart failure. She was originally seen in consultation on 07/08/2014 and more recently on 07/26/2014. Since then she underwent dental extraction by Jamie Howell. She returns to the office for follow-up today with hopes to  make definitive plans for surgical intervention in  the near future. She is still recovering from her dental extraction, which she said was reportedly very challenging. However, she has not had any complications. She still feels quite tired and does not feel up to going back to work. She reports no changes in her chronic symptoms of exertional shortness of breath and fatigue. The remainder of her review of systems is unchanged.   Past Medical History  Diagnosis Date  . HTN (hypertension)   . Thyrotoxicosis      without mention of goiter or other cause, without mention of thyrotoxic crisis or storm  . Hypothyroidism   . 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 Atrial fibrillation   . Atrial flutter   . Heart disease   . Hematoma   . Subdural hematoma   . Pulmonary HTN     mild  . OA (ocular albinism)   . Aortic valve stenosis     moderate aortic valve stenosis with moderate aortic insufficiency, stable MVR, trivial PR, mild TR, severe LAE, grade II-III diastolic dysfunction,mild pulmonary HTN , EF 50% by echo 12/2012  . 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 Chronic recurrent paroxysmal atrial fibrillation   . Obesity (BMI 30-39.9) 07/08/2014  . Splenomegaly 07/14/2014  . Portal hypertension with esophageal varices 07/14/2014  . Heart murmur   . Temporary low platelet count   . Pneumonia     HX 1 + YR AGO     Past Surgical History  Procedure Laterality Date  . Mitral valve replacement  04/08/1991    31mm St Jude mechanical valve - Jamie Howell  . Left and right heart catheterization with coronary angiogram N/A 06/28/2014    Procedure: LEFT AND RIGHT HEART CATHETERIZATION WITH CORONARY ANGIOGRAM;  Surgeon: Jamie Bollman, MD;  Location: [facility] CATH LAB;  Service: Cardiovascular;  Laterality: N/A;  . Subdural hematoma evacuation via craniotomy  2000    Jamie Howell  . Cholecystectomy  05-30-90  . Total  abdominal hysterectomy  07-23-1994  . Tonsillectomy    . Multiple extractions with alveoloplasty N/A 08/05/2014    Procedure: Extraction of tooth #'s 3,4,5,7,8,9,10,11,12,13,17,20,21,22,23,24,25,26,27, 28,29, and 32 with alveoloplasty;  Surgeon: Charlynne Pander, DDS;  Location: [facility] OR;  Service: Oral Surgery;  Laterality: N/A;  . Cardiac catheterization      06/2014    Family History  Problem Relation Age of Onset  . CAD Father   . Hypertension Father   . CAD Mother   . Cirrhosis Brother   . Heart attack Sister     Social History History  Substance Use Topics  . Smoking status: Never Smoker   . Smokeless tobacco: Never Used  . Alcohol Use: No    Prior to Admission medications   Medication Sig Start Date End Date Taking? Authorizing Provider  amLODipine (NORVASC) 2.5 MG tablet TAKE ONE TABLET BY MOUTH ONCE DAILY 08/11/14  Yes Quintella Reichert, MD  aspirin EC 81 MG tablet Take 1 tablet (81 mg total) by mouth daily. Patient taking differently: Take 81 mg by mouth daily with lunch.  12/10/13  Yes Quintella Reichert, MD  Calcium Carb-Cholecalciferol (CALCIUM 1000 + D PO) Take 1,000 mg by mouth daily with lunch.    Yes Historical Provider, MD  cyclobenzaprine (FLEXERIL) 10 MG tablet Take 5 mg by mouth  at bedtime.    Yes Historical Provider, MD  enoxaparin (LOVENOX) 40 MG/0.4ML injection Inject 40 mg into the skin See admin instructions. Inject 40 mg daily on 6/25, 6/26 and 6/27 at 6am. 07/27/14  Yes Historical Provider, MD  furosemide (LASIX) 40 MG tablet Take 1 tablet (40 mg total) by mouth 2 (two) times daily. Patient taking differently: Take 40 mg by mouth 2 (two) times daily. Morning and lunch time 05/10/14  Yes Quintella Reichert, MD  levothyroxine (SYNTHROID, LEVOTHROID) 150 MCG tablet Take 150 mcg by mouth daily before breakfast.   Yes Historical Provider, MD  metoprolol succinate (TOPROL-XL) 50 MG 24 hr tablet TAKE ONE TABLET BY MOUTH ONCE DAILY WITH A MEAL OR IMMEDIATELY FOLLOWING A  MEAL. Patient taking differently: TAKE ONE TABLET BY MOUTH ONCE DAILY AT BEDTIME 08/11/14  Yes Quintella Reichert, MD  potassium chloride (K-DUR) 10 MEQ tablet Take 10 mEq by mouth daily with lunch.    Yes Historical Provider, MD  warfarin (COUMADIN) 2.5 MG tablet Take 2 tablets (5 mg total) by mouth daily. Patient taking differently: Take 5 mg by mouth at bedtime.  08/06/14   Ok Anis, NP    Allergies  Allergen Reactions  . Adhesive [Tape] Itching    Adhesive tape and backing (pls use elastic bandages with no adhesive)  . Penicillins Rash  . Sulfa Antibiotics Rash      Review of Systems:  General:normal appetite, decreased energy, no weight gain, no weight loss, no fever Cardiac:no chest pain with exertion, no chest pain at rest, + SOB with exertion, no resting SOB, no PND, no orthopnea, + palpitations, + arrhythmia, + atrial fibrillation, no LE edema, + dizzy spells, no syncope Respiratory:+ shortness of breath, no home oxygen, no productive cough, no dry cough, no bronchitis, no wheezing, no hemoptysis, no asthma, no pain with inspiration or cough, no sleep apnea, no CPAP at night GI:no difficulty swallowing, no reflux, no frequent heartburn, no hiatal hernia, no abdominal pain, no constipation, no diarrhea, no hematochezia, no hematemesis, no melena GU:no dysuria, no frequency, no urinary tract infection, no hematuria, no kidney stones, no kidney disease Vascular:no pain suggestive of claudication, no pain in feet, + leg cramps, + severe varicose veins, no DVT, no non-healing foot ulcer Neuro:no stroke, no TIA's, no seizures, no headaches, no temporary blindness one eye, no slurred speech, no peripheral neuropathy, no chronic pain, no  instability of gait, no memory/cognitive dysfunction Musculoskeletal:no arthritis, no joint swelling, no myalgias, no difficulty walking, normal mobility  Skin:no rash, no itching, no skin infections, no pressure sores or ulcerations Psych:no anxiety, no depression, no nervousness, no unusual recent stress Eyes:no blurry vision, no floaters, no recent vision changes, + wears glasses or contacts ENT:no hearing loss, no loose or painful teeth but poor dentition, no dentures, last saw dentist many years ago Hematologic:+ easy bruising, no abnormal bleeding, no clotting disorder, no frequent epistaxis Endocrine:no diabetes, does not check CBG's at home   Physical Exam:  BP 124/68 mmHg  Pulse 79  Resp 20  Ht 4\' 11"  (1.499 m)  Wt 184 lb (83.462 kg)  BMI 37.14 kg/m2  SpO2 93% General:Obese but o/w well-appearing HEENT:Unremarkable  Neck:no JVD, no bruits, no adenopathy  Chest:clear to auscultation, symmetrical breath sounds, no wheezes, no rhonchi  CV:RRR, grade III/VI systolic murmur  Abdomen:soft, non-tender, no masses  Extremities:warm, well-perfused, pulses diminished, no LE edema, severe varicose veins with moderate-severe chronic venous insufficiency Rectal/GUDeferred Neuro:Grossly non-focal and symmetrical throughout Skin:Clean and dry,  no rashes, no breakdown, +  chronic skin changes both lower legs related to venous insufficiency     Diagnostic Tests:  Transthoracic Echocardiography  Patient:  Jamie Howell, Jamie Howell MR #:    [id number] Study Date: 05/12/2014 Gender:   F Age:    31 Height:   149.9 cm Weight:   84.8 kg BSA:    1.93 m^2 Pt. Status: Room:  ORDERING   Armanda Magic, MD REFERRING  Armanda Magic, MD ATTENDING  Default, Provider [phone] SONOGRAPHER Aida Raider, RDCS PERFORMING  [facility], Outpatient  cc:  ------------------------------------------------------------------- LV EF: 45% -  50%  ------------------------------------------------------------------- Indications:   I05.9 Mitral Valve Disorder. I35.0 Aortic Valve Disorder.  ------------------------------------------------------------------- History:  PMH: Acquired from the patient and from the patient&'s chart. PMH: Aortic Insufficiency. CHF. Pulmonary Hypertension. History of Rheumatic Mitral Valve. Atrial Fibrillation. Shortness of Breath. Hypothyroidism. Risk factors: Hypertension.  ------------------------------------------------------------------- Study Conclusions  - Left ventricle: The cavity size was normal. There was moderate concentric hypertrophy. Systolic function was mildly reduced. The estimated ejection fraction was in the range of 45% to 50%. Inferior hypokinesis. The study is not technically sufficient to allow evaluation of LV diastolic function. - Aortic valve: Heavily calcified with restricted leaflet motion. Severe aortic stenosis - peak and mean gradients of 60 mmHG and 40 mmHG, respectivley. Based on an LVOT diameter of 1.9 cm, the calculated AVA is 0.6 cm2. There is mild to moderate regurgitation. Regurgitation pressure half-time: 292 ms. - Mitral valve: Mechanical tilting bileaflet valve. Peak and mean gradients of 15 and 5 mmHg. Normal leaflet motion, no evidence for  obstruction or paravalvular leak. There was mild regurgitation. - Left atrium: Severely dilated at 65 ml/m2. - Right ventricle: The cavity size was mildly dilated. - Right atrium: Moderately dilated at 24 cm2. - Atrial septum: No defect or patent foramen ovale was identified. - Tricuspid valve: There was moderate regurgitation. - Pulmonary arteries: PA peak pressure: 54 mm Hg (S). - Systemic veins: The IVC is <2.1 cm, but does not collapse >50%, suggesting an elevated RA pressure of 8 mmHg.  Impressions:  - Compared to the prior study in 12/2013, there is now inferior hypokinesis with EF around 45%, normally functioning mitral valve prosthesis. There is severe aortic stenosis with heavy calcification and AVA around 0.6 cm2 - mean gradient of 40 mmHg. There is moderate AI and moderate TR, RVSP is 54 mmHg, severe LAE and moderate RAE.  ------------------------------------------------------------------- Labs, prior tests, procedures, and surgery: Status post Mitral Valve Replacment-St Jude Mechanical. Transthoracic echocardiography. M-mode, complete 2D, spectral Doppler, and color Doppler. Birthdate: Patient birthdate: [birth year]. Age: Patient is 64 yr old. Sex: Gender: female. BMI: 37.8 kg/m^2. Blood pressure:   150/82 Patient status: Outpatient. Study date: Study date: 05/12/2014. Study time: 08:54 AM. Location: [facility]  -------------------------------------------------------------------  ------------------------------------------------------------------- Left ventricle: The cavity size was normal. There was moderate concentric hypertrophy. Systolic function was mildly reduced. The estimated ejection fraction was in the range of 45% to 50%. Inferior hypokinesis. The study is not technically sufficient to allow evaluation of LV diastolic function.  ------------------------------------------------------------------- Aortic valve: Heavily  calcified with restricted leaflet motion. Severe aortic stenosis - peak and mean gradients of 60 mmHG and 40 mmHG, respectivley. Based on an LVOT diameter of 1.9 cm, the calculated AVA is 0.6 cm2. There is mild to moderate regurgitation. Trileaflet. Doppler:   VTI ratio of LVOT to aortic valve: 0.21. Valve area (VTI): 0.59 cm^2. Indexed valve area (VTI): 0.31 cm^2/m^2. Mean velocity ratio of LVOT to  aortic valve: 0.21. Valve area (Vmean): 0.61 cm^2. Indexed valve area (Vmean): 0.32 cm^2/m^2.  Mean gradient (S): 35 mm Hg.  ------------------------------------------------------------------- Aorta: Aortic root: The aortic root was normal in size. Ascending aorta: The ascending aorta was normal in size.  ------------------------------------------------------------------- Mitral valve: Mechanical tilting bileaflet valve. Peak and mean gradients of 15 and 5 mmHg. Normal leaflet motion, no evidence for obstruction or paravalvular leak. Doppler: There was mild regurgitation.  Valve area by pressure half-time: 2.97 cm^2. Indexed valve area by pressure half-time: 1.54 cm^2/m^2. Valve area by continuity equation (using LVOT flow): 1.74 cm^2. Indexed valve area by continuity equation (using LVOT flow): 0.9 cm^2/m^2. Mean gradient (D): 5 mm Hg. Peak gradient (D): 14 mm Hg.  ------------------------------------------------------------------- Left atrium: Severely dilated at 65 ml/m2.  ------------------------------------------------------------------- Atrial septum: No defect or patent foramen ovale was identified.  ------------------------------------------------------------------- Right ventricle: The cavity size was mildly dilated. Systolic function was normal.  ------------------------------------------------------------------- Pulmonic valve:  The valve appears to be grossly normal. Doppler: There was trivial  regurgitation.  ------------------------------------------------------------------- Tricuspid valve:  Doppler: There was moderate regurgitation.  ------------------------------------------------------------------- Pulmonary artery:  The main pulmonary artery was normal-sized.  ------------------------------------------------------------------- Right atrium: Moderately dilated at 24 cm2.  ------------------------------------------------------------------- Pericardium: There was no pericardial effusion.  ------------------------------------------------------------------- Systemic veins: The IVC is <2.1 cm, but does not collapse >50%, suggesting an elevated RA pressure of 8 mmHg.  ------------------------------------------------------------------- Measurements  Left ventricle              Value     Reference LV ID, ED, PLAX chordal          49  mm    43 - 52 LV ID, ES, PLAX chordal          36  mm    23 - 38 LV fx shortening, PLAX chordal  (L)   27  %    >=29 LV PW thickness, ED            12  mm    --------- IVS/LV PW ratio, ED            1       <=1.3 Stroke volume, 2D             52  ml    --------- Stroke volume/bsa, 2D           27  ml/m^2  --------- LV e&', lateral              9.08 cm/s   --------- LV E/e&', lateral             20.37     --------- LV e&', medial               6.49 cm/s   --------- LV E/e&', medial              28.51     --------- LV e&', average              7.79 cm/s   --------- LV E/e&', average             23.76     ---------  Ventricular septum            Value     Reference IVS thickness, ED             12  mm    ---------  LVOT                   Value  Reference LVOT ID, S                19  mm    --------- LVOT area                 2.84 cm^2   --------- LVOT mean velocity, S           58.9 cm/s   --------- LVOT VTI, S                18.3 cm    ---------  Aortic valve               Value     Reference Aortic valve mean velocity, S       275  cm/s   --------- Aortic valve VTI, S            87.5 cm    --------- Aortic mean gradient, S          35  mm Hg  --------- VTI ratio, LVOT/AV            0.21      --------- Aortic valve area, VTI          0.59 cm^2   --------- Aortic valve area/bsa, VTI        0.31 cm^2/m^2 --------- Velocity ratio, mean, LVOT/AV       0.21      --------- Aortic valve area, mean velocity     0.61 cm^2   --------- Aortic valve area/bsa, mean        0.32 cm^2/m^2 --------- velocity Aortic regurg pressure half-time     292  ms    ---------  Aorta                   Value     Reference Aortic root ID, ED            33  mm    ---------  Left atrium                Value     Reference LA ID, A-P, ES              59  mm    --------- LA ID/bsa, A-P          (H)   3.06 cm/m^2  <=2.2 LA volume, S               120  ml    --------- LA volume/bsa, S             62.3 ml/m^2  --------- LA volume, ES, 1-p A4C          116  ml    --------- LA volume/bsa, ES, 1-p A4C        60.2 ml/m^2  --------- LA volume, ES, 1-p A2C          123  ml    --------- LA volume/bsa, ES, 1-p A2C        63.9 ml/m^2  ---------  Mitral valve               Value     Reference Mitral E-wave peak velocity        185   cm/s   --------- Mitral mean velocity, D          104  cm/s   --------- Mitral deceleration time         187  ms    150 - 230 Mitral pressure  half-time         83  ms    --------- Mitral mean gradient, D          5   mm Hg  --------- Mitral peak gradient, D          14  mm Hg  --------- Mitral E/A ratio, peak          2.8      --------- Mitral valve area, PHT, DP        2.97 cm^2   --------- Mitral valve area/bsa, PHT, DP      1.54 cm^2/m^2 --------- Mitral valve area, LVOT          1.74 cm^2   --------- continuity Mitral valve area/bsa, LVOT        0.9  cm^2/m^2 --------- continuity Mitral annulus VTI, D           29.9 cm    ---------  Pulmonary arteries            Value     Reference PA pressure, S, DP        (H)   54  mm Hg  <=30  Tricuspid valve              Value     Reference Tricuspid regurg peak velocity      351  cm/s   --------- Tricuspid peak RV-RA gradient       49  mm Hg  ---------  Right ventricle              Value     Reference RV s&', lateral, S             16.6 cm/s   ---------  Legend: (L) and (H) mark values outside specified reference range.  ------------------------------------------------------------------- Prepared and Electronically Authenticated by  Zoila Shutter MD 2016-02-10T12:41:45    Cardiac Catheterization Procedure Note  Name: Jamie Howell MRN: [id number] DOB: 06-19-1950  Procedure: Right Heart Cath, catheter placement for selective angiography, Selective Coronary Angiography, aortic root angiography   Indication: Valvular heart disease. This 64 year old lady with history of mitral valve disease and previous mechanical mitral valve replacement now has developed progressive exertional  dyspnea and is found to have severe aortic stenosis.   Procedural Details: There was an indwelling IV in a right antecubital vein. Using normal sterile technique, the IV was changed out for a 5 Fr brachial sheath over a 0.018 inch wire. The right wrist was then prepped, draped, and anesthetized with 1% lidocaine. Using the modified Seldinger technique a 5/6 French Slender sheath was placed in the right radial artery. Intra-arterial verapamil was administered through the radial artery sheath. IV heparin was administered after a JR4 catheter was advanced into the central aorta. A Swan-Ganz catheter was used for the right heart catheterization. it was difficult to advance the catheter. An angled Glidewire was used. A venogram was performed to assist with advancing the catheter to the heart. Standard protocol was followed for recording of right heart pressures and sampling of oxygen saturations. Fick cardiac output was calculated. Standard Judkins catheters were used for selective coronary angiography anaortic root angiography. There were no immediate procedural complications. The patient was transferred to the post catheterization recovery area for further monitoring.  Procedural Findings: Hemodynamics RA 15 RV 57/15 PA 49/22 with a mean of 30 PCWP A wave 22, V wave 23, mean of 20 LV NOT RECORDED  AO 112/57  Oxygen saturations: PA 67 Ao 97  Cardiac Output (  Fick) 5.2  Cardiac Index (Fick) 2.9  Coronary angiography: Coronary dominance: right  Left mainstem:Widely patent with no stenosis.  Left anterior descending (LAthe LAD is widely patent and reaches the LV apex. The diagonal branches are patent with no stenosis.  Left circumflex (LCthe intermediate branch is patent with no stenosis. The AV circumflex is large and supplies a large first obtuse marginal. There is no stenosis throughout.  Right coronary artery (RCA): large, dominant vessel. There is no obstructive disease  present. The PDA and PLA branches are patent.   Left ventriculography: Deferred  Aortic Root Angiography: The aortic valve is severely calcified. There is mild aortic insufficiency. The aortic valve mobility is restricted.   The mechanical bileaflet mitral valve appears to have normal leaflet motion. The mitral annulus is severely calcified.   Estimated Blood Loss: minimal  Final Conclusions:  1. Widely patent coronary arteries 2. Status post mechanical mitral valve replacement with normal motion of the bileaflet mechanical prosthesis by fluoroscopy 3. Severely calcified native aortic valve with restricted valve leaflet mobility and known severe aortic stenosis by echo criteria 4. Elevated right and left heart intracardiac filling pressures   Recommendations: cardiac surgical evaluation for consideration of redo sternotomy/conventional aortic valve replacement versus TAVR   Jamie Bollman MD, [facility] 06/28/2014, 11:54 AM   Cardiac TAVR CT  TECHNIQUE: The patient was scanned on a Philips [id number] scanner. A 120 kV retrospective scan was triggered in the descending thoracic aorta at 111 HU's. Gantry rotation speed was 270 msecs and collimation was .9 mm. No beta blockade or nitro were given. The 3D data set was reconstructed in 5% intervals of the R-R cycle. Systolic and diastolic phases were analyzed on a dedicated work station using MPR, MIP and VRT modes. The patient received 80 cc of contrast.  FINDINGS: Aortic Valve: Trileaflet non coronary cusp less calcified. Annulus is more spherical than usual with no calcium.  There is a normal functioning bileaflet mechanical Mitral valve  There is severe biatrial enlargement  Sinotubular Junction: 2.9 cm  Ascending Thoracic Aorta: 3.4 cm  Descending Thoracic Aorta: 1.9 cm  Sinus of Valsalva Measurements:  Non-coronary: 34 mm  Right -coronary: 32 mm  Left -coronary: 30 mm  Coronary Artery Height above  Annulus:  Left Main: 15.8 mm  Right Coronary: 14.8 mm  Virtual Basal Annulus Measurements:  Maximum/Minimum Diameter: 26.7 mm x 23 mm more spherical than usual  Area: 534 mm2  Optimum Fluoroscopic Angle for Delivery: RAO 6 degrees Cranial 0 degrees  IMPRESSION: 1) Calcified trileaflet aortic valve. Spherical non calcified annulus measures 534 mm2 suitable for underinflated 29 mm Sapien 3  2) LVOT not narrow measures 2.5 cm with no anterior mitral leaflet due to MVR  3) Normal appearing bileaflet mechanical MVR  4) Severe biatrial enlargement  5) Suitable coronary height for delivery of valve  6) Optimum Angiographic angle for delivery RAO 6 degrees Cranial 0 degrees  Charlton Haws   Electronically Signed  By: Charlton Haws M.D.  On: 07/14/2014 17:55      Study Result     EXAM: OVER-READ INTERPRETATION CT CHEST  The following report is an over-read performed by radiologist Jamie. Irma Newn[facility] on 07/14/2014. This over-read does not include interpretation of cardiac or coronary anatomy or pathology. The coronary CTA interpretation by the cardiologist is attached.  COMPARISON: Portable chest 07/01/2003. This study was performed in conjunction with a routine CTA of the chest, abdomen and pelvis.  FINDINGS: Small mediastinal and hilar lymph  nodes are better seen on accompanying chest CTA. There is a small hiatal hernia.  Cardiovascular findings are reported separately.  The lungs demonstrate patchy ground-glass opacities with mosaic attenuation. No endobronchial lesion or confluent airspace opacity identified.  The visualized upper abdomen is notable for contour irregularity of the liver suspicious for cirrhosis. The spleen appears enlarged.  IMPRESSION: 1. Mosaic attenuation in the lungs, likely related to small airways disease. 2. Nonspecific small mediastinal lymph nodes, not  pathologically enlarged. 3. Probable hepatic cirrhosis and portal hypertension manifesting as splenomegaly. 4. Cardiovascular findings are dictated separately. In addition, chest CTA findings dictated separately.  Electronically Signed: By: Carey Bullocks M.D. On: 07/14/2014 12:14      CT ANGIOGRAPHY CHEST, ABDOMEN AND PELVIS  TECHNIQUE: Multidetector CT imaging through the chest, abdomen and pelvis was performed using the standard protocol during bolus administration of intravenous contrast. Multiplanar reconstructed images and MIPs were obtained and reviewed to evaluate the vascular anatomy.  CONTRAST: 70mL OMNIPAQUE IOHEXOL 350 MG/ML SOLN  COMPARISON: None.  FINDINGS: CTA CHEST FINDINGS  Mediastinum/Lymph Nodes: There multiple borderline enlarged and mildly enlarged mediastinal lymph nodes, largest of which are in the prevascular nodal station measuring 11 mm in short axis, and in the high right paratracheal nodal station measuring up to 13 mm in short axis. Cardiomegaly with left atrial dilatation. Mechanical mitral valve. Extensive calcifications of the mitral annulus and subvalvular apparatus. Extensive calcifications of the aortic valve. There is no significant pericardial fluid, thickening or pericardial calcification. Esophagus is unremarkable in appearance. Multiple large paraesophageal varices, particularly adjacent to the gastroesophageal junction. No axillary lymphadenopathy.  Lungs/Pleura: Mosaic attenuation throughout the lung parenchyma suggestive a significant air trapping. No consolidative airspace disease. No pleural effusions. No suspicious appearing pulmonary nodules or masses.  Musculoskeletal/Soft Tissues: Median sternotomy wires. There are no aggressive appearing lytic or blastic lesions noted in the visualized portions of the skeleton.  CTA ABDOMEN AND PELVIS FINDINGS  Hepatobiliary: The liver has a shrunken appearance and  nodular contour, compatible with underlying cirrhosis. No definite suspicious cystic or solid hepatic lesion identified. Status post cholecystectomy. No intra or extrahepatic biliary ductal dilatation. Portal vein is mildly dilated measuring up to 15 mm in diameter.  Pancreas: Unremarkable.  Spleen: Spleen is enlarged measuring 15.3 x 7.9 x 18.5 cm (estimated splenic volume of 1,118 mL).  Adrenals/Urinary Tract: Bilateral adrenal glands are normal in appearance. Mild multifocal cortical thinning in the kidneys bilaterally, presumably chronic post infectious or inflammatory scarring.  Stomach/Bowel: The appearance of the stomach is normal. No pathologic dilatation of small bowel or colon. Numerous colonic diverticulae are present, particularly in the sigmoid colon, without surrounding inflammatory changes to suggest an acute diverticulitis at this time. Normal appendix.  Vascular/Lymphatic: Vascular findings and measurements pertinent to potential TAVR procedure, as below. Multiple dilated veins in the upper thighs bilaterally (left greater than right), with the largest varix on the right side immediately anterior to the distal common femoral artery measuring up to 4.0 x 4.4 cm. Multiple borderline enlarged retroperitoneal and mesenteric lymph nodes.  Reproductive: Status post hysterectomy. Ovaries are unremarkable in appearance.  Other: Trace volume of ascites. Small epigastric ventral hernia containing some ascites and omental fat.  Musculoskeletal: There are no aggressive appearing lytic or blastic lesions noted in the visualized portions of the skeleton.  VASCULAR MEASUREMENTS PERTINENT TO TAVR:  AORTA:  Minimal Aortic Diameter - 13 x 13 mm  Severity of Aortic Calcification - mild  RIGHT PELVIS:  Right Common Iliac Artery -  Minimal  Diameter - 9.2 x 8.9 mm  Tortuosity - mild  Calcification - mild  Right External Iliac Artery -  Minimal  Diameter - 7.5 x 7.2 mm  Tortuosity - mild  Calcification - none  Right Common Femoral Artery -  Minimal Diameter - 6.2 x 6.7 mm  Tortuosity - mild  Calcification - none  LEFT PELVIS:  Left Common Iliac Artery -  Minimal Diameter - 8.3 x 8.1 mm  Tortuosity - mild  Calcification - mild  Left External Iliac Artery -  Minimal Diameter - 6.5 x 6.7 mm  Tortuosity - mild  Calcification - none  Left Common Femoral Artery -  Minimal Diameter - 6.3 x 6.5 mm  Tortuosity - mild  Calcification - none  Review of the MIP images confirms the above findings.  IMPRESSION: 1. Vascular findings and measurements pertinent to potential TAVR procedure, as detailed above. This patient does appear to have suitable pelvic arterial access. 2. However, this patient has multiple large venous varices, many of which overlie deep pelvic access vessels. Most notably, the largest venous varix is immediately superficial to the right common femoral vein. 3. Stigmata of cirrhosis with portal hypertension, including large esophageal varices, and splenomegaly. 4. Colonic diverticulosis without findings to suggest acute diverticulitis at this time. 5. Additional incidental findings, as above.   Electronically Signed  By: Trudie Reed M.D.  On: 07/19/2014 14:30          STS Risk Calculator  Procedure    AVR  Risk of Mortality   3.0% Morbidity or Mortality  23.5% Prolonged LOS   9.7% Short LOS    26.1% Permanent Stroke   3.5% Prolonged Vent Support  15.4% DSW Infection    0.1% Renal Failure    3.8% Reoperation    9.5%     Impression:  Patient has stage D severe symptomatic aortic stenosis. She presents with a several month history of worsening symptoms of exertional shortness of breath and fatigue consistent with chronic diastolic congestive heart failure, currently New [place] Heart Association functional class 3. She underwent mitral valve  replacement using a bileaflet mechanical prosthesis in the distant past and remains chronically anticoagulated using warfarin. I have personally reviewed the patient's recent transthoracic echocardiogram and diagnostic cardiac catheterization. The aortic valve appears tricuspid with severe thickening and restricted leaflet mobility involving all 3 leaflets. There is mild aortic insufficiency. Peak velocity across the aortic valve measured 3.9 m/s corresponding to estimated mean transvalvular gradient of approximately 40 mmHg. The diameter of the left ventricular outflow tract was measured 1.9 cm. The mitral valve prosthesis appears to be functioning normally. The patient additionally has moderate pulmonary hypertension and moderate tricuspid regurgitation. Cardiac gated CT angiogram of the heart demonstrates anatomical characteristics that appear relatively favorable for transcatheter aortic valve replacement. There is not much annular calcification, there appears to be adequate coronary height and sinotubular dimensions, and the overall size of the aortic annulus appears more than adequate for at least a 26 mm transcatheter valve. CT angiogram of the aorta and iliac vessels demonstrates adequate pelvic arterial access for a transfemoral approach. Of note, CT angiogram also demonstrates the presence of significant portal hypertension with esophageal varices and splenomegaly. Risks associated with conventional surgical aortic valve replacement through a redo median sternotomy would clearly be very high, much higher than that predicted by the STS risk calculator. Under the circumstances I feel that transcatheter aortic valve replacement would be a far less risky treatment alternative, although the patient's relatively young age  raises questions about the long-term durability of currently available transcatheter heart valves.   Plan:  I have again reviewed the relative risks and benefits of both high risk  conventional surgical aortic valve replacement via redo median sternotomy and transcatheter aortic valve replacement with the patient and her close friend in the office today. I believe that transcatheter aortic valve replacement would be associated with less procedural risk and a far less difficult recovery than high risk conventional surgery. Transcatheter aortic valve replacement might also be associated with an improved hemodynamic result with likely minimal residual transvalvular gradient. In contrast, conventional surgical aortic valve replacement using a mechanical prosthesis would minimize the potential for late structural valve deterioration and failure and the possible need for repeat intervention in the distant future. However, the patient's native aortic valve and aortic annulus is large enough to support at least a 26 mm transcatheter heart valve. Therefore, valve in valve redo transcatheter aortic valve replacement could potentially be used in the future if the patient were ever to develop late structural valve deterioration and failure. After discussing the relative risks and benefits of each approach at length, the patient desires to proceed with transcatheter aortic valve replacement is no alternative to high-risk conventional surgery in the near future, once she has completely recovered from her dental extraction.  Following the decision to proceed with transcatheter aortic valve replacement, a discussion has been held regarding what types of management strategies would be attempted intraoperatively in the event of life-threatening complications, including whether or not the patient would be considered a candidate for the use of cardiopulmonary bypass and/or conversion to open sternotomy for attempted surgical intervention. The patient has been advised of a variety of complications that might develop including but not limited to risks of death, stroke, paravalvular leak, aortic dissection  or other major vascular complications, aortic annulus rupture, device embolization, cardiac rupture or perforation, mitral regurgitation, acute myocardial infarction, arrhythmia, heart block or bradycardia requiring permanent pacemaker placement, congestive heart failure, respiratory failure, renal failure, pneumonia, infection, other late complications related to structural valve deterioration or migration, or other complications that might ultimately cause a temporary or permanent loss of functional independence or other long term morbidity. The patient provides full informed consent for the procedure as described and all questions were answered.  We tentatively plan to proceed with transcatheter aortic valve replacement via open transfemoral approach on Tuesday, 09/28/2014. The patient will return for follow-up approximately 7-10 days prior to her surgery. At that time she will undergo a second opinion surgical consultation. Once final plans are in place the patient will be instructed when to stop taking Coumadin and begin bridging therapy using outpatient Lovenox injections. All of her questions have been addressed.   Salvatore Decent. Cornelius Moras, MD 08/17/2014 3:43 PM

## 2014-09-27 NOTE — Anesthesia Preprocedure Evaluation (Addendum)
Anesthesia Evaluation  Patient identified by MRN, date of birth, ID band Patient awake    Reviewed: Allergy & Precautions, NPO status , Patient's Chart, lab work & pertinent test results  History of Anesthesia Complications Negative for: history of anesthetic complications  Airway Mallampati: I  TM Distance: >3 FB Neck ROM: Full    Dental  (+) Edentulous Upper, Edentulous Lower   Pulmonary shortness of breath and with exertion,  breath sounds clear to auscultation        Cardiovascular hypertension, Pt. on medications and Pt. on home beta blockers +CHF + Valvular Problems/Murmurs AS Rhythm:Regular Rate:Normal + Systolic murmurs    Neuro/Psych    GI/Hepatic (+) Cirrhosis -  Esophageal Varices    ,   Endo/Other  Hypothyroidism   Renal/GU      Musculoskeletal   Abdominal   Peds  Hematology  (+) Blood dyscrasia (thrombocytopenia), anemia ,   Anesthesia Other Findings   Reproductive/Obstetrics                       Anesthesia Physical Anesthesia Plan  ASA: IV  Anesthesia Plan: General   Post-op Pain Management:    Induction: Intravenous  Airway Management Planned: Oral ETT  Additional Equipment: PA Cath and TEE  Intra-op Plan:   Post-operative Plan: Extubation in OR  Informed Consent: I have reviewed the patients History and Physical, chart, labs and discussed the procedure including the risks, benefits and alternatives for the proposed anesthesia with the patient or authorized representative who has indicated his/her understanding and acceptance.   Dental advisory given  Plan Discussed with: CRNA and Surgeon  Anesthesia Plan Comments:        Anesthesia Quick Evaluation

## 2014-09-28 ENCOUNTER — Inpatient Hospital Stay (HOSPITAL_COMMUNITY): Payer: 59 | Admitting: Anesthesiology

## 2014-09-28 ENCOUNTER — Encounter (HOSPITAL_COMMUNITY): Payer: Self-pay | Admitting: *Deleted

## 2014-09-28 ENCOUNTER — Inpatient Hospital Stay (HOSPITAL_COMMUNITY): Payer: 59

## 2014-09-28 ENCOUNTER — Inpatient Hospital Stay (HOSPITAL_COMMUNITY)
Admission: RE | Admit: 2014-09-28 | Discharge: 2014-10-02 | DRG: 267 | Disposition: A | Discharge status: Home / Self Care | Payer: 59 | Source: Ambulatory Visit | Attending: Cardiovascular Disease | Admitting: Cardiovascular Disease

## 2014-09-28 ENCOUNTER — Encounter (HOSPITAL_COMMUNITY)
Admission: RE | Disposition: A | Discharge status: Home / Self Care | Payer: 59 | Source: Ambulatory Visit | Attending: Cardiovascular Disease

## 2014-09-28 DIAGNOSIS — I5032 Chronic diastolic (congestive) heart failure: Secondary | ICD-10-CM | POA: Diagnosis present

## 2014-09-28 DIAGNOSIS — I10 Essential (primary) hypertension: Secondary | ICD-10-CM | POA: Diagnosis present

## 2014-09-28 DIAGNOSIS — Z952 Presence of prosthetic heart valve: Secondary | ICD-10-CM | POA: Diagnosis not present

## 2014-09-28 DIAGNOSIS — Z8249 Family history of ischemic heart disease and other diseases of the circulatory system: Secondary | ICD-10-CM | POA: Diagnosis not present

## 2014-09-28 DIAGNOSIS — I082 Rheumatic disorders of both aortic and tricuspid valves: Principal | ICD-10-CM | POA: Diagnosis present

## 2014-09-28 DIAGNOSIS — I059 Rheumatic mitral valve disease, unspecified: Secondary | ICD-10-CM | POA: Diagnosis present

## 2014-09-28 DIAGNOSIS — R0602 Shortness of breath: Secondary | ICD-10-CM | POA: Diagnosis present

## 2014-09-28 DIAGNOSIS — Z006 Encounter for examination for normal comparison and control in clinical research program: Secondary | ICD-10-CM

## 2014-09-28 DIAGNOSIS — Z7982 Long term (current) use of aspirin: Secondary | ICD-10-CM | POA: Diagnosis not present

## 2014-09-28 DIAGNOSIS — I350 Nonrheumatic aortic (valve) stenosis: Secondary | ICD-10-CM

## 2014-09-28 DIAGNOSIS — D61818 Other pancytopenia: Secondary | ICD-10-CM | POA: Diagnosis not present

## 2014-09-28 DIAGNOSIS — K5900 Constipation, unspecified: Secondary | ICD-10-CM | POA: Diagnosis not present

## 2014-09-28 DIAGNOSIS — Z91048 Other nonmedicinal substance allergy status: Secondary | ICD-10-CM | POA: Diagnosis not present

## 2014-09-28 DIAGNOSIS — I5042 Chronic combined systolic (congestive) and diastolic (congestive) heart failure: Secondary | ICD-10-CM | POA: Diagnosis present

## 2014-09-28 DIAGNOSIS — D62 Acute posthemorrhagic anemia: Secondary | ICD-10-CM | POA: Diagnosis not present

## 2014-09-28 DIAGNOSIS — E669 Obesity, unspecified: Secondary | ICD-10-CM | POA: Diagnosis present

## 2014-09-28 DIAGNOSIS — Z6837 Body mass index (BMI) 37.0-37.9, adult: Secondary | ICD-10-CM | POA: Diagnosis not present

## 2014-09-28 DIAGNOSIS — Z880 Allergy status to penicillin: Secondary | ICD-10-CM | POA: Diagnosis not present

## 2014-09-28 DIAGNOSIS — E6609 Other obesity due to excess calories: Secondary | ICD-10-CM | POA: Diagnosis present

## 2014-09-28 DIAGNOSIS — Z882 Allergy status to sulfonamides status: Secondary | ICD-10-CM | POA: Diagnosis not present

## 2014-09-28 DIAGNOSIS — Z954 Presence of other heart-valve replacement: Secondary | ICD-10-CM | POA: Diagnosis not present

## 2014-09-28 DIAGNOSIS — I4819 Other persistent atrial fibrillation: Secondary | ICD-10-CM | POA: Diagnosis present

## 2014-09-28 DIAGNOSIS — K7460 Unspecified cirrhosis of liver: Secondary | ICD-10-CM | POA: Diagnosis present

## 2014-09-28 DIAGNOSIS — I351 Nonrheumatic aortic (valve) insufficiency: Secondary | ICD-10-CM | POA: Diagnosis present

## 2014-09-28 DIAGNOSIS — Z79899 Other long term (current) drug therapy: Secondary | ICD-10-CM

## 2014-09-28 DIAGNOSIS — I4892 Unspecified atrial flutter: Secondary | ICD-10-CM | POA: Diagnosis not present

## 2014-09-28 DIAGNOSIS — I8510 Secondary esophageal varices without bleeding: Secondary | ICD-10-CM | POA: Diagnosis present

## 2014-09-28 DIAGNOSIS — I480 Paroxysmal atrial fibrillation: Secondary | ICD-10-CM | POA: Diagnosis present

## 2014-09-28 DIAGNOSIS — D696 Thrombocytopenia, unspecified: Secondary | ICD-10-CM | POA: Diagnosis present

## 2014-09-28 DIAGNOSIS — I272 Other secondary pulmonary hypertension: Secondary | ICD-10-CM | POA: Diagnosis present

## 2014-09-28 DIAGNOSIS — I8500 Esophageal varices without bleeding: Secondary | ICD-10-CM | POA: Diagnosis present

## 2014-09-28 DIAGNOSIS — Z7901 Long term (current) use of anticoagulants: Secondary | ICD-10-CM | POA: Diagnosis not present

## 2014-09-28 DIAGNOSIS — I099 Rheumatic heart disease, unspecified: Secondary | ICD-10-CM | POA: Diagnosis present

## 2014-09-28 DIAGNOSIS — E039 Hypothyroidism, unspecified: Secondary | ICD-10-CM | POA: Diagnosis present

## 2014-09-28 DIAGNOSIS — R161 Splenomegaly, not elsewhere classified: Secondary | ICD-10-CM | POA: Diagnosis present

## 2014-09-28 DIAGNOSIS — K766 Portal hypertension: Secondary | ICD-10-CM | POA: Diagnosis present

## 2014-09-28 DIAGNOSIS — I493 Ventricular premature depolarization: Secondary | ICD-10-CM | POA: Diagnosis not present

## 2014-09-28 DIAGNOSIS — I359 Nonrheumatic aortic valve disorder, unspecified: Secondary | ICD-10-CM | POA: Diagnosis not present

## 2014-09-28 DIAGNOSIS — I700 Atherosclerosis of aorta: Secondary | ICD-10-CM | POA: Diagnosis present

## 2014-09-28 DIAGNOSIS — Z953 Presence of xenogenic heart valve: Secondary | ICD-10-CM

## 2014-09-28 HISTORY — DX: Presence of prosthetic heart valve: Z95.2

## 2014-09-28 HISTORY — PX: TEE WITHOUT CARDIOVERSION: SHX5443

## 2014-09-28 HISTORY — PX: TRANSCATHETER AORTIC VALVE REPLACEMENT, TRANSFEMORAL: SHX6400

## 2014-09-28 LAB — POCT I-STAT, CHEM 8
BUN: 15 mg/dL (ref 6–20)
BUN: 15 mg/dL (ref 6–20)
BUN: 15 mg/dL (ref 6–20)
BUN: 15 mg/dL (ref 6–20)
CHLORIDE: 101 mmol/L (ref 101–111)
CREATININE: 0.8 mg/dL (ref 0.44–1.00)
Calcium, Ion: 1.17 mmol/L (ref 1.13–1.30)
Calcium, Ion: 1.15 mmol/L (ref 1.13–1.30)
Calcium, Ion: 1.17 mmol/L (ref 1.13–1.30)
Calcium, Ion: 1.18 mmol/L (ref 1.13–1.30)
Chloride: 102 mmol/L (ref 101–111)
Chloride: 102 mmol/L (ref 101–111)
Chloride: 102 mmol/L (ref 101–111)
Creatinine, Ser: 0.8 mg/dL (ref 0.44–1.00)
Creatinine, Ser: 0.8 mg/dL (ref 0.44–1.00)
Creatinine, Ser: 0.8 mg/dL (ref 0.44–1.00)
Glucose, Bld: 154 mg/dL — ABNORMAL HIGH (ref 65–99)
Glucose, Bld: 150 mg/dL — ABNORMAL HIGH (ref 65–99)
Glucose, Bld: 150 mg/dL — ABNORMAL HIGH (ref 65–99)
Glucose, Bld: 154 mg/dL — ABNORMAL HIGH (ref 65–99)
HCT: 27 % — ABNORMAL LOW (ref 36.0–46.0)
HCT: 30 % — ABNORMAL LOW (ref 36.0–46.0)
HCT: 31 % — ABNORMAL LOW (ref 36.0–46.0)
HEMATOCRIT: 27 % — AB (ref 36.0–46.0)
HEMOGLOBIN: 9.2 g/dL — AB (ref 12.0–15.0)
HEMOGLOBIN: 10.2 g/dL — AB (ref 12.0–15.0)
Hemoglobin: 10.5 g/dL — ABNORMAL LOW (ref 12.0–15.0)
Hemoglobin: 9.2 g/dL — ABNORMAL LOW (ref 12.0–15.0)
Potassium: 3.9 mmol/L (ref 3.5–5.1)
Potassium: 4.1 mmol/L (ref 3.5–5.1)
Potassium: 4.1 mmol/L (ref 3.5–5.1)
Potassium: 4.2 mmol/L (ref 3.5–5.1)
Sodium: 136 mmol/L (ref 135–145)
Sodium: 135 mmol/L (ref 135–145)
Sodium: 136 mmol/L (ref 135–145)
Sodium: 136 mmol/L (ref 135–145)
TCO2: 23 mmol/L (ref 0–100)
TCO2: 22 mmol/L (ref 0–100)
TCO2: 22 mmol/L (ref 0–100)
TCO2: 23 mmol/L (ref 0–100)

## 2014-09-28 LAB — GLUCOSE, CAPILLARY
Glucose-Capillary: 114 mg/dL — ABNORMAL HIGH (ref 65–99)
Glucose-Capillary: 114 mg/dL — ABNORMAL HIGH (ref 65–99)
Glucose-Capillary: 126 mg/dL — ABNORMAL HIGH (ref 65–99)
Glucose-Capillary: 134 mg/dL — ABNORMAL HIGH (ref 65–99)
Glucose-Capillary: 139 mg/dL — ABNORMAL HIGH (ref 65–99)

## 2014-09-28 LAB — APTT
APTT: 35 s (ref 24–37)
aPTT: 37 seconds (ref 24–37)

## 2014-09-28 LAB — POCT I-STAT 3, ART BLOOD GAS (G3+)
Acid-base deficit: 2 mmol/L (ref 0.0–2.0)
BICARBONATE: 23.3 meq/L (ref 20.0–24.0)
O2 Saturation: 93 %
PH ART: 7.374 (ref 7.350–7.450)
Patient temperature: 36.5
TCO2: 24 mmol/L (ref 0–100)
pCO2 arterial: 39.7 mmHg (ref 35.0–45.0)
pO2, Arterial: 68 mmHg — ABNORMAL LOW (ref 80.0–100.0)

## 2014-09-28 LAB — POCT I-STAT 4, (NA,K, GLUC, HGB,HCT)
Glucose, Bld: 138 mg/dL — ABNORMAL HIGH (ref 65–99)
HEMATOCRIT: 24 % — AB (ref 36.0–46.0)
HEMOGLOBIN: 8.2 g/dL — AB (ref 12.0–15.0)
Potassium: 3.9 mmol/L (ref 3.5–5.1)
Sodium: 137 mmol/L (ref 135–145)

## 2014-09-28 LAB — PROTIME-INR
INR: 1.31 (ref 0.00–1.49)
INR: 1.49 (ref 0.00–1.49)
PROTHROMBIN TIME: 16.5 s — AB (ref 11.6–15.2)
PROTHROMBIN TIME: 18.1 s — AB (ref 11.6–15.2)

## 2014-09-28 LAB — HEMOGLOBIN A1C
HEMOGLOBIN A1C: 6.2 % — AB (ref 4.8–5.6)
Mean Plasma Glucose: 131 mg/dL

## 2014-09-28 LAB — CBC
HCT: 24.7 % — ABNORMAL LOW (ref 36.0–46.0)
Hemoglobin: 8 g/dL — ABNORMAL LOW (ref 12.0–15.0)
MCH: 27.7 pg (ref 26.0–34.0)
MCHC: 32.4 g/dL (ref 30.0–36.0)
MCV: 85.5 fL (ref 78.0–100.0)
Platelets: 42 10*3/uL — ABNORMAL LOW (ref 150–400)
RBC: 2.89 MIL/uL — ABNORMAL LOW (ref 3.87–5.11)
RDW: 17.5 % — AB (ref 11.5–15.5)
WBC: 2.8 10*3/uL — ABNORMAL LOW (ref 4.0–10.5)

## 2014-09-28 LAB — PREPARE RBC (CROSSMATCH)

## 2014-09-28 SURGERY — IMPLANTATION, AORTIC VALVE, TRANSCATHETER, FEMORAL APPROACH
Anesthesia: General | Site: Chest

## 2014-09-28 MED ORDER — CLOPIDOGREL BISULFATE 75 MG PO TABS
75.0000 mg | ORAL_TABLET | Freq: Every day | ORAL | Status: DC
  Administered 2014-09-29: 75 mg via ORAL
  Filled 2014-09-28 (×2): qty 1

## 2014-09-28 MED ORDER — LACTATED RINGERS IV SOLN
INTRAVENOUS | Status: DC | PRN
  Administered 2014-09-28: 07:00:00 via INTRAVENOUS

## 2014-09-28 MED ORDER — ONDANSETRON HCL 4 MG/2ML IJ SOLN
INTRAMUSCULAR | Status: DC | PRN
  Administered 2014-09-28: 4 mg via INTRAVENOUS

## 2014-09-28 MED ORDER — PROPOFOL 10 MG/ML IV BOLUS
INTRAVENOUS | Status: DC | PRN
  Administered 2014-09-28: 100 mg via INTRAVENOUS
  Administered 2014-09-28: 30 mg via INTRAVENOUS

## 2014-09-28 MED ORDER — TRAMADOL HCL 50 MG PO TABS: 50.0000 mg | ORAL_TABLET | ORAL | Status: DC | PRN

## 2014-09-28 MED ORDER — PNEUMOCOCCAL VAC POLYVALENT 25 MCG/0.5ML IJ INJ
0.5000 mL | INJECTION | INTRAMUSCULAR | Status: AC | PRN
  Administered 2014-10-02: 0.5 mL via INTRAMUSCULAR
  Filled 2014-09-28: qty 0.5

## 2014-09-28 MED ORDER — HYDROMORPHONE HCL 1 MG/ML IJ SOLN: 0.2500 mg | INTRAMUSCULAR | Status: DC | PRN

## 2014-09-28 MED ORDER — PROTAMINE SULFATE 10 MG/ML IV SOLN
INTRAVENOUS | Status: DC | PRN
Start: 2014-09-28 — End: 2014-09-28
  Administered 2014-09-28 (×2): 25 mg via INTRAVENOUS
  Administered 2014-09-28: 10 mg via INTRAVENOUS
  Administered 2014-09-28: 25 mg via INTRAVENOUS
  Administered 2014-09-28: 20 mg via INTRAVENOUS
  Administered 2014-09-28: 25 mg via INTRAVENOUS

## 2014-09-28 MED ORDER — SODIUM CHLORIDE 0.9 % IR SOLN
Status: DC | PRN
  Administered 2014-09-28 (×3): 500 mL

## 2014-09-28 MED ORDER — NEOSTIGMINE METHYLSULFATE 10 MG/10ML IV SOLN
INTRAVENOUS | Status: DC | PRN
Start: 2014-09-28 — End: 2014-09-28
  Administered 2014-09-28: 4 mg via INTRAVENOUS

## 2014-09-28 MED ORDER — CHLORHEXIDINE GLUCONATE 4 % EX LIQD: 60.0000 mL | Freq: Once | CUTANEOUS | Status: DC

## 2014-09-28 MED ORDER — 0.9 % SODIUM CHLORIDE (POUR BTL) OPTIME
TOPICAL | Status: DC | PRN
  Administered 2014-09-28: 5000 mL

## 2014-09-28 MED ORDER — MORPHINE SULFATE 2 MG/ML IJ SOLN
2.0000 mg | INTRAMUSCULAR | Status: DC | PRN
  Administered 2014-09-28 – 2014-09-29 (×4): 2 mg via INTRAVENOUS
  Filled 2014-09-28 (×4): qty 1

## 2014-09-28 MED ORDER — DEXTROSE 5 % IV SOLN
0.0000 ug/min | INTRAVENOUS | Status: DC
  Filled 2014-09-28: qty 2

## 2014-09-28 MED ORDER — SODIUM CHLORIDE 0.9 % IV SOLN
INTRAVENOUS | Status: DC
  Filled 2014-09-28: qty 2.5

## 2014-09-28 MED ORDER — ACETAMINOPHEN 500 MG PO TABS
1000.0000 mg | ORAL_TABLET | Freq: Four times a day (QID) | ORAL | Status: DC
Start: 2014-09-29 — End: 2014-10-02
  Administered 2014-09-29 – 2014-10-01 (×9): 1000 mg via ORAL
  Filled 2014-09-28 (×18): qty 2

## 2014-09-28 MED ORDER — ACETAMINOPHEN 160 MG/5ML PO SOLN: 650.0000 mg | Freq: Once | ORAL | Status: AC

## 2014-09-28 MED ORDER — PANTOPRAZOLE SODIUM 40 MG PO TBEC
40.0000 mg | DELAYED_RELEASE_TABLET | Freq: Every day | ORAL | Status: DC
  Administered 2014-09-30 – 2014-10-02 (×3): 40 mg via ORAL
  Filled 2014-09-28 (×3): qty 1

## 2014-09-28 MED ORDER — FAMOTIDINE IN NACL 20-0.9 MG/50ML-% IV SOLN
20.0000 mg | Freq: Two times a day (BID) | INTRAVENOUS | Status: AC
  Administered 2014-09-28 (×2): 20 mg via INTRAVENOUS
  Filled 2014-09-28 (×2): qty 50

## 2014-09-28 MED ORDER — ACETAMINOPHEN 160 MG/5ML PO SOLN: 1000.0000 mg | Freq: Four times a day (QID) | ORAL | Status: DC

## 2014-09-28 MED ORDER — ASPIRIN EC 325 MG PO TBEC
325.0000 mg | DELAYED_RELEASE_TABLET | Freq: Every day | ORAL | Status: DC
  Filled 2014-09-28: qty 1

## 2014-09-28 MED ORDER — MORPHINE SULFATE 2 MG/ML IJ SOLN
1.0000 mg | INTRAMUSCULAR | Status: AC | PRN
  Administered 2014-09-28: 2 mg via INTRAVENOUS
  Filled 2014-09-28: qty 1

## 2014-09-28 MED ORDER — PROMETHAZINE HCL 25 MG/ML IJ SOLN: 6.2500 mg | INTRAMUSCULAR | Status: DC | PRN

## 2014-09-28 MED ORDER — METOPROLOL TARTRATE 12.5 MG HALF TABLET
12.5000 mg | ORAL_TABLET | Freq: Two times a day (BID) | ORAL | Status: DC
  Administered 2014-09-28: 12.5 mg via ORAL
  Filled 2014-09-28 (×3): qty 1

## 2014-09-28 MED ORDER — MAGNESIUM SULFATE 4 GM/100ML IV SOLN
INTRAVENOUS | Status: AC
  Filled 2014-09-28: qty 100

## 2014-09-28 MED ORDER — CHLORHEXIDINE GLUCONATE 4 % EX LIQD: 30.0000 mL | CUTANEOUS | Status: DC

## 2014-09-28 MED ORDER — INSULIN ASPART 100 UNIT/ML ~~LOC~~ SOLN
0.0000 [IU] | SUBCUTANEOUS | Status: DC
  Administered 2014-09-28: 2 [IU] via SUBCUTANEOUS

## 2014-09-28 MED ORDER — LACTATED RINGERS IV SOLN: 500.0000 mL | Freq: Once | INTRAVENOUS | Status: AC | PRN

## 2014-09-28 MED ORDER — ACETAMINOPHEN 650 MG RE SUPP
650.0000 mg | Freq: Once | RECTAL | Status: AC
  Administered 2014-09-28: 650 mg via RECTAL
  Filled 2014-09-28: qty 1

## 2014-09-28 MED ORDER — METOPROLOL TARTRATE 1 MG/ML IV SOLN: 2.5000 mg | INTRAVENOUS | Status: DC | PRN

## 2014-09-28 MED ORDER — DEXTROSE 5 % IV SOLN
20.0000 mg | INTRAVENOUS | Status: DC | PRN
  Administered 2014-09-28: 20 ug/min via INTRAVENOUS

## 2014-09-28 MED ORDER — ALBUMIN HUMAN 5 % IV SOLN
250.0000 mL | INTRAVENOUS | Status: DC | PRN
Start: 2014-09-28 — End: 2014-09-29

## 2014-09-28 MED ORDER — METOPROLOL TARTRATE 25 MG/10 ML ORAL SUSPENSION
12.5000 mg | Freq: Two times a day (BID) | ORAL | Status: DC
  Filled 2014-09-28 (×3): qty 5

## 2014-09-28 MED ORDER — GLYCOPYRROLATE 0.2 MG/ML IJ SOLN
INTRAMUSCULAR | Status: DC | PRN
  Administered 2014-09-28: 0.6 mg via INTRAVENOUS

## 2014-09-28 MED ORDER — FENTANYL CITRATE (PF) 100 MCG/2ML IJ SOLN
INTRAMUSCULAR | Status: DC | PRN
  Administered 2014-09-28 (×4): 50 ug via INTRAVENOUS

## 2014-09-28 MED ORDER — HEPARIN SODIUM (PORCINE) 1000 UNIT/ML IJ SOLN
INTRAMUSCULAR | Status: DC | PRN
  Administered 2014-09-28: 15000 [IU] via INTRAVENOUS

## 2014-09-28 MED ORDER — LIDOCAINE HCL (CARDIAC) 20 MG/ML IV SOLN
INTRAVENOUS | Status: DC | PRN
  Administered 2014-09-28: 20 mg via INTRAVENOUS
  Administered 2014-09-28: 70 mg via INTRAVENOUS

## 2014-09-28 MED ORDER — SODIUM CHLORIDE 0.9 % IV SOLN
INTRAVENOUS | Status: DC | PRN
  Administered 2014-09-28: 08:00:00 via INTRAVENOUS

## 2014-09-28 MED ORDER — LEVOTHYROXINE SODIUM 150 MCG PO TABS
150.0000 ug | ORAL_TABLET | Freq: Every day | ORAL | Status: DC
Start: 2014-09-29 — End: 2014-10-02
  Administered 2014-09-29 – 2014-10-02 (×4): 150 ug via ORAL
  Filled 2014-09-28 (×5): qty 1

## 2014-09-28 MED ORDER — VANCOMYCIN HCL IN DEXTROSE 1-5 GM/200ML-% IV SOLN
1000.0000 mg | Freq: Once | INTRAVENOUS | Status: AC
  Administered 2014-09-28: 1000 mg via INTRAVENOUS
  Filled 2014-09-28: qty 200

## 2014-09-28 MED ORDER — POTASSIUM CHLORIDE 10 MEQ/50ML IV SOLN
10.0000 meq | INTRAVENOUS | Status: AC
  Administered 2014-09-28 (×3): 10 meq via INTRAVENOUS
  Filled 2014-09-28: qty 50

## 2014-09-28 MED ORDER — DEXTROSE 5 % IV SOLN
1.5000 g | Freq: Two times a day (BID) | INTRAVENOUS | Status: AC
  Administered 2014-09-28 – 2014-09-30 (×4): 1.5 g via INTRAVENOUS
  Filled 2014-09-28 (×6): qty 1.5

## 2014-09-28 MED ORDER — MIDAZOLAM HCL 2 MG/2ML IJ SOLN: 2.0000 mg | INTRAMUSCULAR | Status: DC | PRN

## 2014-09-28 MED ORDER — INSULIN REGULAR BOLUS VIA INFUSION
0.0000 [IU] | Freq: Three times a day (TID) | INTRAVENOUS | Status: DC
  Filled 2014-09-28: qty 10

## 2014-09-28 MED ORDER — ONDANSETRON HCL 4 MG/2ML IJ SOLN: 4.0000 mg | Freq: Four times a day (QID) | INTRAMUSCULAR | Status: DC | PRN

## 2014-09-28 MED ORDER — FENTANYL CITRATE (PF) 250 MCG/5ML IJ SOLN
INTRAMUSCULAR | Status: AC
  Filled 2014-09-28: qty 5

## 2014-09-28 MED ORDER — DEXMEDETOMIDINE HCL IN NACL 400 MCG/100ML IV SOLN
INTRAVENOUS | Status: DC | PRN
  Administered 2014-09-28: .4 ug/kg/h via INTRAVENOUS

## 2014-09-28 MED ORDER — MAGNESIUM SULFATE 4 GM/100ML IV SOLN
4.0000 g | Freq: Once | INTRAVENOUS | Status: AC
  Administered 2014-09-28: 4 g via INTRAVENOUS
  Filled 2014-09-28: qty 100

## 2014-09-28 MED ORDER — NITROGLYCERIN IN D5W 200-5 MCG/ML-% IV SOLN
0.0000 ug/min | INTRAVENOUS | Status: DC
Start: 2014-09-28 — End: 2014-09-29

## 2014-09-28 MED ORDER — PROPOFOL 10 MG/ML IV BOLUS
INTRAVENOUS | Status: AC
  Filled 2014-09-28: qty 20

## 2014-09-28 MED ORDER — OXYCODONE HCL 5 MG PO TABS: 5.0000 mg | ORAL_TABLET | ORAL | Status: DC | PRN

## 2014-09-28 MED ORDER — IODIXANOL 320 MG/ML IV SOLN
INTRAVENOUS | Status: DC | PRN
  Administered 2014-09-28: 101.7 mL via INTRA_ARTERIAL

## 2014-09-28 MED ORDER — NOREPINEPHRINE BITARTRATE 1 MG/ML IV SOLN
4000.0000 ug | INTRAVENOUS | Status: DC | PRN
  Administered 2014-09-28: 2 ug/min via INTRAVENOUS

## 2014-09-28 MED ORDER — LACTATED RINGERS IV SOLN
INTRAVENOUS | Status: DC
  Administered 2014-09-28: 12:00:00 via INTRAVENOUS

## 2014-09-28 MED ORDER — ASPIRIN 81 MG PO CHEW: 324.0000 mg | CHEWABLE_TABLET | Freq: Every day | ORAL | Status: DC

## 2014-09-28 MED ORDER — SODIUM CHLORIDE 0.9 % IV SOLN
INTRAVENOUS | Status: DC
  Administered 2014-09-28: 12:00:00 via INTRAVENOUS

## 2014-09-28 MED ORDER — DEXMEDETOMIDINE HCL IN NACL 200 MCG/50ML IV SOLN: 0.1000 ug/kg/h | INTRAVENOUS | Status: DC

## 2014-09-28 MED ORDER — ROCURONIUM BROMIDE 100 MG/10ML IV SOLN
INTRAVENOUS | Status: DC | PRN
  Administered 2014-09-28: 40 mg via INTRAVENOUS
  Administered 2014-09-28: 10 mg via INTRAVENOUS

## 2014-09-28 SURGICAL SUPPLY — 103 items
ADH SKN CLS APL DERMABOND .7 (GAUZE/BANDAGES/DRESSINGS) ×2
ATTRACTOMAT 16X20 MAGNETIC DRP (DRAPES) IMPLANT
BAG BANDED W/RUBBER/TAPE 36X54 (MISCELLANEOUS) ×4 IMPLANT
BAG DECANTER FOR FLEXI CONT (MISCELLANEOUS) IMPLANT
BAG EQP BAND 135X91 W/RBR TAPE (MISCELLANEOUS) ×2
BAG SNAP BAND KOVER 36X36 (MISCELLANEOUS) ×8 IMPLANT
BLADE 10 SAFETY STRL DISP (BLADE) ×4 IMPLANT
BLADE STERNUM SYSTEM 6 (BLADE) ×4 IMPLANT
BLADE SURG ROTATE 9660 (MISCELLANEOUS) IMPLANT
CABLE PACING FASLOC BIEGE (MISCELLANEOUS) ×4 IMPLANT
CABLE PACING FASLOC BLUE (MISCELLANEOUS) ×4 IMPLANT
CANISTER SUCTION 2500CC (MISCELLANEOUS) IMPLANT
CANNULA FEM VENOUS REMOTE 22FR (CANNULA) IMPLANT
CANNULA OPTISITE PERFUSION 16F (CANNULA) IMPLANT
CANNULA OPTISITE PERFUSION 18F (CANNULA) IMPLANT
CATH EXPO 5FR FR4 (CATHETERS) ×4 IMPLANT
CATH S G BIP PACING (SET/KITS/TRAYS/PACK) ×8 IMPLANT
CLIP TI MEDIUM 24 (CLIP) ×4 IMPLANT
CLIP TI WIDE RED SMALL 24 (CLIP) ×4 IMPLANT
CONT SPEC 4OZ CLIKSEAL STRL BL (MISCELLANEOUS) ×12 IMPLANT
COVER DOME SNAP 22 D (MISCELLANEOUS) ×4 IMPLANT
COVER MAYO STAND STRL (DRAPES) ×4 IMPLANT
COVER PROBE W GEL 5X96 (DRAPES) ×8 IMPLANT
COVER TABLE BACK 60X90 (DRAPES) ×4 IMPLANT
CRADLE DONUT ADULT HEAD (MISCELLANEOUS) ×4 IMPLANT
DERMABOND ADVANCED (GAUZE/BANDAGES/DRESSINGS) ×2
DERMABOND ADVANCED .7 DNX12 (GAUZE/BANDAGES/DRESSINGS) ×2 IMPLANT
DRAPE INCISE IOBAN 66X45 STRL (DRAPES) IMPLANT
DRAPE SLUSH MACHINE 52X66 (DRAPES) ×4 IMPLANT
DRAPE TABLE COVER HEAVY DUTY (DRAPES) ×4 IMPLANT
DRSG TEGADERM 4X4.75 (GAUZE/BANDAGES/DRESSINGS) ×4 IMPLANT
EDWARDS UNIVERSAL ADAPTER (HOOKS UP TO PACING WIRES) ×4 IMPLANT
ELECT REM PT RETURN 9FT ADLT (ELECTROSURGICAL) ×8
ELECTRODE REM PT RTRN 9FT ADLT (ELECTROSURGICAL) ×4 IMPLANT
FELT TEFLON 6X6 (MISCELLANEOUS) ×4 IMPLANT
FEMORAL VENOUS CANN RAP (CANNULA) IMPLANT
GAUZE SPONGE 2X2 8PLY STRL LF (GAUZE/BANDAGES/DRESSINGS) ×2 IMPLANT
GAUZE SPONGE 4X4 12PLY STRL (GAUZE/BANDAGES/DRESSINGS) ×4 IMPLANT
GAUZE SPONGE 4X4 16PLY XRAY LF (GAUZE/BANDAGES/DRESSINGS) ×4 IMPLANT
GLOVE BIO SURGEON STRL SZ7 (GLOVE) ×4 IMPLANT
GLOVE BIOGEL PI IND STRL 6.5 (GLOVE) ×8 IMPLANT
GLOVE BIOGEL PI INDICATOR 6.5 (GLOVE) ×8
GLOVE ECLIPSE 7.5 STRL STRAW (GLOVE) ×4 IMPLANT
GLOVE ECLIPSE 8.0 STRL XLNG CF (GLOVE) ×8 IMPLANT
GLOVE EUDERMIC 7 POWDERFREE (GLOVE) ×4 IMPLANT
GLOVE ORTHO TXT STRL SZ7.5 (GLOVE) ×4 IMPLANT
GOWN STRL REUS W/ TWL LRG LVL3 (GOWN DISPOSABLE) ×6 IMPLANT
GOWN STRL REUS W/ TWL XL LVL3 (GOWN DISPOSABLE) ×12 IMPLANT
GOWN STRL REUS W/TWL LRG LVL3 (GOWN DISPOSABLE) ×12
GOWN STRL REUS W/TWL XL LVL3 (GOWN DISPOSABLE) ×24
GUIDEWIRE SAF TJ AMPL .035X180 (WIRE) ×4 IMPLANT
GUIDEWIRE SAFE TJ AMPLATZ EXST (WIRE) ×8 IMPLANT
GUIDEWIRE STRAIGHT .035 260CM (WIRE) ×4 IMPLANT
INSERT FOGARTY 61MM (MISCELLANEOUS) ×4 IMPLANT
INSERT FOGARTY SM (MISCELLANEOUS) ×8 IMPLANT
INSERT FOGARTY XLG (MISCELLANEOUS) IMPLANT
KIT BASIN OR (CUSTOM PROCEDURE TRAY) ×4 IMPLANT
KIT DILATOR VASC 18G NDL (KITS) IMPLANT
KIT HEART LEFT (KITS) ×4 IMPLANT
KIT ROOM TURNOVER OR (KITS) ×4 IMPLANT
KIT SUCTION CATH 14FR (SUCTIONS) ×8 IMPLANT
NEEDLE PERC 18GX7CM (NEEDLE) ×4 IMPLANT
NS IRRIG 1000ML POUR BTL (IV SOLUTION) ×12 IMPLANT
PACK AORTA (CUSTOM PROCEDURE TRAY) ×4 IMPLANT
PAD ARMBOARD 7.5X6 YLW CONV (MISCELLANEOUS) ×8 IMPLANT
PAD ELECT DEFIB RADIOL ZOLL (MISCELLANEOUS) ×4 IMPLANT
PATCH TACHOSII LRG 9.5X4.8 (VASCULAR PRODUCTS) IMPLANT
SHEATH BRITE TIP 6FR 35CM (SHEATH) ×4 IMPLANT
SHEATH HIGHFLEX ANSEL 6FRX55 (SHEATH) ×4 IMPLANT
SHEATH PINNACLE 6F 10CM (SHEATH) ×8 IMPLANT
SPONGE GAUZE 2X2 STER 10/PKG (GAUZE/BANDAGES/DRESSINGS) ×2
SPONGE LAP 4X18 X RAY DECT (DISPOSABLE) IMPLANT
STOPCOCK MORSE 400PSI 3WAY (MISCELLANEOUS) ×16 IMPLANT
SUT ETHIBOND X763 2 0 SH 1 (SUTURE) ×4 IMPLANT
SUT GORETEX CV 4 TH 22 36 (SUTURE) ×4 IMPLANT
SUT GORETEX CV4 TH-18 (SUTURE) ×12 IMPLANT
SUT GORETEX TH-18 36 INCH (SUTURE) ×8 IMPLANT
SUT MNCRL AB 3-0 PS2 18 (SUTURE) ×4 IMPLANT
SUT PROLENE 3 0 SH1 36 (SUTURE) IMPLANT
SUT PROLENE 4 0 RB 1 (SUTURE) ×4
SUT PROLENE 4 0 SH DA (SUTURE) ×20 IMPLANT
SUT PROLENE 4-0 RB1 .5 CRCL 36 (SUTURE) ×2 IMPLANT
SUT PROLENE 5 0 C 1 36 (SUTURE) ×8 IMPLANT
SUT PROLENE 6 0 C 1 30 (SUTURE) ×8 IMPLANT
SUT SILK  1 MH (SUTURE) ×2
SUT SILK 1 MH (SUTURE) ×2 IMPLANT
SUT SILK 2 0 SH CR/8 (SUTURE) IMPLANT
SUT VIC AB 2-0 CT1 27 (SUTURE) ×4
SUT VIC AB 2-0 CT1 TAPERPNT 27 (SUTURE) ×2 IMPLANT
SUT VIC AB 2-0 CTX 36 (SUTURE) IMPLANT
SUT VIC AB 3-0 SH 8-18 (SUTURE) ×16 IMPLANT
SYR 130ML INJECTION CT SYSTEM (MISCELLANEOUS) ×4 IMPLANT
SYR 30ML LL (SYRINGE) ×8 IMPLANT
SYR 50ML LL SCALE MARK (SYRINGE) ×4 IMPLANT
TAPE CLOTH SURG 4X10 WHT LF (GAUZE/BANDAGES/DRESSINGS) ×4 IMPLANT
TOWEL OR 17X26 10 PK STRL BLUE (TOWEL DISPOSABLE) ×8 IMPLANT
TRANSDUCER W/STOPCOCK (MISCELLANEOUS) ×12 IMPLANT
TRAY FOLEY IC TEMP SENS 14FR (CATHETERS) ×4 IMPLANT
TUBE SUCT INTRACARD DLP 20F (MISCELLANEOUS) IMPLANT
TUBING HIGH PRESSURE 120CM (CONNECTOR) ×4 IMPLANT
VALVE HEART TRANSCATH SZ3 26MM (Prosthesis & Implant Heart) ×4 IMPLANT
WIRE AMPLATZ SS-J .035X180CM (WIRE) ×4 IMPLANT
WIRE J 3MM .035X145CM (WIRE) ×4 IMPLANT

## 2014-09-28 NOTE — Interval H&P Note (Signed)
History and Physical Interval Note:  09/28/2014 7:02 AM  Danielle DessIrene C Cihlar  has presented today for surgery, with the diagnosis of SEVERE AS  The various methods of treatment have been discussed with the patient and family. After consideration of risks, benefits and other options for treatment, the patient has consented to  Procedure(s): TRANSCATHETER AORTIC VALVE REPLACEMENT, TRANSFEMORAL (N/A) TRANSESOPHAGEAL ECHOCARDIOGRAM (TEE) (N/A) as a surgical intervention .  The patient's history has been reviewed, patient examined, no change in status, stable for surgery.  I have reviewed the patient's chart and labs.  Questions were answered to the patient's satisfaction.     Purcell Nailslarence H Rene Sizelove

## 2014-09-28 NOTE — Transfer of Care (Signed)
Immediate Anesthesia Transfer of Care Note  Patient: Jamie Howell  Procedure(s) Performed: Procedure(s): TRANSCATHETER AORTIC VALVE REPLACEMENT, TRANSFEMORAL (N/A) TRANSESOPHAGEAL ECHOCARDIOGRAM (TEE) (N/A)  Patient Location: ICU  Anesthesia Type:General  Level of Consciousness: awake, alert and oriented  Airway & Oxygen Therapy: Patient Spontanous Breathing and Patient connected to nasal cannula oxygen  Post-op Assessment: Report given to RN and Post -op Vital signs reviewed and stable  Post vital signs: Reviewed  Last Vitals:  Filed Vitals:   09/28/14 0612  BP: 122/54  Pulse: 82  Temp: 36.8 C  Resp: 18    Complications: No apparent anesthesia complications

## 2014-09-28 NOTE — Anesthesia Postprocedure Evaluation (Signed)
  Anesthesia Post-op Note  Patient: Jamie Howell  Procedure(s) Performed: Procedure(s): TRANSCATHETER AORTIC VALVE REPLACEMENT, TRANSFEMORAL (N/A) TRANSESOPHAGEAL ECHOCARDIOGRAM (TEE) (N/A)  Patient Location: ICU  Anesthesia Type:General  Level of Consciousness: awake and sedated  Airway and Oxygen Therapy: Patient Spontanous Breathing  Post-op Pain: mild  Post-op Assessment: Post-op Vital signs reviewed, Patient's Cardiovascular Status Stable and Respiratory Function Stable              Post-op Vital Signs: stable  Last Vitals:  Filed Vitals:   09/28/14 1400  BP: 113/54  Pulse: 71  Temp: 35.9 C  Resp: 16    Complications: No apparent anesthesia complications

## 2014-09-28 NOTE — Progress Notes (Signed)
Pt doing well. Awake and alert. Hemodynamics look good. No pressors. Will dc swan and art line and mobilize.  Tonny BollmanCooper, Ange Puskas 09/28/2014 3:29 PM

## 2014-09-28 NOTE — Op Note (Signed)
CARDIOTHORACIC SURGERY OPERATIVE NOTE  Date of Procedure:  09/28/2014  Preoperative Diagnosis: Severe Aortic Stenosis   Postoperative Diagnosis: Same   Procedure:    Transcatheter Aortic Valve Replacement - Open Left Transfemoral Approach  Edwards Sapien 3 Transcatheter Heart Valve (size 26 mm, model # 9600TFX, serial # [id number])   Co-Surgeons:  Salvatore Decentlarence H. Cornelius Moraswen, MD and Tonny BollmanMichael Cooper, MD  Assistants:   Verne Carrowhristopher McAlhany, MD  Anesthesiologist:  Sharee Holstererry Massagee, MD  Echocardiographer:  Tobias AlexanderKatarina Nelson, MD  Pre-operative Echo Findings:  Severe aortic stenosis  Mild aortic insufficiency  Mild left ventricular systolic dysfunction  Post-operative Echo Findings:  Trivial paravalvular leak  Unchanged left ventricular systolic function      DETAILS OF THE OPERATIVE PROCEDURE  The majority of the procedure is documented separately in a procedure note by Dr. Excell Seltzerooper.   TRANSFEMORAL ACCESS:   A small incision is made in the left groin immediately over the common femoral artery. The subcutaneous tissues are divided with electrocautery.  Careful dissection is performed to unroof a large venous varicosity located immediately anterior to the femoral triangle in association with the greater saphenous vein. Dissection is continued cephalad to the varicosity and the anterior surface of the common femoral artery is identified. During the dissection a feeding venous vessel to the varicosity is torn and subsequently repaired using pledgeted Prolene sutures.  Sharp dissection is utilized to free up the femoral artery proximally and distally.  A pair of CV-4 Gore-tex sutures are place as diamond-shaped purse-strings on the anterior surface of the femoral artery.  The patient is heparinized systemically and ACT verified > 250 seconds.  The common femoral artery is punctured using an 18 gauge needle and a soft J-tipped guidewire is passed into the common iliac artery under fluoroscopic  guidance.  A 6 Fr straight diagnostic catheter is placed over the guidewire and the guidewire is removed.  An Amplatz super stiff guidewire is passed through the sheath into the descending thoracic aorta and the introducing diagnostic catheter is removed.  Serial dilators are passed over the guidewire under continuous fluoroscopic guidance, making certain that each dilator passes easily all of the way into the distal abdominal aorta.  A 14 Fr Commander introducer sheath is passed over the guidewire into the abdominal aorta.  The introducing dilator is removed, the sheath is flushed with heparinized saline, and the sheath is secured to the skin.    FEMORAL SHEATH REMOVAL AND ARTERIAL CLOSURE:  After the completion of successful valve deployment as documented separately by Dr. Excell Seltzerooper, the femoral artery sheath is removed and the arteriotomy is closed using the previously placed Gore-tex purse-string sutures. Once the repair has been completed protamine was administered to reverse the anticoagulation.  The incision is irrigated with saline solution and subsequently closed in multiple layers using absorbable suture.  The skin incision is closed using a subcuticular skin closure.     Salvatore Decentlarence H. Cornelius Moraswen MD 09/28/2014 10:58 AM

## 2014-09-28 NOTE — Progress Notes (Signed)
UR completed.    Ammy Lienhard W. Roxine Whittinghill, RN, BSN  Trauma/Neuro ICU Case Manager [phone] 

## 2014-09-28 NOTE — Progress Notes (Signed)
  Echocardiogram 2D Echocardiogram has been performed.  Jamie Howell, Jamie Howell R 09/28/2014, 10:31 AM

## 2014-09-28 NOTE — Anesthesia Procedure Notes (Signed)
Procedure Name: Intubation Date/Time: 09/28/2014 7:47 AM Performed by: Suzy Bouchard Pre-anesthesia Checklist: Patient identified, Emergency Drugs available, Suction available, Patient being monitored and Timeout performed Patient Re-evaluated:Patient Re-evaluated prior to inductionOxygen Delivery Method: Circle system utilized Preoxygenation: Pre-oxygenation with 100% oxygen Intubation Type: IV induction Ventilation: Mask ventilation without difficulty and Oral airway inserted - appropriate to patient size Laryngoscope Size: Miller and 2 Grade View: Grade I Tube size: 7.0 mm Number of attempts: 1 Airway Equipment and Method: Stylet and LTA kit utilized Placement Confirmation: ETT inserted through vocal cords under direct vision,  positive ETCO2 and CO2 detector Secured at: 20 cm Tube secured with: Tape Dental Injury: Teeth and Oropharynx as per pre-operative assessment

## 2014-09-28 NOTE — Op Note (Signed)
[facility]  TAVR OPERATIVE NOTE   Date of Procedure:  09/28/2014  Preoperative Diagnosis: Severe Aortic Stenosis   Postoperative Diagnosis: Same   Procedure:   Transcatheter Aortic Valve Replacement - Transfemoral Approach  Edwards Sapien 3 THV (size 26 mm, model # 9600TFX, serial # [id number])   Co-Surgeons:  Salvatore Decent. Cornelius Moras, MD and Tonny Bollman, MD  Assistants:   Verne Carrow, MD  Anesthesiologist:  Dr Jacklynn Bue  Echocardiographer:  Dr Delton See  Pre-operative Echo Findings:  Severe aortic stenosis  Normal left ventricular systolic function  Mild MR  Post-operative Echo Findings:  No paravalvular leak  Normal left ventricular systolic function  Mild MR  BRIEF CLINICAL NOTE AND INDICATIONS FOR SURGERY Patient is a 64 year old woman with a known history of long-standing rheumatic heart disease who underwent mitral valve replacement in 1993 with a St. Jude mechanical valve for severe MS. For the last several years she has been followed by Dr. Mayford Knife. Echocardiograms have demonstrated the presence of stable left ventricular systolic function with normal functioning mechanical mitral valve prosthesis, but the patient has developed progressive aortic stenosis. Because of multiple comorbidities (hepatic cirrhosis, thrombocytopenia, varices, previous cardiac surgery), she presents today for TAVR for treatment of severe symptomatic aortic stenosis.  During the course of the patient's preoperative work up they have been evaluated comprehensively by a multidisciplinary team of specialists coordinated through the Multidisciplinary [facility]ve Clinic in the [facility].  They have been demonstrated to suffer from symptomatic severe aortic stenosis as noted above. The patient has been counseled extensively as to the relative risks and benefits of all options for the treatment of severe aortic stenosis including long term medical therapy,  conventional surgery for aortic valve replacement, and transcatheter aortic valve replacement.  The patient has been independently evaluated by two cardiac surgeons including Dr Cornelius Moras and Dr. Laneta Simmers, and they are felt to be at high risk for conventional surgical aortic valve replacement. Both surgeons indicated the patient would be a poor candidate for conventional surgery (predicted risk of mortality >15% and/or predicted risk of permanent morbidity >50%) because of comorbidities including hepatic cirrhosis, thrombocytopenia, varices, previous cardiac surgery.   Based upon review of all of the patient's preoperative diagnostic tests they are felt to be candidate for transcatheter aortic valve replacement using the transfemoral approach as an alternative to high risk conventional surgery.    Following the decision to proceed with transcatheter aortic valve replacement, a discussion has been held regarding what types of management strategies would be attempted intraoperatively in the event of life-threatening complications, including whether or not the patient would be considered a candidate for the use of cardiopulmonary bypass and/or conversion to open sternotomy for attempted surgical intervention.  The patient has been advised of a variety of complications that might develop peculiar to this approach including but not limited to risks of death, stroke, paravalvular leak, aortic dissection or other major vascular complications, aortic annulus rupture, device embolization, cardiac rupture or perforation, acute myocardial infarction, arrhythmia, heart block or bradycardia requiring permanent pacemaker placement, congestive heart failure, respiratory failure, renal failure, pneumonia, infection, other late complications related to structural valve deterioration or migration, or other complications that might ultimately cause a temporary or permanent loss of functional independence or other long term morbidity.  The  patient provides full informed consent for the procedure as described and all questions were answered preoperatively.    DETAILS OF THE OPERATIVE PROCEDURE  PREPARATION:   The patient is brought  to the operating room on the above mentioned date and central monitoring was established by the anesthesia team including placement of Swan-Ganz catheter and radial arterial line. The patient is placed in the supine position on the operating table.  Intravenous antibiotics are administered. General endotracheal anesthesia is induced uneventfully. A Foley catheter is placed.  Baseline transthoracic echocardiogram was performed. TEE was not done because of the presence of esophageal varices. The patient's chest, abdomen, both groins, and both lower extremities are prepared and draped in a sterile manner. A time out procedure is performed.  PERIPHERAL ACCESS:   Using the modified Seldinger technique, femoral arterial and venous access was obtained with placement of 6 Fr sheaths on the right side.  A pigtail diagnostic catheter was passed through the right femoral arterial sheath under fluoroscopic guidance into the aortic root.  A temporary transvenous pacemaker catheter was passed through the right femoral venous sheath under fluoroscopic guidance into the right ventricle.  This was very difficult to position at the RV apex. Ultimately a 6 Fr Ansel Sheath was placed in the RA to direct the catheter across the tricuspid valve into the RV apex. The pacemaker was tested to ensure stable lead placement and pacemaker capture. Aortic root angiography was performed in order to determine the optimal angiographic angle for valve deployment.   TRANSFEMORAL ACCESS:  A left femoral arterial cutdown was performed by Dr Cornelius Moraswen. Please see his separate operative note for details. The patient was heparinized systemically and ACT verified > 250 seconds.    A 14 Fr transfemoral E-sheath was introduced into the left femoral  artery after progressively dilating over an Amplatz superstiff wire. An AL-1 catheter was used to direct a straight-tip exchange length wire across the native aortic valve into the left ventricle. This was exchanged out for a pigtail catheter and position was confirmed in the LV apex. Simultaneous LV and Ao pressures were recorded.  The pigtail catheter was then exchanged for an Amplatz Extra-stiff wire in the LV apex. At that point, BAV was performed using a 23 mm valvuloplasty balloon.  Once optimal position was achieved, BAV was done under rapid ventricular pacing at 180 bpm. The patient recovered well hemodynamically.   TRANSCATHETER HEART VALVE DEPLOYMENT:  An Edwards Sapien 3 THV (size 26 mm) was prepared and crimped per manufacturer's guidelines, and the proper orientation of the valve is confirmed on the [employer] Care[employer]wards Commander delivery system. The valve was advanced through the introducer sheath using normal technique until in an appropriate position in the abdominal aorta beyond the sheath tip. The balloon was then retracted and using the fine-tuning wheel was centered on the valve. The valve was then advanced across the aortic arch using appropriate flexion of the catheter. The valve was carefully positioned across the aortic valve annulus. The Commander catheter was retracted using normal technique. Once final position of the valve has been confirmed by angiographic assessment, the valve is deployed while temporarily holding ventilation and during rapid ventricular pacing to maintain systolic blood pressure < 50 mmHg and pulse pressure < 10 mmHg. The balloon inflation is held for >3 seconds after reaching full deployment volume. Once the balloon has fully deflated the balloon is retracted into the ascending aorta and valve function is assessed using TTE. There is felt to be no paravalvular leak and no central aortic insufficiency.  The patient's hemodynamic recovery following valve deployment is good.  The  deployment balloon and guidewire are both removed. Echo demostrated acceptable post-procedural gradients, stable mitral valve  function, and no AI.   PROCEDURE COMPLETION:  The sheath was then removed and arteriotomy repaired by Dr Cornelius Moras. Please see his separate report for details. Protamine was administered once femoral arterial repair was complete. The temporary pacemaker, pigtail catheters and femoral sheaths were removed with manual pressure used for hemostasis.   The patient tolerated the procedure well and is transported to the surgical intensive care in stable condition. There were no immediate intraoperative complications. All sponge instrument and needle counts are verified correct at completion of the operation.   The patient received a total of 102 mL of intravenous contrast during the procedure.  Tonny Bollman MD 09/28/2014 10:25 AM

## 2014-09-29 ENCOUNTER — Inpatient Hospital Stay (HOSPITAL_COMMUNITY): Payer: 59

## 2014-09-29 ENCOUNTER — Encounter (HOSPITAL_COMMUNITY): Payer: Self-pay | Admitting: Cardiovascular Disease

## 2014-09-29 DIAGNOSIS — I359 Nonrheumatic aortic valve disorder, unspecified: Secondary | ICD-10-CM

## 2014-09-29 DIAGNOSIS — Z954 Presence of other heart-valve replacement: Secondary | ICD-10-CM

## 2014-09-29 DIAGNOSIS — I350 Nonrheumatic aortic (valve) stenosis: Secondary | ICD-10-CM

## 2014-09-29 LAB — BASIC METABOLIC PANEL
ANION GAP: 9 (ref 5–15)
BUN: 14 mg/dL (ref 6–20)
CO2: 25 mmol/L (ref 22–32)
Calcium: 8.1 mg/dL — ABNORMAL LOW (ref 8.9–10.3)
Chloride: 99 mmol/L — ABNORMAL LOW (ref 101–111)
Creatinine, Ser: 0.78 mg/dL (ref 0.44–1.00)
GFR calc non Af Amer: >60 mL/min (ref >60)
Glucose, Bld: 108 mg/dL — ABNORMAL HIGH (ref 65–99)
POTASSIUM: 4.3 mmol/L (ref 3.5–5.1)
SODIUM: 133 mmol/L — AB (ref 135–145)

## 2014-09-29 LAB — GLUCOSE, CAPILLARY: Glucose-Capillary: 90 mg/dL (ref 65–99)

## 2014-09-29 LAB — CBC
HEMATOCRIT: 25.6 % — AB (ref 36.0–46.0)
HEMOGLOBIN: 8.3 g/dL — AB (ref 12.0–15.0)
MCH: 27.7 pg (ref 26.0–34.0)
MCHC: 32.4 g/dL (ref 30.0–36.0)
MCV: 85.3 fL (ref 78.0–100.0)
Platelets: 44 10*3/uL — ABNORMAL LOW (ref 150–400)
RBC: 3 MIL/uL — ABNORMAL LOW (ref 3.87–5.11)
RDW: 17.8 % — ABNORMAL HIGH (ref 11.5–15.5)
WBC: 4.5 10*3/uL (ref 4.0–10.5)

## 2014-09-29 LAB — MAGNESIUM: Magnesium: 2.4 mg/dL (ref 1.7–2.4)

## 2014-09-29 MED ORDER — CYCLOBENZAPRINE HCL 5 MG PO TABS
5.0000 mg | ORAL_TABLET | Freq: Every day | ORAL | Status: DC
  Administered 2014-09-29 – 2014-10-01 (×3): 5 mg via ORAL
  Filled 2014-09-29 (×4): qty 1

## 2014-09-29 MED ORDER — WARFARIN - PHARMACIST DOSING INPATIENT: Freq: Every day | Status: DC

## 2014-09-29 MED ORDER — WARFARIN SODIUM 5 MG PO TABS
5.0000 mg | ORAL_TABLET | Freq: Once | ORAL | Status: AC
  Administered 2014-09-29: 5 mg via ORAL
  Filled 2014-09-29: qty 1

## 2014-09-29 MED ORDER — METOPROLOL SUCCINATE ER 25 MG PO TB24
25.0000 mg | ORAL_TABLET | Freq: Every day | ORAL | Status: DC
  Administered 2014-09-29 – 2014-09-30 (×2): 25 mg via ORAL
  Filled 2014-09-29 (×2): qty 1

## 2014-09-29 MED ORDER — FUROSEMIDE 40 MG PO TABS
40.0000 mg | ORAL_TABLET | Freq: Two times a day (BID) | ORAL | Status: DC
  Administered 2014-09-29 – 2014-10-02 (×7): 40 mg via ORAL
  Filled 2014-09-29 (×9): qty 1

## 2014-09-29 MED ORDER — POTASSIUM CHLORIDE ER 10 MEQ PO TBCR
10.0000 meq | EXTENDED_RELEASE_TABLET | Freq: Every day | ORAL | Status: DC
  Administered 2014-09-29 – 2014-10-02 (×4): 10 meq via ORAL
  Filled 2014-09-29 (×4): qty 1

## 2014-09-29 MED ORDER — ASPIRIN EC 81 MG PO TBEC
81.0000 mg | DELAYED_RELEASE_TABLET | Freq: Every day | ORAL | Status: DC
  Administered 2014-09-29 – 2014-10-02 (×4): 81 mg via ORAL
  Filled 2014-09-29 (×4): qty 1

## 2014-09-29 NOTE — Progress Notes (Signed)
Patient ambulated 300 ft with wheelchair, patient tolerated walk well, patient back to chair for breakfast, son at bedside visiting, will continue to monitor.  Hermina BartersBOWMAN, Keala Drum M, RN

## 2014-09-29 NOTE — Progress Notes (Signed)
Patient transferred to 2W30, belongings at bedside, SCD's with patient, patient up in chair, patient placed on 2 west tele box, son at bedside, receiving RN at bedside.   Hermina BartersBOWMAN, Christyn Gutkowski M, RN

## 2014-09-29 NOTE — Progress Notes (Signed)
[address]       [place] [id number]             [phone]        CARDIOTHORACIC SURGERY PROGRESS NOTE   R1 Day Post-Op Procedure(s) (LRB): TRANSCATHETER AORTIC VALVE REPLACEMENT, TRANSFEMORAL (N/A) TRANSESOPHAGEAL ECHOCARDIOGRAM (TEE) (N/A)  Subjective: Doing very well and looks good.  Mild soreness in groin.  No SOB  Objective: Vital signs: BP Readings from Last 1 Encounters:  09/29/14 136/61   Pulse Readings from Last 1 Encounters:  09/29/14 94   Resp Readings from Last 1 Encounters:  09/29/14 24   Temp Readings from Last 1 Encounters:  09/29/14 97.7 F (36.5 C) Oral    Hemodynamics: PAP: (39-47)/(19-25) 46/25 mmHg CO:  [4.3 L/min] 4.3 L/min CI:  [2.4 L/min/m2] 2.4 L/min/m2  Physical Exam:  Rhythm:   sinus  Breath sounds: clear  Heart sounds:  RRR w/ mechanical sounds, no murmur  Incisions:  Clean and dry  Abdomen:  Soft, non-distended, non-tender  Extremities:  Warm, well-perfused    Intake/Output from previous day: 06/28 0701 - 06/29 0700 In: 2250 [P.O.:120; I.V.:1580; IV Piggyback:550] Out: 1080 [Urine:980; Blood:100] Intake/Output this shift:    Lab Results:  CBC: Recent Labs  09/28/14 1150 09/29/14 0355  WBC 2.8* 4.5  HGB 8.0* 8.3*  HCT 24.7* 25.6*  PLT 42* 44*    BMET:  Recent Labs  09/28/14 1022 09/28/14 1131 09/29/14 0355  NA 136 137 133*  K 4.2 3.9 4.3  CL 101  --  99*  CO2  --   --  25  GLUCOSE 154* 138* 108*  BUN 15  --  14  CREATININE 0.80  --  0.78  CALCIUM  --   --  8.1*     PT/INR:   Recent Labs  09/28/14 1150  LABPROT 18.1*  INR 1.49    CBG (last 3)   Recent Labs  09/28/14 1923 09/28/14 2342 09/29/14 0342  GLUCAP 139* 114* 90    ABG    Component Value Date/Time   PHART 7.374 09/28/2014 1132   PCO2ART 39.7 09/28/2014 1132   PO2ART 68.0* 09/28/2014 1132   HCO3 23.3 09/28/2014 1132   TCO2 24 09/28/2014 1132   ACIDBASEDEF 2.0 09/28/2014 1132   O2SAT 93.0 09/28/2014  1132    CXR: PORTABLE CHEST - 1 VIEW  COMPARISON: Portable chest x-ray of September 28, 2014  FINDINGS: The lungs are adequately inflated. The interstitial markings are slightly less conspicuous today. There remain areas of subsegmental atelectasis bilaterally. The cardiac silhouette is enlarged. The cage of the aortic valve is visible. The right internal jugular Cordis sheath remains though the Swan-Ganz catheter is been removed. There are 8 intact sternal wires. The bony thorax exhibits no acute abnormality.  IMPRESSION: Mild CHF with evidence of some improvement since yesterday's study.   Electronically Signed  By: David SwazilandJordan M.D.  On: 09/29/2014 07:53   Assessment/Plan: S/P Procedure(s) (LRB): TRANSCATHETER AORTIC VALVE REPLACEMENT, TRANSFEMORAL (N/A) TRANSESOPHAGEAL ECHOCARDIOGRAM (TEE) (N/A)  Overall doing well POD1 Rheumatic heart disease s/p MVR w/ mechanical prosthesis in remote past Chronic diastolic CHF PAF - maintaining NSR Acute on chronic anemia Chronic thrombocytopenia Cirrhosis w/ portal hypertension, splenomegaly, esophageal varices Obesity   Agree w/ plans outlined by Dr Excell Seltzerooper  Restart coumadin w/out bridging therapy  Watch platelet count, anemia and for signs of bleeding  Mobilize  Diuresis  I spent in excess of 15 minutes during the conduct of this hospital  encounter and >50% of this time involved direct face-to-face encounter with the patient for counseling and/or coordination of their care.   [name] 09/29/2014 8:36 AM

## 2014-09-29 NOTE — Progress Notes (Signed)
ANTICOAGULATION CONSULT NOTE - Initial Consult  Pharmacy Consult for Warfarin Indication: Mechanical MVR and AF   Allergies  Allergen Reactions  . Adhesive [Tape] Itching    Adhesive tape and backing (pls use elastic bandages with no adhesive)  . Penicillins Rash  . Sulfa Antibiotics Rash    Patient Measurements: Height: 4\' 11"  (149.9 cm) Weight: 184 lb 9.6 oz (83.734 kg) IBW/kg (Calculated) : 43.2  Vital Signs: Temp: 97.7 F (36.5 C) (06/29 0808) Temp Source: Oral (06/29 0808) BP: 110/70 mmHg (06/29 0800) Pulse Rate: 92 (06/29 0847)  Labs:  Recent Labs  09/28/14 0619  09/28/14 0949 09/28/14 1022 09/28/14 1131 09/28/14 1150 09/29/14 0355  HGB  --   < > 10.2* 9.2* 8.2* 8.0* 8.3*  HCT  --   < > 30.0* 27.0* 24.0* 24.7* 25.6*  PLT  --   --   --   --   --  42* 44*  APTT 35  --   --   --   --  37  --   LABPROT 16.5*  --   --   --   --  18.1*  --   INR 1.31  --   --   --   --  1.49  --   CREATININE  --   < > 0.80 0.80  --   --  0.78  < > = values in this interval not displayed.  Estimated Creatinine Clearance: 66.6 mL/min (by C-G formula based on Cr of 0.78).   Medical History: Past Medical History  Diagnosis Date  . HTN (hypertension)   . Thyrotoxicosis      without mention of goiter or other cause, without mention of thyrotoxic crisis or storm  . Hypothyroidism   . 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 Atrial fibrillation   . Atrial flutter   . Heart disease   . Hematoma   . Subdural hematoma   . Pulmonary HTN     mild  . OA (ocular albinism)   . Aortic valve stenosis     moderate aortic valve stenosis with moderate aortic insufficiency, stable MVR, trivial PR, mild TR, severe LAE, grade II-III diastolic dysfunction,mild pulmonary HTN , EF 50% by echo 12/2012  . 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 Chronic  recurrent paroxysmal atrial fibrillation   . Obesity (BMI 30-39.9) 07/08/2014  . Splenomegaly 07/14/2014  . Portal hypertension with esophageal varices 07/14/2014  . Heart murmur   . Temporary low platelet count   . Pneumonia     HX 1 + YR AGO   . 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 acetaminophen  1,000 mg Oral 4 times per day   Or  . acetaminophen (TYLENOL) oral liquid 160 mg/5 mL  1,000 mg Per Tube 4 times per day  . aspirin EC  81 mg Oral Daily  . cefUROXime (ZINACEF)  IV  1.5 g Intravenous Q12H  . cyclobenzaprine  5 mg Oral QHS  . furosemide  40 mg Oral BID  . levothyroxine  150 mcg Oral QAC breakfast  . metoprolol succinate  25 mg Oral Daily  . [START ON 09/30/2014] pantoprazole  40 mg Oral Daily  .  potassium chloride  10 mEq Oral Q lunch   Infusions:  . sodium chloride Stopped (09/28/14 1600)    Assessment: 64 yo F with history of mechanical MVR and PAF admitted on 09/28/2014 for TAVR. Pharmacy has been consulted to restart warfarin which has been on hold since 6/22 prior to surgery. INR yesterday was 1.49. H/H remains low but stable postop, plt also low but stable at 44 (noted chronic thrombocytopenia preop with BL ~65-75). No significant s/s bleeding noted.   Home dose: 2.5 mg PO daily except 5 mg on Wednesday and Friday (verified with patient)   Goal of Therapy:  INR 2.5-3.5 Monitor platelets by anticoagulation protocol: Yes   Plan:  - Warfarin 5 mg PO x 1 tonight - Daily INR - Monitor CBC and for s/s bleeding  Novia Lansberry K. Bonnye Fava, PharmD, BCPS Clinical Pharmacist - Resident Pager: [phone] Pharmacy: [phone] 09/29/2014 8:56 AM

## 2014-09-29 NOTE — Progress Notes (Signed)
Responded to spiritual care consult to assist patient with completing advance directives. Documents done and copies were  made for patient and family. A copy was given to staff for patient's chart.

## 2014-09-29 NOTE — Progress Notes (Signed)
  Echocardiogram 2D Echocardiogram has been performed.  Janalyn HarderWest, Danial Sisley R 09/29/2014, 3:46 PM

## 2014-09-29 NOTE — Discharge Instructions (Signed)
Information on my medicine - Coumadin   (Warfarin)  This medication education was reviewed with me or my healthcare representative as part of my discharge preparation.  The pharmacist that spoke with me during my hospital stay was:  Adora Fridge, [facility]  Why was Coumadin prescribed for you? Coumadin was prescribed for you because you have a blood clot or a medical condition that can cause an increased risk of forming blood clots. Blood clots can cause serious health problems by blocking the flow of blood to the heart, lung, or brain. Coumadin can prevent harmful blood clots from forming. As a reminder your indication for Coumadin is:   Blood Clot Prevention After Heart Valve Surgery  What test will check on my response to Coumadin? While on Coumadin (warfarin) you will need to have an INR test regularly to ensure that your dose is keeping you in the desired range. The INR (international normalized ratio) number is calculated from the result of the laboratory test called prothrombin time (PT).  If an INR APPOINTMENT HAS NOT ALREADY BEEN MADE FOR YOU please schedule an appointment to have this lab work done by your health care provider within 7 days. Your INR goal is usually a number between:  2 to 3 or your provider may give you a more narrow range like 2-2.5.  Ask your health care provider during an office visit what your goal INR is.  What  do you need to  know  About  COUMADIN? Take Coumadin (warfarin) exactly as prescribed by your healthcare provider about the same time each day.  DO NOT stop taking without talking to the doctor who prescribed the medication.  Stopping without other blood clot prevention medication to take the place of Coumadin may increase your risk of developing a new clot or stroke.  Get refills before you run out.  What do you do if you miss a dose? If you miss a dose, take it as soon as you remember on the same day then continue your regularly scheduled regimen the next  day.  Do not take two doses of Coumadin at the same time.  Important Safety Information A possible side effect of Coumadin (Warfarin) is an increased risk of bleeding. You should call your healthcare provider right away if you experience any of the following: ? Bleeding from an injury or your nose that does not stop. ? Unusual colored urine (red or dark brown) or unusual colored stools (red or black). ? Unusual bruising for unknown reasons. ? A serious fall or if you hit your head (even if there is no bleeding).  Some foods or medicines interact with Coumadin (warfarin) and might alter your response to warfarin. To help avoid this: ? Eat a balanced diet, maintaining a consistent amount of Vitamin K. ? Notify your provider about major diet changes you plan to make. ? Avoid alcohol or limit your intake to 1 drink for women and 2 drinks for men per day. (1 drink is 5 oz. wine, 12 oz. beer, or 1.5 oz. liquor.)  Make sure that ANY health care provider who prescribes medication for you knows that you are taking Coumadin (warfarin).  Also make sure the healthcare provider who is monitoring your Coumadin knows when you have started a new medication including herbals and non-prescription products.  Coumadin (Warfarin)  Major Drug Interactions  Increased Warfarin Effect Decreased Warfarin Effect  Alcohol (large quantities) Antibiotics (esp. Septra/Bactrim, Flagyl, Cipro) Amiodarone (Cordarone) Aspirin (ASA) Cimetidine (Tagamet) Megestrol (Megace) NSAIDs (  ibuprofen, naproxen, etc.) °Piroxicam (Feldene) °Propafenone (Rythmol SR) °Propranolol (Inderal) °Isoniazid (INH) °Posaconazole (Noxafil) Barbiturates (Phenobarbital) °Carbamazepine (Tegretol) °Chlordiazepoxide (Librium) °Cholestyramine (Questran) °Griseofulvin °Oral Contraceptives °Rifampin °Sucralfate (Carafate) °Vitamin K  ° °Coumadin® (Warfarin) Major Herbal Interactions  °Increased Warfarin Effect Decreased Warfarin Effect   °Garlic °Ginseng °Ginkgo biloba Coenzyme Q10 °Green tea °St. John’s wort   ° °Coumadin® (Warfarin) FOOD Interactions  °Eat a consistent number of servings per week of foods HIGH in Vitamin K °(1 serving = ½ cup)  °Collards (cooked, or boiled & drained) °Kale (cooked, or boiled & drained) °Mustard greens (cooked, or boiled & drained) °Parsley *serving size only = ¼ cup °Spinach (cooked, or boiled & drained) °Swiss chard (cooked, or boiled & drained) °Turnip greens (cooked, or boiled & drained)  °Eat a consistent number of servings per week of foods MEDIUM-HIGH in Vitamin K °(1 serving = 1 cup)  °Asparagus (cooked, or boiled & drained) °Broccoli (cooked, boiled & drained, or raw & chopped) °Brussel sprouts (cooked, or boiled & drained) *serving size only = ½ cup °Lettuce, raw (green leaf, endive, romaine) °Spinach, raw °Turnip greens, raw & chopped  ° °These websites have more information on Coumadin (warfarin):  www.coumadin.com; °www.ahrq.gov/consumer/coumadin.htm; ° ° ° °

## 2014-09-29 NOTE — Progress Notes (Signed)
Report called to 2W patient to transfer to 2W25.  Hermina BartersBOWMAN, Taqwa Deem M, RN

## 2014-09-29 NOTE — Progress Notes (Signed)
CARDIAC REHAB PHASE I   PRE:  Rate/Rhythm: 84 SR    BP: sitting 116/60    SaO2: 100 RA  MODE:  Ambulation: 400 ft   POST:  Rate/Rhythm: 94 SR with PVC    BP: sitting 110/60     SaO2: 100 RA  Pt moving well. No c/o. Used RW, no assist needed except getting feet into bed after walking. To walk with son later. Will try without RW tomorrow. [id number]   Elissa LovettReeve, Jaydy Fitzhenry TerryKristan CES, ACSM 09/29/2014 1:31 PM

## 2014-09-29 NOTE — Progress Notes (Signed)
DC right IJ per order and protocol; patient on bedrest for 30 minutes; call bell within reach; will continue to monitor.  Hermina BartersBOWMAN, Machai Desmith M, RN

## 2014-09-29 NOTE — Progress Notes (Signed)
Spoke with Dr. Excell Seltzerooper regarding Plavix. Plavix will not be restarted. Dr. Excell Seltzerooper is aware of dose given this am.  Hermina BartersBOWMAN, Ulyses Panico M, RN

## 2014-09-29 NOTE — Progress Notes (Addendum)
    Subjective:  Didn't sleep well. Otherwise doing ok. No CP or shortness of breath. Generalized soreness.  Objective:  Vital Signs in the last 24 hours: Temp:  [96.6 F (35.9 C)-97.7 F (36.5 C)] 97.2 F (36.2 C) (06/29 0353) Pulse Rate:  [55-94] 94 (06/29 0700) Resp:  [10-24] 24 (06/29 0700) BP: (105-136)/(46-70) 136/61 mmHg (06/29 0700) SpO2:  [95 %-100 %] 100 % (06/29 0700) Arterial Line BP: (95-125)/(45-57) 114/55 mmHg (06/28 1530) Weight:  [184 lb 9.6 oz (83.734 kg)] 184 lb 9.6 oz (83.734 kg) (06/29 0500)  Intake/Output from previous day: 06/28 0701 - 06/29 0700 In: 2250 [P.O.:120; I.V.:1580; IV Piggyback:550] Out: 1080 [Urine:980; Blood:100]  Physical Exam: Pt is alert and oriented, NAD HEENT: normal Neck: JVP - normal Lungs: CTA bilaterally CV: RRR with crisp mechanical S1 and soft SEM at the RUSB Abd: soft, NT, Positive BS, no hepatomegaly Ext: no C/C/E, distal pulses intact and equal, bilateral groin sites clear, extensive varicosities Skin: warm/dry no rash   Lab Results:  Recent Labs  09/28/14 1150 09/29/14 0355  WBC 2.8* 4.5  HGB 8.0* 8.3*  PLT 42* 44*    Recent Labs  09/28/14 1022 09/28/14 1131 09/29/14 0355  NA 136 137 133*  K 4.2 3.9 4.3  CL 101  --  99*  CO2  --   --  25  GLUCOSE 154* 138* 108*  BUN 15  --  14  CREATININE 0.80  --  0.78   No results for input(s): TROPONINI in the last 72 hours.  Invalid input(s): CK, MB  Cardiac Studies: 2D Echo pending  EKG: NSR with occasional PVC, nonspecific ST abnormaltiy  Tele: Sinus rhythm  Assessment/Plan:  1. Severe symptomatic aortic stenosis s/p TAVR POD #1 2. Thrombocytopenia, acute on chronic (little change from baseline) 3. Paroxysmal AFib, maintaining sinus rhythm 4. S/p MVR (mechanical valve) 5. 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 Anemia, acute on chronic 7. Chronic diastolic heart failure - CXR reviewed - cardiomegaly noted no significant edema. Resume  lasix 40 mg BID, KDur 10 meq daily, repeat BMET in am.  Resume Toprol XL 25 mg daily (1/2 of home dose) and hold amlodipine. Pt progressing well. Will resume warfarin today. Mobilize and tx to tele. D/C foley and IJ line. Cardiac rehab.   Tonny Bollmanooper, Dontarius Sheley, M.D. 09/29/2014, 7:20 AM

## 2014-09-30 DIAGNOSIS — I480 Paroxysmal atrial fibrillation: Secondary | ICD-10-CM

## 2014-09-30 DIAGNOSIS — I350 Nonrheumatic aortic (valve) stenosis: Secondary | ICD-10-CM

## 2014-09-30 LAB — BASIC METABOLIC PANEL
Anion gap: 11 (ref 5–15)
BUN: 13 mg/dL (ref 6–20)
CALCIUM: 8.1 mg/dL — AB (ref 8.9–10.3)
CO2: 25 mmol/L (ref 22–32)
CREATININE: 0.8 mg/dL (ref 0.44–1.00)
Chloride: 101 mmol/L (ref 101–111)
GFR calc Af Amer: >60 mL/min (ref >60)
GFR calc non Af Amer: >60 mL/min (ref >60)
Glucose, Bld: 131 mg/dL — ABNORMAL HIGH (ref 65–99)
Potassium: 3.7 mmol/L (ref 3.5–5.1)
Sodium: 137 mmol/L (ref 135–145)

## 2014-09-30 LAB — CBC
HCT: 23.8 % — ABNORMAL LOW (ref 36.0–46.0)
Hemoglobin: 7.8 g/dL — ABNORMAL LOW (ref 12.0–15.0)
MCH: 27.9 pg (ref 26.0–34.0)
MCHC: 32.8 g/dL (ref 30.0–36.0)
MCV: 85 fL (ref 78.0–100.0)
Platelets: 36 K/uL — ABNORMAL LOW (ref 150–400)
RBC: 2.8 MIL/uL — ABNORMAL LOW (ref 3.87–5.11)
RDW: 17.8 % — ABNORMAL HIGH (ref 11.5–15.5)
WBC: 3.2 K/uL — ABNORMAL LOW (ref 4.0–10.5)

## 2014-09-30 LAB — PROTIME-INR
INR: 1.43 (ref 0.00–1.49)
Prothrombin Time: 17.6 s — ABNORMAL HIGH (ref 11.6–15.2)

## 2014-09-30 MED ORDER — POTASSIUM CHLORIDE CRYS ER 20 MEQ PO TBCR
30.0000 meq | EXTENDED_RELEASE_TABLET | Freq: Once | ORAL | Status: AC
  Administered 2014-09-30: 30 meq via ORAL
  Filled 2014-09-30: qty 1

## 2014-09-30 MED ORDER — SODIUM CHLORIDE 0.9 % IV SOLN
Freq: Once | INTRAVENOUS | Status: AC
  Administered 2014-09-30: 21:00:00 via INTRAVENOUS

## 2014-09-30 MED ORDER — FERROUS SULFATE 325 (65 FE) MG PO TABS
325.0000 mg | ORAL_TABLET | Freq: Every day | ORAL | Status: DC
  Administered 2014-10-01 – 2014-10-02 (×2): 325 mg via ORAL
  Filled 2014-09-30 (×3): qty 1

## 2014-09-30 MED ORDER — WARFARIN SODIUM 2.5 MG PO TABS
2.5000 mg | ORAL_TABLET | Freq: Once | ORAL | Status: AC
  Administered 2014-09-30: 2.5 mg via ORAL
  Filled 2014-09-30: qty 1

## 2014-09-30 MED ORDER — METOPROLOL SUCCINATE ER 50 MG PO TB24
50.0000 mg | ORAL_TABLET | Freq: Every day | ORAL | Status: DC
  Administered 2014-10-01 – 2014-10-02 (×2): 50 mg via ORAL
  Filled 2014-09-30 (×2): qty 1

## 2014-09-30 MED ORDER — METOPROLOL SUCCINATE ER 25 MG PO TB24
25.0000 mg | ORAL_TABLET | Freq: Once | ORAL | Status: AC
  Administered 2014-09-30: 25 mg via ORAL
  Filled 2014-09-30: qty 1

## 2014-09-30 NOTE — Progress Notes (Signed)
Patient Name: Jamie Howell Date of Encounter: 09/30/2014   SUBJECTIVE  Feels better. Denies chest pain or sob. At times feels racing heart intermittently.   CURRENT MEDS . acetaminophen  1,000 mg Oral 4 times per day  . aspirin EC  81 mg Oral Daily  . cyclobenzaprine  5 mg Oral QHS  . [START ON 10/01/2014] ferrous sulfate  325 mg Oral Q breakfast  . furosemide  40 mg Oral BID  . levothyroxine  150 mcg Oral QAC breakfast  . metoprolol succinate  25 mg Oral Daily  . pantoprazole  40 mg Oral Daily  . potassium chloride  10 mEq Oral Q lunch  . warfarin  2.5 mg Oral ONCE-1800  . Warfarin - Pharmacist Dosing Inpatient   Does not apply q1800    OBJECTIVE  Filed Vitals:   09/29/14 1022 09/29/14 1445 09/29/14 2015 09/30/14 0524  BP: 119/49 121/51 115/55 128/71  Pulse: 85 77 87 100  Temp: 97.8 F (36.6 C) 99 F (37.2 C) 98.7 F (37.1 C) 99.1 F (37.3 C)  TempSrc: Oral Oral Oral Oral  Resp: Height:      Weight:    185 lb 13.6 oz (84.3 kg)  SpO2: 98%  100% 99%    Intake/Output Summary (Last 24 hours) at 09/30/14 1145 Last data filed at 09/30/14 0525  Gross per 24 hour  Intake    350 ml  Output    900 ml  Net   -550 ml   Filed Weights   09/28/14 0612 09/29/14 0500 09/30/14 0524  Weight: 187 lb 4 oz (84.936 kg) 184 lb 9.6 oz (83.734 kg) 185 lb 13.6 oz (84.3 kg)    PHYSICAL EXAM  General: Pleasant, NAD. Neuro: Alert and oriented X 3. Moves all extremities spontaneously. Psych: Normal affect. HEENT:  Normal  Neck: Supple without bruits or JVD. Lungs:  Resp regular and unlabored, CTA. Heart: ir ir with tachycardia, systolic murmurs.  Abdomen: Soft, non-tender, non-distended, BS + x 4.  Extremities: No clubbing, cyanosis. Mild LE edema. DP/PT/Radials 2+ and equal bilaterally. Bilateral groin sites clear, extensive varicosities.  Accessory Clinical Findings  CBC  Recent Labs  09/29/14 0355 09/30/14 0332  WBC 4.5 3.2*  HGB 8.3* 7.8*  HCT 25.6*  23.8*  MCV 85.3 85.0  PLT 44* 36*   Basic Metabolic Panel  Recent Labs  09/29/14 0355 09/30/14 0332  NA 133* 137  K 4.3 3.7  CL 99* 101  CO2 25 25  GLUCOSE 108* 131*  BUN 14 13  CREATININE 0.78 0.80  CALCIUM 8.1* 8.1*  MG 2.4  --     TELE  afib at rate of 90s. Transient rate to 100s-140s. Frequent PVCs. Few pause, longest 1.7sec.    Echo 09/19/14 LV EF: 45% -  50%  ------------------------------------------------------------------- Indications:   424.1 Aortic valve disorders.  ------------------------------------------------------------------- History:  PMH: S/P TAVR. Mitral valve replacement. Rheumatic heart disease. Dyspnea. Mitral valve disease. Aortic valve disease. Primary pulmonary hypertension.  ------------------------------------------------------------------- Study Conclusions  - Left ventricle: The cavity size was normal. There was moderate concentric hypertrophy. Systolic function was mildly reduced. The estimated ejection fraction was in the range of 45% to 50%. Wall motion was normal; there were no regional wall motion abnormalities. The study is not technically sufficient to allow evaluation of LV diastolic function. - Aortic valve: S/P 26 mm TAVR Edward-SAPIEN valve, tehre is no central or paravalvular aortic regurgitation and tehre are normal transaortic gradients. Mean gradient (S):  11 mm Hg. Peak gradient (S): 23 mm Hg. Valve area (VTI): 1.63 cm^2. Valve area (Vmax): 1.46 cm^2. Valve area (Vmean): 1.56 cm^2. - Mitral valve: There was mild regurgitation. Peak gradient (D): 9 mm Hg. - Left atrium: The atrium was severely dilated. - Right ventricle: The cavity size was mildly dilated. Wall thickness was normal. Systolic function was mildly reduced. - Right atrium: The atrium was moderately dilated. - Tricuspid valve: There was moderate-severe regurgitation. - Pulmonary arteries: Systolic pressure was mildly to  moderately increased. PA peak pressure: 46 mm Hg (S). - Inferior vena cava: The vessel was normal in size. The respirophasic diameter changes were in the normal range (>= 50%), consistent with normal central venous pressure. - Pericardium, extracardiac: There was no pericardial effusion.  Impressions:  - Since the last study there is now a bioprosthetic TAVR Stephani PoliceEdward SAPIEN valve that is functioning well. No central AI or paravalvular leak is seen. Transaortic gradients are normal.  LVEF remains mildly impaired (LVEF =45%), RVSP is slightly improved now 46 mmHg.  ASSESSMENT AND PLAN  1. Proxysmal afib with RVR - Went into afib with RVR at rate up to the 140s early AM. Currently rate in 90-100s. Frequent PVCs, few pause, longest 1.7sec on tele. Toprol-XL increased to 25mg  yesterday morning. At home 50mg . Will give additional 25mg  today and switch to 50mg  tomorrow. Further management per Dr. Excell Seltzerooper. BP stable, continue to monitor.  - Previously on digoxin, however discontinued due to toxicity.  - On coumadin per pharmacy for anticoagulation.   2. Severe symptomatic aortic stenosis - S/p TVAR POD #2. - doing well, on coumadin - post procedure echo LV EF of 45-50%; no wall motion abnormality; TAVR Edward SAPIEN valve that is functioning well; RVSP is slightly improved now 46 mmHg.  3. Chronic diastolic heart failure - Continue lasix 40mg  po BID. Mild LE edema. Negative 600 I/O in last 24 hours. Continue Kdue 10 meq. Lytes stable  4. Anemia, acute on chronic - Hgb of 7.8 today   5. Acute on chronic Thrombocytopenia   - platelets decreased further to 36 from 44. Monitor closely. discontinued Plavix.   Lorelei PontSigned, Bhagat,Bhavinkumar PA-C Pager [phone]  Patient seen, examined. Available data reviewed. Agree with findings, assessment, and plan as outlined by Chelsea AusVin Bhagat, PA-C. She has developed atrial fibrillation with increased heart rate 90's at rest and 120's with walking.  Recommend transfuse 1 u prbc with HgB 7.8 - likely will be easier to control AF with transfusion. If HR's remain elevated after prbc's and increased beta blocker, may add Amio tomorrow. Discussed plan at length with patient and her son.  Tonny BollmanMichael Arraya Buck, M.D. 09/30/2014 4:44 PM

## 2014-09-30 NOTE — Progress Notes (Signed)
CARDIAC REHAB PHASE I   PRE:  Rate/Rhythm: 111 a.fib  BP:  Sitting: 119/66        SaO2: 100 RA  MODE:  Ambulation: 360 ft   POST:  Rate/Rhythm: 120 a.fib, 96 after 2 minutes rest  BP:  Sitting: 108/60         SaO2: 100 RA  Pt states she ambulated this morning and her heart rate became fast and irregular.  Pt states she has a hx of a.fib. Pt has been in bed all day, said the PA told her to "stay put." Pt states that she has been waiting to see Dr. Excell Seltzerooper to see if she can walk.  Pt states she has been asymptomatic with her a.fib since this morning's episode. Pt agreeable to walk and eager to get out of bed. Pt son at bedside, joined in walk. Pt ambulated 360 ft on RA, independent, slow steady gait, tolerated well. Pt denies CP, dizziness, DOE or palpitations. Pt HR 113-123 during ambulation, mostly sustaining between 118-120.  Pt HR quickly returned to pre-ambulation rate, decreasing to 96 after 2 minutes rest. Pt to side of bed after walk per pt request, call bell within reach, son at bedside. Will follow-up tomorrow.    [id number]  Joylene GrapesMonge, Sunday Klos C, RN, BSN 09/30/2014 2:42 PM

## 2014-09-30 NOTE — Progress Notes (Addendum)
      [address]       [place] [id number]             [phone]        2 Days Post-Op Procedure(s) (LRB): TRANSCATHETER AORTIC VALVE REPLACEMENT, TRANSFEMORAL (N/A) TRANSESOPHAGEAL ECHOCARDIOGRAM (TEE) (N/A)  Subjective: Patient feels "fast heart rate". Has constipation but does not want laxative yet.  Objective: Vital signs in last 24 hours: Temp:  [97.8 F (36.6 C)-99.1 F (37.3 C)] 99.1 F (37.3 C) (06/30 0524) Pulse Rate:  [77-100] 100 (06/30 0524) Cardiac Rhythm:  [-]  Resp:  [18-20] 18 (06/30 0524) BP: (115-128)/(49-71) 128/71 mmHg (06/30 0524) SpO2:  [98 %-100 %] 99 % (06/30 0524) Weight:  [185 lb 13.6 oz (84.3 kg)] 185 lb 13.6 oz (84.3 kg) (06/30 0524)  Current Weight  09/30/14 185 lb 13.6 oz (84.3 kg)       Intake/Output from previous day: 06/29 0701 - 06/30 0700 In: 350 [P.O.:350] Out: 945 [Urine:945]   Physical Exam:  Cardiovascular: IRRR IRRR, sharp valve click (MV) Pulmonary: Clear to auscultation bilaterally; no rales, wheezes, or rhonchi. Abdomen: Soft, non tender, bowel sounds present. Extremities: Mild bilateral lower extremity edema. Wounds: Right groin dressing is clean and dry.   Lab Results: CBC: Recent Labs  09/29/14 0355 09/30/14 0332  WBC 4.5 3.2*  HGB 8.3* 7.8*  HCT 25.6* 23.8*  PLT 44* 36*   BMET:  Recent Labs  09/29/14 0355 09/30/14 0332  NA 133* 137  K 4.3 3.7  CL 99* 101  CO2 25 25  GLUCOSE 108* 131*  BUN 14 13  CREATININE 0.78 0.80  CALCIUM 8.1* 8.1*    PT/INR:  Lab Results  Component Value Date   INR 1.43 09/30/2014   INR 1.49 09/28/2014   INR 1.31 09/28/2014   ABG:  INR: Will add last result for INR, ABG once components are confirmed Will add last 4 CBG results once components are confirmed  Assessment/Plan:  1. CV - PAF. A fib with HR up to the 130's this am. Has mechanical MVR. On Toprol XL 25 mg daily and Coumadin. INR 1.43. Cardiology to manage a fib. 2.  Pulmonary - On  room air. Encourage incentive spirometer 3. Chronic diastolic failure-on Lasix 40 mg bid 4.  Acute blood loss anemia - H and H down to 7.8 and 23.8. Monitor need for transfusion. Start oral Ferrous 5. Acute on chronic thrombocytopenia-platelets down to 36,000 6. Supplement potassium  Jamie Howell,Jamie Howell 09/30/2014,8:22 AM  I have seen and examined the patient and agree with the assessment and plan as outlined.  Post op ECHO looks good.  Hgb down slightly which I suspect is related to fluid overload.  No sign of overt blood loss.  Watch platelet count.  Staying in AF today w/ resting pulse 90-100, HR increases w/ activity.  Patient has a long h/o PAF and increased HR may also be affected by anemia.  Will defer any further adjustments to medications to Dr Landry Dykeooper et al.     Jamie Howell 09/30/2014 5:13 PM

## 2014-09-30 NOTE — Progress Notes (Signed)
ANTICOAGULATION CONSULT NOTE - Initial Consult  Pharmacy Consult for Warfarin Indication: Mechanical MVR and AF   Allergies  Allergen Reactions  . Adhesive [Tape] Itching    Adhesive tape and backing (pls use elastic bandages with no adhesive)  . Penicillins Rash  . Sulfa Antibiotics Rash    Patient Measurements: Height:  (149.9 cm) Weight: 185 lb 13.6 oz (84.3 kg) IBW/kg (Calculated) : 43.2  Vital Signs: Temp: 99.1 F (37.3 C) (06/30 0524) Temp Source: Oral (06/30 0524) BP: 128/71 mmHg (06/30 0524) Pulse Rate: 100 (06/30 0524)  Labs:  Recent Labs  09/28/14 0619  09/28/14 1022  09/28/14 1150 09/29/14 0355 09/30/14 0332  HGB  --   < > 9.2*  < > 8.0* 8.3* 7.8*  HCT  --   < > 27.0*  < > 24.7* 25.6* 23.8*  PLT  --   --   --   --  42* 44* 36*  APTT 35  --   --   --  37  --   --   LABPROT 16.5*  --   --   --  18.1*  --  17.6*  INR 1.31  --   --   --  1.49  --  1.43  CREATININE  --   < > 0.80  --   --  0.78 0.80  < > = values in this interval not displayed.  Estimated Creatinine Clearance: 66.8 mL/min (by C-G formula based on Cr of 0.8).   Medical History: Past Medical History  Diagnosis Date  . HTN (hypertension)   . Thyrotoxicosis      without mention of goiter or other cause, without mention of thyrotoxic crisis or storm  . Hypothyroidism   . 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 Atrial fibrillation   . Atrial flutter   . Heart disease   . Hematoma   . Subdural hematoma   . Pulmonary HTN     mild  . OA (ocular albinism)   . Aortic valve stenosis     moderate aortic valve stenosis with moderate aortic insufficiency, stable MVR, trivial PR, mild TR, severe LAE, grade II-III diastolic dysfunction,mild pulmonary HTN , EF 50% by echo 12/2012  . 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 Chronic recurrent paroxysmal  atrial fibrillation   . Obesity (BMI 30-39.9) 07/08/2014  . Splenomegaly 07/14/2014  . Portal hypertension with esophageal varices 07/14/2014  . Heart murmur   . Temporary low platelet count   . Pneumonia     HX 1 + YR AGO   . 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 acetaminophen  1,000 mg Oral 4 times per day  . aspirin EC  81 mg Oral Daily  . cyclobenzaprine  5 mg Oral QHS  . [START ON 10/01/2014] ferrous sulfate  325 mg Oral Q breakfast  . furosemide  40 mg Oral BID  . levothyroxine  150 mcg Oral QAC breakfast  . metoprolol succinate  25 mg Oral Daily  . pantoprazole  40 mg Oral Daily  . potassium chloride  10 mEq Oral Q lunch  . potassium chloride  30 mEq Oral Once  . Warfarin - Pharmacist Dosing Inpatient  Does not apply q1800   Infusions:  . sodium chloride Stopped (09/28/14 1600)    Assessment: 64 yo F with history of mechanical MVR and PAF admitted on 09/28/2014 for TAVR. Pharmacy has been consulted to restart warfarin which has been on hold since 6/22 prior to surgery. INR on 6/28 was 1.49.  INR today remains stable at 1.43 after restarting home dose yesterday. H/H remains low and has trended down postop, plt also low with trend down to 36 (noted chronic thrombocytopenia preop with BL ~65-75). No significant s/s bleeding noted.   Home dose: 2.5 mg PO daily except 5 mg on Wednesday and Friday (verified with patient)   Goal of Therapy:  INR 2.5-3.5 Monitor platelets by anticoagulation protocol: Yes   Plan:  - Warfarin 2.5 mg PO x 1 tonight - Daily INR - Monitor CBC and for s/s bleeding  Stephanny Tsutsui K. Bonnye FavaNicolsen, PharmD, BCPS Clinical Pharmacist - Resident Pager: [phone] Pharmacy: [phone] 09/30/2014 10:09 AM

## 2014-09-30 NOTE — Progress Notes (Signed)
CARDIAC REHAB PHASE I   Went to ambulate with pt, pt lying in bed, states her HR has been fast and irregular and the PA told her not to walk until she sees Dr. Excell Seltzerooper today. Pt states she walked 3 times already early this morning and that is when her HR increased.  Will follow-up this afternoon to if appropriate for ambulation.  Joylene GrapesMonge, Lott Seelbach C, RN, BSN 09/30/2014 10:43 AM

## 2014-10-01 DIAGNOSIS — I350 Nonrheumatic aortic (valve) stenosis: Secondary | ICD-10-CM

## 2014-10-01 LAB — CBC
HEMATOCRIT: 24.9 % — AB (ref 36.0–46.0)
Hemoglobin: 8.1 g/dL — ABNORMAL LOW (ref 12.0–15.0)
MCH: 27.3 pg (ref 26.0–34.0)
MCHC: 32.5 g/dL (ref 30.0–36.0)
MCV: 83.8 fL (ref 78.0–100.0)
Platelets: 34 10*3/uL — ABNORMAL LOW (ref 150–400)
RBC: 2.97 MIL/uL — AB (ref 3.87–5.11)
RDW: 17.5 % — AB (ref 11.5–15.5)
WBC: 2.6 10*3/uL — ABNORMAL LOW (ref 4.0–10.5)

## 2014-10-01 LAB — BASIC METABOLIC PANEL
ANION GAP: 5 (ref 5–15)
BUN: 15 mg/dL (ref 6–20)
CALCIUM: 8.3 mg/dL — AB (ref 8.9–10.3)
CHLORIDE: 105 mmol/L (ref 101–111)
CO2: 27 mmol/L (ref 22–32)
Creatinine, Ser: 0.82 mg/dL (ref 0.44–1.00)
GFR calc Af Amer: >60 mL/min (ref >60)
Glucose, Bld: 107 mg/dL — ABNORMAL HIGH (ref 65–99)
POTASSIUM: 3.7 mmol/L (ref 3.5–5.1)
Sodium: 137 mmol/L (ref 135–145)

## 2014-10-01 LAB — PROTIME-INR
INR: 1.5 — AB (ref 0.00–1.49)
PROTHROMBIN TIME: 18.2 s — AB (ref 11.6–15.2)

## 2014-10-01 MED ORDER — AMIODARONE HCL 200 MG PO TABS
400.0000 mg | ORAL_TABLET | Freq: Two times a day (BID) | ORAL | Status: DC
  Administered 2014-10-02 (×2): 400 mg via ORAL
  Filled 2014-10-01 (×4): qty 2

## 2014-10-01 MED ORDER — WARFARIN SODIUM 5 MG PO TABS
5.0000 mg | ORAL_TABLET | Freq: Once | ORAL | Status: AC
  Administered 2014-10-01: 5 mg via ORAL
  Filled 2014-10-01: qty 1

## 2014-10-01 MED ORDER — POTASSIUM CHLORIDE CRYS ER 10 MEQ PO TBCR
30.0000 meq | EXTENDED_RELEASE_TABLET | Freq: Once | ORAL | Status: AC
  Administered 2014-10-01: 30 meq via ORAL
  Filled 2014-10-01: qty 1

## 2014-10-01 MED FILL — Magnesium Sulfate Inj 50%: INTRAMUSCULAR | Qty: 10 | Status: AC

## 2014-10-01 MED FILL — Insulin Regular (Human) Inj 100 Unit/ML: INTRAMUSCULAR | Qty: 2.5 | Status: AC

## 2014-10-01 MED FILL — Potassium Chloride Inj 2 mEq/ML: INTRAVENOUS | Qty: 40 | Status: AC

## 2014-10-01 MED FILL — Heparin Sodium (Porcine) Inj 1000 Unit/ML: INTRAMUSCULAR | Qty: 30 | Status: AC

## 2014-10-01 NOTE — Progress Notes (Addendum)
      [address]       [place] [id number]             [phone]        3 Days Post-Op Procedure(s) (LRB): TRANSCATHETER AORTIC VALVE REPLACEMENT, TRANSFEMORAL (N/A) TRANSESOPHAGEAL ECHOCARDIOGRAM (TEE) (N/A)  Subjective: Patient without complaints. She wants to walk this am.  Objective: Vital signs in last 24 hours: Temp:  [98 F (36.7 C)-98.5 F (36.9 C)] 98 F (36.7 C) (07/01 0525) Pulse Rate:  [76-95] 78 (07/01 0525) Cardiac Rhythm:  [-] Atrial fibrillation (06/30 1930) Resp:  [17-19] 19 (07/01 0525) BP: (102-121)/(43-61) 115/50 mmHg (07/01 0525) SpO2:  [99 %-100 %] 99 % (07/01 0525) Weight:  [186 lb 3.2 oz (84.46 kg)] 186 lb 3.2 oz (84.46 kg) (07/01 0525)  Current Weight  10/01/14 186 lb 3.2 oz (84.46 kg)       Intake/Output from previous day: 06/30 0701 - 07/01 0700 In: 590 [P.O.:240; Blood:350] Out: 500 [Urine:500]   Physical Exam:  Cardiovascular:RRR, sharp valve click (MV) Pulmonary: Clear to auscultation bilaterally; no rales, wheezes, or rhonchi. Abdomen: Soft, non tender, bowel sounds present. Extremities: Mild bilateral lower extremity edema. Wounds: Right groin dressing is clean and dry.   Lab Results: CBC:  Recent Labs  09/30/14 0332 10/01/14 0311  WBC 3.2* 2.6*  HGB 7.8* 8.1*  HCT 23.8* 24.9*  PLT 36* 34*   BMET:   Recent Labs  09/30/14 0332 10/01/14 0311  NA 137 137  K 3.7 3.7  CL 101 105  CO2 25 27  GLUCOSE 131* 107*  BUN 13 15  CREATININE 0.80 0.82  CALCIUM 8.1* 8.3*    PT/INR:  Lab Results  Component Value Date   INR 1.50* 10/01/2014   INR 1.43 09/30/2014   INR 1.49 09/28/2014   ABG:  INR: Will add last result for INR, ABG once components are confirmed Will add last 4 CBG results once components are confirmed  Assessment/Plan:  1. CV - PAF. A fib with RVR, frequent PVCs, and pauses yesterday. Has mechanical MVR. On Toprol XL 50 mg daily (as taken pre op) and Coumadin. INR 1.5. . 2.   Pulmonary - On room air. Encourage incentive spirometer 3. Chronic diastolic failure-on Lasix 40 mg bid 4.  Acute blood loss anemia - H and H slightly increased to 8.1 and 24.9. Start oral Ferrous 5. Acute on chronic thrombocytopenia-platelets down to 34,000 6. Supplement potassium 7. Hopefully, home this weekend. Per cardiology  ZIMMERMAN,DONIELLE MPA-C 10/01/2014,8:11 AM  I have seen and examined the patient and agree with the assessment and plan as outlined.  Purcell NailsClarence H Owen 10/01/2014 11:23 AM

## 2014-10-01 NOTE — Progress Notes (Addendum)
    Subjective:  No CP or shortness of breath. Feeling a little better this morning.   Objective:  Vital Signs in the last 24 hours: Temp:  [98 F (36.7 C)-98.5 F (36.9 C)] 98 F (36.7 C) (07/01 0525) Pulse Rate:  [76-95] 78 (07/01 0525) Resp:  [17-19] 19 (07/01 0525) BP: (102-121)/(43-61) 115/50 mmHg (07/01 0525) SpO2:  [99 %-100 %] 99 % (07/01 0525) Weight:  [186 lb 3.2 oz (84.46 kg)] 186 lb 3.2 oz (84.46 kg) (07/01 0525)  Intake/Output from previous day: 06/30 0701 - 07/01 0700 In: 590 [P.O.:240; Blood:350] Out: 500 [Urine:500]  Physical Exam: Pt is alert and oriented, NAD HEENT: normal Neck: JVP - normal Lungs: CTA bilaterally CV: RRR with soft ejection murmur and normal mechanical A2 Abd: soft, NT, Positive BS, no hepatomegaly Ext: no C/C/E, distal pulses intact and equal Skin: warm/dry no rash   Lab Results:  Recent Labs  09/30/14 0332 10/01/14 0311  WBC 3.2* 2.6*  HGB 7.8* 8.1*  PLT 36* 34*    Recent Labs  09/30/14 0332 10/01/14 0311  NA 137 137  K 3.7 3.7  CL 101 105  CO2 25 27  GLUCOSE 131* 107*  BUN 13 15  CREATININE 0.80 0.82   No results for input(s): TROPONINI in the last 72 hours.  Invalid input(s): CK, MB  Tele: Personally reviewed. Heart rate approximately 100 bpm. Probably AF but some P waves seen  Assessment/Plan:  1. Chronic diastolic CHF 2. Severe AS s/p TAVR POD#3 3. Anemia - s/p 1 u PRBC, H/H stable 4. Thrombocytopenia - acute on chronic 5. Atrial fibrillation 6. Cirrhosis  Will check 12-lead to confirm rhythm. Continue current med Rx. If AF confirmed will add Amiodarone 400 mg BID. Avoid lovenox in setting of high bleeding risk plt count 34k. Possible dc tomorrow pending clinical stability. Will arrange FU which will include TOC visit at one week and 30 day Valve [facility] appt. INR trending up. If she remains in atrial fibrillation, consider DCCV after 3 weeks therapeutic anticoagulation. Would try to limit amiodarone to  post-up period for no more than a few months if possible.  Tonny Bollmanooper, Amora Sheehy, M.D. 10/01/2014, 8:12 AM

## 2014-10-01 NOTE — Progress Notes (Signed)
CARDIAC REHAB PHASE I   PRE:  Rate/Rhythm: 100 ? ST or atrial flutter  BP:  Supine:   Sitting:   Standing:    SaO2:   Up in hall  MODE:  Ambulation: 550 ft   POST:  Rate/Rhythm: 102  BP:  Supine:   Sitting: 130/68  Standing:    SaO2: 100%RA 1040-1124 Pt met me at door walking independently. Pt walked 550 ft on RA with steady gait and hand held asst. Tolerated well. Education completed re IS, ex ed and CRP 2. Pt gave permission to refer to [facility] program.   Graylon Good, RN BSN  10/01/2014 11:21 AM

## 2014-10-01 NOTE — Progress Notes (Signed)
ANTICOAGULATION CONSULT NOTE - Follow Up Consult  Pharmacy Consult for Coumadin Indication: Mechanical MVR and AF  Allergies  Allergen Reactions  . Adhesive [Tape] Itching    Adhesive tape and backing (pls use elastic bandages with no adhesive)  . Penicillins Rash  . Sulfa Antibiotics Rash    Patient Measurements: Height: 4\' 11"  (149.9 cm) Weight: 186 lb 3.2 oz (84.46 kg) IBW/kg (Calculated) : 43.2  Vital Signs: Temp: 98 F (36.7 C) (07/01 0525) Temp Source: Oral (07/01 0525) BP: 115/50 mmHg (07/01 0525) Pulse Rate: 78 (07/01 0525)  Labs:  Recent Labs  09/28/14 1150 09/29/14 0355 09/30/14 0332 10/01/14 0311  HGB 8.0* 8.3* 7.8* 8.1*  HCT 24.7* 25.6* 23.8* 24.9*  PLT 42* 44* 36* 34*  APTT 37  --   --   --   LABPROT 18.1*  --  17.6* 18.2*  INR 1.49  --  1.43 1.50*  CREATININE  --  0.78 0.80 0.82    Estimated Creatinine Clearance: 65.3 mL/min (by C-G formula based on Cr of 0.82).   Assessment: 64 yo F with history of mechanical MVR and PAF admitted on 09/28/2014 for TAVR. Pharmacy has been consulted to restart warfarin which has been on hold since 6/22 prior to surgery. After two doses INR remains subtherapeutic at 1.5. Hgb stable at 8.1, plts low at 34. (chronic thrombocytopenia - BL 60-70s) No s/s of bleed.  Home dose: 2.5 mg PO daily except 5 mg on Wednesday and Friday (verified with patient)  Goal of Therapy:  INR 2.5-3.5 Monitor platelets by anticoagulation protocol: Yes   Plan:  Give coumadin 5mg  PO x 1 tonight Monitor daily INR, CBC, s/s of bleed  Santanna Olenik J 10/01/2014,8:08 AM

## 2014-10-02 DIAGNOSIS — I350 Nonrheumatic aortic (valve) stenosis: Secondary | ICD-10-CM

## 2014-10-02 LAB — TYPE AND SCREEN
ABO/RH(D): A POS
Antibody Screen: NEGATIVE
Unit division: 0
Unit division: 0

## 2014-10-02 LAB — PROTIME-INR
INR: 1.63 — AB (ref 0.00–1.49)
Prothrombin Time: 19.4 seconds — ABNORMAL HIGH (ref 11.6–15.2)

## 2014-10-02 MED ORDER — WARFARIN SODIUM 5 MG PO TABS
5.0000 mg | ORAL_TABLET | ORAL | Status: DC
  Filled 2014-10-02: qty 1

## 2014-10-02 MED ORDER — WARFARIN SODIUM 5 MG PO TABS: 5.0000 mg | ORAL_TABLET | ORAL | Status: DC

## 2014-10-02 MED ORDER — WARFARIN SODIUM 2.5 MG PO TABS: 2.5000 mg | ORAL_TABLET | ORAL | Status: DC

## 2014-10-02 MED ORDER — PANTOPRAZOLE SODIUM 40 MG PO TBEC: 40.0000 mg | DELAYED_RELEASE_TABLET | Freq: Every day | ORAL | Status: DC

## 2014-10-02 MED ORDER — WARFARIN SODIUM 2.5 MG PO TABS
2.5000 mg | ORAL_TABLET | ORAL | Status: DC
  Filled 2014-10-02: qty 1

## 2014-10-02 MED ORDER — FERROUS SULFATE 325 (65 FE) MG PO TABS: 325.0000 mg | ORAL_TABLET | Freq: Every day | ORAL | Status: DC

## 2014-10-02 MED ORDER — AMIODARONE HCL 200 MG PO TABS: 400.0000 mg | ORAL_TABLET | Freq: Two times a day (BID) | ORAL | Status: DC

## 2014-10-02 NOTE — Progress Notes (Signed)
      [address]       [place] [id number]             [phone]        4 Days Post-Op Procedure(s) (LRB): TRANSCATHETER AORTIC VALVE REPLACEMENT, TRANSFEMORAL (N/A) TRANSESOPHAGEAL ECHOCARDIOGRAM (TEE) (N/A)  Subjective: Patient just finished walking. She really wants to go home.  Objective: Vital signs in last 24 hours: Temp:  [98.5 F (36.9 C)-98.7 F (37.1 C)] 98.5 F (36.9 C) (07/02 0616) Pulse Rate:  [74-96] 74 (07/02 0616) Cardiac Rhythm:  [-] Atrial fibrillation (07/01 1955) Resp:  [18-20] 18 (07/02 0616) BP: (113-133)/(56-67) 120/59 mmHg (07/02 0616) SpO2:  [95 %-100 %] 100 % (07/02 0616) Weight:  [184 lb 11.2 oz (83.779 kg)] 184 lb 11.2 oz (83.779 kg) (07/02 0616)  Current Weight  10/02/14 184 lb 11.2 oz (83.779 kg)      Intake/Output from previous day: 07/01 0701 - 07/02 0700 In: 360 [P.O.:360] Out: -    Physical Exam:  Cardiovascular:RRR, sharp valve click (MV) Pulmonary: Clear to auscultation bilaterally; no rales, wheezes, or rhonchi. Abdomen: Soft, non tender, bowel sounds present. Extremities: Mild bilateral lower extremity edema. Wounds: Left groin wound is clean and dry.  Lab Results: CBC:  Recent Labs  09/30/14 0332 10/01/14 0311  WBC 3.2* 2.6*  HGB 7.8* 8.1*  HCT 23.8* 24.9*  PLT 36* 34*   BMET:   Recent Labs  09/30/14 0332 10/01/14 0311  NA 137 137  K 3.7 3.7  CL 101 105  CO2 25 27  GLUCOSE 131* 107*  BUN 13 15  CREATININE 0.80 0.82  CALCIUM 8.1* 8.3*    PT/INR:  Lab Results  Component Value Date   INR 1.63* 10/02/2014   INR 1.50* 10/01/2014   INR 1.43 09/30/2014   ABG:  INR: Will add last result for INR, ABG once components are confirmed Will add last 4 CBG results once components are confirmed  Assessment/Plan:  1. CV - PAF. SR in the 90's at the time of my exam. Has mechanical MVR. On  Amiodarone 400 mg bid,Toprol XL 50 mg daily (as taken pre op) and Coumadin. INR up to 1.63 2.   Pulmonary - On room air. Encourage incentive spirometer 3. Chronic diastolic failure-on Lasix 40 mg bid 4.  Acute blood loss anemia - H and H yesterday 8.1 and 24.9. Start oral Ferrous 5. Acute on chronic thrombocytopenia-platelets down to 34,000 6. Hopefully, home today or in am. Per cardiology  Hadlea Furuya MPA-C 10/02/2014,8:35 AM

## 2014-10-02 NOTE — Progress Notes (Signed)
Noted order to DC patient home.DC instructions given and patient verbalized understanding 

## 2014-10-02 NOTE — Progress Notes (Addendum)
ANTICOAGULATION CONSULT NOTE - Follow Up Consult  Pharmacy Consult for Coumadin Indication: Mechanical MVR and AF  Allergies  Allergen Reactions  . Adhesive [Tape] Itching    Adhesive tape and backing (pls use elastic bandages with no adhesive)  . Penicillins Rash  . Sulfa Antibiotics Rash    Patient Measurements: Height: 4\' 11"  (149.9 cm) Weight: 184 lb 11.2 oz (83.779 kg) IBW/kg (Calculated) : 43.2  Vital Signs: Temp: 98.5 F (36.9 C) (07/02 0616) Temp Source: Oral (07/02 0616) BP: 123/63 mmHg (07/02 0956) Pulse Rate: 96 (07/02 0956)  Labs:  Recent Labs  09/30/14 0332 10/01/14 0311 10/02/14 0426  HGB 7.8* 8.1*  --   HCT 23.8* 24.9*  --   PLT 36* 34*  --   LABPROT 17.6* 18.2* 19.4*  INR 1.43 1.50* 1.63*  CREATININE 0.80 0.82  --     Estimated Creatinine Clearance: 65 mL/min (by C-G formula based on Cr of 0.82).   Assessment: 64 yo F with history of mechanical MVR and PAF admitted on 09/28/2014 for TAVR. Pharmacy has been consulted to restart warfarin which has been on hold since 6/22 prior to surgery. After two doses INR not therapeutic yet. Plan is to dc home today and f/u with anticoag next tues.   Home dose: 2.5 mg PO daily except 5 mg on Wednesday and Friday (verified with patient)  Goal of Therapy:  INR 2.5-3.5 Monitor platelets by anticoagulation protocol: Yes   Plan:   Coumadin 2.5mg  qday except 5mg  WF Monitor daily INR, CBC, s/s of bleed  Ulyses SouthwardMinh Paden Kuras, PharmD Pager: [phone] 10/02/2014 12:14 PM

## 2014-10-02 NOTE — Discharge Summary (Signed)
Physician Discharge Summary     Cardiologist:  Excell Seltzer Patient ID: Jamie Howell MRN: [id number] DOB/AGE: [birth year] 64 y.o.  Admit date: 09/28/2014 Discharge date: 10/02/2014  Admission Diagnoses:  Severe aortic stenosis, S/P TAVR (transcatheter aortic valve replacement)  Discharge Diagnoses:  Principal Problem:   S/P TAVR (transcatheter aortic valve replacement) Active Problems:   HTN (hypertension)   Mitral valve disorder   Chronic recurrent paroxysmal atrial fibrillation   Rheumatic mitral valve disease   Moderate aortic insufficiency   Chronic diastolic CHF (congestive heart failure)   Pulmonary HTN   SOB (shortness of breath)   Severe aortic stenosis   S/P mitral valve replacement with metallic valve   Obesity (BMI 40-98.9)   Thrombocytopenia   Splenomegaly   Portal hypertension with esophageal varices   H/O mitral valve replacement with mechanical valve   Severe aortic valve stenosis  acute on chronic anemia  Discharged Condition: stable  Hospital Course:    Jamie Howell is a 64 y.o. female with a history of HTN, RHD s/p MVR on coumadin, PAF, and severe aortic stenosis.  She underwent dental extraction back in May 2016 in anticipation of undergoing TAVR.  Presented on 09/28/2014 for aortic valve replacement.  Procedure was completed successfully.  Coumadin was resumed post Toprol at half her home dose. Amlodipine was held. The day after the surgery patient ambulated 300 feet holding onto a wheelchair and tolerated it well. Postop echocardiogram revealed ejection fraction of 45-50%.  There is no central or paravalvular aortic regurgitation from the new valve(see complete echo report below).  Patient's heart rhythm went into A. fib RVR with rate up in the 140s. Her Toprol was increased to 50 mg and she was later started on amiodarone 400 mg twice daily which will be decreased to 200 mg twice daily 1 week after discharge. She continues in atrial fibrillation consider DCC  V after 3 weeks of therapeutic anticoagulation. Amiodarone should be limited to just a few months postop. She was also given Lasix 40 mg twice daily which is being continued at discharge.  She will not be bridged with Lovenox back to Coumadin due to thrombocytopenia. She was also started on by mouth iron.  INR discharge is 1.63 and will be checked on Tuesday at the Coumadin clinic.  Hemoglobin was low but stable.  The patient was seen by Dr. Diona Browner who felt she was stable for DC home. Can likely stop her aspirin once therapeutic.   Consults: Cardiac rehabilitation  Significant Diagnostic Studies: Echocardiogram Study Conclusions  - Left ventricle: The cavity size was normal. There was moderate concentric hypertrophy. Systolic function was mildly reduced. The estimated ejection fraction was in the range of 45% to 50%. Wall motion was normal; there were no regional wall motion abnormalities. The study is not technically sufficient to allow evaluation of LV diastolic function. - Aortic valve: S/P 26 mm TAVR Edward-SAPIEN valve, tehre is no central or paravalvular aortic regurgitation and tehre are normal transaortic gradients. Mean gradient (S): 11 mm Hg. Peak gradient (S): 23 mm Hg. Valve area (VTI): 1.63 cm^2. Valve area (Vmax): 1.46 cm^2. Valve area (Vmean): 1.56 cm^2. - Mitral valve: There was mild regurgitation. Peak gradient (D): 9 mm Hg. - Left atrium: The atrium was severely dilated. - Right ventricle: The cavity size was mildly dilated. Wall thickness was normal. Systolic function was mildly reduced. - Right atrium: The atrium was moderately dilated. - Tricuspid valve: There was moderate-severe regurgitation. - Pulmonary arteries: Systolic pressure was mildly to  moderately increased. PA peak pressure: 46 mm Hg (S). - Inferior vena cava: The vessel was normal in size. The respirophasic diameter changes were in the normal range (>= 50%), consistent with  normal central venous pressure. - Pericardium, extracardiac: There was no pericardial effusion.  Impressions:  - Since the last study there is now a bioprosthetic TAVR Stephani PoliceEdward SAPIEN valve that is functioning well. No central AI or paravalvular leak is seen. Transaortic gradients are normal.  LVEF remains mildly impaired (LVEF =45%), RVSP is slightly improved now 46 mmHg.  Treatments: See Above  Discharge Exam: Blood pressure 123/63, pulse 96, temperature 98.5 F (36.9 C), temperature source Oral, resp. rate 18, height 4\' 11"  (1.499 m), weight 184 lb 11.2 oz (83.779 kg), SpO2 100 %.   Disposition: 01-Home or Self Care      Discharge Instructions    Amb Referral to Cardiac Rehabilitation    Complete by:  As directed   Congestive Heart Failure: If diagnosis is Heart Failure, patient MUST meet each of the CMS criteria: 1. Left Ventricular Ejection Fraction </= 35% 2. NYHA class II-IV symptoms despite being on optimal heart failure therapy for at least 6 weeks. 3. Stable = have not had a recent (<6 weeks) or planned (<6 months) major cardiovascular hospitalization or procedure  Program Details: - Physician supervised classes - 1-3 classes per week over a 12-18 week period, generally for a total of 36 sessions  Physician Certification: I certify that the above Cardiac Rehabilitation treatment is medically necessary and is medically approved by me for treatment of this patient. The patient is willing and cooperative, able to ambulate and medically stable to participate in exercise rehabilitation. The participant's progress and Individualized Treatment Plan will be reviewed by the Medical Director, Cardiac Rehab staff and as indicated by the Referring/Ordering Physician.  Diagnosis:  Valve Replacement/Repair     Diet - low sodium heart healthy    Complete by:  As directed      Increase activity slowly    Complete by:  As directed             Medication List    STOP  taking these medications        amLODipine 2.5 MG tablet  Commonly known as:  NORVASC     enoxaparin 40 MG/0.4ML injection  Commonly known as:  LOVENOX      TAKE these medications        amiodarone 200 MG tablet  Commonly known as:  PACERONE  Take 2 tablets (400 mg total) by mouth 2 (two) times daily.     aspirin EC 81 MG tablet  Take 1 tablet (81 mg total) by mouth daily.     CALCIUM 1000 + D PO  Take 1,000 mg by mouth daily with lunch.     cyclobenzaprine 10 MG tablet  Commonly known as:  FLEXERIL  Take 5 mg by mouth at bedtime.     ferrous sulfate 325 (65 FE) MG tablet  Take 1 tablet (325 mg total) by mouth daily with breakfast.     furosemide 40 MG tablet  Commonly known as:  LASIX  Take 1 tablet (40 mg total) by mouth 2 (two) times daily.     levothyroxine 150 MCG tablet  Commonly known as:  SYNTHROID, LEVOTHROID  Take 150 mcg by mouth daily before breakfast.     metoprolol succinate 50 MG 24 hr tablet  Commonly known as:  TOPROL-XL  TAKE ONE TABLET BY MOUTH ONCE DAILY WITH  A MEAL OR IMMEDIATELY FOLLOWING A MEAL.     pantoprazole 40 MG tablet  Commonly known as:  PROTONIX  Take 1 tablet (40 mg total) by mouth daily.     potassium chloride 10 MEQ tablet  Commonly known as:  K-DUR  Take 10 mEq by mouth daily with lunch.     warfarin 5 MG tablet  Commonly known as:  COUMADIN  Take 2.5-5 mg by mouth daily. Take 2.5 mg (1/2 tablet) daily except 5 mg (1 tablet) on Wednesday and Friday       Follow-up Information    Follow up with Tonny Bollman, MD.   Specialty:  Cardiology   Why:  The office will call you with follow-up appointment time   Contact information:   [address] [place] [address] [phone]       Follow up with Coumadin clinic On 10/05/2014.   Why:  The office will call you with the time of the appointment   Contact information:   [address] [place] [address] [phone]      Follow up  with [name], MD On 10/18/2014.   Specialty:  Cardiothoracic Surgery   Why:  2:30 PM   Contact information:   [address] [employer] [address] [place] [address] [phone]      Greater than 30 minutes was spent completing the patient's discharge.    SignedWilburt Finlay, Pac 10/02/2014, 12:51 PM

## 2014-10-02 NOTE — Progress Notes (Signed)
Primary cardiologist: Dr. Michael Cooper  Seen for followup: Status post TAVR, AF  Subjective:    Patient reports no palpitations or chest pain. She is very eager to go home today.  Objective:   Temp:  [98.5 F (36.9 C)-98.7 F (37.1 C)] 98.5 F (36.9 C) (07/02 0616) Pulse Rate:  [74-96] 96 (07/02 0956) Resp:  [18-20] 18 (07/02 0616) BP: (113-133)/(56-67) 123/63 mmHg (07/02 0956) SpO2:  [95 %-100 %] 100 % (07/02 0616) Weight:  [184 lb 11.2 oz (83.779 kg)] 184 lb 11.2 oz (83.779 kg) (07/02 0616) Last BM Date: 10/01/14  Filed Weights   09/30/14 0524 10/01/14 0525 10/02/14 0616  Weight: 185 lb 13.6 oz (84.3 kg) 186 lb 3.2 oz (84.46 kg) 184 lb 11.2 oz (83.779 kg)    Intake/Output Summary (Last 24 hours) at 10/02/14 1008 Last data filed at 10/02/14 9604  Gross per 24 hour  Intake    836 ml  Output      0 ml  Net    836 ml    Telemetry: Rate-controlled atrial flutter with variable block.  Exam:  General: Appears comfortable at rest.  Lungs: Clear, nonlabored.  Cardiac: Heart is irregular with soft systolic murmur and mechanical click.  Abdomen: NABS.  Extremities: No pitting edema.  Lab Results:  Basic Metabolic Panel:  Recent Labs Lab 09/29/14 0355 09/30/14 0332 10/01/14 0311  NA 133* 137 137  K 4.3 3.7 3.7  CL 99* 101 105  CO2 GLUCOSE 108* 131* 107*  BUN CREATININE 0.78 0.80 0.82  CALCIUM 8.1* 8.1* 8.3*  MG 2.4  --   --     CBC:  Recent Labs Lab 09/29/14 0355 09/30/14 0332 10/01/14 0311  WBC 4.5 3.2* 2.6*  HGB 8.3* 7.8* 8.1*  HCT 25.6* 23.8* 24.9*  MCV 85.3 85.0 83.8  PLT 44* 36* 34*    Coagulation:  Recent Labs Lab 09/30/14 0332 10/01/14 0311 10/02/14 0426  INR 1.43 1.50* 1.63*    ECG: Tracing from July 1 showed atrial flutter with variable conduction and old inferior infarct pattern, PVC.  Echocardiogram 09/29/2014: Study Conclusions  - Left ventricle: The cavity size was normal. There was  moderate concentric hypertrophy. Systolic function was mildly reduced. The estimated ejection fraction was in the range of 45% to 50%. Wall motion was normal; there were no regional wall motion abnormalities. The study is not technically sufficient to allow evaluation of LV diastolic function. - Aortic valve: S/P 26 mm TAVR Edward-SAPIEN valve, tehre is no central or paravalvular aortic regurgitation and tehre are normal transaortic gradients. Mean gradient (S): 11 mm Hg. Peak gradient (S): 23 mm Hg. Valve area (VTI): 1.63 cm^2. Valve area (Vmax): 1.46 cm^2. Valve area (Vmean): 1.56 cm^2. - Mitral valve: There was mild regurgitation. Peak gradient (D): 9 mm Hg. - Left atrium: The atrium was severely dilated. - Right ventricle: The cavity size was mildly dilated. Wall thickness was normal. Systolic function was mildly reduced. - Right atrium: The atrium was moderately dilated. - Tricuspid valve: There was moderate-severe regurgitation. - Pulmonary arteries: Systolic pressure was mildly to moderately increased. PA peak pressure: 46 mm Hg (S). - Inferior vena cava: The vessel was normal in size. The respirophasic diameter changes were in the normal range (>= 50%), consistent with normal central venous pressure. - Pericardium,Tonny Bollmanac: There was no pericardial effusion.  Impressions:  - Since the last study there is now a bioprosthetic TAVR Stephani Police valve that is  functioning well. No central AI or paravalvular leak is seen. Transaortic gradients are normal.  LVEF remains mildly impaired (LVEF =45%), RVSP is slightly improved now 46 mmHg.   Medications:   Scheduled Medications: . acetaminophen  1,000 mg Oral 4 times per day  . amiodarone  400 mg Oral BID  . aspirin EC  81 mg Oral Daily  . cyclobenzaprine  5 mg Oral QHS  . ferrous sulfate  325 mg Oral Q breakfast  . furosemide  40 mg Oral BID  . levothyroxine  150 mcg Oral QAC  breakfast  . metoprolol succinate  50 mg Oral Daily  . pantoprazole  40 mg Oral Daily  . potassium chloride  10 mEq Oral Q lunch  . Warfarin - Pharmacist Dosing Inpatient   Does not apply q1800     Infusions: . sodium chloride Stopped (09/28/14 1600)     PRN Medications:  ondansetron (ZOFRAN) IV, oxyCODONE, pneumococcal 23 valent vaccine, traMADol   Assessment:   1. Severe aortic stenosis status post TAVR postop day #4. Patient evaluated by Dr. Excell Seltzerooper yesterday with plan for probable discharge home today.  2. Rate-controlled atrial flutter with variable block. Oral amiodarone load initiated yesterday, also continues on metoprolol. INR increasing on Coumadin, not yet therapeutic. She is also on aspirin for now.  3. Acute on chronic thrombocytopenia, pancytopenia. Has history of cirrhosis. Hemoglobin and platelet count are relatively stable.  4. 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   Plan/Discussion:    Discharge home today. Patient will need a TOC visit in one week, also a 30 day valve clinic visit. Needs INR check on Tuesday in Coumadin clinic as well. Can likely stop her aspirin once therapeutic. Continue amiodarone load 400 mg twice daily for now, reduce to 200 mg twice daily at one-week follow-up. May need DCCV if atrial arrhythmia persists in the next 3-4 weeks. Continue Lasix and potassium supplements.   Jonelle SidleSamuel G. Garmon Dehn, M.D., F.A.C.C.

## 2014-10-06 ENCOUNTER — Other Ambulatory Visit: Payer: Self-pay

## 2014-10-06 DIAGNOSIS — I350 Nonrheumatic aortic (valve) stenosis: Secondary | ICD-10-CM

## 2014-10-06 DIAGNOSIS — Z952 Presence of prosthetic heart valve: Secondary | ICD-10-CM

## 2014-10-08 ENCOUNTER — Ambulatory Visit: Payer: 59 | Admitting: Hematology and Oncology

## 2014-10-08 ENCOUNTER — Other Ambulatory Visit: Payer: 59

## 2014-10-18 ENCOUNTER — Other Ambulatory Visit: Payer: Self-pay | Admitting: Thoracic Surgery (Cardiothoracic Vascular Surgery)

## 2014-10-18 ENCOUNTER — Inpatient Hospital Stay (HOSPITAL_COMMUNITY): Payer: 59

## 2014-10-18 ENCOUNTER — Encounter (HOSPITAL_COMMUNITY): Payer: Self-pay | Admitting: General Practice

## 2014-10-18 ENCOUNTER — Inpatient Hospital Stay (HOSPITAL_COMMUNITY)
Admission: AD | Admit: 2014-10-18 | Discharge: 2014-10-25 | DRG: 857 | Disposition: A | Discharge status: Home Health | Payer: 59 | Source: Ambulatory Visit | Attending: Thoracic Surgery (Cardiothoracic Vascular Surgery) | Admitting: Thoracic Surgery (Cardiothoracic Vascular Surgery)

## 2014-10-18 ENCOUNTER — Ambulatory Visit (INDEPENDENT_AMBULATORY_CARE_PROVIDER_SITE_OTHER): Payer: 59 | Admitting: Physician Assistant

## 2014-10-18 VITALS — BP 145/81 | HR 85 | Temp 97.6°F | Resp 16 | Ht 59.0 in | Wt 188.0 lb

## 2014-10-18 DIAGNOSIS — I350 Nonrheumatic aortic (valve) stenosis: Secondary | ICD-10-CM | POA: Diagnosis not present

## 2014-10-18 DIAGNOSIS — I10 Essential (primary) hypertension: Secondary | ICD-10-CM | POA: Diagnosis present

## 2014-10-18 DIAGNOSIS — B379 Candidiasis, unspecified: Secondary | ICD-10-CM | POA: Diagnosis present

## 2014-10-18 DIAGNOSIS — T8149XA Infection following a procedure, other surgical site, initial encounter: Secondary | ICD-10-CM | POA: Diagnosis present

## 2014-10-18 DIAGNOSIS — Z01811 Encounter for preprocedural respiratory examination: Secondary | ICD-10-CM

## 2014-10-18 DIAGNOSIS — E70319 Ocular albinism, unspecified: Secondary | ICD-10-CM | POA: Diagnosis present

## 2014-10-18 DIAGNOSIS — T8130XA Disruption of wound, unspecified, initial encounter: Secondary | ICD-10-CM | POA: Diagnosis present

## 2014-10-18 DIAGNOSIS — T8131XA Disruption of external operation (surgical) wound, not elsewhere classified, initial encounter: Secondary | ICD-10-CM | POA: Diagnosis not present

## 2014-10-18 DIAGNOSIS — Z6838 Body mass index (BMI) 38.0-38.9, adult: Secondary | ICD-10-CM

## 2014-10-18 DIAGNOSIS — T814XXA Infection following a procedure, initial encounter: Principal | ICD-10-CM | POA: Diagnosis present

## 2014-10-18 DIAGNOSIS — Z952 Presence of prosthetic heart valve: Secondary | ICD-10-CM | POA: Diagnosis not present

## 2014-10-18 DIAGNOSIS — Z7982 Long term (current) use of aspirin: Secondary | ICD-10-CM

## 2014-10-18 DIAGNOSIS — Z953 Presence of xenogenic heart valve: Secondary | ICD-10-CM

## 2014-10-18 DIAGNOSIS — Z79899 Other long term (current) drug therapy: Secondary | ICD-10-CM

## 2014-10-18 DIAGNOSIS — E039 Hypothyroidism, unspecified: Secondary | ICD-10-CM | POA: Diagnosis present

## 2014-10-18 DIAGNOSIS — I8390 Asymptomatic varicose veins of unspecified lower extremity: Secondary | ICD-10-CM | POA: Diagnosis present

## 2014-10-18 DIAGNOSIS — Z954 Presence of other heart-valve replacement: Secondary | ICD-10-CM

## 2014-10-18 DIAGNOSIS — D696 Thrombocytopenia, unspecified: Secondary | ICD-10-CM | POA: Diagnosis present

## 2014-10-18 DIAGNOSIS — B998 Other infectious disease: Secondary | ICD-10-CM | POA: Diagnosis present

## 2014-10-18 DIAGNOSIS — Z7901 Long term (current) use of anticoagulants: Secondary | ICD-10-CM | POA: Diagnosis not present

## 2014-10-18 DIAGNOSIS — Y838 Other surgical procedures as the cause of abnormal reaction of the patient, or of later complication, without mention of misadventure at the time of the procedure: Secondary | ICD-10-CM | POA: Diagnosis present

## 2014-10-18 HISTORY — DX: Anemia, unspecified: D64.9

## 2014-10-18 HISTORY — DX: Personal history of other medical treatment: Z92.89

## 2014-10-18 LAB — CBC
HCT: 33.9 % — ABNORMAL LOW (ref 36.0–46.0)
HEMOGLOBIN: 10.9 g/dL — AB (ref 12.0–15.0)
MCH: 29.1 pg (ref 26.0–34.0)
MCHC: 32.2 g/dL (ref 30.0–36.0)
MCV: 90.6 fL (ref 78.0–100.0)
Platelets: 60 10*3/uL — ABNORMAL LOW (ref 150–400)
RBC: 3.74 MIL/uL — ABNORMAL LOW (ref 3.87–5.11)
RDW: 21.1 % — ABNORMAL HIGH (ref 11.5–15.5)
WBC: 3.7 10*3/uL — ABNORMAL LOW (ref 4.0–10.5)

## 2014-10-18 LAB — PROTIME-INR
INR: 5.08 (ref 0.00–1.49)
INR: 4.89 — AB (ref 0.00–1.49)
PROTHROMBIN TIME: 44.2 s — AB (ref 11.6–15.2)
Prothrombin Time: 45.2 seconds — ABNORMAL HIGH (ref 11.6–15.2)

## 2014-10-18 LAB — URINALYSIS, ROUTINE W REFLEX MICROSCOPIC
BILIRUBIN URINE: NEGATIVE
GLUCOSE, UA: NEGATIVE mg/dL
Hgb urine dipstick: NEGATIVE
Ketones, ur: NEGATIVE mg/dL
Leukocytes, UA: NEGATIVE
Nitrite: NEGATIVE
PH: 6 (ref 5.0–8.0)
Protein, ur: NEGATIVE mg/dL
Specific Gravity, Urine: 1.01 (ref 1.005–1.030)
UROBILINOGEN UA: 1 mg/dL (ref 0.0–1.0)

## 2014-10-18 LAB — BASIC METABOLIC PANEL
Anion gap: 9 (ref 5–15)
BUN: 18 mg/dL (ref 6–20)
CALCIUM: 8.8 mg/dL — AB (ref 8.9–10.3)
CO2: 30 mmol/L (ref 22–32)
Chloride: 100 mmol/L — ABNORMAL LOW (ref 101–111)
Creatinine, Ser: 1.27 mg/dL — ABNORMAL HIGH (ref 0.44–1.00)
GFR calc Af Amer: 51 mL/min — ABNORMAL LOW (ref >60)
GFR, EST NON AFRICAN AMERICAN: 44 mL/min — AB (ref >60)
Glucose, Bld: 205 mg/dL — ABNORMAL HIGH (ref 65–99)
Potassium: 3.4 mmol/L — ABNORMAL LOW (ref 3.5–5.1)
Sodium: 139 mmol/L (ref 135–145)

## 2014-10-18 LAB — APTT: aPTT: 56 seconds — ABNORMAL HIGH (ref 24–37)

## 2014-10-18 MED ORDER — CIPROFLOXACIN IN D5W 400 MG/200ML IV SOLN
400.0000 mg | Freq: Two times a day (BID) | INTRAVENOUS | Status: DC
  Administered 2014-10-18 – 2014-10-21 (×6): 400 mg via INTRAVENOUS
  Filled 2014-10-18 (×8): qty 200

## 2014-10-18 MED ORDER — ASPIRIN EC 81 MG PO TBEC
81.0000 mg | DELAYED_RELEASE_TABLET | Freq: Every day | ORAL | Status: DC
  Administered 2014-10-18 – 2014-10-19 (×2): 81 mg via ORAL
  Filled 2014-10-18 (×4): qty 1

## 2014-10-18 MED ORDER — DOCUSATE SODIUM 100 MG PO CAPS
100.0000 mg | ORAL_CAPSULE | Freq: Every day | ORAL | Status: DC
  Administered 2014-10-18 – 2014-10-25 (×7): 100 mg via ORAL
  Filled 2014-10-18 (×8): qty 1

## 2014-10-18 MED ORDER — PHYTONADIONE 5 MG PO TABS
2.5000 mg | ORAL_TABLET | Freq: Once | ORAL | Status: AC
  Administered 2014-10-18: 2.5 mg via ORAL
  Filled 2014-10-18: qty 1

## 2014-10-18 MED ORDER — CYCLOBENZAPRINE HCL 5 MG PO TABS
5.0000 mg | ORAL_TABLET | Freq: Every day | ORAL | Status: DC
  Administered 2014-10-18 – 2014-10-20 (×3): 5 mg via ORAL
  Filled 2014-10-18 (×4): qty 1

## 2014-10-18 MED ORDER — PANTOPRAZOLE SODIUM 40 MG PO TBEC
40.0000 mg | DELAYED_RELEASE_TABLET | Freq: Every day | ORAL | Status: DC
  Administered 2014-10-19 – 2014-10-21 (×2): 40 mg via ORAL
  Filled 2014-10-18 (×4): qty 1

## 2014-10-18 MED ORDER — ALUM & MAG HYDROXIDE-SIMETH 200-200-20 MG/5ML PO SUSP: 30.0000 mL | Freq: Four times a day (QID) | ORAL | Status: DC | PRN

## 2014-10-18 MED ORDER — VANCOMYCIN HCL IN DEXTROSE 750-5 MG/150ML-% IV SOLN
750.0000 mg | Freq: Two times a day (BID) | INTRAVENOUS | Status: DC
Start: 2014-10-19 — End: 2014-10-24
  Administered 2014-10-19 – 2014-10-24 (×11): 750 mg via INTRAVENOUS
  Filled 2014-10-18 (×13): qty 150

## 2014-10-18 MED ORDER — ONDANSETRON HCL 4 MG PO TABS: 4.0000 mg | ORAL_TABLET | Freq: Four times a day (QID) | ORAL | Status: DC | PRN

## 2014-10-18 MED ORDER — SODIUM CHLORIDE 0.9 % IJ SOLN
3.0000 mL | Freq: Two times a day (BID) | INTRAMUSCULAR | Status: DC
  Administered 2014-10-19 – 2014-10-25 (×9): 3 mL via INTRAVENOUS

## 2014-10-18 MED ORDER — POTASSIUM CHLORIDE CRYS ER 20 MEQ PO TBCR
20.0000 meq | EXTENDED_RELEASE_TABLET | Freq: Two times a day (BID) | ORAL | Status: DC
  Administered 2014-10-18 – 2014-10-20 (×4): 20 meq via ORAL
  Filled 2014-10-18 (×9): qty 1

## 2014-10-18 MED ORDER — BISACODYL 5 MG PO TBEC
5.0000 mg | DELAYED_RELEASE_TABLET | Freq: Every day | ORAL | Status: DC | PRN
Start: 2014-10-18 — End: 2014-10-25

## 2014-10-18 MED ORDER — FUROSEMIDE 40 MG PO TABS
40.0000 mg | ORAL_TABLET | Freq: Two times a day (BID) | ORAL | Status: DC
  Administered 2014-10-18 – 2014-10-20 (×4): 40 mg via ORAL
  Filled 2014-10-18 (×8): qty 1

## 2014-10-18 MED ORDER — ONDANSETRON HCL 4 MG/2ML IJ SOLN
4.0000 mg | Freq: Four times a day (QID) | INTRAMUSCULAR | Status: DC | PRN
  Administered 2014-10-24: 4 mg via INTRAVENOUS
  Filled 2014-10-18 (×2): qty 2

## 2014-10-18 MED ORDER — ACETAMINOPHEN 325 MG PO TABS: 650.0000 mg | ORAL_TABLET | Freq: Four times a day (QID) | ORAL | Status: DC | PRN

## 2014-10-18 MED ORDER — ACETAMINOPHEN 650 MG RE SUPP: 650.0000 mg | Freq: Four times a day (QID) | RECTAL | Status: DC | PRN

## 2014-10-18 MED ORDER — FERROUS SULFATE 325 (65 FE) MG PO TABS
325.0000 mg | ORAL_TABLET | Freq: Every day | ORAL | Status: DC
Start: 2014-10-19 — End: 2014-10-21
  Administered 2014-10-19: 325 mg via ORAL
  Filled 2014-10-18 (×4): qty 1

## 2014-10-18 MED ORDER — AMIODARONE HCL 200 MG PO TABS
200.0000 mg | ORAL_TABLET | Freq: Two times a day (BID) | ORAL | Status: DC
  Administered 2014-10-18 – 2014-10-19 (×2): 200 mg via ORAL
  Filled 2014-10-18 (×3): qty 1

## 2014-10-18 MED ORDER — OXYCODONE HCL 5 MG PO TABS
5.0000 mg | ORAL_TABLET | ORAL | Status: DC | PRN
  Administered 2014-10-19: 5 mg via ORAL
  Filled 2014-10-18: qty 1

## 2014-10-18 MED ORDER — VANCOMYCIN HCL 10 G IV SOLR
1750.0000 mg | Freq: Once | INTRAVENOUS | Status: AC
  Administered 2014-10-18: 1750 mg via INTRAVENOUS
  Filled 2014-10-18: qty 1750

## 2014-10-18 MED ORDER — NYSTATIN 100000 UNIT/GM EX POWD
Freq: Two times a day (BID) | CUTANEOUS | Status: DC
  Administered 2014-10-18 – 2014-10-25 (×13): via TOPICAL
  Filled 2014-10-18 (×2): qty 15

## 2014-10-18 MED ORDER — METOPROLOL SUCCINATE ER 50 MG PO TB24
50.0000 mg | ORAL_TABLET | Freq: Every evening | ORAL | Status: DC
  Administered 2014-10-18 – 2014-10-20 (×3): 50 mg via ORAL
  Filled 2014-10-18 (×4): qty 1

## 2014-10-18 MED ORDER — LEVOTHYROXINE SODIUM 150 MCG PO TABS
150.0000 ug | ORAL_TABLET | Freq: Every day | ORAL | Status: DC
  Administered 2014-10-19 – 2014-10-21 (×2): 150 ug via ORAL
  Filled 2014-10-18 (×4): qty 1

## 2014-10-18 NOTE — Progress Notes (Addendum)
ANTIBIOTIC CONSULT NOTE - INITIAL  Pharmacy Consult for vancomycin Indication: wound infection  Allergies  Allergen Reactions  . Adhesive [Tape] Itching    Adhesive tape and backing (pls use elastic bandages with no adhesive)  . Penicillins Rash  . Sulfa Antibiotics Rash    Patient Measurements: Height:  (149.9 cm) Weight: 184 lb 4.8 oz (83.598 kg) IBW/kg (Calculated) : 43.2  Vital Signs: Temp: 98.8 F (37.1 C) (07/18 1550) Temp Source: Oral (07/18 1550) BP: 144/62 mmHg (07/18 1550) Pulse Rate: 85 (07/18 1550) Intake/Output from previous day:   Intake/Output from this shift:    Labs: No results for input(s): WBC, HGB, PLT, LABCREA, CREATININE in the last 72 hours. Estimated Creatinine Clearance: 65 mL/min (by C-G formula based on Cr of 0.82). No results for input(s): VANCOTROUGH, VANCOPEAK, VANCORANDOM, GENTTROUGH, GENTPEAK, GENTRANDOM, TOBRATROUGH, TOBRAPEAK, TOBRARND, AMIKACINPEAK, AMIKACINTROU, AMIKACIN in the last 72 hours.   Microbiology: Recent Results (from the past 720 hour(s))  Surgical pcr screen     Status: Abnormal   Collection Time: 09/24/14 10:43 AM  Result Value Ref Range Status   MRSA, PCR NEGATIVE NEGATIVE Final   Staphylococcus aureus POSITIVE (A) NEGATIVE Final    Comment:        The Xpert SA Assay (FDA approved for NASAL specimens in patients over 19 years of age), is one component of a comprehensive surveillance program.  Test performance has been validated by [facility] for patients greater than or equal to 64 year old. It is not intended to diagnose infection nor to guide or monitor treatment.     Medical History: Past Medical History  Diagnosis Date  . HTN (hypertension)   . Thyrotoxicosis      without mention of goiter or other cause, without mention of thyrotoxic crisis or storm  . Hypothyroidism   . 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 Atrial fibrillation   . Atrial flutter   .  Heart disease   . Hematoma   . Subdural hematoma   . Pulmonary HTN     mild  . OA (ocular albinism)   . Aortic valve stenosis     moderate aortic valve stenosis with moderate aortic insufficiency, stable MVR, trivial PR, mild TR, severe LAE, grade II-III diastolic dysfunction,mild pulmonary HTN , EF 50% by echo 12/2012  . 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 Chronic recurrent paroxysmal atrial fibrillation   . Obesity (BMI 30-39.9) 07/08/2014  . Splenomegaly 07/14/2014  . Portal hypertension with esophageal varices 07/14/2014  . Heart murmur   . Temporary low platelet count   . Pneumonia     HX 1 + YR AGO   . S/P TAVR (transcatheter aortic valve replacement) 09/28/2014    26 mm Edwards Sapien 3 transcatheter heart valve placed via open left transfemoral approach    Assessment: 64 yof with CC: L groin infection and dehiscence. S/p TAVR via L femoral approach for severe aortic stenosis in 09/28/2014. Pharmacy consulted to dose vanc for wound infection. Also started on cipro per MD. Scheduled for I&D L groin wound w/ wound VAC in AM. Afebrile, wbc low 3.7. SCr 1.27 on admit (~0.8 on 7/1), CrCl~42. No cultures.  7/18 vanc>> 7/18 zosyn>>   Goal of Therapy:  Vancomycin trough level 15-20 mcg/ml  Plan:  Vanc  x 1 dose; then Vanc  IV  q12h Cipro per MD Mon clinical progress, renal function, abx plan  Babs BertinHaley Tranika Howell, PharmD Clinical Pharmacist Pager [phone] 10/18/2014 4:19 PM

## 2014-10-18 NOTE — Progress Notes (Signed)
10/18/2014 6:17 PM Critical value called by Jose Persiaennis Bradley in lab-INR 5.08.  Notified Dr. Tyrone SageGerhardt on call for Dr. Cornelius Moraswen.  Orders received to give 2mg  Vitamin K PO now and to get an INR in the AM. Kathryne HitchAllen, Rolf Fells C

## 2014-10-18 NOTE — Progress Notes (Signed)
  HPI:  Patient returns for routine postoperative follow-up having undergone a TAVR via left femoral approach. The patient's early postoperative recovery while in the hospital was notable for a fib with RVR. Since hospital discharge the patient reports her left groin wound has "opened up" and has had some drainage. She denies any fever or chills.   Current Outpatient Prescriptions  Medication Sig Dispense Refill  . amiodarone (PACERONE) 200 MG tablet Take 2 tablets (400 mg total) by mouth 2 (two) times daily. 60 tablet 5  . aspirin EC 81 MG tablet Take 1 tablet (81 mg total) by mouth daily. (Patient taking differently: Take 81 mg by mouth daily with lunch. )    . Calcium Carb-Cholecalciferol (CALCIUM 1000 + D PO) Take 1,000 mg by mouth daily with lunch.     . cyclobenzaprine (FLEXERIL) 10 MG tablet Take 5 mg by mouth at bedtime.     . ferrous sulfate 325 (65 FE) MG tablet Take 1 tablet (325 mg total) by mouth daily with breakfast. 30 tablet 5  . furosemide (LASIX) 40 MG tablet Take 1 tablet (40 mg total) by mouth 2 (two) times daily. (Patient taking differently: Take 40 mg by mouth 2 (two) times daily. Morning and lunch time)    . levothyroxine (SYNTHROID, LEVOTHROID) 150 MCG tablet Take 150 mcg by mouth daily before breakfast.    . metoprolol succinate (TOPROL-XL) 50 MG 24 hr tablet TAKE ONE TABLET BY MOUTH ONCE DAILY WITH A MEAL OR IMMEDIATELY FOLLOWING A MEAL. (Patient taking differently: TAKE ONE TABLET BY MOUTH ONCE DAILY AT BEDTIME) 30 tablet 3  . pantoprazole (PROTONIX) 40 MG tablet Take 1 tablet (40 mg total) by mouth daily. 30 tablet 5  . potassium chloride (K-DUR) 10 MEQ tablet Take 10 mEq by mouth daily with lunch.     . warfarin (COUMADIN) 5 MG tablet Take 2.5-5 mg by mouth daily. Take 2.5 mg (1/2 tablet) daily except 5 mg (1 tablet) on Wednesday and Friday    Vital Signs: BP 145/81,HR 85, RR 16, Oxygen saturation 98% on room air  Physical Exam: CV-RRR,sharp valve click (mitral  valve), soft systolic murmur Pulmonary-slightly diminshed at bases Extremities-Mild LE edema Wound-left groin wound is completely dehisced. Sloughy tissue present. Yeast around both groins   Impression and Plan: I obtained a wound culture of left groin. [facility] for antibiotics, incision and drainage, and  a wound VAC.   Ardelle BallsZIMMERMAN,Edvardo Honse M, PA-C [facility] [phone]

## 2014-10-18 NOTE — H&P (Signed)
[address]       [place] [address]             [phone]        Jamie Howell [facility] Medical Record #[id number] Date of Birth: 1950/12/05  Cardiologist: Dr. Clifton James Primary Care: Londell Moh, MD  Chief Complaint:  Left groin wound infection and dehiscence  History of Present Illness:     This is a 64 year old Caucasian female who presented to the office today for a 2 week follow up appointment. She underwent TAVR via left femoral approach for severe aortic stenosis by Dr. Cornelius Moras and Dr. Excell Seltzer on 09/28/2014. She states that on this past Friday, she noticed her left groin wound had some drainage and "began to open up". She denies any fever, chills, shortness of breath, or chest pain. I inquired when her last PT and INR were drawn, as she is on Coumadin for a St. Jude mechanical valve. She stated she has not had her INR checked since she left the hospital on 10/02/2014. On physical exam, her left groin wound had sloughy tissue and was dehisced. She also appears to have yeast under panus and on inner upper thighs. I obtained a wound culture of the left groin. She will be admitted to [facility] today for IV antibiotics. She will undergo insicion and drainage of left groin wound with placement of wound VAC in the am.   Current Activity/ Functional Status: Patient is independent with mobility/ambulation, transfers, ADL's, IADL's.   Zubrod Score: At the time of surgery this patient's most appropriate activity status/level should be described as: [x]     0    Normal activity, no symptoms []     1    Restricted in physical strenuous activity but ambulatory, able to do out light work []     2    Ambulatory and capable of self care, unable to do work activities, up and about                 more than 50%  Of the time                            []     3    Only limited self care, in bed greater than 50% of waking hours []     4    Completely disabled, no  self care, confined to bed or chair []     5    Moribund  Past Medical History  Diagnosis Date  . HTN (hypertension)   . Thyrotoxicosis      without mention of goiter or other cause, without mention of thyrotoxic crisis or storm  . Hypothyroidism   . 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 Atrial fibrillation   . Atrial flutter   . Heart disease   . Hematoma   . Subdural hematoma   . Pulmonary HTN     mild  . OA (ocular albinism)   . Aortic valve stenosis     moderate aortic valve stenosis with moderate aortic insufficiency, stable MVR, trivial PR, mild TR, severe LAE, grade II-III diastolic dysfunction,mild pulmonary HTN , EF 50% by echo 12/2012  . 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 Chronic recurrent paroxysmal atrial fibrillation   .  Obesity (BMI 30-39.9) 07/08/2014  . Splenomegaly 07/14/2014  . Portal hypertension with esophageal varices 07/14/2014  . Heart murmur   . Temporary low platelet count   . Pneumonia     HX 1 + YR AGO   . 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 Mitral valve replacement  04/08/1991    31mm St Jude mechanical valve - Dr Andrey Campanile  . Left and right heart catheterization with coronary angiogram N/A 06/28/2014    Procedure: LEFT AND RIGHT HEART CATHETERIZATION WITH CORONARY ANGIOGRAM;  Surgeon: Tonny Bollman, MD;  Location: [facility] CATH LAB;  Service: Cardiovascular;  Laterality: N/A;  . Subdural hematoma evacuation via craniotomy  2000    Dr Dutch Quint  . Cholecystectomy  05-30-90  . Total abdominal hysterectomy  07-23-1994  . Tonsillectomy    . Multiple extractions with alveoloplasty N/A 08/05/2014    Procedure: Extraction of tooth #'s  3,4,5,7,8,9,10,11,12,13,17,20,21,22,23,24,25,26,27, 28,29, and 32 with alveoloplasty;  Surgeon: Charlynne Pander, DDS;  Location: [facility] OR;  Service: Oral Surgery;  Laterality: N/A;  . Cardiac catheterization      06/2014  . Transcatheter aortic valve replacement, transfemoral N/A 09/28/2014    Procedure: TRANSCATHETER AORTIC VALVE REPLACEMENT, TRANSFEMORAL;  Surgeon: Tonny Bollman, MD;  Location: [facility] OR;  Service: Open Heart Surgery;  Laterality: N/A;  . Tee without cardioversion N/A 09/28/2014    Procedure: TRANSESOPHAGEAL ECHOCARDIOGRAM (TEE);  Surgeon: Tonny Bollman, MD;  Location: [facility] OR;  Service: Open Heart Surgery;  Laterality: N/A;    History   Social History  . Marital Status: Single    Spouse Name: N/A  . Number of Children: 2  . Years of Education: N/A   Occupational History  . circulation desk clerk    Social History Main Topics  . Smoking status: Never Smoker   . Smokeless tobacco: Never Used  . Alcohol Use: No  . Drug Use: No  . Sexual Activity: Not on file   Other Topics Concern  . Not on file   Social History Narrative    Allergies  Allergen Reactions  . Adhesive [Tape] Itching    Adhesive tape and backing (pls use elastic bandages with no adhesive)  . Penicillins Rash  . Sulfa Antibiotics Rash    No current facility-administered medications for this encounter.   Current Outpatient Prescriptions  Medication Sig Dispense Refill  . amiodarone (PACERONE) 200 MG tablet Take 2 tablets (400 mg total) by mouth 2 (two) times daily. 60 tablet 5  . aspirin EC 81 MG tablet Take 1 tablet (81 mg total) by mouth daily. (Patient taking differently: Take 81 mg by mouth daily with lunch. )    . Calcium Carb-Cholecalciferol (CALCIUM 1000 + D PO) Take 1,000 mg by mouth daily with lunch.     . cyclobenzaprine (FLEXERIL) 10 MG tablet Take 5 mg by mouth at bedtime.     . ferrous sulfate 325 (65 FE) MG tablet Take 1 tablet (325 mg total) by mouth daily with breakfast. 30 tablet  5  . furosemide (LASIX) 40 MG tablet Take 1 tablet (40 mg total) by mouth 2 (two) times daily. (Patient taking differently: Take 40 mg by mouth 2 (two) times daily. Morning and lunch time)    . levothyroxine (SYNTHROID, LEVOTHROID) 150 MCG tablet Take 150 mcg by mouth daily before breakfast.    . metoprolol  succinate (TOPROL-XL) 50 MG 24 hr tablet TAKE ONE TABLET BY MOUTH ONCE DAILY WITH A MEAL OR IMMEDIATELY FOLLOWING A MEAL. (Patient taking differently: TAKE ONE TABLET BY MOUTH ONCE DAILY AT BEDTIME) 30 tablet 3  . pantoprazole (PROTONIX) 40 MG tablet Take 1 tablet (40 mg total) by mouth daily. 30 tablet 5  . potassium chloride (K-DUR) 10 MEQ tablet Take 10 mEq by mouth daily with lunch.     . warfarin (COUMADIN) 5 MG tablet Take 2.5-5 mg by mouth daily. Take 2.5 mg (1/2 tablet) daily except 5 mg (1 tablet) on Wednesday and Friday      Family History  Problem Relation Age of Onset  . CAD Father   . Hypertension Father   . CAD Mother   . Cirrhosis Brother   . Heart attack Sister   Review of Systems:      Cardiac Review of Systems: Y or N  Chest Pain [  N  ]  Resting SOB [  N ]  Orthopnea [ N ]    Palpitations Klaus.Mock[N  ] Syncope  Klaus.Mock[N ]   Presyncope [  N ]  General Review of Systems: [Y] = yes [  ]=no Constitional: anorexia [ N ]; fatigue [ Y ]; nausea [ N ]; night sweats [ N ]; fever [ N ]; or chills Klaus.Mock[N  ]                                                             Eye : blurred vision [ N ]; diplopia [ N  ]; vision changes [  N];  Amaurosis fugax[ N ]; Resp: cough [ N ];  wheezing[ N ];  hemoptysis[ N ]; ;  GI:  vomiting[N  ];  dysphagia[ N ]; melena[N  ];  hematochezia Klaus.Mock[N  ]; heartburn[ N ];    GU: hematuria[  N];   dysuria [ N ];  nocturia[  N];   urinary frequency [ N ]             Skin: rash, swelling[ N ];, hair loss[N  ];  or itching[N ]; Musculosketetal: myalgias[ N ];  joint swelling[N  ];  joint erythema[ N ];  Heme/Lymph: bruisingY[  ];  bleeding[ N ];  anemia[Y  ];  Neuro: TIA[  N]; stroke[N  ];  vertigo[ N ];  seizures[ N ];   paresthesias[  N];  difficulty walking[  ];  Psych:depression[N  ]; anxiety[N  ];  Endocrine: diabetes[ N ];  thyroid dysfunction[ Y ];  Prediabetes [Y] (pre op HGA1C 6.2)  Physical Exam:  General appearance: alert, cooperative and no distress Head: Normocephalic, without obvious abnormality, atraumatic Neck: no adenopathy, no carotid bruit, no JVD and supple, symmetrical, trachea midline Resp: Slightly diminished at bases Cardio: RRR,sharp MV click, soft systolic murmur GI: soft, non-tender; bowel sounds normal; no masses,  no organomegaly Extremities: Mild LE edema Neurologic: Grossly normal  Diagnostic Studies & Laboratory data:     Recent Radiology Findings:   No results found.   I have independently reviewed the above radiologic studies.  Recent Lab Findings: Lab Results  Component Value Date   WBC 2.6* 10/01/2014   HGB 8.1* 10/01/2014   HCT 24.9* 10/01/2014   PLT 34* 10/01/2014   GLUCOSE 107* 10/01/2014  ALT 23 09/24/2014   AST 38 09/24/2014   NA 137 10/01/2014   K 3.7 10/01/2014   CL 105 10/01/2014   CREATININE 0.82 10/01/2014   BUN 15 10/01/2014   CO2 27 10/01/2014   INR 1.63* 10/02/2014   HGBA1C 6.2* 09/24/2014     Assessment / Plan:   1. Left groin wound infection and dehiscence 2. S/p TAVR via left femoral approach 09/28/2014.   3.Admission to [facility] for IV antibiotics. To OR in am for incision and drainage of  left groin wound and placement of wound VAC. 4. On Coumadin for mechanical MVR-will obtain PT and INR as has not been checked recently. Will hold Coumadin tonight as going to OR in am.  I  spent 25 minutes counseling the patient face to face and 50% or more the  time was spent in counseling and coordination of care. The total time spent in the appointment was 40 minutes.    Doree Fudge PA-C  10/18/2014 3:11 PM

## 2014-10-18 NOTE — Progress Notes (Signed)
10/18/2014 1530 Received to room 2w30 a direct admit with infected wound site.  Pt is A&O, no c/o voiced at this time.  Tele monitor applied, CCMD notified.  Oriented to room, call light and bed.  Call bell in reach. Kathryne HitchAllen, Illeana Edick C

## 2014-10-19 ENCOUNTER — Encounter (HOSPITAL_COMMUNITY): Payer: Self-pay | Admitting: Anesthesiology

## 2014-10-19 ENCOUNTER — Ambulatory Visit (HOSPITAL_COMMUNITY)
Admission: AD | Admit: 2014-10-19 | Payer: 59 | Source: Ambulatory Visit | Admitting: Thoracic Surgery (Cardiothoracic Vascular Surgery)

## 2014-10-19 DIAGNOSIS — T8131XA Disruption of external operation (surgical) wound, not elsewhere classified, initial encounter: Secondary | ICD-10-CM

## 2014-10-19 LAB — PROTIME-INR
INR: 4.48 — ABNORMAL HIGH (ref 0.00–1.49)
Prothrombin Time: 41.4 seconds — ABNORMAL HIGH (ref 11.6–15.2)

## 2014-10-19 MED ORDER — AMIODARONE HCL 200 MG PO TABS
200.0000 mg | ORAL_TABLET | Freq: Every day | ORAL | Status: DC
  Filled 2014-10-19 (×2): qty 1

## 2014-10-19 MED ORDER — ROCURONIUM BROMIDE 50 MG/5ML IV SOLN
INTRAVENOUS | Status: AC
  Filled 2014-10-19: qty 1

## 2014-10-19 MED ORDER — GLYCOPYRROLATE 0.2 MG/ML IJ SOLN
INTRAMUSCULAR | Status: AC
  Filled 2014-10-19: qty 1

## 2014-10-19 MED ORDER — PROPOFOL 10 MG/ML IV BOLUS
INTRAVENOUS | Status: AC
  Filled 2014-10-19: qty 20

## 2014-10-19 MED ORDER — SODIUM CHLORIDE 0.9 % IJ SOLN
INTRAMUSCULAR | Status: AC
  Filled 2014-10-19: qty 10

## 2014-10-19 MED ORDER — PHENYLEPHRINE 40 MCG/ML (10ML) SYRINGE FOR IV PUSH (FOR BLOOD PRESSURE SUPPORT)
PREFILLED_SYRINGE | INTRAVENOUS | Status: AC
  Filled 2014-10-19: qty 10

## 2014-10-19 MED ORDER — SUCCINYLCHOLINE CHLORIDE 20 MG/ML IJ SOLN
INTRAMUSCULAR | Status: AC
  Filled 2014-10-19: qty 1

## 2014-10-19 MED ORDER — EPHEDRINE SULFATE 50 MG/ML IJ SOLN
INTRAMUSCULAR | Status: AC
  Filled 2014-10-19: qty 1

## 2014-10-19 MED ORDER — MIDAZOLAM HCL 2 MG/2ML IJ SOLN
INTRAMUSCULAR | Status: AC
  Filled 2014-10-19: qty 2

## 2014-10-19 MED ORDER — LIDOCAINE HCL (CARDIAC) 20 MG/ML IV SOLN
INTRAVENOUS | Status: AC
  Filled 2014-10-19: qty 5

## 2014-10-19 MED ORDER — FENTANYL CITRATE (PF) 250 MCG/5ML IJ SOLN
INTRAMUSCULAR | Status: AC
  Filled 2014-10-19: qty 5

## 2014-10-19 NOTE — Progress Notes (Signed)
1300 Has walked twice already today and resting now. Stated will walk again later this evening. Will continue to follow. Luetta Nuttingharlene Adalynn Corne RN BSN 10/19/2014 1:00 PM

## 2014-10-19 NOTE — Progress Notes (Addendum)
      [address]       [place] [id number]             [phone]      Day of Surgery Procedure(s) (LRB): WOUND EXPLORATION (Left) APPLICATION OF WOUND VAC (Left)   Subjective:  Ms. Jamie Howell has no complaints this morning.  She is in better spirits today.  Objective: Vital signs in last 24 hours: Temp:  [97.6 F (36.4 C)-98.8 F (37.1 C)] 98.6 F (37 C) (07/19 0800) Pulse Rate:  [72-114] 82 (07/19 0800) Cardiac Rhythm:  [-] Atrial fibrillation (07/18 2022) Resp:  [16-18] 18 (07/19 0800) BP: (104-145)/(58-81) 127/79 mmHg (07/19 0800) SpO2:  [97 %-100 %] 98 % (07/19 0800) Weight:  [184 lb 4.8 oz (83.598 kg)-188 lb (85.276 kg)] 185 lb 3 oz (84 kg) (07/19 0457)  Intake/Output from previous day: 07/18 0701 - 07/19 0700 In: 240 [P.O.:240] Out: 750 [Urine:750]  General appearance: alert, cooperative and no distress Heart: regular rate and rhythm Lungs: clear to auscultation bilaterally Abdomen: soft, non-tender; bowel sounds normal; no masses,  no organomegaly and Yeast under panus Wound: Left groin wound dehisced, + foul odor, no apparent drainage present  Lab Results:  Recent Labs  10/18/14 1654  WBC 3.7*  HGB 10.9*  HCT 33.9*  PLT 60*   BMET:  Recent Labs  10/18/14 1654  NA 139  K 3.4*  CL 100*  CO2 30  GLUCOSE 205*  BUN 18  CREATININE 1.27*  CALCIUM 8.8*    PT/INR:  Recent Labs  10/19/14 0358  LABPROT 41.4*  INR 4.48*   ABG    Component Value Date/Time   PHART 7.374 09/28/2014 1132   HCO3 23.3 09/28/2014 1132   TCO2 24 09/28/2014 1132   ACIDBASEDEF 2.0 09/28/2014 1132   O2SAT 93.0 09/28/2014 1132   CBG (last 3)  No results for input(s): GLUCAP in the last 72 hours.  Assessment/Plan: S/P Procedure(s) (LRB): WOUND EXPLORATION (Left) APPLICATION OF WOUND VAC (Left)  1. Left Groin Wound- completely Dehisced, wound culture obtained in office, continue wet to dry dressing, will need OR debridement and wound vac placement  at some point 2. Yeast infection- groin and under panus- improved, continue Nystatin powder 3. INR 4.48, coumadin on hold.... Will monitor closely needs to be closer to 2 prior to OR debridement 4. ID- on Vancomycin for pneumonia, Ciprofloxacin for wound infection 5. Dispo- patient stable, to OR once INR appropriate   LOS: 1 day    BARRETT, ERIN 10/19/2014  I have seen and examined the patient and agree with the assessment and plan as outlined.  Will postpone wound debridement and possible proceed tomorrow or Thursday once her INR has drifted down.  Start BID dressing changes until wound VAC placed.  Will not actively reverse warfarin due to presence of mechanical mitral valve prosthesis.  Decrease amiodarone to 200 mg daily.    Mobilize and resume cardiac rehab  I spent in excess of 15 minutes during the conduct of this hospital encounter and >50% of this time involved direct face-to-face encounter with the patient for counseling and/or coordination of their care.  Jamie Howell 10/19/2014 10:40 AM

## 2014-10-20 ENCOUNTER — Inpatient Hospital Stay (HOSPITAL_COMMUNITY): Payer: 59 | Admitting: Anesthesiology

## 2014-10-20 ENCOUNTER — Encounter (HOSPITAL_COMMUNITY)
Admission: AD | Disposition: A | Discharge status: Home Health | Payer: 59 | Source: Ambulatory Visit | Attending: Thoracic Surgery (Cardiothoracic Vascular Surgery)

## 2014-10-20 ENCOUNTER — Encounter (HOSPITAL_COMMUNITY): Payer: Self-pay | Admitting: Anesthesiology

## 2014-10-20 DIAGNOSIS — T8149XA Infection following a procedure, other surgical site, initial encounter: Secondary | ICD-10-CM | POA: Diagnosis present

## 2014-10-20 HISTORY — PX: APPLICATION OF WOUND VAC: SHX5189

## 2014-10-20 HISTORY — PX: WOUND EXPLORATION: SHX6188

## 2014-10-20 LAB — PROTIME-INR
INR: 2.72 — AB (ref 0.00–1.49)
Prothrombin Time: 28.4 seconds — ABNORMAL HIGH (ref 11.6–15.2)

## 2014-10-20 LAB — SURGICAL PCR SCREEN
MRSA, PCR: NEGATIVE
Staphylococcus aureus: NEGATIVE

## 2014-10-20 SURGERY — WOUND EXPLORATION
Anesthesia: General | Site: Groin | Laterality: Left

## 2014-10-20 MED ORDER — FENTANYL CITRATE (PF) 100 MCG/2ML IJ SOLN
INTRAMUSCULAR | Status: DC | PRN
  Administered 2014-10-20: 50 ug via INTRAVENOUS

## 2014-10-20 MED ORDER — MIDAZOLAM HCL 2 MG/2ML IJ SOLN
INTRAMUSCULAR | Status: AC
  Filled 2014-10-20: qty 2

## 2014-10-20 MED ORDER — OXYCODONE HCL 5 MG PO TABS: 5.0000 mg | ORAL_TABLET | Freq: Once | ORAL | Status: AC | PRN

## 2014-10-20 MED ORDER — SODIUM CHLORIDE 0.9 % IV SOLN
INTRAVENOUS | Status: DC | PRN
  Administered 2014-10-20: 08:00:00 via INTRAVENOUS

## 2014-10-20 MED ORDER — ROCURONIUM BROMIDE 50 MG/5ML IV SOLN
INTRAVENOUS | Status: AC
  Filled 2014-10-20: qty 1

## 2014-10-20 MED ORDER — OXYCODONE HCL 5 MG PO TABS: 5.0000 mg | ORAL_TABLET | ORAL | Status: DC | PRN

## 2014-10-20 MED ORDER — LIDOCAINE HCL (CARDIAC) 20 MG/ML IV SOLN
INTRAVENOUS | Status: DC | PRN
  Administered 2014-10-20: 60 mg via INTRAVENOUS

## 2014-10-20 MED ORDER — DEXAMETHASONE SODIUM PHOSPHATE 4 MG/ML IJ SOLN
INTRAMUSCULAR | Status: AC
  Filled 2014-10-20: qty 2

## 2014-10-20 MED ORDER — 0.9 % SODIUM CHLORIDE (POUR BTL) OPTIME
TOPICAL | Status: DC | PRN
  Administered 2014-10-20: 1000 mL

## 2014-10-20 MED ORDER — PROPOFOL 10 MG/ML IV BOLUS
INTRAVENOUS | Status: DC | PRN
  Administered 2014-10-20 (×2): 10 mg via INTRAVENOUS

## 2014-10-20 MED ORDER — OXYCODONE HCL 5 MG/5ML PO SOLN: 5.0000 mg | Freq: Once | ORAL | Status: AC | PRN

## 2014-10-20 MED ORDER — ONDANSETRON HCL 4 MG/2ML IJ SOLN
INTRAMUSCULAR | Status: AC
  Filled 2014-10-20: qty 2

## 2014-10-20 MED ORDER — FENTANYL CITRATE (PF) 100 MCG/2ML IJ SOLN: 25.0000 ug | INTRAMUSCULAR | Status: DC | PRN

## 2014-10-20 MED ORDER — ACETAMINOPHEN 650 MG RE SUPP: 650.0000 mg | RECTAL | Status: DC | PRN

## 2014-10-20 MED ORDER — PROPOFOL 10 MG/ML IV BOLUS
INTRAVENOUS | Status: AC
  Filled 2014-10-20: qty 20

## 2014-10-20 MED ORDER — LIDOCAINE HCL (CARDIAC) 20 MG/ML IV SOLN
INTRAVENOUS | Status: AC
  Filled 2014-10-20: qty 5

## 2014-10-20 MED ORDER — FENTANYL CITRATE (PF) 250 MCG/5ML IJ SOLN
INTRAMUSCULAR | Status: AC
Start: 2014-10-20 — End: 2014-10-20
  Filled 2014-10-20: qty 5

## 2014-10-20 MED ORDER — ONDANSETRON HCL 4 MG/2ML IJ SOLN: 4.0000 mg | Freq: Once | INTRAMUSCULAR | Status: AC | PRN

## 2014-10-20 MED ORDER — ACETAMINOPHEN 325 MG PO TABS
650.0000 mg | ORAL_TABLET | ORAL | Status: DC | PRN
  Administered 2014-10-24: 650 mg via ORAL
  Filled 2014-10-20: qty 2

## 2014-10-20 MED ORDER — MIDAZOLAM HCL 5 MG/5ML IJ SOLN
INTRAMUSCULAR | Status: DC | PRN
  Administered 2014-10-20: 2 mg via INTRAVENOUS

## 2014-10-20 SURGICAL SUPPLY — 35 items
BANDAGE ELASTIC 4 VELCRO ST LF (GAUZE/BANDAGES/DRESSINGS) IMPLANT
BANDAGE ELASTIC 6 VELCRO ST LF (GAUZE/BANDAGES/DRESSINGS) IMPLANT
BNDG GAUZE ELAST 4 BULKY (GAUZE/BANDAGES/DRESSINGS) IMPLANT
CANISTER SUCTION 2500CC (MISCELLANEOUS) ×2 IMPLANT
CANISTER WOUND CARE 500ML ATS (WOUND CARE) ×2 IMPLANT
COVER SURGICAL LIGHT HANDLE (MISCELLANEOUS) ×4 IMPLANT
DRAPE ORTHO SPLIT 77X108 STRL (DRAPES) ×2
DRAPE PROXIMA HALF (DRAPES) ×4 IMPLANT
DRAPE SURG ORHT 6 SPLT 77X108 (DRAPES) ×1 IMPLANT
DRSG VAC ATS SM SENSATRAC (GAUZE/BANDAGES/DRESSINGS) ×2 IMPLANT
ELECT REM PT RETURN 9FT ADLT (ELECTROSURGICAL) ×2
ELECTRODE REM PT RTRN 9FT ADLT (ELECTROSURGICAL) ×1 IMPLANT
GAUZE SPONGE 4X4 12PLY STRL (GAUZE/BANDAGES/DRESSINGS) ×2 IMPLANT
GLOVE BIO SURGEON STRL SZ 6.5 (GLOVE) ×2 IMPLANT
GLOVE BIOGEL PI IND STRL 6.5 (GLOVE) ×1 IMPLANT
GLOVE BIOGEL PI INDICATOR 6.5 (GLOVE) ×1
GOWN STRL REUS W/ TWL LRG LVL3 (GOWN DISPOSABLE) ×2 IMPLANT
GOWN STRL REUS W/TWL LRG LVL3 (GOWN DISPOSABLE) ×4
KIT BASIN OR (CUSTOM PROCEDURE TRAY) ×2 IMPLANT
KIT ROOM TURNOVER OR (KITS) ×2 IMPLANT
NS IRRIG 1000ML POUR BTL (IV SOLUTION) ×2 IMPLANT
PACK GENERAL/GYN (CUSTOM PROCEDURE TRAY) ×2 IMPLANT
PAD ARMBOARD 7.5X6 YLW CONV (MISCELLANEOUS) ×4 IMPLANT
STAPLER VISISTAT 35W (STAPLE) IMPLANT
SUT MNCRL AB 4-0 PS2 18 (SUTURE) IMPLANT
SUT PDS AB 1 CTX 36 (SUTURE) IMPLANT
SUT VIC AB 2-0 CTX 36 (SUTURE) IMPLANT
SUT VIC AB 3-0 SH 27 (SUTURE)
SUT VIC AB 3-0 SH 27X BRD (SUTURE) IMPLANT
SUT VIC AB 3-0 SH 8-18 (SUTURE) ×2 IMPLANT
SWAB COLLECTION DEVICE MRSA (MISCELLANEOUS) ×2 IMPLANT
TOWEL OR 17X24 6PK STRL BLUE (TOWEL DISPOSABLE) ×2 IMPLANT
TOWEL OR 17X26 10 PK STRL BLUE (TOWEL DISPOSABLE) ×2 IMPLANT
TUBE CONNECTING 12X1/4 (SUCTIONS) ×2 IMPLANT
WATER STERILE IRR 1000ML POUR (IV SOLUTION) ×2 IMPLANT

## 2014-10-20 NOTE — Anesthesia Preprocedure Evaluation (Signed)
Anesthesia Evaluation  Patient identified by MRN, date of birth, ID band Patient awake    Reviewed: Allergy & Precautions, NPO status , Patient's Chart, lab work & pertinent test results  Airway Mallampati: II  TM Distance: >3 FB Neck ROM: Full    Dental  (+) Edentulous Upper, Edentulous Lower   Pulmonary  breath sounds clear to auscultation        Cardiovascular hypertension, Rhythm:Regular Rate:Normal + Systolic Click    Neuro/Psych    GI/Hepatic   Endo/Other    Renal/GU      Musculoskeletal   Abdominal (+) + obese,   Peds  Hematology   Anesthesia Other Findings   Reproductive/Obstetrics                             Anesthesia Physical Anesthesia Plan  ASA: III  Anesthesia Plan: General   Post-op Pain Management:    Induction: Intravenous  Airway Management Planned: LMA  Additional Equipment:   Intra-op Plan:   Post-operative Plan:   Informed Consent: I have reviewed the patients History and Physical, chart, labs and discussed the procedure including the risks, benefits and alternatives for the proposed anesthesia with the patient or authorized representative who has indicated his/her understanding and acceptance.     Plan Discussed with: CRNA and Anesthesiologist  Anesthesia Plan Comments: (S/P TAVR 09/27/24 now with post-op L. Groin infection S/P MVR 1993 for rheumatic disease on coumadin INR 2.72 H/O portal htn and splenomegaly Chronic thrombocytopenia platelet count 60,000,  Renal insufficiency Cr 1.27 H/O Paroxysmal afib on amiodarone now in Afib  Jamie Howell   )        Anesthesia Quick Evaluation

## 2014-10-20 NOTE — Progress Notes (Signed)
MD paged regarding pt INR level. MD will update nurse if pt will have procedure today or not. Will pass information to day shift RN.  Sandrea HammondJunris Richell Corker RN

## 2014-10-20 NOTE — Transfer of Care (Signed)
Immediate Anesthesia Transfer of Care Note  Patient: Jamie Howell  Procedure(s) Performed: Procedure(s): WOUND EXPLORATION (Left) APPLICATION OF WOUND VAC (Left)  Patient Location: PACU  Anesthesia Type:MAC  Level of Consciousness: awake, alert , oriented and patient cooperative  Airway & Oxygen Therapy: Patient Spontanous Breathing and Patient connected to nasal cannula oxygen  Post-op Assessment: Report given to RN and Post -op Vital signs reviewed and stable  Post vital signs: Reviewed and stable  Last Vitals:  Filed Vitals:   10/20/14 0327  BP: 104/61  Pulse: 62  Temp: 36.8 C  Resp: 18    Complications: No apparent anesthesia complications

## 2014-10-20 NOTE — Op Note (Signed)
CARDIOTHORACIC SURGERY OPERATIVE NOTE  Date of Procedure:   10/20/2014  Preoperative Diagnosis:  Left Groin Wound Infection  Postoperative Diagnosis:  same  Procedure:    Excisional Debridement Left Groin Wound and Placement of Wound Vacuum Assist Closure Device  Surgeon:    Salvatore Decentlarence H. Cornelius Moraswen, MD  Assistant:    Frutoso Schatziana Acuna, CRNFA  Anesthesia:    Intravenous Sedation with Monitored Anesthesia Care      BRIEF CLINICAL NOTE AND INDICATIONS FOR SURGERY  It is a 64 year old morbidly obese female with complex past medical history who underwent transcatheter aortic valve replacement via open left transfemoral approach for severe symptomatic aortic stenosis on 09/28/2014. She will return to the office for follow-up on 10/18/2014 and was found to have an obvious wound infection with fat necrosis involving her left groin wound incision. She was admitted to the hospital and is brought to the operating room for surgical debridement.     DETAILS OF THE OPERATIVE PROCEDURE  The patient is brought to the operating room on the above mentioned date and placed in the supine position on the operative table. Light intravenous sedation is administered by the anesthesia team under the care and direction of Dr. Kipp Broodavid Joslin. The patient's existing wound dressing is removed and the patient left groin is prepared and draped. The wound is explored and there is a fair amount of fat necrosis in the deep subcutaneous tissues. All necrotic tissues excised sharply. Wound swab culture and a portion of tissue are both sent for routine culture. Once all devitalized tissue has been debridement the wound is irrigated with copious saline solution. A vacuum assist closure sponge is trimmed to an appropriate size and placed in the wound. Continuous vacuum suction is administered and the dressing verified intact. The patient tolerated the procedure well and was transported to the postanesthesia care unit in stable condition.  There are no intraoperative complications. Estimated blood loss was trivial.     Salvatore Decentlarence H. Cornelius Moraswen MD 10/20/2014 9:38 AM

## 2014-10-20 NOTE — Progress Notes (Signed)
CARDIAC REHAB PHASE I   Pt lying in bed s/p placement of wound vac to L groin. Pt states she got up to the bathroom with assist x2 and walker and felt like she had "sea legs." Pt states she is tired and feels unsteady and would prefer to walk tomorrow.  Pt encouraged to walk later today if able. Pt verbalized understanding. Pt in bed, son at bedside, call bell within reach.   Joylene GrapesMonge, Brittnei Jagiello C, RN, BSN 10/20/2014 2:17 PM

## 2014-10-20 NOTE — Care Management Note (Signed)
Case Management Note Donn PieriniKristi Gissela Bloch RN, BSN Unit 2W-Case Manager [phone]  Patient Details  Name: Jamie Howell MRN: [id number] Date of Birth: 02/28/1951  Subjective/Objective:     Pt admitted with wound infection- Left Groin Wound- completely Dehisced- to OR today for exploration/debridement and placement of wound VAC               Action/Plan: PTA pt lived at home - anticipate pt will need HH services -HH-RN for wound VAC- have placed wound VAC form on shadow chart for MD signature- will also need wound measurements and orders for home wound VAC and HH-RN. NCM to follow for d/c needs  Expected Discharge Date:                  Expected Discharge Plan:  Home with home health services  In-House Referral:     Discharge planning Services  CM Consult  Post Acute Care Choice:    Choice offered to:     DME Arranged:    DME Agency:     HH Arranged:    HH Agency:     Status of Service:  In process, will continue to follow  Medicare Important Message Given:    Date Medicare IM Given:    Medicare IM give by:    Date Additional Medicare IM Given:    Additional Medicare Important Message give by:     If discussed at Long Length of Stay Meetings, dates discussed:    Additional Comments:  Darrold SpanWebster, Dannae Kato Hall, RN 10/20/2014, 11:47 AM

## 2014-10-20 NOTE — Anesthesia Postprocedure Evaluation (Signed)
  Anesthesia Post-op Note  Patient: Jamie Howell  Procedure(s) Performed: Procedure(s): WOUND EXPLORATION (Left) APPLICATION OF WOUND VAC (Left)  Patient Location: PACU  Anesthesia Type:MAC  Level of Consciousness: awake, alert  and oriented  Airway and Oxygen Therapy: Patient Spontanous Breathing  Post-op Pain: mild  Post-op Assessment: Post-op Vital signs reviewed, Patient's Cardiovascular Status Stable, Respiratory Function Stable, Patent Airway, No signs of Nausea or vomiting and Pain level controlled              Post-op Vital Signs: stable  Last Vitals:  Filed Vitals:   10/20/14 1749  BP: 119/69  Pulse: 81  Temp:   Resp:     Complications: No apparent anesthesia complications

## 2014-10-20 NOTE — Progress Notes (Signed)
Utilization review completed.  

## 2014-10-21 ENCOUNTER — Encounter (HOSPITAL_COMMUNITY): Payer: Self-pay | Admitting: Thoracic Surgery (Cardiothoracic Vascular Surgery)

## 2014-10-21 LAB — BASIC METABOLIC PANEL
Anion gap: 9 (ref 5–15)
BUN: 19 mg/dL (ref 6–20)
CO2: 24 mmol/L (ref 22–32)
Calcium: 8.4 mg/dL — ABNORMAL LOW (ref 8.9–10.3)
Chloride: 101 mmol/L (ref 101–111)
Creatinine, Ser: 1.17 mg/dL — ABNORMAL HIGH (ref 0.44–1.00)
GFR, EST AFRICAN AMERICAN: 56 mL/min — AB (ref >60)
GFR, EST NON AFRICAN AMERICAN: 48 mL/min — AB (ref >60)
Glucose, Bld: 114 mg/dL — ABNORMAL HIGH (ref 65–99)
Potassium: 4.5 mmol/L (ref 3.5–5.1)
SODIUM: 134 mmol/L — AB (ref 135–145)

## 2014-10-21 LAB — WOUND CULTURE: Gram Stain: NONE SEEN

## 2014-10-21 LAB — PROTIME-INR
INR: 1.89 — AB (ref 0.00–1.49)
Prothrombin Time: 21.6 seconds — ABNORMAL HIGH (ref 11.6–15.2)

## 2014-10-21 LAB — CBC
HEMATOCRIT: 31.6 % — AB (ref 36.0–46.0)
HEMOGLOBIN: 10.1 g/dL — AB (ref 12.0–15.0)
MCH: 29.4 pg (ref 26.0–34.0)
MCHC: 32 g/dL (ref 30.0–36.0)
MCV: 91.9 fL (ref 78.0–100.0)
PLATELETS: 48 10*3/uL — AB (ref 150–400)
RBC: 3.44 MIL/uL — ABNORMAL LOW (ref 3.87–5.11)
RDW: 21.5 % — AB (ref 11.5–15.5)
WBC: 3.7 10*3/uL — AB (ref 4.0–10.5)

## 2014-10-21 MED ORDER — POTASSIUM CHLORIDE ER 10 MEQ PO TBCR
10.0000 meq | EXTENDED_RELEASE_TABLET | Freq: Every day | ORAL | Status: DC
  Administered 2014-10-21 – 2014-10-24 (×4): 10 meq via ORAL
  Filled 2014-10-21 (×5): qty 1

## 2014-10-21 MED ORDER — WARFARIN SODIUM 2 MG PO TABS
2.0000 mg | ORAL_TABLET | Freq: Once | ORAL | Status: DC
  Filled 2014-10-21: qty 1

## 2014-10-21 MED ORDER — FUROSEMIDE 40 MG PO TABS
40.0000 mg | ORAL_TABLET | Freq: Two times a day (BID) | ORAL | Status: DC
  Administered 2014-10-21 – 2014-10-25 (×9): 40 mg via ORAL
  Filled 2014-10-21 (×11): qty 1

## 2014-10-21 MED ORDER — AMLODIPINE BESYLATE 2.5 MG PO TABS
2.5000 mg | ORAL_TABLET | Freq: Every day | ORAL | Status: DC
  Administered 2014-10-22 – 2014-10-25 (×4): 2.5 mg via ORAL
  Filled 2014-10-21 (×5): qty 1

## 2014-10-21 MED ORDER — AMIODARONE HCL 200 MG PO TABS
400.0000 mg | ORAL_TABLET | Freq: Two times a day (BID) | ORAL | Status: DC
  Administered 2014-10-21 – 2014-10-25 (×9): 400 mg via ORAL
  Filled 2014-10-21 (×11): qty 2

## 2014-10-21 MED ORDER — PANTOPRAZOLE SODIUM 40 MG PO TBEC
40.0000 mg | DELAYED_RELEASE_TABLET | Freq: Every day | ORAL | Status: DC
  Administered 2014-10-22 – 2014-10-25 (×4): 40 mg via ORAL
  Filled 2014-10-21 (×4): qty 1

## 2014-10-21 MED ORDER — ASPIRIN EC 81 MG PO TBEC
81.0000 mg | DELAYED_RELEASE_TABLET | Freq: Every day | ORAL | Status: DC
  Administered 2014-10-21 – 2014-10-25 (×5): 81 mg via ORAL
  Filled 2014-10-21 (×5): qty 1

## 2014-10-21 MED ORDER — FERROUS SULFATE 325 (65 FE) MG PO TABS
325.0000 mg | ORAL_TABLET | Freq: Every day | ORAL | Status: DC
  Administered 2014-10-22 – 2014-10-25 (×4): 325 mg via ORAL
  Filled 2014-10-21 (×6): qty 1

## 2014-10-21 MED ORDER — METOPROLOL SUCCINATE ER 50 MG PO TB24
50.0000 mg | ORAL_TABLET | Freq: Every day | ORAL | Status: DC
  Administered 2014-10-22 – 2014-10-24 (×3): 50 mg via ORAL
  Filled 2014-10-21 (×6): qty 1

## 2014-10-21 MED ORDER — CIPROFLOXACIN HCL 500 MG PO TABS
500.0000 mg | ORAL_TABLET | Freq: Two times a day (BID) | ORAL | Status: DC
  Administered 2014-10-21 – 2014-10-24 (×7): 500 mg via ORAL
  Filled 2014-10-21 (×8): qty 1

## 2014-10-21 MED ORDER — CYCLOBENZAPRINE HCL 5 MG PO TABS
5.0000 mg | ORAL_TABLET | Freq: Every day | ORAL | Status: DC
  Administered 2014-10-21 – 2014-10-24 (×4): 5 mg via ORAL
  Filled 2014-10-21 (×5): qty 1

## 2014-10-21 MED ORDER — LEVOTHYROXINE SODIUM 150 MCG PO TABS
150.0000 ug | ORAL_TABLET | Freq: Every day | ORAL | Status: DC
  Administered 2014-10-22 – 2014-10-24 (×3): 150 ug via ORAL
  Filled 2014-10-21 (×5): qty 1

## 2014-10-21 MED ORDER — WARFARIN SODIUM 2.5 MG PO TABS
2.5000 mg | ORAL_TABLET | Freq: Every day | ORAL | Status: DC
  Administered 2014-10-21: 2.5 mg via ORAL
  Filled 2014-10-21 (×3): qty 1

## 2014-10-21 MED ORDER — OXYCODONE-ACETAMINOPHEN 5-325 MG PO TABS: 0.5000 | ORAL_TABLET | Freq: Every evening | ORAL | Status: DC | PRN

## 2014-10-21 MED ORDER — WARFARIN - PHYSICIAN DOSING INPATIENT
Freq: Every day | Status: DC
  Administered 2014-10-21 – 2014-10-22 (×2)

## 2014-10-21 NOTE — Progress Notes (Signed)
ANTIBIOTIC CONSULT NOTE - FOLLOW UP  Pharmacy Consult for Vancomycin Indication: wound infection  Allergies  Allergen Reactions  . Adhesive [Tape] Itching    Adhesive tape and backing (pls use elastic bandages with no adhesive)  . Penicillins Rash  . Sulfa Antibiotics Rash    Patient Measurements: Height:  (149.9 cm) Weight: 187 lb 3.2 oz (84.913 kg) IBW/kg (Calculated) : 43.2  Vital Signs: Temp: 97.4 F (36.3 C) (07/21 0542) Temp Source: Oral (07/21 0542) BP: 100/59 mmHg (07/21 0945) Pulse Rate: 75 (07/21 0945) Intake/Output from previous day: 07/20 0701 - 07/21 0700 In: 350 [P.O.:50; I.V.:300] Out: 1570 [Urine:1550; Blood:20] Intake/Output from this shift: Total I/O In: 240 [P.O.:240] Out: 300 [Urine:300]  Labs:  Recent Labs  10/18/14 1654 10/21/14 0318  WBC 3.7* 3.7*  HGB 10.9* 10.1*  PLT 60* 48*  CREATININE 1.27* 1.17*   Estimated Creatinine Clearance: 45.9 mL/min (by C-G formula based on Cr of 1.17).  Microbiology: Recent Results (from the past 720 hour(s))  Surgical pcr screen     Status: Abnormal   Collection Time: 09/24/14 10:43 AM  Result Value Ref Range Status   MRSA, PCR NEGATIVE NEGATIVE Final   Staphylococcus aureus POSITIVE (A) NEGATIVE Final    Comment:        The Xpert SA Assay (FDA approved for NASAL specimens in patients over 59 years of age), is one component of a comprehensive surveillance program.  Test performance has been validated by [facility] for patients greater than or equal to 87 year old. It is not intended to diagnose infection nor to guide or monitor treatment.   Wound culture     Status: None   Collection Time: 10/18/14  3:00 PM  Result Value Ref Range Status   Gram Stain Few  Final   Gram Stain WBC present-both PMN and Mononuclear  Final   Gram Stain No Squamous Epithelial Cells Seen  Final   Gram Stain Abundant Gram Positive Cocci In Pairs In Chains  Final   Gram Stain Few Gram Negative Rods  Final    Gram Stain Rare Gram Positive Rods  Final   Organism ID, Bacteria Multiple Organisms Present,None Predominant  Final    Comment: No Staphylococcus aureus isolated NO GROUP A STREP (S. PYOGENES) ISOLATED   Surgical pcr screen     Status: None   Collection Time: 10/20/14  6:47 AM  Result Value Ref Range Status   MRSA, PCR NEGATIVE NEGATIVE Final   Staphylococcus aureus NEGATIVE NEGATIVE Final    Comment:        The Xpert SA Assay (FDA approved for NASAL specimens in patients over 41 years of age), is one component of a comprehensive surveillance program.  Test performance has been validated by [facility] for patients greater than or equal to 35 year old. It is not intended to diagnose infection nor to guide or monitor treatment.   Wound culture     Status: None (Preliminary result)   Collection Time: 10/20/14  9:00 AM  Result Value Ref Range Status   Specimen Description WOUND GROIN LEFT  Final   Special Requests PT ON VANCOMYCIN,CIPRO SPEC A ON SWAB  Final   Gram Stain   Final    NO WBC SEEN NO SQUAMOUS EPITHELIAL CELLS SEEN MODERATE GRAM POSITIVE COCCI IN PAIRS MODERATE GRAM NEGATIVE RODS Performed at [facility]    Culture   Final    NO GROWTH 1 DAY Performed at [facility]  Report Status PENDING  Incomplete  Tissue culture     Status: None (Preliminary result)   Collection Time: 10/20/14  9:21 AM  Result Value Ref Range Status   Specimen Description TISSUE LEFT GROIN  Final   Special Requests SPECIMEN B IN CUP  Final   Gram Stain   Final    RARE WBC PRESENT, PREDOMINANTLY MONONUCLEAR FEW GRAM NEGATIVE RODS Performed at [facility]    Culture   Final    Culture reincubated for better growth Performed at [facility]    Report Status PENDING  Incomplete   Assessment:  Day # 4 Vanc and Cipro for left groin wound infection. POD# 1 debridement and wound VAC placement.  Cipro stopped this am.  Afebrile, WBC 3.7, low stable.     Empiric Vanc dose remains appropriate for renal function.  Cultures pending.   Goal of Therapy:  Vancomycin trough level 15-20 mcg/ml  Plan:   Continue Vancomycin 750 mg IV q12hrs.  Will await final cultures before checking Vanc trough level.  Follow renal function, culture data, progress.  Dennie Fetters, [place] Pager: [phone] 10/21/2014,11:08 AM

## 2014-10-21 NOTE — Progress Notes (Addendum)
[address]       [place] [address]             [phone]      1 Day Post-Op Procedure(s) (LRB): WOUND EXPLORATION (Left) APPLICATION OF WOUND VAC (Left) Subjective: Incision feels better  Objective: Vital signs in last 24 hours: Temp:  [97.4 F (36.3 C)-97.9 F (36.6 C)] 97.4 F (36.3 C) (07/21 0542) Pulse Rate:  [67-82] 82 (07/21 0542) Cardiac Rhythm:  [-] Atrial flutter (07/20 1945) Resp:  [14-22] 18 (07/21 0542) BP: (92-126)/(47-76) 102/64 mmHg (07/21 0542) SpO2:  [96 %-100 %] 100 % (07/21 0542) Weight:  [187 lb 3.2 oz (84.913 kg)] 187 lb 3.2 oz (84.913 kg) (07/21 0542)  Hemodynamic parameters for last 24 hours:    Intake/Output from previous day: 07/20 0701 - 07/21 0700 In: 350 [P.O.:50; I.V.:300] Out: 1570 [Urine:1550; Blood:20] Intake/Output this shift:    General appearance: alert, cooperative and no distress Heart: irregularly irregular rhythm Lungs: clear to auscultation bilaterally Extremities: varicose veins noted Wound: vac in place, no surrounding erethema  Lab Results:  Recent Labs  10/18/14 1654 10/21/14 0318  WBC 3.7* 3.7*  HGB 10.9* 10.1*  HCT 33.9* 31.6*  PLT 60* 48*   BMET:  Recent Labs  10/18/14 1654 10/21/14 0318  NA 139 134*  K 3.4* 4.5  CL 100* 101  CO2 30 24  GLUCOSE 205* 114*  BUN 18 19  CREATININE 1.27* 1.17*  CALCIUM 8.8* 8.4*    PT/INR:  Recent Labs  10/21/14 0318  LABPROT 21.6*  INR 1.89*   ABG    Component Value Date/Time   PHART 7.374 09/28/2014 1132   HCO3 23.3 09/28/2014 1132   TCO2 24 09/28/2014 1132   ACIDBASEDEF 2.0 09/28/2014 1132   O2SAT 93.0 09/28/2014 1132   CBG (last 3)  No results for input(s): GLUCAP in the last 72 hours.  Meds Scheduled Meds: . amiodarone  200 mg Oral Daily  . aspirin EC  81 mg Oral Daily  . ciprofloxacin  400 mg Intravenous Q12H  . cyclobenzaprine  5 mg Oral QHS  . docusate sodium  100 mg Oral Daily  . ferrous sulfate  325 mg Oral Q  breakfast  . furosemide  40 mg Oral BID  . levothyroxine  150 mcg Oral QAC breakfast  . metoprolol succinate  50 mg Oral QPM  . nystatin   Topical BID  . pantoprazole  40 mg Oral QAC breakfast  . potassium chloride  20 mEq Oral BID  . sodium chloride  3 mL Intravenous Q12H  . vancomycin  750 mg Intravenous Q12H   Results for orders placed or performed during the hospital encounter of 10/18/14  Surgical pcr screen     Status: None   Collection Time: 10/20/14  6:47 AM  Result Value Ref Range Status   MRSA, PCR NEGATIVE NEGATIVE Final   Staphylococcus aureus NEGATIVE NEGATIVE Final    Comment:        The Xpert SA Assay (FDA approved for NASAL specimens in patients over 43 years of age), is one component of a comprehensive surveillance program.  Test performance has been validated by [facility] for patients greater than or equal to 58 year old. It is not intended to diagnose infection nor to guide or monitor treatment.    Continuous Infusions:  PRN Meds:.acetaminophen **OR** acetaminophen, alum & mag hydroxide-simeth, bisacodyl, fentaNYL (SUBLIMAZE) injection, ondansetron **OR** ondansetron (ZOFRAN) IV, oxyCODONE  Xrays No results found.  Assessment/Plan: S/P Procedure(s) (LRB): WOUND EXPLORATION (Left) APPLICATION OF WOUND VAC (Left)  1 doing well overall after placement of vac 2 conts vanc and cipro pending cultures 3 restart coumadin, low dose for now while on cipro 4 thrombocytopenia, chronic 5 afib rate controlled  LOS: 3 days    Jamie Howell 10/21/2014  Results of wound culture obtained in the office 10/18/2014: Organism ID, Bacteria Multiple Organisms Present,None Predominant   Comments: No Staphylococcus aureus isolated  NO GROUP A STREP (S. PYOGENES) ISOLATED          I have seen and examined the patient and agree with the assessment and plan as outlined.  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    Jamie Howell 10/21/2014 8:41 AM

## 2014-10-21 NOTE — Progress Notes (Signed)
CARDIAC REHAB PHASE I   PRE:  Rate/Rhythm: 104 afib  BP:  Supine:   Sitting: 118/73  Standing:    SaO2: 99%RA  MODE:  Ambulation: 500 ft   POST:  Rate/Rhythm: 84- 106 afib  BP:  Supine:   Sitting: 131/79  Standing:     SaO2: hands cool. Would not register 0954-102 Pt walked 545ft on RA with rolling walker with asst x 2 with steady gait. Can be asst x 1 next walk. To recliner after walk. Very motivated. No c/o groin pain.   Luetta Nutting, RN BSN  10/21/2014 10:17 AM

## 2014-10-22 DIAGNOSIS — T8131XA Disruption of external operation (surgical) wound, not elsewhere classified, initial encounter: Secondary | ICD-10-CM

## 2014-10-22 LAB — PROTIME-INR
INR: 1.63 — AB (ref 0.00–1.49)
PROTHROMBIN TIME: 19.4 s — AB (ref 11.6–15.2)

## 2014-10-22 MED ORDER — WARFARIN SODIUM 2.5 MG PO TABS
2.5000 mg | ORAL_TABLET | Freq: Every day | ORAL | Status: DC
  Administered 2014-10-23 – 2014-10-24 (×2): 2.5 mg via ORAL
  Filled 2014-10-22 (×3): qty 1

## 2014-10-22 MED ORDER — WARFARIN SODIUM 5 MG PO TABS
5.0000 mg | ORAL_TABLET | Freq: Once | ORAL | Status: AC
  Administered 2014-10-22: 5 mg via ORAL
  Filled 2014-10-22: qty 1

## 2014-10-22 NOTE — Progress Notes (Signed)
CARDIAC REHAB PHASE I   PRE:  Rate/Rhythm: 83 SR  BP:  Sitting: 139/96        SaO2: 99 RA  MODE:  Ambulation: 550 ft   POST:  Rate/Rhythm: 89 SR  BP:  Sitting: 139/93         SaO2: UTA-pt fingers too cold to read  Pt up in room standing at sink washing up independently this morning. Pt eager to walk. Pt ambulated 550 ft on RA, rolling walker, standby assist, wound vac, steady gait, tolerated well. Pt denies CP,dizziness, DOE, declined rest stop. Pt encouraged to ambulate again, states she has been walking with her son in the afternoons. Pt to recliner after walk, feet elevated, call bell within reach.   [phone]  Joylene Grapes, RN, BSN 10/22/2014 10:18 AM

## 2014-10-22 NOTE — Progress Notes (Addendum)
[address]       [place] [address]             [phone]      2 Days Post-Op Procedure(s) (LRB): WOUND EXPLORATION (Left) APPLICATION OF WOUND VAC (Left) Subjective: Feels pretty well  Objective: Vital signs in last 24 hours: Temp:  [97.4 F (36.3 C)-98.1 F (36.7 C)] 97.8 F (36.6 C) (07/22 0349) Pulse Rate:  [75-85] 82 (07/22 0349) Cardiac Rhythm:  [-] Atrial flutter (07/21 1019) Resp:  [18] 18 (07/22 0349) BP: (100-139)/(59-71) 100/62 mmHg (07/22 0349) SpO2:  [97 %-100 %] 97 % (07/22 0349) Weight:  [188 lb (85.276 kg)] 188 lb (85.276 kg) (07/22 0349)  Hemodynamic parameters for last 24 hours:    Intake/Output from previous day: 07/21 0701 - 07/22 0700 In: 650 [P.O.:480; I.V.:20; IV Piggyback:150] Out: 400 [Urine:400] Intake/Output this shift:    General appearance: alert, cooperative and no distress Heart: irregularly irregular rhythm Lungs: clear to auscultation bilaterally Abdomen: benign Extremities: varicose veins noted Wound: incis  with Vac in place, no surrounding wrethema  Lab Results:  Recent Labs  10/21/14 0318  WBC 3.7*  HGB 10.1*  HCT 31.6*  PLT 48*   BMET:  Recent Labs  10/21/14 0318  NA 134*  K 4.5  CL 101  CO2 24  GLUCOSE 114*  BUN 19  CREATININE 1.17*  CALCIUM 8.4*    PT/INR:  Recent Labs  10/22/14 0334  LABPROT 19.4*  INR 1.63*   ABG    Component Value Date/Time   PHART 7.374 09/28/2014 1132   HCO3 23.3 09/28/2014 1132   TCO2 24 09/28/2014 1132   ACIDBASEDEF 2.0 09/28/2014 1132   O2SAT 93.0 09/28/2014 1132   Results for orders placed or performed during the hospital encounter of 10/18/14  Surgical pcr screen     Status: None   Collection Time: 10/20/14  6:47 AM  Result Value Ref Range Status   MRSA, PCR NEGATIVE NEGATIVE Final   Staphylococcus aureus NEGATIVE NEGATIVE Final    Comment:        The Xpert SA Assay (FDA approved for NASAL specimens in patients over 21 years of  age), is one component of a comprehensive surveillance program.  Test performance has been validated by [facility] for patients greater than or equal to 44 year old. It is not intended to diagnose infection nor to guide or monitor treatment.   Wound culture     Status: None (Preliminary result)   Collection Time: 10/20/14  9:00 AM  Result Value Ref Range Status   Specimen Description WOUND GROIN LEFT  Final   Special Requests PT ON VANCOMYCIN,CIPRO SPEC A ON SWAB  Final   Gram Stain   Final    NO WBC SEEN NO SQUAMOUS EPITHELIAL CELLS SEEN MODERATE GRAM POSITIVE COCCI IN PAIRS MODERATE GRAM NEGATIVE RODS Performed at [facility]    Culture   Final    NO GROWTH 1 DAY Performed at [facility]    Report Status PENDING  Incomplete  Tissue culture     Status: None (Preliminary result)   Collection Time: 10/20/14  9:21 AM  Result Value Ref Range Status   Specimen Description TISSUE LEFT GROIN  Final   Special Requests SPECIMEN B IN CUP  Final   Gram Stain   Final    RARE WBC PRESENT, PREDOMINANTLY MONONUCLEAR FEW GRAM NEGATIVE RODS Performed at [facility]  Final    Culture reincubated for better growth Performed at [facility]    Report Status PENDING  Incomplete     CBG (last 3)  No results for input(s): GLUCAP in the last 72 hours.  Meds Scheduled Meds: . amiodarone  400 mg Oral BID  . amLODipine  2.5 mg Oral Daily  . aspirin EC  81 mg Oral Daily  . ciprofloxacin  500 mg Oral BID  . cyclobenzaprine  5 mg Oral QHS  . docusate sodium  100 mg Oral Daily  . ferrous sulfate  325 mg Oral Q breakfast  . furosemide  40 mg Oral BID  . levothyroxine  150 mcg Oral QAC breakfast  . metoprolol succinate  50 mg Oral Q breakfast  . nystatin   Topical BID  . pantoprazole  40 mg Oral Daily  . potassium chloride  10 mEq Oral Q lunch  . sodium chloride  3 mL Intravenous Q12H  . vancomycin  750 mg Intravenous Q12H  . warfarin   2.5 mg Oral q1800  . Warfarin - Physician Dosing Inpatient   Does not apply q1800   Continuous Infusions:  PRN Meds:.acetaminophen **OR** acetaminophen, alum & mag hydroxide-simeth, bisacodyl, fentaNYL (SUBLIMAZE) injection, ondansetron **OR** ondansetron (ZOFRAN) IV, oxyCODONE, oxyCODONE-acetaminophen  Xrays No results found.  Assessment/Plan: S/P Procedure(s) (LRB): WOUND EXPLORATION (Left) APPLICATION OF WOUND VAC (Left)  1 doing well clinically, no fevers 2 no new micro data 3 Vac to be changed today 4 conts current ABX 5 cont coumadin, if INR doesn't increase soon will need higher dose of coumadin  LOS: 4 days    GOLD,[name] 10/22/2014  I have seen and examined the patient and agree with the assessment and plan as outlined.  Wound examined at bedside during wound VAC change.  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  Will re-examine on Monday morning.  If the wound remains stable will plan to d/c home at that time with home wound VAC.  Will give coumadin 5 mg today but INR was supra-therapeutic prior to admission on 2.5 mg/day every M-T-Th-Sa-Su and 5 mg every Wednesday and Friday.  In addition, patient is now also on Ciprofloxacin, although only for a few more days - will not plan to continue antibiotics at the time of discharge  I spent in excess of 15 minutes during the conduct of this hospital encounter and >50% of this time involved direct face-to-face encounter with the patient for counseling and/or coordination of their care.  [name] 10/22/2014 1:30 PM

## 2014-10-22 NOTE — Care Management Note (Signed)
Case Management Note Donn Pierini RN, BSN Unit 2W-Case Manager [phone]  Patient Details  Name: Jamie Howell MRN: [id number] Date of Birth: 05-18-1950  Subjective/Objective:     Pt admitted with wound infection- Left Groin Wound- completely Dehisced- to OR today for exploration/debridement and placement of wound VAC               Action/Plan: PTA pt lived at home - anticipate pt will need HH services -HH-RN for wound VAC- have placed wound VAC form on shadow chart for MD signature- will also need wound measurements and orders for home wound VAC and HH-RN. NCM to follow for d/c needs  Expected Discharge Date:       10/25/14           Expected Discharge Plan:  Home with home health services  In-House Referral:     Discharge planning Services  CM Consult  Post Acute Care Choice:  Home Health Choice offered to:  Patient  DME Arranged:    DME Agency:     HH Arranged:  RN HH Agency:  [facility]  Status of Service:  In process, will continue to follow  Medicare Important Message Given:    Date Medicare IM Given:    Medicare IM give by:    Date Additional Medicare IM Given:    Additional Medicare Important Message give by:     If discussed at Long Length of Stay Meetings, dates discussed:    Additional Comments:  10/22/14- Wound VAC form signed- have spoken with Jermaine with [facility] regarding wound VAC need and referral placed for Wound VAC - plan for Monday- and pt will need wound VAC delivered to hospital for d/c- per Vaughan Basta will make arrangements for wound VAC to be applied prior to discharge by [facility]. Have also spoken with pt and offered choice for [facility] services- per pt she states that she would like [facility]- She will need orders for HH-RN will instructions for wound VAC drsg changes- referral called to [facility] regarding HH needs- [facility] aware of potential d/c for Monday and need for same day RN visit to apply wound VAC)- CM will f/u on Monday for d/c needs and finalize  everything.   Darrold Span, RN 10/22/2014, 11:56 AM

## 2014-10-22 NOTE — Consult Note (Signed)
WOC wound consult note Reason for Consult: first post operative VAC dressing change left groin, S/P wound exploration on 10/20/14 Wound type: surgical  Measurement:4.0cm x 5.0cm x 2.0cm Wound bed: beefy red, subcutaneous tissue, clean Drainage (amount, consistency, odor) moderate, serosanguinous in canister. Some oozing with dressing change  Periwound:intact, wound deep in skin folds of groin and under pannus Dressing procedure/placement/frequency: 1pc of black foam used to fill the wound bed, drape and sealed at .   Noted patient with possible DC to home with [facility] NPWT, they utilize a different dressing and pump than inpatient, will need to DC home with normal saline dressing and have HHRN place NPWT once at home.  Dr. Barry Dienes plans to assess wound on Monday, I will advise him and beside nursing staff that this will need to occur.  Discussed POC with patient and bedside nurse.  Re consult if needed, will not follow at this time. Thanks  Neddie Steedman [employer], CWOCN [phone])

## 2014-10-23 LAB — PROTIME-INR
INR: 1.75 — ABNORMAL HIGH (ref 0.00–1.49)
PROTHROMBIN TIME: 20.4 s — AB (ref 11.6–15.2)

## 2014-10-23 LAB — TSH: TSH: 6.445 u[IU]/mL — ABNORMAL HIGH (ref 0.350–4.500)

## 2014-10-23 NOTE — Progress Notes (Signed)
CARDIAC REHAB PHASE I   PRE:  Rate/Rhythm: 75  BP:  Sitting: 100/60     SaO2: 100 ra  MODE:  Ambulation: 600 ft   POST:  Rate/Rhythm: 75  BP:  Sitting: 114/64     SaO2: could not get- fingers too cold  10:35am-11:00am Patient ambulated at a good steady pace independently without any assisted devices. Patient complained of very minor knee pain which she states was caused from sleeping wrong. Patient left sitting on bed with son at bedside.   Oona Trammel, [place], [place] 10/23/2014 10:56 AM

## 2014-10-23 NOTE — Progress Notes (Addendum)
      [address]       [place] [address]             [phone]      3 Days Post-Op Procedure(s) (LRB): WOUND EXPLORATION (Left) APPLICATION OF WOUND VAC (Left) Subjective: Feels ok, leg is not painfull  Objective: Vital signs in last 24 hours: Temp:  [97.8 F (36.6 C)-98.6 F (37 C)] 98.6 F (37 C) (07/23 0447) Pulse Rate:  [77-81] 77 (07/23 0447) Cardiac Rhythm:  [-]  Resp:  [18] 18 (07/23 0447) BP: (108-132)/(66-89) 108/66 mmHg (07/23 0447) SpO2:  [96 %-99 %] 99 % (07/23 0447) Weight:  [189 lb 6 oz (85.9 kg)] 189 lb 6 oz (85.9 kg) (07/23 0447)  Hemodynamic parameters for last 24 hours:    Intake/Output from previous day: 07/22 0701 - 07/23 0700 In: 720 [P.O.:720] Out: 800 [Urine:800] Intake/Output this shift:    General appearance: alert, cooperative and no distress Heart: irregularly irregular rhythm Lungs: clear to auscultation bilaterally Extremities: varicose veins noted Wound: stable appearance with vac in place  Lab Results:  Recent Labs  10/21/14 0318  WBC 3.7*  HGB 10.1*  HCT 31.6*  PLT 48*   BMET:  Recent Labs  10/21/14 0318  NA 134*  K 4.5  CL 101  CO2 24  GLUCOSE 114*  BUN 19  CREATININE 1.17*  CALCIUM 8.4*    PT/INR:  Recent Labs  10/23/14 0424  LABPROT 20.4*  INR 1.75*   ABG    Component Value Date/Time   PHART 7.374 09/28/2014 1132   HCO3 23.3 09/28/2014 1132   TCO2 24 09/28/2014 1132   ACIDBASEDEF 2.0 09/28/2014 1132   O2SAT 93.0 09/28/2014 1132   CBG (last 3)  No results for input(s): GLUCAP in the last 72 hours.  Meds Scheduled Meds: . amiodarone  400 mg Oral BID  . amLODipine  2.5 mg Oral Daily  . aspirin EC  81 mg Oral Daily  . ciprofloxacin  500 mg Oral BID  . cyclobenzaprine  5 mg Oral QHS  . docusate sodium  100 mg Oral Daily  . ferrous sulfate  325 mg Oral Q breakfast  . furosemide  40 mg Oral BID  . levothyroxine  150 mcg Oral QAC breakfast  . metoprolol succinate  50 mg  Oral Q breakfast  . nystatin   Topical BID  . pantoprazole  40 mg Oral Daily  . potassium chloride  10 mEq Oral Q lunch  . sodium chloride  3 mL Intravenous Q12H  . vancomycin  750 mg Intravenous Q12H  . warfarin  2.5 mg Oral q1800  . Warfarin - Physician Dosing Inpatient   Does not apply q1800   Continuous Infusions:  PRN Meds:.acetaminophen **OR** acetaminophen, alum & mag hydroxide-simeth, bisacodyl, fentaNYL (SUBLIMAZE) injection, ondansetron **OR** ondansetron (ZOFRAN) IV, oxyCODONE, oxyCODONE-acetaminophen  Xrays No results found.  Assessment/Plan: S/P Procedure(s) (LRB): WOUND EXPLORATION (Left) APPLICATION OF WOUND VAC (Left)  1 INR trending higher- cont coumadin as outlined per Dr Cornelius Moras 2 cont current abx/wound management 3 on amiodarone for afib. I do not see recent TSH( she is on synthroid) - will check 4 recheck CBC in am to re-eval platelets and WBC   LOS: 5 days    GOLD,[name] 10/23/2014  Agree with above. Plan home Monday with wound vac.

## 2014-10-24 LAB — CBC
HEMATOCRIT: 33.5 % — AB (ref 36.0–46.0)
HEMOGLOBIN: 10.5 g/dL — AB (ref 12.0–15.0)
MCH: 28.8 pg (ref 26.0–34.0)
MCHC: 31.3 g/dL (ref 30.0–36.0)
MCV: 92 fL (ref 78.0–100.0)
Platelets: 48 10*3/uL — ABNORMAL LOW (ref 150–400)
RBC: 3.64 MIL/uL — AB (ref 3.87–5.11)
RDW: 21.7 % — ABNORMAL HIGH (ref 11.5–15.5)
WBC: 3.6 10*3/uL — AB (ref 4.0–10.5)

## 2014-10-24 LAB — WOUND CULTURE: GRAM STAIN: NONE SEEN

## 2014-10-24 LAB — TISSUE CULTURE

## 2014-10-24 LAB — PROTIME-INR
INR: 2.16 — ABNORMAL HIGH (ref 0.00–1.49)
Prothrombin Time: 23.9 seconds — ABNORMAL HIGH (ref 11.6–15.2)

## 2014-10-24 MED ORDER — LEVOTHYROXINE SODIUM 175 MCG PO TABS
175.0000 ug | ORAL_TABLET | Freq: Every day | ORAL | Status: DC
  Administered 2014-10-25: 175 ug via ORAL
  Filled 2014-10-24 (×2): qty 1

## 2014-10-24 NOTE — Discharge Summary (Signed)
Physician Discharge Summary  Patient ID: Jamie Howell MRN: [id number] DOB/AGE: [birth year] 64 y.o.  Admit date: 10/18/2014 Discharge date: 10/25/2014  Admission Diagnoses: left groin wound infection following TAVR  Discharge Diagnoses:  Active Problems:   Wound infection after surgery   Wound, surgical, infected  Patient Active Problem List   Diagnosis Date Noted  . Wound, surgical, infected 10/20/2014  . Wound infection after surgery 10/18/2014  . S/P TAVR (transcatheter aortic valve replacement) 09/28/2014  . Severe aortic valve stenosis 08/06/2014  . S/P tooth extraction 08/05/2014  . H/O mitral valve replacement with mechanical valve   . Thrombocytopenia 07/22/2014  . Splenomegaly 07/14/2014  . Portal hypertension with esophageal varices 07/14/2014  . Obesity (BMI 30-39.9) 07/08/2014  . Severe aortic stenosis 06/28/2014  . SOB (shortness of breath) 05/07/2014  . Moderate aortic insufficiency 01/19/2013  . Chronic diastolic CHF (congestive heart failure) 01/19/2013  . Pulmonary HTN 01/19/2013  . 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 OA (ocular albinism)   . HTN (hypertension)   . Thyrotoxicosis   . Hypothyroidism   . Mitral valve disorder   . Chronic recurrent paroxysmal atrial fibrillation   . S/P mitral valve replacement with metallic valve 04/08/1991    History of Present Illness: At time of admission This is a 64 year old Caucasian female who presented to the office today for a 2 week follow up appointment. She underwent TAVR via left femoral approach for severe aortic stenosis by Dr. Cornelius Moras and Dr. Excell Seltzer on 09/28/2014. She states that on this past Friday, she noticed her left groin wound had some drainage and "began to open up". She denies any fever, chills, shortness of breath, or chest pain. I inquired when her last PT and INR were drawn, as she is on Coumadin for a [employer] mechanical valve. She stated she has not had her INR checked since she left the  hospital on 10/02/2014. On physical exam, her left groin wound had sloughy tissue and was dehisced. She also appears to have yeast under panus and on inner upper thighs. I obtained a wound culture of the left groin. She will be admitted to [facility] today for IV antibiotics. She will also undergo I+D/VAC placement.    Discharged Condition: good  Hospital Course: The patient was admitted and did undergo the following procedure on 10/20/2014: Cornelius Moras, MD Physician Signed Cardiothoracic Surgery Op Note 10/20/2014 9:38 AM    Expand All Collapse All   CARDIOTHORACIC SURGERY OPERATIVE NOTE  Date of Procedure:10/20/2014  Preoperative Diagnosis:Left Groin Wound Infection  Postoperative Diagnosis:same  Procedure:Excisional Debridement Left Groin Wound and Placement of Wound Vacuum Assist Closure Device  Surgeon:Clarence H. Cornelius Moras, MD  Assistant:Diana Domenica Fail, CRNFA  Anesthesia:Intravenous Sedation with Monitored Anesthesia      She tolerated the procedure well was taken to the postanesthesia care unit in stable condition. Postoperatively she has progressed nicely. The wound has been observed closely. She is been placed on and about his initially vancomycin and Cipro. She's been continued on oral Cipro. She is also been restarted on Coumadin which was initially held due to hyper coagulability. INR today is 2.23. She is tolerating routine activity with ambulation in the hallways. She is medically felt to be quite stable. She is felt stable for discharge today.  Consults: None  Significant Diagnostic Studies: microbiology: wound culture: positive for e coli  Treatments: antibiotics: vancomycin and Cipro  Discharge Exam: Blood pressure 100/64, pulse 56, temperature  97.7 F (36.5 C), temperature source Oral, resp.  rate 15, height 4\' 11"  (1.499 m), weight 190 lb 9.6 oz (86.456 kg), SpO2 97 %.   General appearance: alert, cooperative and no distress Heart: regular rate and rhythm Lungs: clear to auscultation bilaterally Extremities: varicose veins noted Wound: vac in place , some odor which mau be yeast, minor erethema    Disposition: 01-Home or Self Care  HHN arranged for assist with VAC. Left groin wound VAC to be changed every Monday, Wednesday, and Friday. We ask that a PT and INR also be drawn on Thursday 10/28/2014 as patient on Coumadin for a mechanical valve.  Medications at discharge:   Medication List    TAKE these medications        amiodarone 200 MG tablet  Commonly known as:  PACERONE  Take 1 tablet (200 mg total) by mouth daily.     amLODipine 2.5 MG tablet  Commonly known as:  NORVASC  Take 2.5 mg by mouth daily.     aspirin EC 81 MG tablet  Take 1 tablet (81 mg total) by mouth daily.     CALCIUM 1000 + D PO  Take 1,000 mg by mouth daily with lunch.     cyclobenzaprine 10 MG tablet  Commonly known as:  FLEXERIL  Take 5 mg by mouth at bedtime.     ferrous sulfate 325 (65 FE) MG tablet  Take 1 tablet (325 mg total) by mouth daily with breakfast.     furosemide 40 MG tablet  Commonly known as:  LASIX  Take 1 tablet (40 mg total) by mouth 2 (two) times daily.     levothyroxine 175 MCG tablet  Commonly known as:  SYNTHROID, LEVOTHROID  Take 1 tablet (175 mcg total) by mouth daily before breakfast.     metoprolol succinate 50 MG 24 hr tablet  Commonly known as:  TOPROL-XL  TAKE ONE TABLET BY MOUTH ONCE DAILY WITH A MEAL OR IMMEDIATELY FOLLOWING A MEAL.     nystatin 100000 UNIT/GM Powd  Apply to affected areas daily     oxyCODONE-acetaminophen 5-325 MG per tablet  Commonly known as:  PERCOCET/ROXICET  Take 0.5 tablets by mouth at bedtime as needed (pain).     pantoprazole 40 MG tablet  Commonly known as:   PROTONIX  Take 1 tablet (40 mg total) by mouth daily.     potassium chloride 10 MEQ tablet  Commonly known as:  K-DUR  Take 10 mEq by mouth daily with lunch.     warfarin 5 MG tablet  Commonly known as:  COUMADIN  Take 0.5 tablets (2.5 mg total) by mouth at bedtime. Or as directed.          Follow-up Information    Follow up with [facility].   Why:  Please change wound VAC every Monday, Wednesday, and Friday. Also, please draw PT and INR on Thursday 10/28/2014 and call or fax results to Dr. Earmon Phoenix office   Contact information:   [address] [place] [address] [phone]       Follow up with [name], MD On 11/15/2014.   Specialty:  Cardiothoracic Surgery   Why:  Appointment time is at 11:00 am   Contact information:   [address] [place] [address] [phone]       Signed: Ardelle Balls PA-C 10/25/2014, 12:46 PM

## 2014-10-24 NOTE — Progress Notes (Addendum)
      [address]       [place] [address]             [phone]      4 Days Post-Op Procedure(s) (LRB): WOUND EXPLORATION (Left) APPLICATION OF WOUND VAC (Left) Subjective: "going a little stir crazy", feels ok  Objective: Vital signs in last 24 hours: Temp:  [97.7 F (36.5 C)-98 F (36.7 C)] 98 F (36.7 C) (07/24 0419) Pulse Rate:  [58-75] 75 (07/24 0835) Cardiac Rhythm:  [-]  Resp:  [16-18] 18 (07/24 0419) BP: (97-130)/(55-72) 130/71 mmHg (07/24 0835) SpO2:  [95 %-100 %] 95 % (07/24 0419) Weight:  [189 lb 8 oz (85.957 kg)] 189 lb 8 oz (85.957 kg) (07/24 0419)  Hemodynamic parameters for last 24 hours:    Intake/Output from previous day: 07/23 0701 - 07/24 0700 In: 240 [P.O.:240] Out: 700 [Urine:600; Drains:100] Intake/Output this shift:    General appearance: alert, cooperative and no distress Extremities: varicose veins noted Wound: vac in place , serous drainage, no surrounding erethema  Lab Results:  Recent Labs  10/24/14 0325  WBC 3.6*  HGB 10.5*  HCT 33.5*  PLT 48*   BMET: No results for input(s): NA, K, CL, CO2, GLUCOSE, BUN, CREATININE, CALCIUM in the last 72 hours.  PT/INR:  Recent Labs  10/24/14 0325  LABPROT 23.9*  INR 2.16*   ABG    Component Value Date/Time   PHART 7.374 09/28/2014 1132   HCO3 23.3 09/28/2014 1132   TCO2 24 09/28/2014 1132   ACIDBASEDEF 2.0 09/28/2014 1132   O2SAT 93.0 09/28/2014 1132   CBG (last 3)  No results for input(s): GLUCAP in the last 72 hours.  Meds Scheduled Meds: . amiodarone  400 mg Oral BID  . amLODipine  2.5 mg Oral Daily  . aspirin EC  81 mg Oral Daily  . ciprofloxacin  500 mg Oral BID  . cyclobenzaprine  5 mg Oral QHS  . docusate sodium  100 mg Oral Daily  . ferrous sulfate  325 mg Oral Q breakfast  . furosemide  40 mg Oral BID  . levothyroxine  150 mcg Oral QAC breakfast  . metoprolol succinate  50 mg Oral Q breakfast  . nystatin   Topical BID  . pantoprazole  40  mg Oral Daily  . potassium chloride  10 mEq Oral Q lunch  . sodium chloride  3 mL Intravenous Q12H  . vancomycin  750 mg Intravenous Q12H  . warfarin  2.5 mg Oral q1800  . Warfarin - Physician Dosing Inpatient   Does not apply q1800   Continuous Infusions:  PRN Meds:.acetaminophen **OR** acetaminophen, alum & mag hydroxide-simeth, bisacodyl, fentaNYL (SUBLIMAZE) injection, ondansetron **OR** ondansetron (ZOFRAN) IV, oxyCODONE, oxyCODONE-acetaminophen  Xrays No results found.  Assessment/Plan: S/P Procedure(s) (LRB): WOUND EXPLORATION (Left) APPLICATION OF WOUND VAC (Left)  1 stable on current rx 2 TSH is elevated- will increase synthroid a little 3 maint SR 4 cont coumadin 5 hopefully home in am   LOS: 6 days    GOLD,[name] 10/24/2014   Chart reviewed, patient examined, agree with above.

## 2014-10-24 NOTE — Discharge Instructions (Signed)
Vacuum-Assisted Closure Therapy Home Guide  Vacuum-assisted closure therapy (VAC therapy) is a device that helps wounds heal. It is used on wounds that cannot be closed with stitches. They often heal slowly. VAC therapy helps the wound stay clean and healthy while its edges slowly grow back together.  VAC therapy uses a bandage (dressing) that is made of foam. It is put inside the wound. Then, a drape is placed over the wound. This drape sticks to your skin (adhesive) to keep air out. A tube is hooked up to a small pump and is attached to the drape. The pump sucks fluid and germs from the wound. It can also decrease any bad smell that comes from the wound.  RISKS AND COMPLICATIONS  VAC therapy is usually safe to use at home. Your skin may get sore from the adhesive drape. That is the most common problem. However, more serious problems can develop, such as:   · Bleeding. This can happen if the dressing in the wound comes into contact with blood vessels. A little bleeding may occur when the dressing is being changed. This is normal now and then. Major bleeding can happen if a large blood vessel breaks. This is more likely if you are taking blood-thinning medicine. Emergency surgery may be needed.  · Infection. This can happen if the dressing has an air leak that is not repaired within a couple of hours.  · Dehydration. This can happen if the pump sucks out too much body fluid.  DRESSING CHANGES  Your dressing will have to be changed. Sometimes this is needed once a day. Other times, a dressing change must be done 3 times a week. How often you change your dressing will depend on what your wound is like. A trained caregiver will most likely change the dressing. However, a family member or friend may be trained to change the dressing. Below are steps to change a dressing in order to prevent an infection. The steps apply to you or the person that changes your dressing.  · Wash your hands with soap and water before and  after each dressing change.  · Wear gloves and protective clothing. This may include eye protection.  · Do not allow anyone to change your dressing if they have an infection or a skin condition. Even a small cut can be a problem.  To change the dressing:   · Turn off the pump.  · Take off the adhesive drape.  · Disconnect the tube from the dressing.  · Take out the dressing that is inside the wound. If the dressing sticks, use a germ-free (sterile), saltwater solution to wet the dressing. This helps it come out more easily. If it hurts when the dressing is changed, take pain medicine 30 minutes before the dressing change.  · Cleanse the wound with normal saline or sterile water.  · Apply a skin barrier film to the skin that will be covered with the drape. This will protect the skin.  · Put a new dressing into the wound.  · Apply a new drape and tube.  · Replace the container in the pump that collects fluid if it is full. Do this at least once per week.  · Turn the pump back on.  · Your doctor will decide what setting of suction is best. Do not change the settings on the machine without talking to your nurse or doctor.  HOME CARE INSTRUCTIONS   · The VAC pump has an alarm. It goes   off if there are any problems such as a leak.  ¨ Ask your caregiver what to do if the alarm goes off.  ¨ Call your caregiver right away if the alarm goes off and you cannot fix the problem.  · Do not turn off the pump for more than 2 hours.  · Check your wound carefully at each dressing change for signs of infection. Watch for redness, swelling, or any fluid leaking from the wound. If you develop an infection:  ¨ You may have to stop VAC therapy.  ¨ The wound will need to be cleaned and washed out.  ¨ You will have to take antibiotic medicine.  · Ask your caregiver what activities you should or should not do while you are getting VAC therapy. This will depend on your particular wound.  · Ask if it is okay to turn off the pump so you can  take a shower. If it is okay, make sure the wound is covered with plastic. The wound area must stay dry.  · Drink enough fluids to keep your urine clear or pale yellow.  · Eat foods that contain a lot of protein. Examples are meat, poultry, seafood, eggs, nuts, beans, and peas. Protein can help your wound heal.  SEEK MEDICAL CARE IF:  · Your wound itches or hurts.  · Dressing changes are often painful or bleeding often occurs.  · You have a headache.  · You have diarrhea.  · You have a sore throat.  · You have a rash.  · You feel nauseous.  · You feel dizzy or weak.  SEEK IMMEDIATE MEDICAL CARE IF:   · You have very bad pain.  · You have bleeding that will not stop.  · Your wound smells bad.  · You have redness, swelling, or fluid leaking from your wound.  · Your alarm goes off and you do not know what to do.  · You have a fever.  Document Released: 06/11/2011 Document Reviewed: 06/11/2011  ExitCare® Patient Information ©2015 [employer]. This information is not intended to replace advice given to you by your health care provider. Make sure you discuss any questions you have with your health care provider.

## 2014-10-25 LAB — PROTIME-INR
INR: 2.23 — AB (ref 0.00–1.49)
Prothrombin Time: 24.5 seconds — ABNORMAL HIGH (ref 11.6–15.2)

## 2014-10-25 MED ORDER — LEVOTHYROXINE SODIUM 175 MCG PO TABS: 175.0000 ug | ORAL_TABLET | Freq: Every day | ORAL | Status: DC

## 2014-10-25 MED ORDER — WARFARIN SODIUM 5 MG PO TABS: 2.5000 mg | ORAL_TABLET | Freq: Every day | ORAL | Status: DC

## 2014-10-25 MED ORDER — AMIODARONE HCL 200 MG PO TABS: 200.0000 mg | ORAL_TABLET | Freq: Every day | ORAL | Status: DC

## 2014-10-25 MED ORDER — OXYCODONE-ACETAMINOPHEN 5-325 MG PO TABS: 0.5000 | ORAL_TABLET | Freq: Every evening | ORAL | Status: DC | PRN

## 2014-10-25 MED ORDER — NYSTATIN 100000 UNIT/GM EX POWD: CUTANEOUS | Status: DC

## 2014-10-25 MED ORDER — AMIODARONE HCL 200 MG PO TABS: 200.0000 mg | ORAL_TABLET | Freq: Two times a day (BID) | ORAL | Status: DC

## 2014-10-25 NOTE — Care Management Note (Addendum)
Case Management Note  Patient Details  Name: Jamie Howell MRN: [id number] Date of Birth: 03-Apr-1950  Subjective/Objective:  Verified with patient plan for discharge today.  Son lives with her and assists with her care.  Verified that she understands that a [facility] RN with [facility] will be coming out to see her and assist with VAC.  No further questions from patient or son.    STILL NEED order for [facility] for discharge.                 Action/Plan:   Expected Discharge Date:                  Expected Discharge Plan:  Home w Home Health Services  In-House Referral:     Discharge planning Services  CM Consult  Post Acute Care Choice:  Home Health Choice offered to:  Patient  DME Arranged:    DME Agency:     HH Arranged:  RN HH Agency:  [facility]  Status of Service:  Completed, signed off  Medicare Important Message Given:    Date Medicare IM Given:    Medicare IM give by:    Date Additional Medicare IM Given:    Additional Medicare Important Message give by:     If discussed at Long Length of Stay Meetings, dates discussed:    Additional Comments:  Vangie Bicker, RN 10/25/2014, 11:48 AM

## 2014-10-25 NOTE — Consult Note (Signed)
WOC wound follow up Wound type: surgical   Wound bed: beefy red, early granulation tissue Drainage (amount, consistency, odor) moderate, serosanguineous in the VAC canister Periwound: intact, some bruising noted just to the right of the wound today Dressing procedure/placement/frequency: 1pc of black foam cut to fit and placed to fill the wound bed, seal at to [facility] Medela pump.  Other home supplies in the room, instructed patient to make sure to take these supplies home with her.  Dr. Cornelius Moras oked her to shower, I suggested she coordinate showers with [facility] when scheduled dressing changes due.  Demonstrated how to plug Medela machine up to wall for power and to leave plugged up unless up in the halls.    Dr. Cornelius Moras at bedside to assess wound. Plans for DC to home today, notified CM that I have placed home dressing and machine for DC.  Catlyn Shipton Delanson RN,CWOCN [phone]

## 2014-10-25 NOTE — Progress Notes (Addendum)
      [address]       [place] [address]             [phone]      5 Days Post-Op Procedure(s) (LRB): WOUND EXPLORATION (Left) APPLICATION OF WOUND VAC (Left) Subjective: Feels ok, no new complaints  Objective: Vital signs in last 24 hours: Temp:  [97.7 F (36.5 C)-98.4 F (36.9 C)] 97.7 F (36.5 C) (07/25 0416) Pulse Rate:  [56-75] 56 (07/25 0416) Cardiac Rhythm:  [-] Atrial fibrillation (07/25 0415) Resp:  [15-17] 15 (07/25 0416) BP: (100-130)/(64-73) 100/64 mmHg (07/25 0416) SpO2:  [97 %-100 %] 97 % (07/25 0416) Weight:  [190 lb 9.6 oz (86.456 kg)] 190 lb 9.6 oz (86.456 kg) (07/25 0416)  Hemodynamic parameters for last 24 hours:    Intake/Output from previous day: 07/24 0701 - 07/25 0700 In: 480 [P.O.:480] Out: 1150 [Urine:1000; Drains:150] Intake/Output this shift:    General appearance: alert, cooperative and no distress Heart: regular rate and rhythm Lungs: clear to auscultation bilaterally Extremities: varicose veins noted Wound: vac in place , some odor which mau be yeast, minor erethema  Lab Results:  Recent Labs  10/24/14 0325  WBC 3.6*  HGB 10.5*  HCT 33.5*  PLT 48*   BMET: No results for input(s): NA, K, CL, CO2, GLUCOSE, BUN, CREATININE, CALCIUM in the last 72 hours.  PT/INR:  Recent Labs  10/25/14 0420  LABPROT 24.5*  INR 2.23*   ABG    Component Value Date/Time   PHART 7.374 09/28/2014 1132   HCO3 23.3 09/28/2014 1132   TCO2 24 09/28/2014 1132   ACIDBASEDEF 2.0 09/28/2014 1132   O2SAT 93.0 09/28/2014 1132   CBG (last 3)  No results for input(s): GLUCAP in the last 72 hours.  Meds Scheduled Meds: . amiodarone  400 mg Oral BID  . amLODipine  2.5 mg Oral Daily  . aspirin EC  81 mg Oral Daily  . ciprofloxacin  500 mg Oral BID  . cyclobenzaprine  5 mg Oral QHS  . docusate sodium  100 mg Oral Daily  . ferrous sulfate  325 mg Oral Q breakfast  . furosemide  40 mg Oral BID  . levothyroxine  175 mcg Oral  QAC breakfast  . metoprolol succinate  50 mg Oral Q breakfast  . nystatin   Topical BID  . pantoprazole  40 mg Oral Daily  . potassium chloride  10 mEq Oral Q lunch  . sodium chloride  3 mL Intravenous Q12H  . warfarin  2.5 mg Oral q1800  . Warfarin - Physician Dosing Inpatient   Does not apply q1800   Continuous Infusions:  PRN Meds:.acetaminophen **OR** acetaminophen, alum & mag hydroxide-simeth, bisacodyl, fentaNYL (SUBLIMAZE) injection, ondansetron **OR** ondansetron (ZOFRAN) IV, oxyCODONE, oxyCODONE-acetaminophen  Xrays No results found.  Assessment/Plan: S/P Procedure(s) (LRB): WOUND EXPLORATION (Left) APPLICATION OF WOUND VAC (Left)  1 vac to be changed today and if looks ok, poss discharge   LOS: 7 days    Jamie Howell,Jamie Howell 10/25/2014  I have seen and examined the patient and agree with the assessment and plan as outlined.  Wound examined at bedside - looks very good.  D/C home today with home wound VAC to be changed every M-W-F.  Follow up in office in 3 weeks.  Continue coumadin 2.5 mg/day and check INR on Thursday  Jamie Howell 10/25/2014 8:59 AM

## 2014-10-27 DIAGNOSIS — Z48812 Encounter for surgical aftercare following surgery on the circulatory system: Secondary | ICD-10-CM | POA: Diagnosis not present

## 2014-10-28 ENCOUNTER — Telehealth: Payer: Self-pay | Admitting: Cardiovascular Disease

## 2014-10-28 NOTE — Telephone Encounter (Deleted)
ERROR

## 2014-11-15 ENCOUNTER — Encounter: Payer: Self-pay | Admitting: Thoracic Surgery (Cardiothoracic Vascular Surgery)

## 2014-11-15 ENCOUNTER — Ambulatory Visit (INDEPENDENT_AMBULATORY_CARE_PROVIDER_SITE_OTHER): Payer: 59 | Admitting: Thoracic Surgery (Cardiothoracic Vascular Surgery)

## 2014-11-15 VITALS — BP 164/84 | HR 84 | Resp 16 | Ht 59.0 in | Wt 190.0 lb

## 2014-11-15 DIAGNOSIS — Z952 Presence of prosthetic heart valve: Secondary | ICD-10-CM

## 2014-11-15 DIAGNOSIS — T8131XD Disruption of external operation (surgical) wound, not elsewhere classified, subsequent encounter: Secondary | ICD-10-CM

## 2014-11-15 DIAGNOSIS — IMO0001 Reserved for inherently not codable concepts without codable children: Secondary | ICD-10-CM

## 2014-11-15 DIAGNOSIS — Z954 Presence of other heart-valve replacement: Secondary | ICD-10-CM | POA: Diagnosis not present

## 2014-11-15 DIAGNOSIS — T814XXD Infection following a procedure, subsequent encounter: Secondary | ICD-10-CM

## 2014-11-15 DIAGNOSIS — Z953 Presence of xenogenic heart valve: Secondary | ICD-10-CM

## 2014-11-15 NOTE — Progress Notes (Signed)
[address]       [name] [address]             [phone]     CARDIOTHORACIC SURGERY OFFICE NOTE  Referring Provider is Tonny Bollman, MD PCP is Londell Moh, MD   HPI:  Patient returns to the office today for wound check status post wound exploration and debridement for left groin wound infection status post transcatheter aortic valve replacement the open left transfemoral approach on 09/28/2014.  She has done very well for the last 2 weeks with home health wound care changing her VAC 3 times a week. She has had some problems with Coumadin dosing over the last couple weeks, but otherwise the patient states that she feels quite well. She denies any problems with shortness of breath and states that her breathing already feels better than it did prior to surgery.   Current Outpatient Prescriptions  Medication Sig Dispense Refill  . amiodarone (PACERONE) 200 MG tablet Take 1 tablet (200 mg total) by mouth daily. 30 tablet 1  . amLODipine (NORVASC) 2.5 MG tablet Take 2.5 mg by mouth daily.     Marland Kitchen aspirin EC 81 MG tablet Take 1 tablet (81 mg total) by mouth daily. (Patient taking differently: Take 81 mg by mouth daily with lunch. )    . Calcium Carb-Cholecalciferol (CALCIUM 1000 + D PO) Take 1,000 mg by mouth daily with lunch.     . cyclobenzaprine (FLEXERIL) 10 MG tablet Take 5 mg by mouth at bedtime.     . ferrous sulfate 325 (65 FE) MG tablet Take 1 tablet (325 mg total) by mouth daily with breakfast. 30 tablet 5  . furosemide (LASIX) 40 MG tablet Take 1 tablet (40 mg total) by mouth 2 (two) times daily. (Patient taking differently: Take 40 mg by mouth 2 (two) times daily. Morning and lunch time)    . levothyroxine (SYNTHROID, LEVOTHROID) 175 MCG tablet Take 1 tablet (175 mcg total) by mouth daily before breakfast. 30 tablet 1  . metoprolol succinate (TOPROL-XL) 50 MG 24 hr tablet TAKE ONE TABLET BY MOUTH ONCE DAILY WITH A MEAL OR IMMEDIATELY FOLLOWING  A MEAL. (Patient taking differently: TAKE ONE TABLET BY MOUTH ONCE DAILY AT BEDTIME) 30 tablet 3  . nystatin (MYCOSTATIN/NYSTOP) 100000 UNIT/GM POWD Apply to affected areas daily 15 g 0  . oxyCODONE-acetaminophen (PERCOCET/ROXICET) 5-325 MG per tablet Take 0.5 tablets by mouth at bedtime as needed (pain). 20 tablet 0  . pantoprazole (PROTONIX) 40 MG tablet Take 1 tablet (40 mg total) by mouth daily. 30 tablet 5  . potassium chloride (K-DUR) 10 MEQ tablet Take 10 mEq by mouth daily with lunch.     . warfarin (COUMADIN) 5 MG tablet Take 0.5 tablets (2.5 mg total) by mouth at bedtime. Or as directed.     No current facility-administered medications for this visit.      Physical Exam:   BP 164/84 mmHg  Pulse 84  Resp 16  Ht  (1.499 m)  Wt 190 lb (86.183 kg)  BMI 38.35 kg/m2  SpO2 96%  General:  Obese but well appearing  Chest:   Clear to auscultation  CV:   Regular rate and rhythm with mechanical heart valve sounds and soft systolic murmur  Incisions:  Left groin wound is granulating in beautifully. The depth of the wound has granulated in very rapidly and now the wound is beginning to contract. There is healthy beefy red granulation tissue throughout.  Abdomen:  Soft and nontender  Extremities:  Warm and well-perfused  Diagnostic Tests:  n/a   Impression:  Excellent progress with open left groin wound. The wound is healing very rapidly and without complication.  Plan:  Continue wound VAC therapy for now. The patient will return for follow-up wound check in 4 weeks. It is possible that during the interim period of time her wound may contract and heal to the point where VAC therapy could be discontinued, but I suspect this will take at least a few weeks.  The patient is scheduled for routine follow-up transthoracic echocardiogram and office visit with Dr. Excell Seltzer later this week. We have not recommended any changes to the patient's current medications at this time.  I spent in  excess of 10 minutes during the conduct of this office consultation and >50% of this time involved direct face-to-face encounter with the patient for counseling and/or coordination of their care.   Salvatore Decent. Cornelius Moras, MD 11/15/2014 11:07 AM

## 2014-11-19 ENCOUNTER — Ambulatory Visit (INDEPENDENT_AMBULATORY_CARE_PROVIDER_SITE_OTHER): Payer: 59 | Admitting: Cardiovascular Disease

## 2014-11-19 ENCOUNTER — Encounter: Payer: Self-pay | Admitting: Cardiovascular Disease

## 2014-11-19 ENCOUNTER — Other Ambulatory Visit: Payer: Self-pay

## 2014-11-19 ENCOUNTER — Ambulatory Visit (HOSPITAL_COMMUNITY): Payer: Commercial Managed Care - HMO | Attending: Cardiology

## 2014-11-19 VITALS — BP 130/90 | HR 85 | Ht 59.0 in | Wt 193.1 lb

## 2014-11-19 DIAGNOSIS — Z952 Presence of prosthetic heart valve: Secondary | ICD-10-CM

## 2014-11-19 DIAGNOSIS — Z954 Presence of other heart-valve replacement: Secondary | ICD-10-CM | POA: Diagnosis not present

## 2014-11-19 DIAGNOSIS — I10 Essential (primary) hypertension: Secondary | ICD-10-CM | POA: Insufficient documentation

## 2014-11-19 DIAGNOSIS — I350 Nonrheumatic aortic (valve) stenosis: Secondary | ICD-10-CM | POA: Diagnosis not present

## 2014-11-19 DIAGNOSIS — I071 Rheumatic tricuspid insufficiency: Secondary | ICD-10-CM | POA: Diagnosis not present

## 2014-11-19 DIAGNOSIS — I517 Cardiomegaly: Secondary | ICD-10-CM | POA: Diagnosis not present

## 2014-11-19 NOTE — Progress Notes (Signed)
Cardiology Office Note   Date:  11/19/2014   ID:  Jamie Howell, DOB 21-Jan-1951, MRN [id number]  PCP:  Jamie Moh, MD  Cardiologist:  Jamie Bollman, MD    Chief Complaint  Patient presents with  . Fatigue     History of Present Illness: Jamie Howell is a 64 y.o. female who presents for follow-up after TAVR 09/28/2014. Her history has been well documented with rheumatic heart disease and previous mechanical mitral valve replacement. She has been on chronic warfarin. Patient has hepatic cirrhosis, chronic thrombocytopenia, and splenomegaly. Her early postoperative course was complicated by a wound infection. She underwent surgical treatment of the left groin and now has a wound VAC in place. She has been followed closely by Dr. Cornelius Howell.  From a cardiac perspective, she is doing well. Her breathing is improved. She has no shortness of breath at present. She denies chest pain or pressure. She does admit to some swelling of the left leg. No other complaints reported.   Past Medical History  Diagnosis Date  . HTN (hypertension)   . Thyrotoxicosis      without mention of goiter or other cause, without mention of thyrotoxic crisis or storm  . Hypothyroidism   . 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 Chronic recurrent paroxysmal atrial fibrillation   . Atrial flutter   . Subdural hematoma   . Pulmonary HTN     mild  . Aortic valve stenosis     moderate aortic valve stenosis with moderate aortic insufficiency, stable MVR, trivial PR, mild TR, severe LAE, grade II-III diastolic dysfunction,mild pulmonary HTN , EF 50% by echo 12/2012  . 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 Chronic recurrent paroxysmal atrial fibrillation   . Obesity (BMI 30-39.9) 07/08/2014  . Splenomegaly 07/14/2014  . Portal hypertension with esophageal varices  07/14/2014  . Heart murmur   . Temporary low platelet count   . 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 CHF (congestive heart failure)     "I think so" (10/18/2014)  . Pneumonia ~ 2014; 05/2014  . Anemia   . History of blood transfusion 09/2014    "during hospitalization"    Past Surgical History  Procedure Laterality Date  . Mitral valve replacement  04/08/1991    31mm St Jude mechanical valve - Dr Jamie Howell  . Left and right heart catheterization with coronary angiogram N/A 06/28/2014    Procedure: LEFT AND RIGHT HEART CATHETERIZATION WITH CORONARY ANGIOGRAM;  Surgeon: Jamie Bollman, MD;  Location: [facility] CATH LAB;  Service: Cardiovascular;  Laterality: N/A;  . Subdural hematoma evacuation via craniotomy  2000    Dr Jamie Howell  . Total abdominal hysterectomy  07-23-1994  . Tonsillectomy    . Multiple extractions with alveoloplasty N/A 08/05/2014    Procedure: Extraction of tooth #'s 3,4,5,7,8,9,10,11,12,13,17,20,21,22,23,24,25,26,27, 28,29, and 32 with alveoloplasty;  Surgeon: Jamie Howell, DDS;  Location: [facility] OR;  Service: Oral Surgery;  Laterality: N/A;  . Cardiac catheterization      06/2014  . Transcatheter aortic valve replacement, transfemoral N/A 09/28/2014    Procedure: TRANSCATHETER AORTIC VALVE REPLACEMENT, TRANSFEMORAL;  Surgeon: Jamie Bollman, MD;  Location: [facility] OR;  Service: Open Heart Surgery;  Laterality: N/A;  . Tee without  cardioversion N/A 09/28/2014    Procedure: TRANSESOPHAGEAL ECHOCARDIOGRAM (TEE);  Surgeon: Jamie Bollman, MD;  Location: [facility] OR;  Service: Open Heart Surgery;  Laterality: N/A;  . Cardiac valve replacement    . Laparoscopic cholecystectomy  05-30-90  . Brain surgery    . Wound exploration Left 10/20/2014    Procedure: WOUND EXPLORATION;  Surgeon: Jamie Nails, MD;  Location: [facility] OR;  Service: Thoracic;  Laterality: Left;  . Application of wound  vac Left 10/20/2014    Procedure: APPLICATION OF WOUND VAC;  Surgeon: Jamie Nails, MD;  Location: [facility] OR;  Service: Thoracic;  Laterality: Left;    Current Outpatient Prescriptions  Medication Sig Dispense Refill  . amiodarone (PACERONE) 200 MG tablet Take 1 tablet (200 mg total) by mouth daily. 30 tablet 1  . amLODipine (NORVASC) 2.5 MG tablet Take 2.5 mg by mouth daily.     Marland Kitchen aspirin EC 81 MG tablet Take 1 tablet (81 mg total) by mouth daily. (Patient taking differently: Take 81 mg by mouth daily with lunch. )    . Calcium Carb-Cholecalciferol (CALCIUM 1000 + D PO) Take 1,000 mg by mouth daily with lunch.     . cyclobenzaprine (FLEXERIL) 10 MG tablet Take 5 mg by mouth at bedtime.     . ferrous sulfate 325 (65 FE) MG tablet Take 1 tablet (325 mg total) by mouth daily with breakfast. 30 tablet 5  . furosemide (LASIX) 40 MG tablet Take 1 tablet (40 mg total) by mouth 2 (two) times daily. (Patient taking differently: Take 40 mg by mouth 2 (two) times daily. Morning and lunch time)    . levothyroxine (SYNTHROID, LEVOTHROID) 150 MCG tablet Take 150 mcg by mouth daily before breakfast.     . metoprolol succinate (TOPROL-XL) 50 MG 24 hr tablet TAKE ONE TABLET BY MOUTH ONCE DAILY WITH A MEAL OR IMMEDIATELY FOLLOWING A MEAL. (Patient taking differently: TAKE ONE TABLET BY MOUTH ONCE DAILY AT BEDTIME) 30 tablet 3  . oxyCODONE-acetaminophen (PERCOCET/ROXICET) 5-325 MG per tablet Take 0.5 tablets by mouth at bedtime as needed (pain). 20 tablet 0  . pantoprazole (PROTONIX) 40 MG tablet Take 1 tablet (40 mg total) by mouth daily. 30 tablet 5  . potassium chloride (K-DUR) 10 MEQ tablet Take 10 mEq by mouth daily with lunch.     . warfarin (COUMADIN) 5 MG tablet Take 0.5 tablets (2.5 mg total) by mouth at bedtime. Or as directed.     No current facility-administered medications for this visit.    Allergies:   Adhesive; Penicillins; and Sulfa antibiotics   Social History:  The patient  reports that she  has never smoked. She has never used smokeless tobacco. She reports that she does not drink alcohol or use illicit drugs.   Family History:  The patient's  family history includes CAD in her father and mother; Cirrhosis in her brother; Heart attack in her sister; Hypertension in her father.   ROS:  Please see the history of present illness.  Otherwise, review of systems is positive for easy bruising.  All other systems are reviewed and negative.   PHYSICAL EXAM: VS:  BP 130/90 mmHg  Pulse 85  Ht 4\' 11"  (1.499 m)  Wt 193 lb 1.9 oz (87.599 kg)  BMI 38.98 kg/m2  SpO2 92% , BMI Body mass index is 38.98 kg/(m^2). GEN: Well nourished, well developed, overweight woman in no acute distress HEENT: normal Neck: no JVD, no masses. No carotid bruits Cardiac: RRR with grade 1/6 SEM  at the RUSB, normal mechanical S1, and no diastolic murmur           Respiratory:  clear to auscultation bilaterally, normal work of breathing GI: soft, nontender, nondistended, + BS MS: no deformity or atrophy Ext: trace pretibial edema on left Skin: warm and dry, no rash Neuro:  Strength and sensation are intact Psych: euthymic mood, full affect  EKG:  EKG is not ordered today.  Recent Labs: 05/12/2014: Pro B Natriuretic peptide (BNP) 406.0* 09/24/2014: ALT 23 09/29/2014: Magnesium 2.4 10/21/2014: BUN 19; Creatinine, Ser 1.17*; Potassium 4.5; Sodium 134* 10/23/2014: TSH 6.445* 10/24/2014: Hemoglobin 10.5*; Platelets 48*   Lipid Panel  No results found for: CHOL, TRIG, HDL, CHOLHDL, VLDL, LDLCALC, LDLDIRECT    Wt Readings from Last 3 Encounters:  11/19/14 193 lb 1.9 oz (87.599 kg)  11/15/14 190 lb (86.183 kg)  10/25/14 190 lb 9.6 oz (86.456 kg)   Cardiac Studies Reviewed: 2D Echo images reviewed. Formal report pending  ASSESSMENT AND PLAN: 64 year old woman with rheumatic heart disease status post mechanical mitral valve replacement and recent TAVR. She is doing well from a cardiac perspective with [place]  Heart Association functional class I symptoms at present. She will continue her current regimen of aspirin and warfarin in the setting of a mechanical mitral valve. Her femoral wound is followed by Dr. Cornelius Howell and she continues with home health, appears to be progressing well. I am going to order a CBC to follow her thrombocytopenia. I recommended that she see Dr. Mayford Knife back in 3 months.  I will see back in one year in valve clinic with an echocardiogram   Current medicines are reviewed with the patient today.  The patient does not have concerns regarding medicines.  Labs/ tests ordered today include:  No orders of the defined types were placed in this encounter.    Disposition:   FU as above  Signed, Jamie Bollman, MD  11/19/2014 11:40 AM    [facility] [address] [place], [place], [place]  [address] Phone: [phone]; Fax: [phone]

## 2014-11-19 NOTE — Patient Instructions (Addendum)
Medication Instructions:  Your physician recommends that you continue on your current medications as directed. Please refer to the Current Medication list given to you today.  Labwork: Your physician recommends that you have lab work: CBC (written order given to the patient to have this drawn by the home health nurse today)  Testing/Procedures: Your physician has requested that you have an echocardiogram in 1 YEAR. Echocardiography is a painless test that uses sound waves to create images of your heart. It provides your doctor with information about the size and shape of your heart and how well your heart's chambers and valves are working. This procedure takes approximately one hour. There are no restrictions for this procedure.  Follow-Up: Your physician wants you to follow-up in: 1 YEAR with Dr Excell Seltzer. You will receive a reminder letter in the mail two months in advance. If you don't receive a letter, please call our office to schedule the follow-up appointment.   Any Other Special Instructions Will Be Listed Below (If Applicable).  Your physician discussed the importance of taking an antibiotic prior to any dental, gastrointestinal, genitourinary procedures to prevent damage to the heart valves from infection. You were given a prescription for an antibiotic based on current SBE prophylaxis guidelines.

## 2014-11-22 ENCOUNTER — Telehealth: Payer: Self-pay

## 2014-11-22 NOTE — Telephone Encounter (Signed)
Home health nurse called to say that she will be d/c'ing the wound vac and start wet to dry dressing changes on incision site. The wound is approximately 5.5 cm in length, 2 cm in width and 0.2 cm depth.

## 2014-11-23 ENCOUNTER — Ambulatory Visit: Payer: Self-pay | Admitting: Cardiology

## 2014-12-10 NOTE — Telephone Encounter (Signed)
ERROR

## 2014-12-13 ENCOUNTER — Ambulatory Visit (INDEPENDENT_AMBULATORY_CARE_PROVIDER_SITE_OTHER): Payer: 59 | Admitting: Thoracic Surgery (Cardiothoracic Vascular Surgery)

## 2014-12-13 ENCOUNTER — Encounter: Payer: Self-pay | Admitting: Thoracic Surgery (Cardiothoracic Vascular Surgery)

## 2014-12-13 VITALS — BP 138/88 | HR 85 | Resp 20 | Ht 59.0 in | Wt 190.0 lb

## 2014-12-13 DIAGNOSIS — Z954 Presence of other heart-valve replacement: Secondary | ICD-10-CM

## 2014-12-13 DIAGNOSIS — IMO0001 Reserved for inherently not codable concepts without codable children: Secondary | ICD-10-CM

## 2014-12-13 DIAGNOSIS — T814XXD Infection following a procedure, subsequent encounter: Secondary | ICD-10-CM | POA: Diagnosis not present

## 2014-12-13 DIAGNOSIS — T8131XD Disruption of external operation (surgical) wound, not elsewhere classified, subsequent encounter: Secondary | ICD-10-CM

## 2014-12-13 DIAGNOSIS — T8131XA Disruption of external operation (surgical) wound, not elsewhere classified, initial encounter: Secondary | ICD-10-CM | POA: Insufficient documentation

## 2014-12-13 DIAGNOSIS — Z952 Presence of prosthetic heart valve: Secondary | ICD-10-CM

## 2014-12-13 DIAGNOSIS — Z953 Presence of xenogenic heart valve: Secondary | ICD-10-CM | POA: Insufficient documentation

## 2014-12-13 NOTE — Progress Notes (Signed)
[address]       [name] [address]             [phone]     CARDIOTHORACIC SURGERY OFFICE NOTE  Referring Provider is Tonny Bollman, MD  Primary Cardiologist is Quintella Reichert, MD PCP is Londell Moh, MD   HPI:  Patient returns to the office today for wound check related to left groin wound infection status post transcatheter aortic valve replacement via open left transfemoral approach on 09/28/2014. She was last seen here in our office on 11/15/2014 at which time she was making good progress. Since then finished healing completely and the home health nurse has stopped coming to visit.  Clinically the patient is otherwise doing well and she plans to go back to work next week. She does complain that she still gets short of breath fairly easily when she is walking outside. She has not enrolled in outpatient cardiac rehabilitation program.   Current Outpatient Prescriptions  Medication Sig Dispense Refill  . amiodarone (PACERONE) 200 MG tablet Take 1 tablet (200 mg total) by mouth daily. 30 tablet 1  . amLODipine (NORVASC) 2.5 MG tablet Take 2.5 mg by mouth daily.     Marland Kitchen aspirin EC 81 MG tablet Take 1 tablet (81 mg total) by mouth daily. (Patient taking differently: Take 81 mg by mouth daily with lunch. )    . Calcium Carb-Cholecalciferol (CALCIUM 1000 + D PO) Take 1,000 mg by mouth daily with lunch.     . cyclobenzaprine (FLEXERIL) 10 MG tablet Take 5 mg by mouth at bedtime.     . ferrous sulfate 325 (65 FE) MG tablet Take 1 tablet (325 mg total) by mouth daily with breakfast. 30 tablet 5  . furosemide (LASIX) 40 MG tablet Take 1 tablet (40 mg total) by mouth 2 (two) times daily. (Patient taking differently: Take 40 mg by mouth 2 (two) times daily. Morning and lunch time)    . levothyroxine (SYNTHROID, LEVOTHROID) 150 MCG tablet Take 150 mcg by mouth daily before breakfast.     . metoprolol succinate (TOPROL-XL) 50 MG 24 hr tablet TAKE ONE TABLET BY  MOUTH ONCE DAILY WITH A MEAL OR IMMEDIATELY FOLLOWING A MEAL. (Patient taking differently: TAKE ONE TABLET BY MOUTH ONCE DAILY AT BEDTIME) 30 tablet 3  . oxyCODONE-acetaminophen (PERCOCET/ROXICET) 5-325 MG per tablet Take 0.5 tablets by mouth at bedtime as needed (pain). 20 tablet 0  . pantoprazole (PROTONIX) 40 MG tablet Take 1 tablet (40 mg total) by mouth daily. 30 tablet 5  . potassium chloride (K-DUR) 10 MEQ tablet Take 10 mEq by mouth daily with lunch.     . warfarin (COUMADIN) 5 MG tablet Take 0.5 tablets (2.5 mg total) by mouth at bedtime. Or as directed.     No current facility-administered medications for this visit.      Physical Exam:   BP 138/88 mmHg  Pulse 85  Resp 20  Ht 4\' 11"  (1.499 m)  Wt 190 lb (86.183 kg)  BMI 38.35 kg/m2  SpO2   General:  Morbidly obese but well appearing  Chest:   Clear to auscultation  CV:   Regular rate and rhythm  Incisions:  Completely healed. There is chronic maceration medially in the patient's skin folds but her surgical incision has healed and there is no sign of any residual infection.  Abdomen:  Soft and nontender  Extremities:  Warm and well-perfused  Diagnostic Tests:  n/a   Impression:  Patient's left groin wound has healed completely. Clinically she seems to be doing very well otherwise. I think she would benefit from participation in the outpatient cardiac rehabilitation program.  Plan:  I have encouraged the patient to participate in outpatient cardiac rehabilitation program. We have not recommended any changes to her medications at this time. She has been reminded to keep both groins clean and dry at all times. In the future she will call and return to see [place] as needed. Will continue to follow-up with Dr. Mayford Knife and Dr. Excell Seltzer as previously planned.  I spent in excess of 10 minutes during the conduct of this office consultation and >50% of this time involved direct face-to-face encounter with the patient for counseling  and/or coordination of their care.   Salvatore Decent. Cornelius Moras, MD 12/13/2014 1:38 PM

## 2014-12-13 NOTE — Patient Instructions (Signed)
Keep your groin clean and dry  You may resume unrestricted physical activity without any particular limitations at this time.  The patient is encouraged to enroll and participate in the outpatient cardiac rehab program beginning as soon as practical.  You may return to work without any limitations at this time.

## 2014-12-21 ENCOUNTER — Other Ambulatory Visit: Payer: Self-pay | Admitting: Cardiology

## 2014-12-25 ENCOUNTER — Other Ambulatory Visit: Payer: Self-pay | Admitting: Cardiology

## 2014-12-28 ENCOUNTER — Ambulatory Visit (INDEPENDENT_AMBULATORY_CARE_PROVIDER_SITE_OTHER): Payer: 59 | Admitting: Physician Assistant

## 2014-12-28 ENCOUNTER — Encounter: Payer: Self-pay | Admitting: Physician Assistant

## 2014-12-28 VITALS — BP 126/70 | HR 80 | Ht 59.0 in | Wt 199.2 lb

## 2014-12-28 DIAGNOSIS — Z79899 Other long term (current) drug therapy: Secondary | ICD-10-CM | POA: Diagnosis not present

## 2014-12-28 DIAGNOSIS — I5021 Acute systolic (congestive) heart failure: Secondary | ICD-10-CM

## 2014-12-28 DIAGNOSIS — R0789 Other chest pain: Secondary | ICD-10-CM

## 2014-12-28 LAB — BASIC METABOLIC PANEL
BUN: 30 mg/dL — AB (ref 7–25)
CHLORIDE: 100 mmol/L (ref 98–110)
CO2: 27 mmol/L (ref 20–31)
Calcium: 9 mg/dL (ref 8.6–10.4)
Creat: 1.31 mg/dL — ABNORMAL HIGH (ref 0.50–0.99)
Glucose, Bld: 116 mg/dL — ABNORMAL HIGH (ref 65–99)
POTASSIUM: 4.2 mmol/L (ref 3.5–5.3)
Sodium: 137 mmol/L (ref 135–146)

## 2014-12-28 MED ORDER — METOLAZONE 2.5 MG PO TABS: 2.5000 mg | ORAL_TABLET | Freq: Every day | ORAL | Status: DC

## 2014-12-28 NOTE — Patient Instructions (Signed)
Your physician recommends that you schedule a follow-up appointment in: Next Wednesday  Your physician has recommended you make the following change in your medication: Take Metolazone 2.5 mg daily until you lose 4lbs and Increase potassium 40 mg daily  Your physician recommends that you return for lab work in: BMP today and BMP on Thursday

## 2014-12-28 NOTE — Progress Notes (Signed)
Cardiology Office Note   Date:  12/28/2014   ID:  RHYEN Howell, DOB Mar 29, 1951, MRN [id number]  PCP:  Londell Moh, MD  Cardiologist:  Dr Vale Haven, PA-C   Chief Complaint  Patient presents with  . Chest Pain    patient reports chest pressure since last saturday, wakes her up from sleep, shortness of breath-progressively has gotten worse, swelling in in legs-left leg worse.    History of Present Illness: Jamie Howell is a 64 y.o. female with a history of TAVR 06/20116 and L groin wound infection, St Jude mech MVR, PAF, on coumadin, D-CHF, pulm HTN, and anemia.    Jamie Howell presents for 3 mo f/u of TAVR and evaluation of shortness of breath.  [name] has been gradually building fluid. Her weight is up at least 8 pounds in the last couple of weeks. She has noticed increasing lower extremity edema and increasing dyspnea on exertion. She has recently gone back to work and struggled with this because of dyspnea on exertion and fatigue. Recently, she has developed orthopnea and possibly one or 2 episodes of PND.  As far as her left groin wound infection goes, it has finally healed completely. She has had no recent palpitations. She is compliant with her Coumadin and this is followed by her PCP. She has had no bleeding issues. She has had some mild chest pain in the setting of her dyspnea on exertion, but only after she developed her volume overload.   Past Medical History  Diagnosis Date  . HTN (hypertension)   . Thyrotoxicosis      without mention of goiter or other cause, without mention of thyrotoxic crisis or storm  . Hypothyroidism   . 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 Chronic recurrent paroxysmal atrial fibrillation   . Atrial flutter   . Subdural hematoma   . Pulmonary HTN     mild  . Aortic valve stenosis     moderate aortic valve stenosis with moderate aortic insufficiency, stable MVR,  trivial PR, mild TR, severe LAE, grade II-III diastolic dysfunction,mild pulmonary HTN , EF 50% by echo 12/2012  . 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 Chronic recurrent paroxysmal atrial fibrillation   . Obesity (BMI 30-39.9) 07/08/2014  . Splenomegaly 07/14/2014  . Portal hypertension with esophageal varices 07/14/2014  . Heart murmur   . Temporary low platelet count   . 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 CHF (congestive heart failure)     "I think so" (10/18/2014)  . Pneumonia ~ 2014; 05/2014  . Anemia   . History of blood transfusion 09/2014    "during hospitalization"    Past Surgical History  Procedure Laterality Date  . Mitral valve replacement  04/08/1991    31mm St Jude mechanical valve - Dr Andrey Campanile  . Left and right heart catheterization with coronary angiogram N/A 06/28/2014    Procedure: LEFT AND RIGHT HEART CATHETERIZATION WITH CORONARY ANGIOGRAM;  Surgeon: Tonny Bollman, MD; Normal coronaries, MVR OK, severe AS, elevated R heart pressures    . Subdural hematoma evacuation via craniotomy  2000    Dr Dutch Quint  . Total abdominal hysterectomy  07-23-1994  . Tonsillectomy    .  Multiple extractions with alveoloplasty N/A 08/05/2014    Procedure: Extraction of tooth #'s 3,4,5,7,8,9,10,11,12,13,17,20,21,22,23,24,25,26,27, 28,29, and 32 with alveoloplasty;  Surgeon: Charlynne Pander, DDS;  Location: [facility] OR;  Service: Oral Surgery;  Laterality: N/A;  . Cardiac catheterization      06/2014  . Transcatheter aortic valve replacement, transfemoral N/A 09/28/2014    Procedure: TRANSCATHETER AORTIC VALVE REPLACEMENT, TRANSFEMORAL;  Surgeon: Tonny Bollman, MD;  Location: [facility] OR;  Service: Open Heart Surgery;  Laterality: N/A;  . Tee without cardioversion N/A 09/28/2014     Procedure: TRANSESOPHAGEAL ECHOCARDIOGRAM (TEE);  Surgeon: Tonny Bollman, MD;  Location: [facility] OR;  Service: Open Heart Surgery;  Laterality: N/A;  . Cardiac valve replacement    . Laparoscopic cholecystectomy  05-30-90  . Brain surgery    . Wound exploration Left 10/20/2014    Procedure: WOUND EXPLORATION;  Surgeon: [name], MD;  Location: [facility] OR;  Service: Thoracic;  Laterality: Left;  . Application of wound vac Left 10/20/2014    Procedure: APPLICATION OF WOUND VAC;  Surgeon: [name], MD;  Location: [facility] OR;  Service: Thoracic;  Laterality: Left;    Current Outpatient Prescriptions  Medication Sig Dispense Refill  . amiodarone (PACERONE) 200 MG tablet Take 1 tablet (200 mg total) by mouth daily. 30 tablet 1  . amLODipine (NORVASC) 2.5 MG tablet Take 2.5 mg by mouth daily.     Marland Kitchen amLODipine (NORVASC) 2.5 MG tablet TAKE ONE TABLET BY MOUTH ONCE DAILY 30 tablet 0  . aspirin EC 81 MG tablet Take 1 tablet (81 mg total) by mouth daily. (Patient taking differently: Take 81 mg by mouth daily with lunch. )    . Calcium Carb-Cholecalciferol (CALCIUM 1000 + D PO) Take 1,000 mg by mouth daily with lunch.     . cyclobenzaprine (FLEXERIL) 10 MG tablet Take 5 mg by mouth at bedtime.     . ferrous sulfate 325 (65 FE) MG tablet Take 1 tablet (325 mg total) by mouth daily with breakfast. 30 tablet 5  . furosemide (LASIX) 40 MG tablet Take 1 tablet (40 mg total) by mouth 2 (two) times daily. (Patient taking differently: Take 40 mg by mouth 2 (two) times daily. Morning and lunch time)    . KLOR-CON M10 10 MEQ tablet TAKE ONE TABLET BY MOUTH ONCE DAILY 30 tablet 11  . levothyroxine (SYNTHROID, LEVOTHROID) 150 MCG tablet Take 150 mcg by mouth daily before breakfast.     . metoprolol succinate (TOPROL-XL) 50 MG 24 hr tablet TAKE 1 TABLET BY MOUTH ONCE DAILY WITH A MEAL OR IMMEDIATELY FOLLOWING A MEAL 30 tablet 0  . oxyCODONE-acetaminophen (PERCOCET/ROXICET) 5-325 MG per tablet Take 0.5 tablets by mouth at  bedtime as needed (pain). 20 tablet 0  . pantoprazole (PROTONIX) 40 MG tablet Take 1 tablet (40 mg total) by mouth daily. 30 tablet 5  . potassium chloride (K-DUR) 10 MEQ tablet Take 10 mEq by mouth daily with lunch.     . warfarin (COUMADIN) 5 MG tablet Take 0.5 tablets (2.5 mg total) by mouth at bedtime. Or as directed.     No current facility-administered medications for this visit.    Allergies:   Adhesive; Penicillins; and Sulfa antibiotics    Social History:  The patient  reports that she has never smoked. She has never used smokeless tobacco. She reports that she does not drink alcohol or use illicit drugs.   Family History:  The patient's family history includes CAD in her father and mother; Cirrhosis in  her brother; Heart attack in her sister; Hypertension in her father.    ROS:  Please see the history of present illness. All other systems are reviewed and negative.    PHYSICAL EXAM: VS:  BP 126/70 mmHg  Pulse 80  Ht  (1.499 m)  Wt 199 lb 3.2 oz (90.357 kg)  BMI 40.21 kg/m2 , BMI Body mass index is 40.21 kg/(m^2). GEN: Well nourished, well developed, female in no acute distress HEENT: normal Neck: JVD at 10 cm, no carotid bruits but murmur radiates to her carotids, no masses Cardiac: RRR; systolic and diastolic murmurs noted, heard best at the apex, crisp valve click, no rubs, or gallops, 2+ bilateral lower extremity edema  Respiratory:  Bibasilar rales GI: soft, nontender, nondistended, + BS MS: no deformity or atrophy Skin: warm and dry, no rash Neuro:  Strength and sensation are intact Psych: euthymic mood, full affect   EKG:  EKG is ordered today. The ekg ordered today demonstrates sinus rhythm with intraventricular conduction defect, possibly incomplete right bundle branch block which is not new. She also has a first-degree AV block which is more prolonged than in previous ECGs.  Echo: 11/19/2014 Conclusions - Left ventricle: The cavity size was normal.  Wall thickness was increased in a pattern of mild LVH. Systolic function was mildly to moderately reduced. The estimated ejection fraction was in the range of 40% to 45%. There is hypokinesis of the inferoseptal myocardium. - Ventricular septum: Septal motion showed paradox. - Aortic valve: TAVR valve, 26mm Edwards-Sapien functioning normally. There was no regurgitation. Mean gradient (S): 6 mm Hg. Peak gradient (S): 10 mm Hg. - Mitral valve: A mechanical prosthesis was present and functioning normally. Mean gradient (D): 4 mm Hg. Peak gradient (D): 10 mmHg. - Left atrium: The atrium was moderately dilated. - Right ventricle: The cavity size was moderately dilated. Wall thickness was normal. - Right atrium: The atrium was severely dilated. - Tricuspid valve: There was moderate regurgitation. Mean gradient (D): 5 mm Hg. Peak gradient (D): 14 mm Hg. - Pulmonary arteries: Systolic pressure was mildly increased. PA peak pressure: 38 mm Hg (S). Impressions: - When compared to prior echocardiogram, EF is reduced.  Recent Labs: 05/12/2014: Pro B Natriuretic peptide (BNP) 406.0* 09/24/2014: ALT 23 09/29/2014: Magnesium 2.4 10/21/2014: BUN 19; Creatinine, Ser 1.17*; Potassium 4.5; Sodium 134* 10/23/2014: TSH 6.445* 10/24/2014: Hemoglobin 10.5*; Platelets 48*    Lipid Panel  Wt Readings from Last 3 Encounters:  12/28/14 199 lb 3.2 oz (90.357 kg)  12/13/14 190 lb (86.183 kg)  11/19/14 193 lb 1.9 oz (87.599 kg)     Other studies Reviewed: Additional studies/ records that were reviewed today include: Hospital records, previous ECGs and office notes.  ASSESSMENT AND PLAN:  1.  Acute systolic CHF: Ms. Venters is obviously volume overloaded by exam and describes heart failure symptoms. Admission to the hospital was recommended.  The patient strongly wishes not to be admitted. She is struggling with financial issues secondary to her medical bills, and says she just cannot  afford it. She states it would dramatically increased her stress level if we insisted on admission.  Therefore, we'll give her a brief trial of outpatient therapy and see how this is tolerated. We will add metolazone to her medication regimen and try to pull off 4 or 5 pounds of fluid. Once the fluid is improved, we will continue her current Lasix dose and see if this is enough. She will take additional potassium when she takes the metolazone.  If her symptoms do not dramatically improve, she will need admission. We will check a BMET today and in 2 days, to make sure that she is tolerating the metolazone well. We will also bring her in for repeat visit next week. At that time, if there is not significant improvement, we will check a chest x-ray and additional labs, and more strongly recommend admission. As I think her symptoms will significantly improve with 5 pounds of diuresis, I will not insist on admission at this time.  2. Chest pain: She has a recent clean cath so do not feel this is primarily ischemic in origin. Follow symptoms with diuresis.  Current medicines are reviewed at length with the patient today.  The patient does not have concerns regarding medicines.  The following changes have been made:  Add metolazone and increased potassium when she takes it.  Labs/ tests ordered today include:   Orders Placed This Encounter  Procedures  . Basic metabolic panel  . Basic metabolic panel  . EKG 12-Lead   Disposition:   FU with me next week and with Dr. Excell Seltzer as scheduled   Signed, Leanna Battles  12/28/2014 9:32 AM    [facility] [address] [place], [place], [place]  [address] Phone: [phone]; Fax: [phone]

## 2014-12-30 LAB — BASIC METABOLIC PANEL
BUN: 31 mg/dL — ABNORMAL HIGH (ref 7–25)
CO2: 32 mmol/L — ABNORMAL HIGH (ref 20–31)
Calcium: 9 mg/dL (ref 8.6–10.4)
Chloride: 95 mmol/L — ABNORMAL LOW (ref 98–110)
Creat: 1.36 mg/dL — ABNORMAL HIGH (ref 0.50–0.99)
Glucose, Bld: 140 mg/dL — ABNORMAL HIGH (ref 65–99)
Potassium: 3.3 mmol/L — ABNORMAL LOW (ref 3.5–5.3)
SODIUM: 134 mmol/L — AB (ref 135–146)

## 2015-01-04 ENCOUNTER — Encounter: Payer: Self-pay | Admitting: Physician Assistant

## 2015-01-04 ENCOUNTER — Ambulatory Visit (INDEPENDENT_AMBULATORY_CARE_PROVIDER_SITE_OTHER): Payer: 59 | Admitting: Physician Assistant

## 2015-01-04 VITALS — BP 124/78 | HR 68 | Ht 59.0 in | Wt 185.8 lb

## 2015-01-04 DIAGNOSIS — I480 Paroxysmal atrial fibrillation: Secondary | ICD-10-CM | POA: Diagnosis not present

## 2015-01-04 DIAGNOSIS — I499 Cardiac arrhythmia, unspecified: Secondary | ICD-10-CM | POA: Diagnosis not present

## 2015-01-04 DIAGNOSIS — Z79899 Other long term (current) drug therapy: Secondary | ICD-10-CM | POA: Diagnosis not present

## 2015-01-04 DIAGNOSIS — I4891 Unspecified atrial fibrillation: Secondary | ICD-10-CM | POA: Diagnosis not present

## 2015-01-04 DIAGNOSIS — I5032 Chronic diastolic (congestive) heart failure: Secondary | ICD-10-CM

## 2015-01-04 LAB — BASIC METABOLIC PANEL
BUN: 34 mg/dL — ABNORMAL HIGH (ref 7–25)
CHLORIDE: 91 mmol/L — AB (ref 98–110)
CO2: 32 mmol/L — ABNORMAL HIGH (ref 20–31)
Calcium: 9 mg/dL (ref 8.6–10.4)
Creat: 1.26 mg/dL — ABNORMAL HIGH (ref 0.50–0.99)
GLUCOSE: 225 mg/dL — AB (ref 65–99)
POTASSIUM: 3 mmol/L — AB (ref 3.5–5.3)
Sodium: 135 mmol/L (ref 135–146)

## 2015-01-04 NOTE — Patient Instructions (Addendum)
SEE DR. ALLRED TOMORROW AT THE [address] OFFICE.  Your physician recommends that you schedule a follow-up appointment in: November WITH DR. Mayford Knife   Your physician recommends that you schedule a follow-up appointment in: NEXT AVAILABLE WITH DR. Excell Seltzer  Your physician has recommended you make the following change in your medication: DO NOT TAKE THE METOPROLOL UNTIL INSTRUCTED DIFFERENTLY.  TAKE METOLAZONE 2 TIMES A WEEK AS NEEDED FOR SWELLING/WEIGHT GAIN AND TAKE AN EXTRA POTASSIUM ON THESE DAYS.  Your physician recommends that you weigh, daily, at the same time every day, and in the same amount of clothing. Please record your daily weights on the handout provided and bring it to your next appointment.   Daily Weight Record It is important to weigh yourself daily. Keep this daily weight chart near your scale. Weigh yourself each morning at the same time. Weigh yourself without shoes and with the same amount of clothes each day. Compare today's weight to yesterday's weight. Bring this form with you to your follow-up appointments. Call your caregiver if you gain 03 lb/1.4 kg in 1 day. Call your caregiver if you gain 05 lb/2.3 kg in a week. Date: ________ Weight: ____________________ Date: ________ Weight: ____________________ Date: ________ Weight: ____________________ Date: ________ Weight: ____________________ Date: ________ Weight: ____________________ Date: ________ Weight: ____________________ Date: ________ Weight: ____________________ Date: ________ Weight: ____________________ Date: ________ Weight: ____________________ Date: ________ Weight: ____________________ Date: ________ Weight: ____________________ Date: ________ Weight: ____________________ Date: ________ Weight: ____________________ Date: ________ Weight: ____________________ Date: ________ Weight: ____________________ Date: ________ Weight: ____________________ Date: ________ Weight: ____________________ Date: ________  Weight: ____________________ Date: ________ Weight: ____________________ Date: ________ Weight: ____________________ Date: ________ Weight: ____________________ Date: ________ Weight: ____________________ Date: ________ Weight: ____________________ Date: ________ Weight: ____________________ Date: ________ Weight: ____________________ Date: ________ Weight: ____________________ Date: ________ Weight: ____________________ Date: ________ Weight: ____________________ Date: ________ Weight: ____________________ Date: ________ Weight: ____________________ Date: ________ Weight: ____________________ Date: ________ Weight: ____________________ Date: ________ Weight: ____________________ Date: ________ Weight: ____________________ Date: ________ Weight: ____________________ Date: ________ Weight: ____________________ Date: ________ Weight: ____________________ Date: ________ Weight: ____________________ Date: ________ Weight: ____________________ Date: ________ Weight: ____________________ Date: ________ Weight: ____________________ Date: ________ Weight: ____________________ Date: ________ Weight: ____________________ Date: ________ Weight: ____________________ Date: ________ Weight: ____________________ Date: ________ Weight: ____________________ Date: ________ Weight: ____________________ Date: ________ Weight: ____________________ Date: ________ Weight: ____________________ Date: ________ Weight: ____________________ Document Released: 05/31/2006 Document Revised: 06/11/2011 Document Reviewed: 03/07/2007 [employer] Patient Information 2015 [employer]. This information is not intended to replace advice given to you by your health care provider. Make sure you discuss any questions you have with your health care provider.  Low-Sodium Eating Plan Sodium raises blood pressure and causes water to be held in the body. Getting less sodium from food will help lower your blood pressure, reduce any  swelling, and protect your heart, liver, and kidneys. We get sodium by adding salt (sodium chloride) to food. Most of our sodium comes from canned, boxed, and frozen foods. Restaurant foods, fast foods, and pizza are also very high in sodium. Even if you take medicine to lower your blood pressure or to reduce fluid in your body, getting less sodium from your food is important.   WHAT DO I NEED TO KNOW ABOUT THIS EATING PLAN? For the low-sodium eating plan, you will follow these general guidelines:  Choose foods with a % Daily Value for sodium of less than 5% (as listed on the food label).   Use salt-free seasonings or herbs instead of table salt or sea salt.  Check with your health care provider or pharmacist before using salt substitutes.   Eat fresh foods.  Eat more vegetables and fruits.  Limit canned vegetables. If you do use them, rinse them well to decrease the sodium.   Limit cheese to 1 oz (28 g) per day.   Eat lower-sodium products, often labeled as "lower sodium" or "no salt added."  Avoid foods that contain monosodium glutamate (MSG). MSG is sometimes added to [place] food and some canned foods.  Check food labels (Nutrition Facts labels) on foods to learn how much sodium is in one serving.  Eat more home-cooked food and less restaurant, buffet, and fast food.  When eating at a restaurant, ask that your food be prepared with less salt or none, if possible.  HOW DO I READ FOOD LABELS FOR SODIUM INFORMATION? The Nutrition Facts label lists the amount of sodium in one serving of the food. If you eat more than one serving, you must multiply the listed amount of sodium by the number of servings. Food labels may also identify foods as:  Sodium free--Less than 5 mg in a serving.  Very low sodium--35 mg or less in a serving.  Low sodium--140 mg or less in a serving.  Light in sodium--50% less sodium in a serving. For example, if a food that usually has 300 mg of  sodium is changed to become light in sodium, it will have 150 mg of sodium.  Reduced sodium--25% less sodium in a serving. For example, if a food that usually has 400 mg of sodium is changed to reduced sodium, it will have 300 mg of sodium. WHAT FOODS CAN I EAT? Grains Low-sodium cereals, including oats, puffed wheat and rice, and shredded wheat cereals. Low-sodium crackers. Unsalted rice and pasta. Lower-sodium bread.  Vegetables Frozen or fresh vegetables. Low-sodium or reduced-sodium canned vegetables. Low-sodium or reduced-sodium tomato sauce and paste. Low-sodium or reduced-sodium tomato and vegetable juices.  Fruits Fresh, frozen, and canned fruit. Fruit juice.  Meat and Other Protein Products Low-sodium canned tuna and salmon. Fresh or frozen meat, poultry, seafood, and fish. Lamb. Unsalted nuts. Dried beans, peas, and lentils without added salt. Unsalted canned beans. Homemade soups without salt. Eggs.  Dairy Milk. Soy milk. Ricotta cheese. Low-sodium or reduced-sodium cheeses. Yogurt.  Condiments Fresh and dried herbs and spices. Salt-free seasonings. Onion and garlic powders. Low-sodium varieties of mustard and ketchup. Lemon juice.  Fats and Oils Reduced-sodium salad dressings. Unsalted butter.  Other Unsalted popcorn and pretzels.  The items listed above may not be a complete list of recommended foods or beverages. Contact your dietitian for more options. WHAT FOODS ARE NOT RECOMMENDED? Grains Instant hot cereals. Bread stuffing, pancake, and biscuit mixes. Croutons. Seasoned rice or pasta mixes. Noodle soup cups. Boxed or frozen macaroni and cheese. Self-rising flour. Regular salted crackers. Vegetables Regular canned vegetables. Regular canned tomato sauce and paste. Regular tomato and vegetable juices. Frozen vegetables in sauces. Salted french fries. Olives. Rosita Fire. Relishes. Sauerkraut. Salsa. Meat and Other Protein Products Salted, canned, smoked, spiced, or  pickled meats, seafood, or fish. Bacon, ham, sausage, hot dogs, corned beef, chipped beef, and packaged luncheon meats. Salt pork. Jerky. Pickled herring. Anchovies, regular canned tuna, and sardines. Salted nuts. Dairy Processed cheese and cheese spreads. Cheese curds. Blue cheese and cottage cheese. Buttermilk.  Condiments Onion and garlic salt, seasoned salt, table salt, and sea salt. Canned and packaged gravies. Worcestershire sauce. Tartar sauce. Barbecue sauce. Teriyaki sauce. Soy sauce, including reduced sodium. Steak sauce. Fish  sauce. Oyster sauce. Cocktail sauce. Horseradish. Regular ketchup and mustard. Meat flavorings and tenderizers. Bouillon cubes. Hot sauce. Tabasco sauce. Marinades. Taco seasonings. Relishes. Fats and Oils Regular salad dressings. Salted butter. Margarine. Ghee. Bacon fat.  Other Potato and tortilla chips. Corn chips and puffs. Salted popcorn and pretzels. Canned or dried soups. Pizza. Frozen entrees and pot pies.  The items listed above may not be a complete list of foods and beverages to avoid. Contact your dietitian for more information. Document Released: 09/08/2001 Document Revised: 03/24/2013 Document Reviewed: 01/21/2013 [facility] Patient Information 2015 [place], [place]. This information is not intended to replace advice given to you by your health care provider. Make sure you discuss any questions you have with your health care provider.

## 2015-01-04 NOTE — Progress Notes (Signed)
Cardiology Office Note   Date:  01/04/2015   ID:  Jamie Howell, DOB 1950-06-18, MRN [id number]  PCP:  Londell Moh, MD  Cardiologist:  Dr Mayford Knife TAVR MD: Dr Vale Haven, PA-C   Chief Complaint  Patient presents with  . Follow-up    1 week:  One episode of tingling in chest when she woke in the middle of the night upon sitting up in the bed lasting 10-15 minutes. Lost 6 lbs in two day after starting the Metolazone, so only took the 2 doses.    History of Present Illness: Jamie Howell is a 64 y.o. female with a history of TAVR 06/20116 and L groin wound infection (which finally healed), St Jude mech MVR, PAF, on coumadin, D-CHF, pulm HTN, and anemia.   Ms. Jamie Howell had been gradually building fluid. She was struggling with dyspnea on exertion, edema and fatigue. She had developed orthopnea and possibly one or 2 episodes of PND.  She is compliant with her Coumadin and this is followed by her PCP. She has had no bleeding issues.  She was seen in the office 09/27 and was volume overloaded. She refused hospital admission. She was started on metolazone and is here today for f/u evaluation and labs.   She has lost 14 pounds since being started on the metolazone. She took it 2 days in a row as requested. After that, she quit taking metolazone, but continued the Lasix. She stated she became very vigilant about watching her sodium. The weight continued to decrease and her legs are not swelling is much during the day as they had been.  She is compliant with her medications and feels that she is doing much better with a low-sodium diet. Her dyspnea on exertion has greatly improved. She is very happy with the weight loss.  Past Medical History  Diagnosis Date  . HTN (hypertension)   . Thyrotoxicosis      without mention of goiter or other cause, without mention of thyrotoxic crisis or storm  . Hypothyroidism   . 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 Chronic recurrent paroxysmal atrial fibrillation   . Atrial flutter (HCC)   . Subdural hematoma (HCC)   . Pulmonary HTN (HCC)     mild  . Aortic valve stenosis     moderate aortic valve stenosis with moderate aortic insufficiency, stable MVR, trivial PR, mild TR, severe LAE, grade II-III diastolic dysfunction,mild pulmonary HTN , EF 50% by echo 12/2012  . 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 Chronic recurrent paroxysmal atrial fibrillation   . Obesity (BMI 30-39.9) 07/08/2014  . Splenomegaly 07/14/2014  . Portal hypertension with esophageal varices (HCC) 07/14/2014  . Heart murmur   . Temporary low platelet count (HCC)   . 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 CHF (congestive heart failure) (HCC)     "I think so" (10/18/2014)  . Pneumonia ~ 2014; 05/2014  . Anemia   . History of blood transfusion 09/2014    "during hospitalization"    Past Surgical History  Procedure Laterality Date  . Mitral valve replacement  04/08/1991    31mm St Jude mechanical valve - Dr Andrey Campanile  . Left and right  heart catheterization with coronary angiogram N/A 06/28/2014    Procedure: LEFT AND RIGHT HEART CATHETERIZATION WITH CORONARY ANGIOGRAM;  Surgeon: Tonny Bollman, MD; Normal coronaries, MVR OK, severe AS, elevated R heart pressures    . Subdural hematoma evacuation via craniotomy  2000    Dr Dutch Quint  . Total abdominal hysterectomy  07-23-1994  . Tonsillectomy    . Multiple extractions with alveoloplasty N/A 08/05/2014    Procedure: Extraction of tooth #'s 3,4,5,7,8,9,10,11,12,13,17,20,21,22,23,24,25,26,27, 28,29, and 32 with alveoloplasty;  Surgeon: Charlynne Pander, DDS;  Location: [facility] OR;  Service: Oral Surgery;  Laterality: N/A;  . Cardiac catheterization        06/2014  . Transcatheter aortic valve replacement, transfemoral N/A 09/28/2014    Procedure: TRANSCATHETER AORTIC VALVE REPLACEMENT, TRANSFEMORAL;  Surgeon: Tonny Bollman, MD;  Location: [facility] OR;  Service: Open Heart Surgery;  Laterality: N/A;  . Tee without cardioversion N/A 09/28/2014    Procedure: TRANSESOPHAGEAL ECHOCARDIOGRAM (TEE);  Surgeon: Tonny Bollman, MD;  Location: [facility] OR;  Service: Open Heart Surgery;  Laterality: N/A;  . Cardiac valve replacement    . Laparoscopic cholecystectomy  05-30-90  . Brain surgery    . Wound exploration Left 10/20/2014    Procedure: WOUND EXPLORATION;  Surgeon: [name], MD;  Location: [facility] OR;  Service: Thoracic;  Laterality: Left;  . Application of wound vac Left 10/20/2014    Procedure: APPLICATION OF WOUND VAC;  Surgeon: [name], MD;  Location: [facility] OR;  Service: Thoracic;  Laterality: Left;    Current Outpatient Prescriptions  Medication Sig Dispense Refill  . amiodarone (PACERONE) 200 MG tablet Take 1 tablet (200 mg total) by mouth daily. 30 tablet 1  . amLODipine (NORVASC) 2.5 MG tablet TAKE ONE TABLET BY MOUTH ONCE DAILY 30 tablet 0  . aspirin EC 81 MG tablet Take 1 tablet (81 mg total) by mouth daily.    . Calcium Carb-Cholecalciferol (CALCIUM 1000 + D PO) Take 1,000 mg by mouth daily with lunch.     . cyclobenzaprine (FLEXERIL) 10 MG tablet Take 5 mg by mouth at bedtime.     . ferrous sulfate 325 (65 FE) MG tablet Take 1 tablet (325 mg total) by mouth daily with breakfast. 30 tablet 5  . furosemide (LASIX) 40 MG tablet Take 1 tablet (40 mg total) by mouth 2 (two) times daily.    Marland Kitchen KLOR-CON M10 10 MEQ tablet TAKE ONE TABLET BY MOUTH ONCE DAILY 30 tablet 11  . levothyroxine (SYNTHROID, LEVOTHROID) 150 MCG tablet Take 150 mcg by mouth daily before breakfast.     . metolazone (ZAROXOLYN) 2.5 MG tablet Take 1 tablet (2.5 mg total) by mouth daily. (Patient taking differently: Take 2.5 mg by mouth daily as needed. ) 6 tablet 0  . metoprolol  succinate (TOPROL-XL) 50 MG 24 hr tablet TAKE 1 TABLET BY MOUTH ONCE DAILY WITH A MEAL OR IMMEDIATELY FOLLOWING A MEAL 30 tablet 0  . potassium chloride (K-DUR) 10 MEQ tablet Take 10 mEq by mouth daily with lunch.     . warfarin (COUMADIN) 5 MG tablet Take 0.5 tablets (2.5 mg total) by mouth at bedtime. Or as directed.     No current facility-administered medications for this visit.    Allergies:   Adhesive; Penicillins; and Sulfa antibiotics    Social History:  The patient  reports that she has never smoked. She has never used smokeless tobacco. She reports that she does not drink alcohol or use illicit drugs.   Family History:  The  patient's family history includes CAD in her father and mother; Cirrhosis in her brother; Heart attack in her sister; Hypertension in her father.    ROS:  Please see the history of present illness. All other systems are reviewed and negative.    PHYSICAL EXAM: VS:  BP 124/78 mmHg  Pulse 68  Ht  (1.499 m)  Wt 185 lb 12.8 oz (84.278 kg)  BMI 37.51 kg/m2 , BMI Body mass index is 37.51 kg/(m^2). GEN: Well nourished, well developed, in no acute distress HEENT: normal Neck: Minimal JVD, mild hepatojugular reflux, radiation of murmur to her carotids, no masses Cardiac: RRR; systolic and diastolic murmurs, crisp valve click, no rubs, or gallops, trace lower extremity edema  Respiratory:  Decreased breath sounds bases bilaterally but good air exchange, normal work of breathing GI: soft, nontender, nondistended, + BS MS: no deformity or atrophy Skin: warm and dry, no rash Neuro:  Strength and sensation are intact Psych: euthymic mood, full affect   EKG:  EKG is ordered today. The ekg ordered today demonstrates atrial tachycardia versus slow flutter with possible A-V dissociation, heart rate 68.   Recent Labs: 05/12/2014: Pro B Natriuretic peptide (BNP) 406.0* 09/24/2014: ALT 23 09/29/2014: Magnesium 2.4 10/23/2014: TSH 6.445* 10/24/2014: Hemoglobin  10.5*; Platelets 48* 12/28/2014: BUN 30*; Creat 1.31*; Potassium 4.2; Sodium 137    Lipid Panel No results found for: CHOL, TRIG, HDL, CHOLHDL, VLDL, LDLCALC, LDLDIRECT   Wt Readings from Last 3 Encounters:  01/04/15 185 lb 12.8 oz (84.278 kg)  12/28/14 199 lb 3.2 oz (90.357 kg)  12/13/14 190 lb (86.183 kg)     Other studies Reviewed: Additional studies/ records that were reviewed today include: Previous ECGs, hospital records and previous office notes..  ASSESSMENT AND PLAN:  1.  Chronic diastolic CHF: Her weight is down 14 pounds from her last office visit week ago. She is much more compliant with sodium restrictions.   We will check a BMET today to make sure that her potassium and renal function are tolerating diuresis. She is to continue to take Lasix daily with potassium.  It is okay for her to take metolazone with an extra potassium tablet up to 2 times a week as needed for weight gain or edema.  2. Arrhythmia: There is concern that her ECG represents A-V dissociation with an atrial tachycardia. Her P waves are greatly different in morphology from her previous P waves. The atrial rate is approximately 150. There is no consistent pedal are interval.   Dr. Johney Frame reviewed the ECG and will see her tomorrow. In the meantime, she is to hold her beta blocker. She will continue the amiodarone.   Current medicines are reviewed at length with the patient today.  The patient does not have concerns regarding medicines.  The following changes have been made:  Hold metoprolol  Labs/ tests ordered today include:   Orders Placed This Encounter  Procedures  . Basic metabolic panel  . EKG 12-Lead     Disposition:   FU with Dr. Johney Frame tomorrow, and Dr. Mayford Knife in 3 months   Signed, Leanna Battles  01/04/2015 4:14 PM    [facility] [address] [place], [place], [place]  [address] Phone: [phone]; Fax: [phone]

## 2015-01-05 ENCOUNTER — Encounter: Payer: Self-pay | Admitting: Internal Medicine

## 2015-01-05 ENCOUNTER — Ambulatory Visit (INDEPENDENT_AMBULATORY_CARE_PROVIDER_SITE_OTHER): Payer: Commercial Managed Care - HMO | Admitting: Internal Medicine

## 2015-01-05 VITALS — BP 138/82 | HR 82 | Ht 59.0 in | Wt 186.8 lb

## 2015-01-05 DIAGNOSIS — Z952 Presence of prosthetic heart valve: Secondary | ICD-10-CM

## 2015-01-05 DIAGNOSIS — I480 Paroxysmal atrial fibrillation: Secondary | ICD-10-CM | POA: Diagnosis not present

## 2015-01-05 DIAGNOSIS — I484 Atypical atrial flutter: Secondary | ICD-10-CM

## 2015-01-05 DIAGNOSIS — I10 Essential (primary) hypertension: Secondary | ICD-10-CM

## 2015-01-05 DIAGNOSIS — Z954 Presence of other heart-valve replacement: Secondary | ICD-10-CM

## 2015-01-05 NOTE — Progress Notes (Signed)
Electrophysiology Office Note   Date:  01/05/2015   ID:  Jamie Howell, DOB [birth year], MRN [id number]  PCP:  Londell Moh, MD  Cardiologist:  Dr Armanda Magic Primary Electrophysiologist: Hillis Range, MD    Chief Complaint  Patient presents with  . PAF     History of Present Illness: Jamie Howell is a 64 y.o. female who presents today for electrophysiology evaluation.   She presents for further evaluation of her rhythm.  She has a h/o rheumatic fever as a child.  She underwent MVR (mechanical) by Dr Andrey Campanile in 1993.  She has a h/o afib.  She has been on coumadin for years.  She had SDH (spontaneous) in 2000.  She receovered followoing burr hole procedure.  She is s/p TAVR 09/28/2014.  Patient has hepatic cirrhosis, chronic thrombocytopenia, and splenomegaly. Her early postoperative TAVR course was complicated by a wound infection. She underwent surgical treatment of the left groin and now has a wound VAC in place. She has been followed closely by Dr. Cornelius Moras. She has recently had difficulty with edema.  She has been able to manage this with sodium restriction and medical therapy.  She has a "tingling" of her chest wall but denies palpitations.She has been on amiodarone since June for atypical atrial flutter/ atrial tachycardia.  Today, she denies symptoms of orthopnea, PND, claudication, dizziness, presyncope, syncope, bleeding, or neurologic sequela. The patient is tolerating medications without difficulties and is otherwise without complaint today.    Past Medical History  Diagnosis Date  . HTN (hypertension)   . Thyrotoxicosis      without mention of goiter or other cause, without mention of thyrotoxic crisis or storm  . Hypothyroidism   . 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 Chronic recurrent paroxysmal atrial fibrillation   . Atrial flutter (HCC)   . Subdural hematoma (HCC)   . Pulmonary HTN (HCC)     mild  . Aortic valve  stenosis     moderate aortic valve stenosis with moderate aortic insufficiency, stable MVR, trivial PR, mild TR, severe LAE, grade II-III diastolic dysfunction,mild pulmonary HTN , EF 50% by echo 12/2012  . 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 Chronic recurrent paroxysmal atrial fibrillation   . Obesity (BMI 30-39.9) 07/08/2014  . Splenomegaly 07/14/2014  . Portal hypertension with esophageal varices (HCC) 07/14/2014  . Heart murmur   . Temporary low platelet count (HCC)   . 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 CHF (congestive heart failure) (HCC)     "I think so" (10/18/2014)  . Pneumonia ~ 2014; 05/2014  . Anemia   . History of blood transfusion 09/2014    "during hospitalization"   Past Surgical History  Procedure Laterality Date  . Mitral valve replacement  04/08/1991    31mm St Jude mechanical valve - Dr Andrey Campanile  . Left and right heart catheterization with coronary angiogram N/A 06/28/2014    Procedure: LEFT AND RIGHT HEART CATHETERIZATION WITH CORONARY ANGIOGRAM;  Surgeon: Tonny Bollman, MD; Normal coronaries, MVR OK, severe AS, elevated R heart pressures    . Subdural hematoma evacuation via craniotomy  2000    Dr Dutch Quint  . Total abdominal hysterectomy  07-23-1994  . Tonsillectomy    .  Multiple extractions with alveoloplasty N/A 08/05/2014    Procedure: Extraction of tooth #'s 3,4,5,7,8,9,10,11,12,13,17,20,21,22,23,24,25,26,27, 28,29, and 32 with alveoloplasty;  Surgeon: Charlynne Pander, DDS;  Location: [facility] OR;  Service: Oral Surgery;  Laterality: N/A;  . Cardiac catheterization      06/2014  . Transcatheter aortic valve replacement, transfemoral N/A 09/28/2014    Procedure: TRANSCATHETER AORTIC VALVE REPLACEMENT, TRANSFEMORAL;  Surgeon: Tonny Bollman, MD;   Location: [facility] OR;  Service: Open Heart Surgery;  Laterality: N/A;  . Tee without cardioversion N/A 09/28/2014    Procedure: TRANSESOPHAGEAL ECHOCARDIOGRAM (TEE);  Surgeon: Tonny Bollman, MD;  Location: [facility] OR;  Service: Open Heart Surgery;  Laterality: N/A;  . Cardiac valve replacement    . Laparoscopic cholecystectomy  05-30-90  . Brain surgery    . Wound exploration Left 10/20/2014    Procedure: WOUND EXPLORATION;  Surgeon: [name], MD;  Location: [facility] OR;  Service: Thoracic;  Laterality: Left;  . Application of wound vac Left 10/20/2014    Procedure: APPLICATION OF WOUND VAC;  Surgeon: [name], MD;  Location: [facility] OR;  Service: Thoracic;  Laterality: Left;     Current Outpatient Prescriptions  Medication Sig Dispense Refill  . amiodarone (PACERONE) 200 MG tablet Take 1 tablet (200 mg total) by mouth daily. 30 tablet 1  . amLODipine (NORVASC) 2.5 MG tablet TAKE ONE TABLET BY MOUTH ONCE DAILY 30 tablet 0  . aspirin EC 81 MG tablet Take 1 tablet (81 mg total) by mouth daily.    . Calcium Carb-Cholecalciferol (CALCIUM 1000 + D PO) Take 1,000 mg by mouth daily with lunch.     . cyclobenzaprine (FLEXERIL) 10 MG tablet Take 5 mg by mouth at bedtime.     . ferrous sulfate 325 (65 FE) MG tablet Take 1 tablet (325 mg total) by mouth daily with breakfast. 30 tablet 5  . furosemide (LASIX) 40 MG tablet Take 1 tablet (40 mg total) by mouth 2 (two) times daily.    Marland Kitchen KLOR-CON M10 10 MEQ tablet TAKE ONE TABLET BY MOUTH ONCE DAILY 30 tablet 11  . levothyroxine (SYNTHROID, LEVOTHROID) 150 MCG tablet Take 150 mcg by mouth daily before breakfast.     . metolazone (ZAROXOLYN) 2.5 MG tablet Take 2.5 mg by mouth daily as needed (swelling).    . metoprolol succinate (TOPROL-XL) 50 MG 24 hr tablet TAKE 1 TABLET BY MOUTH ONCE DAILY WITH A MEAL OR IMMEDIATELY FOLLOWING A MEAL 30 tablet 0  . potassium chloride (K-DUR) 10 MEQ tablet Take 10 mEq by mouth daily with lunch.     . warfarin (COUMADIN) 5 MG tablet  Take 0.5 tablets (2.5 mg total) by mouth at bedtime. Or as directed.     No current facility-administered medications for this visit.    Allergies:   Adhesive; Penicillins; and Sulfa antibiotics   Social History:  The patient  reports that she has never smoked. She has never used smokeless tobacco. She reports that she does not drink alcohol or use illicit drugs.   Family History:  The patient's  family history includes CAD in her father and mother; Cirrhosis in her brother; Heart attack in her sister; Hypertension in her father.    ROS:  Please see the history of present illness.   All other systems are reviewed and negative.    PHYSICAL EXAM: VS:  BP 138/82 mmHg  Pulse 82  Ht  (1.499 m)  Wt 186 lb 12.8 oz (84.732 kg)  BMI 37.71 kg/m2 , BMI  Body mass index is 37.71 kg/(m^2). GEN: Well nourished, well developed, in no acute distress HEENT: normal Neck: no JVD, carotid bruits, or masses Cardiac: iRRR; + mechanical S1, + edema Respiratory:  clear to auscultation bilaterally, normal work of breathing GI: soft, nontender, nondistended, + BS MS: no deformity or atrophy Skin: warm and dry  Neuro:  Strength and sensation are intact Psych: euthymic mood, full affect  EKG:  EKG is ordered today. The ekg ordered today shows atypical atrial flutter vs atach, V rate 82 bpm, nonspecific ST/ T changes   Recent Labs: 05/12/2014: Pro B Natriuretic peptide (BNP) 406.0* 09/24/2014: ALT 23 09/29/2014: Magnesium 2.4 10/23/2014: TSH 6.445* 10/24/2014: Hemoglobin 10.5*; Platelets 48* 01/04/2015: BUN 34*; Creat 1.26*; Potassium 3.0*; Sodium 135    Lipid Panel  No results found for: CHOL, TRIG, HDL, CHOLHDL, VLDL, LDLCALC, LDLDIRECT   Wt Readings from Last 3 Encounters:  01/05/15 186 lb 12.8 oz (84.732 kg)  01/04/15 185 lb 12.8 oz (84.278 kg)  12/28/14 199 lb 3.2 oz (90.357 kg)      Other studies Reviewed: Additional studies/ records that were reviewed today include: multiple ekgs,  Dr Randolm Idol notes, Dr Barry Dienes notes, Ms Barrett's notes   ASSESSMENT AND PLAN:  1.  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 Therapeutic strategies for these arrhythmias including medicine and ablation were discussed in detail with the patient today. Risk, benefits, and alternatives to EP study and radiofrequency ablation for afib were also discussed in detail today.  She is clear that she has no interests in ablation.  I have spoken with Dr Excell Seltzer today who notes that due to very significant venous disease that she may actually not be a candidate for ablation anyway. He and I agree that medical therapy is best. She has been on amiodarone for several months.  I would therefore advise cardioversion at this time. Risks, benefits, and alternatives to cardioversion were discussed at length with the patient who wishes to proceed. Will check weekly INRs and proceed in 3-4 weeks.  IF she does not feel better in sinus or fails to maintain sinus then I would advise a rate control strategy and stopping amidoarone going forward (particularly given that she has cirrhosis).  If she receives clinical benefit from sinus rhythm then it may be best to continue on low dose amiodarone going forward.  2. Valvular heart disease She has not been very compliant with INR checks recently Compliance encouraged Will need weekly INRs prior to cardioversion Doing well s/p tavr Will follow with Dr Excell Seltzer  3. ? Bradycardia Sent to me today due to concerns of AV block.  I have reviewed all of her ekgs.  She does not have any evidence of AV block.  She has atach/ atypical atrial flutter with variable conduction.  Resume toprol.  No indication for pacing at this time  4. HTN Stable No change required today  5. Obesity Weight loss advised  Follow-up with Drs Excell Seltzer and Mayford Knife as scheduled I willl see as needed  going forward.  Current medicines are reviewed at length with the patient today.   The patient does not have concerns regarding her medicines.  The following changes were made today:  none   Signed, Hillis Range, MD  01/05/2015 3:24 PM     [facility] [address] [place] [address] [phone] (office) [phone] (fax)

## 2015-01-05 NOTE — Patient Instructions (Addendum)
Medication Instructions:  Your physician recommends that you continue on your current medications as directed. Please refer to the Current Medication list given to you today.   Labwork: Your physician recommends that you return for lab work  Needs weekly INR's   Testing/Procedures None ordered   Follow-Up: Your physician recommends that you schedule a follow-up appointment as scheduled with Dr Excell Seltzer  Will need a Cardioversion once we have 4 therapeutic INR's( 4 readings of at least 2.0)    Any Other Special Instructions Will Be Listed Below (If Applicable).  WIll have weekly INR's done at Dr Carolee Rota office and faxed to us--order given

## 2015-01-06 ENCOUNTER — Encounter: Payer: Self-pay | Admitting: Cardiology

## 2015-01-06 LAB — PROTIME-INR
INR: 3.59 — AB (ref <1.50)
Prothrombin Time: 36.4 seconds — ABNORMAL HIGH (ref 11.6–15.2)

## 2015-01-07 DIAGNOSIS — I484 Atypical atrial flutter: Secondary | ICD-10-CM | POA: Insufficient documentation

## 2015-01-09 NOTE — Progress Notes (Signed)
Cardiology Office Note Date:  01/10/2015   ID:  Jamie Howell, DOB 07/12/1950, MRN [id number]  PCP:  Londell MohPHARR,[name], MD  Cardiologist:  Tonny Bollmanooper, Shaterria Sager, MD    Chief Complaint  Patient presents with  . Palpitations   History of Present Illness: Jamie Howell is a 64 y.o. female who presents for follow-up evaluation. She has rheumatic heart disease with previous mechanical mitral valve replacement on chronic warfarin therapy. She underwent TAVR 09/28/2014. Patient has hepatic cirrhosis, chronic thrombocytopenia, and splenomegaly. Her platelet count has been ranging around 50,000. She has not had any recent bleeding complications.  Patient was seen by Dr. Johney FrameAllred last week. She was felt to have atypical atrial flutter versus ectopic atrial tachycardia. Cardioversion is recommended after 3 weeks of therapeutic anticoagulation. Her first therapeutic INR was 3.6 on October 5th. INR's are going to be followed at Dr Carolee RotaPharr's office, next lab draw tomorrow.   The patient complains of occasional palpitations. She has a tingling in her chest at times. She had edema in her legs over the weekend and took metolazone. She denies orthopnea, PND, or resting shortness of breath. Dyspnea with exertion is stable.  Past Medical History  Diagnosis Date  . HTN (hypertension)   . Thyrotoxicosis      without mention of goiter or other cause, without mention of thyrotoxic crisis or storm  . Hypothyroidism   . 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 Atypical atrial flutter (HCC)   . Subdural hematoma (HCC)     spontaneous  . Pulmonary HTN (HCC)     mild  . Aortic valve stenosis     moderate aortic valve stenosis with moderate aortic insufficiency, stable MVR, trivial PR, mild TR, severe LAE, grade II-III diastolic dysfunction,mild pulmonary HTN , EF 50% by echo 12/2012  . S/P mitral valve replacement with metallic valve 04/08/1991    31 mm St Jude bileaflet  mechanical valve placed for rheumatic mitral stenosis  . Persistent atrial fibrillation (HCC)   . Obesity (BMI 30-39.9) 07/08/2014  . Splenomegaly 07/14/2014  . Portal hypertension with esophageal varices (HCC) 07/14/2014  . Temporary low platelet count (HCC)   . 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 CHF (congestive heart failure) (HCC)     "I think so" (10/18/2014)  . Pneumonia ~ 2014; 05/2014  . Anemia   . History of blood transfusion 09/2014    "during hospitalization"  . Rheumatic fever     Past Surgical History  Procedure Laterality Date  . Mitral valve replacement  04/08/1991    31mm St Jude mechanical valve - Dr Andrey CampanileWilson  . Left and right heart catheterization with coronary angiogram N/A 06/28/2014    Procedure: LEFT AND RIGHT HEART CATHETERIZATION WITH CORONARY ANGIOGRAM;  Surgeon: Tonny BollmanMichael Cornesha Radziewicz, MD; Normal coronaries, MVR OK, severe AS, elevated R heart pressures    . Subdural hematoma evacuation via craniotomy  2000    Dr Dutch QuintPoole  . Total abdominal hysterectomy  07-23-1994  . Tonsillectomy    . Multiple extractions with alveoloplasty N/A 08/05/2014    Procedure: Extraction of tooth #'s 3,4,5,7,8,9,10,11,12,13,17,20,21,22,23,24,25,26,27, 28,29, and 32 with alveoloplasty;  Surgeon: Charlynne Panderonald F Kulinski, DDS;  Location: [facility] OR;  Service: Oral Surgery;  Laterality: N/A;  . Cardiac catheterization      06/2014  . Transcatheter aortic valve replacement, transfemoral N/A 09/28/2014  Procedure: TRANSCATHETER AORTIC VALVE REPLACEMENT, TRANSFEMORAL;  Surgeon: Tonny Bollman, MD;  Location: [facility] OR;  Service: Open Heart Surgery;  Laterality: N/A;  . Tee without cardioversion N/A 09/28/2014    Procedure: TRANSESOPHAGEAL ECHOCARDIOGRAM (TEE);  Surgeon: Tonny Bollman, MD;  Location: [facility] OR;  Service: Open Heart Surgery;  Laterality: N/A;  . Cardiac valve replacement    . Laparoscopic  cholecystectomy  05-30-90  . Brain surgery    . Wound exploration Left 10/20/2014    Procedure: WOUND EXPLORATION;  Surgeon: [name], MD;  Location: [facility] OR;  Service: Thoracic;  Laterality: Left;  . Application of wound vac Left 10/20/2014    Procedure: APPLICATION OF WOUND VAC;  Surgeon: [name], MD;  Location: [facility] OR;  Service: Thoracic;  Laterality: Left;    Current Outpatient Prescriptions  Medication Sig Dispense Refill  . amiodarone (PACERONE) 200 MG tablet Take 1 tablet (200 mg total) by mouth daily. 30 tablet 1  . amLODipine (NORVASC) 2.5 MG tablet TAKE ONE TABLET BY MOUTH ONCE DAILY 30 tablet 0  . aspirin EC 81 MG tablet Take 1 tablet (81 mg total) by mouth daily.    . Calcium Carb-Cholecalciferol (CALCIUM 1000 + D PO) Take 1,000 mg by mouth daily with lunch.     . cyclobenzaprine (FLEXERIL) 10 MG tablet Take 5 mg by mouth at bedtime.     . ferrous sulfate 325 (65 FE) MG tablet Take 1 tablet (325 mg total) by mouth daily with breakfast. 30 tablet 5  . furosemide (LASIX) 40 MG tablet Take 1 tablet (40 mg total) by mouth 2 (two) times daily.    Marland Kitchen KLOR-CON M10 10 MEQ tablet TAKE ONE TABLET BY MOUTH ONCE DAILY 30 tablet 11  . levothyroxine (SYNTHROID, LEVOTHROID) 150 MCG tablet Take 150 mcg by mouth daily before breakfast.     . metolazone (ZAROXOLYN) 2.5 MG tablet Take 2.5 mg by mouth daily as needed (swelling).    . metoprolol succinate (TOPROL-XL) 50 MG 24 hr tablet TAKE 1 TABLET BY MOUTH ONCE DAILY WITH A MEAL OR IMMEDIATELY FOLLOWING A MEAL 30 tablet 0  . warfarin (COUMADIN) 5 MG tablet Take 0.5 tablets (2.5 mg total) by mouth at bedtime. Or as directed.     No current facility-administered medications for this visit.    Allergies:   Adhesive; Penicillins; and Sulfa antibiotics   Social History:  The patient  reports that she has never smoked. She has never used smokeless tobacco. She reports that she does not drink alcohol or use illicit drugs.   Family History:   The patient's family history includes CAD in her father and mother; Cirrhosis in her brother; Heart attack in her sister; Hypertension in her father.    ROS:  Please see the history of present illness.  Otherwise, review of systems is positivleg swelling, easy bruising, fatigue, irregular heartbeats.  All other systems are reviewed and negative.    PHYSICAL EXAM: VS:  BP 120/70 mmHg  Pulse 68  Ht  (1.499 m)  Wt 184 lb 6.4 oz (83.643 kg)  BMI 37.22 kg/m2 , BMI Body mass index is 37.22 kg/(m^2). GEN: Well nourished, well developed, in no acute distress HEENT: normal Neck: no JVD, no masses.  Cardiac: RRR with  normal mechanical S1 and  grade 2/6 systolic ejection murmur at the left sternal border Respiratory:  clear to auscultation bilaterally, normal work of breathing GI: soft, nontender, nondistended, + BS MS: no deformity or atrophy Ext: no pretibial edema, marked  varicosities bilaterally Skin: warm and dry, no rash Neuro:  Strength and sensation are intact Psych: euthymic mood, full affect  EKG:  EKG is not ordered today.  Recent Labs: 05/12/2014: Pro B Natriuretic peptide (BNP) 406.0* 09/24/2014: ALT 23 09/29/2014: Magnesium 2.4 10/23/2014: TSH 6.445* 10/24/2014: Hemoglobin 10.5*; Platelets 48* 01/04/2015: BUN 34*; Creat 1.26*; Potassium 3.0*; Sodium 135   Lipid Panel  No results found for: CHOL, TRIG, HDL, CHOLHDL, VLDL, LDLCALC, LDLDIRECT    Wt Readings from Last 3 Encounters:  01/10/15 184 lb 6.4 oz (83.643 kg)  01/05/15 186 lb 12.8 oz (84.732 kg)  01/04/15 185 lb 12.8 oz (84.278 kg)    Cardiac Studies Reviewed: 2D Echo 11/19/2014: Study Conclusions  - Left ventricle: The cavity size was normal. Wall thickness was increased in a pattern of mild LVH. Systolic function was mildly to moderately reduced. The estimated ejection fraction was in the range of 40% to 45%. There is hypokinesis of the inferoseptal myocardium. - Ventricular septum: Septal motion  showed paradox. - Aortic valve: TAVR valve, 26mm Edwards-Sapien functioning normally. There was no regurgitation. Mean gradient (S): 6 mm Hg. Peak gradient (S): 10 mm Hg. - Mitral valve: A mechanical prosthesis was present and functioning normally. Mean gradient (D): 4 mm Hg. Peak gradient (D): 10 mm Hg. - Left atrium: The atrium was moderately dilated. - Right ventricle: The cavity size was moderately dilated. Wall thickness was normal. - Right atrium: The atrium was severely dilated. - Tricuspid valve: There was moderate regurgitation. Mean gradient (D): 5 mm Hg. Peak gradient (D): 14 mm Hg. - Pulmonary arteries: Systolic pressure was mildly increased. PA peak pressure: 38 mm Hg (S).  Impressions:  - When compared to prior echocardiogram, EF is reduced.  ASSESSMENT AND PLAN: 1.  Aortic stenosis s/p TAVR: stable valve function on post-op echo, exam unchanged.   2. Mitral valve disease s/p mechanical mitral valve replacement: on chronic warfarin.  3. Chronic thrombocytopenia: stable on recent labs  4. Ectopic atrial tachycardia: plans for DCCV after 3 weeks of therapeutic anticoagulation. Reviewed risks and alternatives with the patient. Might consider whether to continue ASA post-cardioversion - indicated with mechanical MVR but concerned about bleeding risk with cirrhosis and thrombocytopenia.   Current medicines are reviewed with the patient today.  The patient does not have concerns regarding medicines.  Labs/ tests ordered today include:  No orders of the defined types were placed in this encounter.   Disposition:   FU as planned with Dr Turner  Signed, Jessee Newnam, MD  01/10/2015 8:33 AM    [facility] [address], [place], [name]  [address] Phone: [phone]; Fax: [phone]   

## 2015-01-10 ENCOUNTER — Ambulatory Visit (INDEPENDENT_AMBULATORY_CARE_PROVIDER_SITE_OTHER): Payer: Commercial Managed Care - HMO | Admitting: Cardiovascular Disease

## 2015-01-10 ENCOUNTER — Encounter: Payer: Self-pay | Admitting: Cardiovascular Disease

## 2015-01-10 VITALS — BP 120/70 | HR 68 | Ht 59.0 in | Wt 184.4 lb

## 2015-01-10 DIAGNOSIS — I484 Atypical atrial flutter: Secondary | ICD-10-CM | POA: Diagnosis not present

## 2015-01-10 DIAGNOSIS — Z954 Presence of other heart-valve replacement: Secondary | ICD-10-CM | POA: Diagnosis not present

## 2015-01-10 DIAGNOSIS — Z952 Presence of prosthetic heart valve: Secondary | ICD-10-CM

## 2015-01-10 DIAGNOSIS — I350 Nonrheumatic aortic (valve) stenosis: Secondary | ICD-10-CM

## 2015-01-10 MED ORDER — METOLAZONE 2.5 MG PO TABS: 2.5000 mg | ORAL_TABLET | Freq: Every day | ORAL | Status: DC | PRN

## 2015-01-10 NOTE — Patient Instructions (Signed)
Medication Instructions:  Your physician recommends that you continue on your current medications as directed. Please refer to the Current Medication list given to you today.  Labwork: Your physician recommends that you return for lab work: Monday 01/31/15 (CBC, BMP and PT/INR), lab hours 7:30-5:00   Testing/Procedures: Your physician has recommended that you have a Cardioversion (DCCV). Electrical Cardioversion uses a jolt of electricity to your heart either through paddles or wired patches attached to your chest. This is a controlled, usually prescheduled, procedure. Defibrillation is done under light anesthesia in the hospital, and you usually go home the day of the procedure. This is done to get your heart back into a normal rhythm. You are not awake for the procedure. Please see the instruction sheet given to you today.  Follow-Up: Please keep your scheduled follow-up appointment with Dr Mayford Knife.   Any Other Special Instructions Will Be Listed Below (If Applicable).

## 2015-01-12 ENCOUNTER — Other Ambulatory Visit: Payer: Self-pay | Admitting: Physician Assistant

## 2015-01-12 ENCOUNTER — Telehealth: Payer: Self-pay | Admitting: Physician Assistant

## 2015-01-12 MED ORDER — POTASSIUM CHLORIDE CRYS ER 20 MEQ PO TBCR: 20.0000 meq | EXTENDED_RELEASE_TABLET | Freq: Every day | ORAL | Status: DC

## 2015-01-12 NOTE — Telephone Encounter (Signed)
At last BMET, K+ had been 3.0. She is doing well controlling the fluid with daily Lasix and prn metolazone, but K+ was low, even taking an extra 10 meq on metolazone days.  Plan: increase daily supplement to 20 meq. Take an extra 20 meq on metolazone days.  Rx sent in.  Called pt to let her know, she is otherwise doing well.   Theodore DemarkBarrett, Maximo Spratling, Cordelia Poche-C 01/12/2015 2:32 PM Beeper [phone]

## 2015-01-18 ENCOUNTER — Other Ambulatory Visit: Payer: Self-pay | Admitting: Cardiology

## 2015-01-25 LAB — PROTIME-INR: INR: 2.6 — AB (ref 0.9–1.1)

## 2015-01-26 ENCOUNTER — Encounter: Payer: Self-pay | Admitting: Cardiovascular Disease

## 2015-01-26 NOTE — Telephone Encounter (Signed)
This encounter was created in error - please disregard.

## 2015-01-26 NOTE — Telephone Encounter (Signed)
Follow Up  ° °Pt returned the call  °

## 2015-01-30 ENCOUNTER — Other Ambulatory Visit: Payer: Self-pay | Admitting: Cardiovascular Disease

## 2015-01-31 ENCOUNTER — Other Ambulatory Visit (INDEPENDENT_AMBULATORY_CARE_PROVIDER_SITE_OTHER): Payer: Commercial Managed Care - HMO | Admitting: *Deleted

## 2015-01-31 ENCOUNTER — Encounter: Payer: Self-pay | Admitting: Cardiovascular Disease

## 2015-01-31 DIAGNOSIS — I059 Rheumatic mitral valve disease, unspecified: Secondary | ICD-10-CM

## 2015-01-31 DIAGNOSIS — I10 Essential (primary) hypertension: Secondary | ICD-10-CM

## 2015-01-31 DIAGNOSIS — I480 Paroxysmal atrial fibrillation: Secondary | ICD-10-CM

## 2015-01-31 DIAGNOSIS — I272 Pulmonary hypertension, unspecified: Secondary | ICD-10-CM

## 2015-01-31 DIAGNOSIS — I5032 Chronic diastolic (congestive) heart failure: Secondary | ICD-10-CM

## 2015-01-31 LAB — BASIC METABOLIC PANEL
BUN: 54 mg/dL — ABNORMAL HIGH (ref 7–25)
CHLORIDE: 87 mmol/L — AB (ref 98–110)
CO2: 34 mmol/L — ABNORMAL HIGH (ref 20–31)
CREATININE: 1.66 mg/dL — AB (ref 0.50–0.99)
Calcium: 9.6 mg/dL (ref 8.6–10.4)
Glucose, Bld: 252 mg/dL — ABNORMAL HIGH (ref 65–99)
POTASSIUM: 2.6 mmol/L — AB (ref 3.5–5.3)
Sodium: 133 mmol/L — ABNORMAL LOW (ref 135–146)

## 2015-01-31 LAB — CBC WITH DIFFERENTIAL/PLATELET
BASOS ABS: 0 10*3/uL (ref 0.0–0.1)
Basophils Relative: 1 % (ref 0–1)
Eosinophils Absolute: 0.2 10*3/uL (ref 0.0–0.7)
Eosinophils Relative: 4 % (ref 0–5)
HEMATOCRIT: 39.3 % (ref 36.0–46.0)
HEMOGLOBIN: 13.7 g/dL (ref 12.0–15.0)
LYMPHS ABS: 0.8 10*3/uL (ref 0.7–4.0)
Lymphocytes Relative: 16 % (ref 12–46)
MCH: 32.7 pg (ref 26.0–34.0)
MCHC: 34.9 g/dL (ref 30.0–36.0)
MCV: 93.8 fL (ref 78.0–100.0)
MPV: 10.6 fL (ref 8.6–12.4)
Monocytes Absolute: 0.4 10*3/uL (ref 0.1–1.0)
Monocytes Relative: 9 % (ref 3–12)
NEUTROS PCT: 70 % (ref 43–77)
Neutro Abs: 3.4 10*3/uL (ref 1.7–7.7)
Platelets: 69 10*3/uL — ABNORMAL LOW (ref 150–400)
RBC: 4.19 MIL/uL (ref 3.87–5.11)
RDW: 15.8 % — ABNORMAL HIGH (ref 11.5–15.5)
WBC: 4.9 10*3/uL (ref 4.0–10.5)

## 2015-01-31 LAB — PROTIME-INR
INR: 2.43 — AB (ref <1.50)
Prothrombin Time: 26.8 seconds — ABNORMAL HIGH (ref 11.6–15.2)

## 2015-01-31 NOTE — Addendum Note (Signed)
Addended by: Tonita PhoenixBOWDEN, ROBIN K on: 01/31/2015 07:44 AM   Modules accepted: Orders

## 2015-02-01 ENCOUNTER — Ambulatory Visit (HOSPITAL_COMMUNITY)
Admission: RE | Admit: 2015-02-01 | Discharge: 2015-02-01 | Disposition: A | Discharge status: Home / Self Care | Payer: Commercial Managed Care - HMO | Source: Ambulatory Visit | Attending: Cardiology | Admitting: Cardiology

## 2015-02-01 ENCOUNTER — Encounter (HOSPITAL_COMMUNITY)
Admission: RE | Disposition: A | Discharge status: Home / Self Care | Payer: Self-pay | Source: Ambulatory Visit | Attending: Cardiology

## 2015-02-01 ENCOUNTER — Ambulatory Visit (HOSPITAL_COMMUNITY): Payer: Commercial Managed Care - HMO | Admitting: Anesthesiology

## 2015-02-01 ENCOUNTER — Telehealth: Payer: Self-pay | Admitting: Cardiology

## 2015-02-01 ENCOUNTER — Encounter (HOSPITAL_COMMUNITY): Payer: Self-pay | Admitting: *Deleted

## 2015-02-01 DIAGNOSIS — I110 Hypertensive heart disease with heart failure: Secondary | ICD-10-CM | POA: Insufficient documentation

## 2015-02-01 DIAGNOSIS — R161 Splenomegaly, not elsewhere classified: Secondary | ICD-10-CM | POA: Diagnosis not present

## 2015-02-01 DIAGNOSIS — D696 Thrombocytopenia, unspecified: Secondary | ICD-10-CM | POA: Insufficient documentation

## 2015-02-01 DIAGNOSIS — Z7982 Long term (current) use of aspirin: Secondary | ICD-10-CM | POA: Insufficient documentation

## 2015-02-01 DIAGNOSIS — Z952 Presence of prosthetic heart valve: Secondary | ICD-10-CM | POA: Diagnosis not present

## 2015-02-01 DIAGNOSIS — I4891 Unspecified atrial fibrillation: Secondary | ICD-10-CM | POA: Diagnosis not present

## 2015-02-01 DIAGNOSIS — I272 Other secondary pulmonary hypertension: Secondary | ICD-10-CM | POA: Diagnosis not present

## 2015-02-01 DIAGNOSIS — I5032 Chronic diastolic (congestive) heart failure: Secondary | ICD-10-CM

## 2015-02-01 DIAGNOSIS — K7460 Unspecified cirrhosis of liver: Secondary | ICD-10-CM | POA: Insufficient documentation

## 2015-02-01 DIAGNOSIS — Z6835 Body mass index (BMI) 35.0-35.9, adult: Secondary | ICD-10-CM | POA: Insufficient documentation

## 2015-02-01 DIAGNOSIS — I4819 Other persistent atrial fibrillation: Secondary | ICD-10-CM | POA: Diagnosis present

## 2015-02-01 DIAGNOSIS — I080 Rheumatic disorders of both mitral and aortic valves: Secondary | ICD-10-CM | POA: Insufficient documentation

## 2015-02-01 DIAGNOSIS — I484 Atypical atrial flutter: Secondary | ICD-10-CM | POA: Diagnosis not present

## 2015-02-01 DIAGNOSIS — E669 Obesity, unspecified: Secondary | ICD-10-CM | POA: Diagnosis not present

## 2015-02-01 DIAGNOSIS — Z7901 Long term (current) use of anticoagulants: Secondary | ICD-10-CM | POA: Insufficient documentation

## 2015-02-01 DIAGNOSIS — Z79899 Other long term (current) drug therapy: Secondary | ICD-10-CM | POA: Insufficient documentation

## 2015-02-01 DIAGNOSIS — I509 Heart failure, unspecified: Secondary | ICD-10-CM | POA: Diagnosis not present

## 2015-02-01 DIAGNOSIS — E039 Hypothyroidism, unspecified: Secondary | ICD-10-CM | POA: Diagnosis not present

## 2015-02-01 HISTORY — PX: CARDIOVERSION: SHX1299

## 2015-02-01 LAB — POCT I-STAT 4, (NA,K, GLUC, HGB,HCT)
GLUCOSE: 147 mg/dL — AB (ref 65–99)
HCT: 43 % (ref 36.0–46.0)
Hemoglobin: 14.6 g/dL (ref 12.0–15.0)
Potassium: 3.6 mmol/L (ref 3.5–5.1)
Sodium: 135 mmol/L (ref 135–145)

## 2015-02-01 SURGERY — CARDIOVERSION
Anesthesia: Monitor Anesthesia Care

## 2015-02-01 MED ORDER — PHENYLEPHRINE HCL 10 MG/ML IJ SOLN
INTRAMUSCULAR | Status: DC | PRN
  Administered 2015-02-01: 40 ug via INTRAVENOUS

## 2015-02-01 MED ORDER — PROPOFOL 10 MG/ML IV BOLUS
INTRAVENOUS | Status: DC | PRN
  Administered 2015-02-01: 50 mg via INTRAVENOUS

## 2015-02-01 MED ORDER — LACTATED RINGERS IV SOLN
INTRAVENOUS | Status: DC | PRN
  Administered 2015-02-01: 13:00:00 via INTRAVENOUS

## 2015-02-01 MED ORDER — SODIUM CHLORIDE 0.9 % IV SOLN
INTRAVENOUS | Status: DC
  Administered 2015-02-01: 500 mL via INTRAVENOUS

## 2015-02-01 MED ORDER — FUROSEMIDE 40 MG PO TABS: 40.0000 mg | ORAL_TABLET | Freq: Every day | ORAL | Status: DC

## 2015-02-01 MED ORDER — LIDOCAINE HCL (CARDIAC) 20 MG/ML IV SOLN
INTRAVENOUS | Status: DC | PRN
  Administered 2015-02-01: 60 mg via INTRAVENOUS

## 2015-02-01 NOTE — CV Procedure (Signed)
   Electrical Cardioversion Procedure Note Jamie Howell [id number] 02/28/1951  Procedure: Electrical Cardioversion Indications:  Atrial Fibrillation  Time Out: Verified patient identification, verified procedure,medications/allergies/relevent history reviewed, required imaging and test results available.  Performed  Procedure Details  The patient was NPO after midnight. Anesthesia was administered at the beside  by Dr.Turk with 50mg  of propofol and 60mg  Lidocaine.  Cardioversion was done with synchronized biphasic defibrillation with AP pads with 150watts.  The patient converted to normal sinus rhythm. The patient tolerated the procedure well   IMPRESSION:  Successful cardioversion of atrial fibrillation    Jamie Howell 02/01/2015, 1:01 PM

## 2015-02-01 NOTE — Telephone Encounter (Signed)
Instructed patient to DECREASE LASIX to once daily. BMET scheduled for next Tuesday. Patient agrees with treatment plan.

## 2015-02-01 NOTE — Transfer of Care (Signed)
Immediate Anesthesia Transfer of Care Note  Patient: Jamie Howell  Procedure(s) Performed: Procedure(s): CARDIOVERSION (N/A)  Patient Location: PACU  Anesthesia Type:MAC  Level of Consciousness: sedated and responds to stimulation  Airway & Oxygen Therapy: Patient Spontanous Breathing and Patient connected to nasal cannula oxygen  Post-op Assessment: Report given to RN and Post -op Vital signs reviewed and stable  Post vital signs: stable  Last Vitals:  Filed Vitals:   02/01/15 1210  BP: 129/65  Temp: 36.6 C  Resp: 21    Complications: No apparent anesthesia complications

## 2015-02-01 NOTE — Anesthesia Preprocedure Evaluation (Addendum)
Anesthesia Evaluation  Patient identified by MRN, date of birth, ID band Patient awake    Reviewed: Allergy & Precautions, NPO status , Patient's Chart, lab work & pertinent test results, reviewed documented beta blocker date and time   Airway Mallampati: II  TM Distance: >3 FB Neck ROM: Full    Dental  (+) Edentulous Upper, Edentulous Lower   Pulmonary shortness of breath and with exertion,    Pulmonary exam normal breath sounds clear to auscultation       Cardiovascular hypertension, Pt. on medications and Pt. on home beta blockers +CHF  + dysrhythmias Atrial Fibrillation  Rhythm:Irregular Rate:Abnormal + Systolic Click A-fib   Neuro/Psych negative neurological ROS  negative psych ROS   GI/Hepatic negative GI ROS,   Endo/Other  Hypothyroidism Obesity   Renal/GU Renal InsufficiencyRenal disease     Musculoskeletal negative musculoskeletal ROS (+)   Abdominal (+) + obese,   Peds  Hematology negative hematology ROS (+)   Anesthesia Other Findings   Reproductive/Obstetrics                           Anesthesia Physical Anesthesia Plan  ASA: III  Anesthesia Plan: MAC   Post-op Pain Management:    Induction: Intravenous  Airway Management Planned: Mask  Additional Equipment:   Intra-op Plan:   Post-operative Plan:   Informed Consent: I have reviewed the patients History and Physical, chart, labs and discussed the procedure including the risks, benefits and alternatives for the proposed anesthesia with the patient or authorized representative who has indicated his/her understanding and acceptance.   Dental advisory given  Plan Discussed with: CRNA and Anesthesiologist  Anesthesia Plan Comments: (Discussed risks/benefits/alternatives to MAC sedation including need for ventilatory support, hypotension, need for conversion to general anesthesia.  All patient questions answered.   Patient wished to proceed.)       Anesthesia Quick Evaluation S/P TAVR 09/27/24  S/P MVR 1993 for rheumatic disease on coumadin  INR 2.43 H/O portal htn and splenomegaly Chronic thrombocytopenia platelet count 69,000,  Renal insufficiency Cr 1.66 H/O Paroxysmal afib on amiodarone now in Afib

## 2015-02-01 NOTE — Telephone Encounter (Signed)
-----   Message from Quintella Reichertraci R Turner, MD sent at 02/01/2015  8:42 AM EDT ----- Creatinine is bumped - please have patient decrease lasix to once daily and recheck BMET in 1 week

## 2015-02-01 NOTE — H&P (View-Only) (Signed)
Cardiology Office Note Date:  01/10/2015   ID:  Jamie Howell, DOB 07/12/1950, MRN [id number]  PCP:  Londell MohPHARR,[name], MD  Cardiologist:  Tonny Bollmanooper, Tayia Stonesifer, MD    Chief Complaint  Patient presents with  . Palpitations   History of Present Illness: Jamie Howell is a 64 y.o. female who presents for follow-up evaluation. She has rheumatic heart disease with previous mechanical mitral valve replacement on chronic warfarin therapy. She underwent TAVR 09/28/2014. Patient has hepatic cirrhosis, chronic thrombocytopenia, and splenomegaly. Her platelet count has been ranging around 50,000. She has not had any recent bleeding complications.  Patient was seen by Dr. Johney FrameAllred last week. She was felt to have atypical atrial flutter versus ectopic atrial tachycardia. Cardioversion is recommended after 3 weeks of therapeutic anticoagulation. Her first therapeutic INR was 3.6 on October 5th. INR's are going to be followed at Dr Carolee RotaPharr's office, next lab draw tomorrow.   The patient complains of occasional palpitations. She has a tingling in her chest at times. She had edema in her legs over the weekend and took metolazone. She denies orthopnea, PND, or resting shortness of breath. Dyspnea with exertion is stable.  Past Medical History  Diagnosis Date  . HTN (hypertension)   . Thyrotoxicosis      without mention of goiter or other cause, without mention of thyrotoxic crisis or storm  . Hypothyroidism   . 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 Atypical atrial flutter (HCC)   . Subdural hematoma (HCC)     spontaneous  . Pulmonary HTN (HCC)     mild  . Aortic valve stenosis     moderate aortic valve stenosis with moderate aortic insufficiency, stable MVR, trivial PR, mild TR, severe LAE, grade II-III diastolic dysfunction,mild pulmonary HTN , EF 50% by echo 12/2012  . S/P mitral valve replacement with metallic valve 04/08/1991    31 mm St Jude bileaflet  mechanical valve placed for rheumatic mitral stenosis  . Persistent atrial fibrillation (HCC)   . Obesity (BMI 30-39.9) 07/08/2014  . Splenomegaly 07/14/2014  . Portal hypertension with esophageal varices (HCC) 07/14/2014  . Temporary low platelet count (HCC)   . 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 CHF (congestive heart failure) (HCC)     "I think so" (10/18/2014)  . Pneumonia ~ 2014; 05/2014  . Anemia   . History of blood transfusion 09/2014    "during hospitalization"  . Rheumatic fever     Past Surgical History  Procedure Laterality Date  . Mitral valve replacement  04/08/1991    31mm St Jude mechanical valve - Dr Andrey CampanileWilson  . Left and right heart catheterization with coronary angiogram N/A 06/28/2014    Procedure: LEFT AND RIGHT HEART CATHETERIZATION WITH CORONARY ANGIOGRAM;  Surgeon: Tonny BollmanMichael Ronell Boldin, MD; Normal coronaries, MVR OK, severe AS, elevated R heart pressures    . Subdural hematoma evacuation via craniotomy  2000    Dr Dutch QuintPoole  . Total abdominal hysterectomy  07-23-1994  . Tonsillectomy    . Multiple extractions with alveoloplasty N/A 08/05/2014    Procedure: Extraction of tooth #'s 3,4,5,7,8,9,10,11,12,13,17,20,21,22,23,24,25,26,27, 28,29, and 32 with alveoloplasty;  Surgeon: Charlynne Panderonald F Kulinski, DDS;  Location: [facility] OR;  Service: Oral Surgery;  Laterality: N/A;  . Cardiac catheterization      06/2014  . Transcatheter aortic valve replacement, transfemoral N/A 09/28/2014  Procedure: TRANSCATHETER AORTIC VALVE REPLACEMENT, TRANSFEMORAL;  Surgeon: Tonny Bollman, MD;  Location: [facility] OR;  Service: Open Heart Surgery;  Laterality: N/A;  . Tee without cardioversion N/A 09/28/2014    Procedure: TRANSESOPHAGEAL ECHOCARDIOGRAM (TEE);  Surgeon: Tonny Bollman, MD;  Location: [facility] OR;  Service: Open Heart Surgery;  Laterality: N/A;  . Cardiac valve replacement    . Laparoscopic  cholecystectomy  05-30-90  . Brain surgery    . Wound exploration Left 10/20/2014    Procedure: WOUND EXPLORATION;  Surgeon: [name], MD;  Location: [facility] OR;  Service: Thoracic;  Laterality: Left;  . Application of wound vac Left 10/20/2014    Procedure: APPLICATION OF WOUND VAC;  Surgeon: [name], MD;  Location: [facility] OR;  Service: Thoracic;  Laterality: Left;    Current Outpatient Prescriptions  Medication Sig Dispense Refill  . amiodarone (PACERONE) 200 MG tablet Take 1 tablet (200 mg total) by mouth daily. 30 tablet 1  . amLODipine (NORVASC) 2.5 MG tablet TAKE ONE TABLET BY MOUTH ONCE DAILY 30 tablet 0  . aspirin EC 81 MG tablet Take 1 tablet (81 mg total) by mouth daily.    . Calcium Carb-Cholecalciferol (CALCIUM 1000 + D PO) Take 1,000 mg by mouth daily with lunch.     . cyclobenzaprine (FLEXERIL) 10 MG tablet Take 5 mg by mouth at bedtime.     . ferrous sulfate 325 (65 FE) MG tablet Take 1 tablet (325 mg total) by mouth daily with breakfast. 30 tablet 5  . furosemide (LASIX) 40 MG tablet Take 1 tablet (40 mg total) by mouth 2 (two) times daily.    Marland Kitchen KLOR-CON M10 10 MEQ tablet TAKE ONE TABLET BY MOUTH ONCE DAILY 30 tablet 11  . levothyroxine (SYNTHROID, LEVOTHROID) 150 MCG tablet Take 150 mcg by mouth daily before breakfast.     . metolazone (ZAROXOLYN) 2.5 MG tablet Take 2.5 mg by mouth daily as needed (swelling).    . metoprolol succinate (TOPROL-XL) 50 MG 24 hr tablet TAKE 1 TABLET BY MOUTH ONCE DAILY WITH A MEAL OR IMMEDIATELY FOLLOWING A MEAL 30 tablet 0  . warfarin (COUMADIN) 5 MG tablet Take 0.5 tablets (2.5 mg total) by mouth at bedtime. Or as directed.     No current facility-administered medications for this visit.    Allergies:   Adhesive; Penicillins; and Sulfa antibiotics   Social History:  The patient  reports that she has never smoked. She has never used smokeless tobacco. She reports that she does not drink alcohol or use illicit drugs.   Family History:   The patient's family history includes CAD in her father and mother; Cirrhosis in her brother; Heart attack in her sister; Hypertension in her father.    ROS:  Please see the history of present illness.  Otherwise, review of systems is positivleg swelling, easy bruising, fatigue, irregular heartbeats.  All other systems are reviewed and negative.    PHYSICAL EXAM: VS:  BP 120/70 mmHg  Pulse 68  Ht  (1.499 m)  Wt 184 lb 6.4 oz (83.643 kg)  BMI 37.22 kg/m2 , BMI Body mass index is 37.22 kg/(m^2). GEN: Well nourished, well developed, in no acute distress HEENT: normal Neck: no JVD, no masses.  Cardiac: RRR with  normal mechanical S1 and  grade 2/6 systolic ejection murmur at the left sternal border Respiratory:  clear to auscultation bilaterally, normal work of breathing GI: soft, nontender, nondistended, + BS MS: no deformity or atrophy Ext: no pretibial edema, marked  varicosities bilaterally Skin: warm and dry, no rash Neuro:  Strength and sensation are intact Psych: euthymic mood, full affect  EKG:  EKG is not ordered today.  Recent Labs: 05/12/2014: Pro B Natriuretic peptide (BNP) 406.0* 09/24/2014: ALT 23 09/29/2014: Magnesium 2.4 10/23/2014: TSH 6.445* 10/24/2014: Hemoglobin 10.5*; Platelets 48* 01/04/2015: BUN 34*; Creat 1.26*; Potassium 3.0*; Sodium 135   Lipid Panel  No results found for: CHOL, TRIG, HDL, CHOLHDL, VLDL, LDLCALC, LDLDIRECT    Wt Readings from Last 3 Encounters:  01/10/15 184 lb 6.4 oz (83.643 kg)  01/05/15 186 lb 12.8 oz (84.732 kg)  01/04/15 185 lb 12.8 oz (84.278 kg)    Cardiac Studies Reviewed: 2D Echo 11/19/2014: Study Conclusions  - Left ventricle: The cavity size was normal. Wall thickness was increased in a pattern of mild LVH. Systolic function was mildly to moderately reduced. The estimated ejection fraction was in the range of 40% to 45%. There is hypokinesis of the inferoseptal myocardium. - Ventricular septum: Septal motion  showed paradox. - Aortic valve: TAVR valve, 26mm Edwards-Sapien functioning normally. There was no regurgitation. Mean gradient (S): 6 mm Hg. Peak gradient (S): 10 mm Hg. - Mitral valve: A mechanical prosthesis was present and functioning normally. Mean gradient (D): 4 mm Hg. Peak gradient (D): 10 mm Hg. - Left atrium: The atrium was moderately dilated. - Right ventricle: The cavity size was moderately dilated. Wall thickness was normal. - Right atrium: The atrium was severely dilated. - Tricuspid valve: There was moderate regurgitation. Mean gradient (D): 5 mm Hg. Peak gradient (D): 14 mm Hg. - Pulmonary arteries: Systolic pressure was mildly increased. PA peak pressure: 38 mm Hg (S).  Impressions:  - When compared to prior echocardiogram, EF is reduced.  ASSESSMENT AND PLAN: 1.  Aortic stenosis s/p TAVR: stable valve function on post-op echo, exam unchanged.   2. Mitral valve disease s/p mechanical mitral valve replacement: on chronic warfarin.  3. Chronic thrombocytopenia: stable on recent labs  4. Ectopic atrial tachycardia: plans for DCCV after 3 weeks of therapeutic anticoagulation. Reviewed risks and alternatives with the patient. Might consider whether to continue ASA post-cardioversion - indicated with mechanical MVR but concerned about bleeding risk with cirrhosis and thrombocytopenia.   Current medicines are reviewed with the patient today.  The patient does not have concerns regarding medicines.  Labs/ tests ordered today include:  No orders of the defined types were placed in this encounter.   Disposition:   FU as planned with Dr Shirl Harris, Tonny Bollman, MD  01/10/2015 8:33 AM    [facility] [address] [place], [place], [place]  [address] Phone: [phone]; Fax: [phone]

## 2015-02-01 NOTE — Anesthesia Procedure Notes (Signed)
Procedure Name: MAC Date/Time: 02/01/2015 1:04 PM Performed by: Little IshikawaMERCER, Arletta Lumadue L Pre-anesthesia Checklist: Patient identified, Emergency Drugs available, Suction available, Patient being monitored and Timeout performed Patient Re-evaluated:Patient Re-evaluated prior to inductionOxygen Delivery Method: Ambu bag Preoxygenation: Pre-oxygenation with 100% oxygen Intubation Type: IV induction Ventilation: Mask ventilation without difficulty

## 2015-02-01 NOTE — Discharge Instructions (Signed)
Electrical Cardioversion, Care After °Refer to this sheet in the next few weeks. These instructions provide you with information on caring for yourself after your procedure. Your health care provider may also give you more specific instructions. Your treatment has been planned according to current medical practices, but problems sometimes occur. Call your health care provider if you have any problems or questions after your procedure. °WHAT TO EXPECT AFTER THE PROCEDURE °After your procedure, it is typical to have the following sensations: °· Some redness on the skin where the shocks were delivered. If this is tender, a sunburn lotion or hydrocortisone cream may help. °· Possible return of an abnormal heart rhythm within hours or days after the procedure. °HOME CARE INSTRUCTIONS °· Take medicines only as directed by your health care provider. Be sure you understand how and when to take your medicine. °· Learn how to feel your pulse and check it often. °· Limit your activity for 48 hours after the procedure or as directed by your health care provider. °· Avoid or minimize caffeine and other stimulants as directed by your health care provider. °SEEK MEDICAL CARE IF: °· You feel like your heart is beating too fast or your pulse is not regular. °· You have any questions about your medicines. °· You have bleeding that will not stop. °SEEK IMMEDIATE MEDICAL CARE IF: °· You are dizzy or feel faint. °· It is hard to breathe or you feel short of breath. °· There is a change in discomfort in your chest. °· Your speech is slurred or you have trouble moving an arm or leg on one side of your body. °· You get a serious muscle cramp that does not go away. °· Your fingers or toes turn cold or blue. °  °This information is not intended to replace advice given to you by your health care provider. Make sure you discuss any questions you have with your health care provider. °  °Document Released: 01/07/2013 Document Revised: 04/09/2014  Document Reviewed: 01/07/2013 °Elsevier Interactive Patient Education ©2016 [employer] ° °

## 2015-02-01 NOTE — Telephone Encounter (Signed)
New message ° ° ° ° °Returning a call to the nurse °

## 2015-02-01 NOTE — Anesthesia Postprocedure Evaluation (Signed)
  Anesthesia Post-op Note  Patient: Jamie Howell  Procedure(s) Performed: Procedure(s) (LRB): CARDIOVERSION (N/A)  Patient Location: PACU  Anesthesia Type: MAC  Level of Consciousness: awake and alert   Airway and Oxygen Therapy: Patient Spontanous Breathing  Post-op Pain: mild  Post-op Assessment: Post-op Vital signs reviewed, Patient's Cardiovascular Status Stable, Respiratory Function Stable, Patent Airway and No signs of Nausea or vomiting  Last Vitals:  Filed Vitals:   02/01/15 1210  BP: 129/65  Temp: 36.6 C  Resp: 21    Post-op Vital Signs: stable   Complications: No apparent anesthesia complications

## 2015-02-01 NOTE — Interval H&P Note (Signed)
History and Physical Interval Note:  02/01/2015 12:56 PM  Danielle DessIrene C Anne  has presented today for surgery, with the diagnosis of aflutter  The various methods of treatment have been discussed with the patient and family. After consideration of risks, benefits and other options for treatment, the patient has consented to  Procedure(s): CARDIOVERSION (N/A) as a surgical intervention .  The patient's history has been reviewed, patient examined, no change in status, stable for surgery.  I have reviewed the patient's chart and labs.  Questions were answered to the patient's satisfaction.     TURNER,TRACI R

## 2015-02-02 ENCOUNTER — Encounter (HOSPITAL_COMMUNITY): Payer: Self-pay | Admitting: Cardiology

## 2015-02-02 NOTE — Telephone Encounter (Signed)
Patient st she will decrease back to 20 meq once daily since lasix was also decreased. Patient will still take an extra K on days she takes metolazone.  BMET rescheduled for this Friday. Patient agrees with treatment plan.

## 2015-02-02 NOTE — Telephone Encounter (Signed)
20 meq TID.

## 2015-02-02 NOTE — Telephone Encounter (Signed)
How much is she taking now?

## 2015-02-02 NOTE — Telephone Encounter (Signed)
Decrease back to 20meq BID and recheck BMET on Friday

## 2015-02-04 ENCOUNTER — Other Ambulatory Visit (INDEPENDENT_AMBULATORY_CARE_PROVIDER_SITE_OTHER): Payer: Commercial Managed Care - HMO | Admitting: *Deleted

## 2015-02-04 DIAGNOSIS — I5032 Chronic diastolic (congestive) heart failure: Secondary | ICD-10-CM

## 2015-02-04 LAB — BASIC METABOLIC PANEL
BUN: 50 mg/dL — ABNORMAL HIGH (ref 7–25)
CHLORIDE: 95 mmol/L — AB (ref 98–110)
CO2: 32 mmol/L — AB (ref 20–31)
Calcium: 9 mg/dL (ref 8.6–10.4)
Creat: 1.5 mg/dL — ABNORMAL HIGH (ref 0.50–0.99)
Glucose, Bld: 190 mg/dL — ABNORMAL HIGH (ref 65–99)
Potassium: 3.3 mmol/L — ABNORMAL LOW (ref 3.5–5.3)
SODIUM: 135 mmol/L (ref 135–146)

## 2015-02-08 ENCOUNTER — Telehealth: Payer: Self-pay | Admitting: Cardiology

## 2015-02-08 ENCOUNTER — Other Ambulatory Visit: Payer: Commercial Managed Care - HMO

## 2015-02-08 DIAGNOSIS — I5032 Chronic diastolic (congestive) heart failure: Secondary | ICD-10-CM

## 2015-02-08 MED ORDER — POTASSIUM CHLORIDE CRYS ER 20 MEQ PO TBCR: 40.0000 meq | EXTENDED_RELEASE_TABLET | Freq: Every day | ORAL | Status: DC

## 2015-02-08 NOTE — Telephone Encounter (Signed)
New message ° ° ° ° °Returning a call to the nurse to get lab results °

## 2015-02-08 NOTE — Telephone Encounter (Signed)
-----   Message from Quintella Reichertraci R Turner, MD sent at 02/06/2015  7:55 PM EST ----- Please have patient take Kdur 40 meq BID on 11/7 then decrease to 40meq daily starting 11/8 and check BMET on Friday 11/11.  Please find out how often she is taking Metalozone

## 2015-02-08 NOTE — Telephone Encounter (Signed)
Instructed patient to take Kdur 40 meq BID today and decrease to 40 meq daily tomorrow and thereafter. BMET scheduled for next Monday as patient cannot come Friday. Patient agrees with treatment plan.  Patient takes her metolazone twice weekly. She st she usually takes it "closer to the weekends" so she won't have to use the bathroom repeatedly at work.

## 2015-02-14 ENCOUNTER — Other Ambulatory Visit (INDEPENDENT_AMBULATORY_CARE_PROVIDER_SITE_OTHER): Payer: Commercial Managed Care - HMO | Admitting: *Deleted

## 2015-02-14 DIAGNOSIS — I5032 Chronic diastolic (congestive) heart failure: Secondary | ICD-10-CM | POA: Diagnosis not present

## 2015-02-14 LAB — BASIC METABOLIC PANEL
BUN: 31 mg/dL — ABNORMAL HIGH (ref 7–25)
CO2: 30 mmol/L (ref 20–31)
CREATININE: 1.37 mg/dL — AB (ref 0.50–0.99)
Calcium: 9.4 mg/dL (ref 8.6–10.4)
Chloride: 94 mmol/L — ABNORMAL LOW (ref 98–110)
Glucose, Bld: 202 mg/dL — ABNORMAL HIGH (ref 65–99)
Potassium: 3.2 mmol/L — ABNORMAL LOW (ref 3.5–5.3)
Sodium: 138 mmol/L (ref 135–146)

## 2015-02-18 ENCOUNTER — Telehealth: Payer: Self-pay | Admitting: Cardiology

## 2015-02-18 ENCOUNTER — Ambulatory Visit: Payer: Self-pay | Admitting: Cardiology

## 2015-02-18 DIAGNOSIS — I10 Essential (primary) hypertension: Secondary | ICD-10-CM

## 2015-02-18 NOTE — Telephone Encounter (Signed)
-----   Message from Quintella Reichertraci R Turner, MD sent at 02/16/2015  2:33 PM EST ----- Increase Kdur to 40 meq BID and recheck BMET in 1 week

## 2015-02-18 NOTE — Telephone Encounter (Signed)
Informed patient of results and verbal understanding expressed.  Patient st she was not taking K as directed. She was only taking 20 meq daily. When she received new K Rx 3 days ago, she started taking 40 meq daily. Instructed patient to continue K 40 meq and BMET will be redrawn 11/22. Patient agrees with treatment plan.

## 2015-02-18 NOTE — Telephone Encounter (Signed)
New Message    Pt calling returning rn call

## 2015-02-22 ENCOUNTER — Other Ambulatory Visit: Payer: Self-pay | Admitting: Cardiovascular Disease

## 2015-02-22 ENCOUNTER — Other Ambulatory Visit (INDEPENDENT_AMBULATORY_CARE_PROVIDER_SITE_OTHER): Payer: Commercial Managed Care - HMO | Admitting: *Deleted

## 2015-02-22 DIAGNOSIS — I10 Essential (primary) hypertension: Secondary | ICD-10-CM | POA: Diagnosis not present

## 2015-02-22 LAB — BASIC METABOLIC PANEL
BUN: 35 mg/dL — ABNORMAL HIGH (ref 7–25)
CHLORIDE: 97 mmol/L — AB (ref 98–110)
CO2: 28 mmol/L (ref 20–31)
CREATININE: 1.45 mg/dL — AB (ref 0.50–0.99)
Calcium: 8.8 mg/dL (ref 8.6–10.4)
GLUCOSE: 178 mg/dL — AB (ref 65–99)
Potassium: 3.1 mmol/L — ABNORMAL LOW (ref 3.5–5.3)
Sodium: 136 mmol/L (ref 135–146)

## 2015-02-25 ENCOUNTER — Ambulatory Visit: Payer: Self-pay | Admitting: Cardiology

## 2015-02-28 ENCOUNTER — Telehealth: Payer: Self-pay | Admitting: Cardiology

## 2015-02-28 DIAGNOSIS — I10 Essential (primary) hypertension: Secondary | ICD-10-CM

## 2015-02-28 MED ORDER — POTASSIUM CHLORIDE CRYS ER 20 MEQ PO TBCR: 40.0000 meq | EXTENDED_RELEASE_TABLET | Freq: Two times a day (BID) | ORAL | Status: DC

## 2015-02-28 NOTE — Telephone Encounter (Signed)
-----   Message from Quintella Reichertraci R Turner, MD sent at 02/23/2015 10:41 AM EST ----- Please have patient increase Kdur to 40 meq BID and repeat BMET on Monday 11/28.  Take an extra 40meq on days she takes Metolazone

## 2015-02-28 NOTE — Telephone Encounter (Signed)
New message ° °Patient returning nurse called.   °

## 2015-02-28 NOTE — Telephone Encounter (Signed)
Informed patient of results and verbal understanding expressed.  Instructed patient to INCREASE KDUR to 40 meq BID. Patient understands to take an extra 40 meq on days she takes Metolazone. BMET scheduled for Tuesday, 12/6 as patient cannot come in before. Patient agrees with treatment plan.

## 2015-03-08 ENCOUNTER — Other Ambulatory Visit (INDEPENDENT_AMBULATORY_CARE_PROVIDER_SITE_OTHER): Payer: Commercial Managed Care - HMO | Admitting: *Deleted

## 2015-03-08 DIAGNOSIS — I10 Essential (primary) hypertension: Secondary | ICD-10-CM

## 2015-03-08 LAB — BASIC METABOLIC PANEL
BUN: 28 mg/dL — AB (ref 7–25)
CALCIUM: 8.8 mg/dL (ref 8.6–10.4)
CO2: 31 mmol/L (ref 20–31)
Chloride: 99 mmol/L (ref 98–110)
Creat: 1.2 mg/dL — ABNORMAL HIGH (ref 0.50–0.99)
GLUCOSE: 156 mg/dL — AB (ref 65–99)
Potassium: 3.4 mmol/L — ABNORMAL LOW (ref 3.5–5.3)
SODIUM: 138 mmol/L (ref 135–146)

## 2015-03-08 NOTE — Addendum Note (Signed)
Addended by: BOWDEN, ROBIN K on: 03/08/2015 07:45 AM   Modules accepted: Orders  

## 2015-03-11 ENCOUNTER — Encounter: Payer: Self-pay | Admitting: Cardiology

## 2015-03-11 NOTE — Telephone Encounter (Signed)
This encounter was created in error - please disregard.

## 2015-03-11 NOTE — Telephone Encounter (Signed)
New Message   Pt calling for rn, returning call

## 2015-03-16 ENCOUNTER — Encounter: Payer: Self-pay | Admitting: Cardiology

## 2015-03-16 ENCOUNTER — Ambulatory Visit (INDEPENDENT_AMBULATORY_CARE_PROVIDER_SITE_OTHER): Payer: Commercial Managed Care - HMO | Admitting: Cardiology

## 2015-03-16 VITALS — BP 138/72 | HR 70 | Ht 59.0 in | Wt 183.0 lb

## 2015-03-16 DIAGNOSIS — I10 Essential (primary) hypertension: Secondary | ICD-10-CM

## 2015-03-16 DIAGNOSIS — I272 Other secondary pulmonary hypertension: Secondary | ICD-10-CM

## 2015-03-16 DIAGNOSIS — I059 Rheumatic mitral valve disease, unspecified: Secondary | ICD-10-CM

## 2015-03-16 DIAGNOSIS — I350 Nonrheumatic aortic (valve) stenosis: Secondary | ICD-10-CM | POA: Diagnosis not present

## 2015-03-16 DIAGNOSIS — I5032 Chronic diastolic (congestive) heart failure: Secondary | ICD-10-CM

## 2015-03-16 LAB — CBC WITH DIFFERENTIAL/PLATELET
Basophils Absolute: 0 10*3/uL (ref 0.0–0.1)
Basophils Relative: 1 % (ref 0–1)
EOS ABS: 0.3 10*3/uL (ref 0.0–0.7)
Eosinophils Relative: 8 % — ABNORMAL HIGH (ref 0–5)
HCT: 34.5 % — ABNORMAL LOW (ref 36.0–46.0)
Hemoglobin: 11.7 g/dL — ABNORMAL LOW (ref 12.0–15.0)
LYMPHS ABS: 0.6 10*3/uL — AB (ref 0.7–4.0)
Lymphocytes Relative: 17 % (ref 12–46)
MCH: 32.5 pg (ref 26.0–34.0)
MCHC: 33.9 g/dL (ref 30.0–36.0)
MCV: 95.8 fL (ref 78.0–100.0)
MONOS PCT: 10 % (ref 3–12)
MPV: 11 fL (ref 8.6–12.4)
Monocytes Absolute: 0.3 10*3/uL (ref 0.1–1.0)
NEUTROS PCT: 64 % (ref 43–77)
Neutro Abs: 2.1 10*3/uL (ref 1.7–7.7)
PLATELETS: 54 10*3/uL — AB (ref 150–400)
RBC: 3.6 MIL/uL — ABNORMAL LOW (ref 3.87–5.11)
RDW: 15.7 % — ABNORMAL HIGH (ref 11.5–15.5)
WBC: 3.3 10*3/uL — ABNORMAL LOW (ref 4.0–10.5)

## 2015-03-16 LAB — BASIC METABOLIC PANEL
BUN: 37 mg/dL — AB (ref 7–25)
CHLORIDE: 100 mmol/L (ref 98–110)
CO2: 31 mmol/L (ref 20–31)
CREATININE: 1.2 mg/dL — AB (ref 0.50–0.99)
Calcium: 9.3 mg/dL (ref 8.6–10.4)
GLUCOSE: 172 mg/dL — AB (ref 65–99)
Potassium: 3.9 mmol/L (ref 3.5–5.3)
Sodium: 141 mmol/L (ref 135–146)

## 2015-03-16 NOTE — Patient Instructions (Signed)

## 2015-03-16 NOTE — Progress Notes (Signed)
Cardiology Office Note   Date:  03/16/2015   ID:  Jamie Dessrene C Kattner, DOB 12/26/1950, MRN [id number]  PCP:  Londell MohPHARR,[name], MD    Chief Complaint  Patient presents with  . Irregular Heart Beat  . Hypertension      History of Present Illness: Jamie Howell is a 64 y.o. female who presents for follow-up evaluation. She has rheumatic heart disease with previous mechanical mitral valve replacement on chronic warfarin therapy. She underwent TAVR 09/28/2014. Patient has hepatic cirrhosis, chronic thrombocytopenia, and splenomegaly. Her platelet count has been ranging around 50,000. She has not had any recent bleeding complications.   She was felt to have atypical atrial flutter versus ectopic atrial tachycardia. She underwent DCCV a month ago to NSR.    She denies chest pain, SOB, DOE, orthopnea, PND.  She occasionally has some mild LE edema. She denies any palpitations, dizziness or syncope.     Past Medical History  Diagnosis Date  . HTN (hypertension)   . Thyrotoxicosis      without mention of goiter or other cause, without mention of thyrotoxic crisis or storm  . Hypothyroidism   . 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 Atypical atrial flutter (HCC)   . Subdural hematoma (HCC)     spontaneous  . Pulmonary HTN (HCC)     mild  . Aortic valve stenosis     moderate aortic valve stenosis with moderate aortic insufficiency, stable MVR, trivial PR, mild TR, severe LAE, grade II-III diastolic dysfunction,mild pulmonary HTN , EF 50% by echo 12/2012  . 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 Paroxysmal atrial fibrillation (HCC)   . Obesity (BMI 30-39.9) 07/08/2014  . Splenomegaly 07/14/2014  . Portal hypertension with esophageal varices (HCC) 07/14/2014  . Temporary low platelet count (HCC)   . 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 CHF (congestive heart failure) (HCC)     "I think so" (10/18/2014)  . Pneumonia ~ 2014; 05/2014  . Anemia   . History of blood transfusion 09/2014    "during hospitalization"  . Rheumatic fever   . Paroxysmal atrial tachycardia (HCC)     s/p DCCV    Past Surgical History  Procedure Laterality Date  . Mitral valve replacement  04/08/1991    31mm St Jude mechanical valve - Dr Andrey CampanileWilson  . Left and right heart catheterization with coronary angiogram N/A 06/28/2014    Procedure: LEFT AND RIGHT HEART CATHETERIZATION WITH CORONARY ANGIOGRAM;  Surgeon: Tonny BollmanMichael Cooper, MD; Normal coronaries, MVR OK, severe AS, elevated R heart pressures    . Subdural hematoma evacuation via craniotomy  2000    Dr Dutch QuintPoole  . Total abdominal hysterectomy  07-23-1994  . Tonsillectomy    . Multiple extractions with alveoloplasty N/A 08/05/2014    Procedure: Extraction of tooth #'s 3,4,5,7,8,9,10,11,12,13,17,20,21,22,23,24,25,26,27, 28,29, and 32 with alveoloplasty;  Surgeon: Charlynne Panderonald F Kulinski, DDS;  Location: [facility] OR;  Service: Oral Surgery;  Laterality: N/A;  . Cardiac catheterization      06/2014  . Transcatheter aortic valve replacement, transfemoral N/A 09/28/2014    Procedure: TRANSCATHETER AORTIC VALVE REPLACEMENT, TRANSFEMORAL;  Surgeon: Tonny BollmanMichael Cooper, MD;  Location: [facility] OR;  Service: Open Heart Surgery;  Laterality: N/A;  . Tee without cardioversion N/A 09/28/2014    Procedure: TRANSESOPHAGEAL ECHOCARDIOGRAM (TEE);  Surgeon: Tonny Bollman, MD;  Location[facility];  Service: Open Heart Surgery;  Laterality: N/A;  . Cardiac valve replacement    . Laparoscopic cholecystectomy  05-30-90  . Brain surgery    . Wound exploration Left 10/20/2014    Procedure: WOUND EXPLORATION;  Surgeon: [name], MD;  Location: [facility] OR;  Service: Thoracic;  Laterality: Left;  . Application of wound vac Left  10/20/2014    Procedure: APPLICATION OF WOUND VAC;  Surgeon: [name], MD;  Location: [facility] OR;  Service: Thoracic;  Laterality: Left;  . Cardioversion N/A 02/01/2015    Procedure: CARDIOVERSION;  Surgeon: Quintella Reichert, MD;  Location: [facility] ENDOSCOPY;  Service: Cardiovascular;  Laterality: N/A;     Current Outpatient Prescriptions  Medication Sig Dispense Refill  . amiodarone (PACERONE) 200 MG tablet Take 1 tablet (200 mg total) by mouth daily. 30 tablet 1  . amLODipine (NORVASC) 2.5 MG tablet TAKE ONE TABLET BY MOUTH ONCE DAILY 30 tablet 11  . aspirin EC 81 MG tablet Take 1 tablet (81 mg total) by mouth daily.    . Calcium Carb-Cholecalciferol (CALCIUM 1000 + D PO) Take 1,000 mg by mouth daily with lunch.     . cyclobenzaprine (FLEXERIL) 10 MG tablet Take 5 mg by mouth at bedtime.     . ferrous sulfate 325 (65 FE) MG tablet Take 1 tablet (325 mg total) by mouth daily with breakfast. 30 tablet 5  . furosemide (LASIX) 40 MG tablet Take 1 tablet (40 mg total) by mouth daily. 30 tablet   . levothyroxine (SYNTHROID, LEVOTHROID) 150 MCG tablet Take 150 mcg by mouth daily before breakfast.     . metolazone (ZAROXOLYN) 2.5 MG tablet TAKE ONE TABLET BY MOUTH DAILY AS NEEDED FOR SWELLING 10 tablet 0  . metoprolol succinate (TOPROL-XL) 50 MG 24 hr tablet TAKE 1 TABLET BY MOUTH ONCE DAILY WITH A MEAL OR IMMEDIATELY FOLLOWING A MEAL 30 tablet 11  . potassium chloride SA (K-DUR,KLOR-CON) 20 MEQ tablet Take 2 tablets (40 mEq total) by mouth 2 (two) times daily. Take 2 extra tablets when you take the metolazone. 136 tablet 11  . warfarin (COUMADIN) 5 MG tablet Take 0.5 tablets (2.5 mg total) by mouth at bedtime. Or as directed.     No current facility-administered medications for this visit.    Allergies:   Adhesive; Penicillins; and Sulfa antibiotics    Social History:  The patient  reports that she has never smoked. She has never used smokeless tobacco. She reports that she does not drink alcohol or  use illicit drugs.   Family History:  The patient's family history includes CAD in her father and mother; Cirrhosis in her brother; Heart attack in her sister; Hypertension in her father.    ROS:  Please see the history of present illness.   Otherwise, review of systems are positive for none.   All other systems are reviewed and negative.    PHYSICAL EXAM: VS:  BP 138/72 mmHg  Pulse 70  Ht  (1.499 m)  Wt 183 lb (83.008 kg)  BMI 36.94 kg/m2 , BMI Body mass index is 36.94 kg/(m^2). GEN: Well nourished, well developed, in no acute distress HEENT: normal Neck: no JVD, carotid bruits, or masses Cardiac: RRR; no murmurs, rubs, or gallops,no edema  Respiratory:  clear to auscultation bilaterally, normal  work of breathing GI: soft, nontender, nondistended, + BS MS: no deformity or atrophy Skin: warm and dry, no rash Neuro:  Strength and sensation are intact Psych: euthymic mood, full affect   EKG:  EKG is not ordered today.    Recent Labs: 05/12/2014: Pro B Natriuretic peptide (BNP) 406.0* 09/24/2014: ALT 23 09/29/2014: Magnesium 2.4 10/23/2014: TSH 6.445* 01/31/2015: Platelets 69* 02/01/2015: Hemoglobin 14.6 03/08/2015: BUN 28*; Creat 1.20*; Potassium 3.4*; Sodium 138    Lipid Panel No results found for: CHOL, TRIG, HDL, CHOLHDL, VLDL, LDLCALC, LDLDIRECT    Wt Readings from Last 3 Encounters:  03/16/15 183 lb (83.008 kg)  02/01/15 176 lb (79.833 kg)  01/10/15 184 lb 6.4 oz (83.643 kg)    ASSESSMENT AND PLAN: 1. Aortic stenosis s/p TAVR: stable valve function on post-op echo, exam unchanged.   2. Mitral valve disease s/p mechanical mitral valve replacement: on chronic warfarin/ASA.  3. Chronic thrombocytopenia: stable on recent labs.  Check platelet count today.  4. Ectopic atrial tachycardia:s/p DCCV to NSR   5.  PAF maintaining NSR on warfarin, pacerone and BB  6.  Chronic diastolic CHF - appears euvolemic on exam.  Continue BB and Lasix as well as PRN  metolazone.  Check BMET today.     Current medicines are reviewed at length with the patient today.  The patient does not have concerns regarding medicines.  The following changes have been made:  no change  Labs/ tests ordered today: See above Assessment and Plan No orders of the defined types were placed in this encounter.     Disposition:   FU with me in 6 months  Signed, Quintella Reichert, MD  03/16/2015 8:46 AM    [facility] [address] [place], [place], [place]  [address] Phone: [phone]; Fax: [phone]

## 2015-04-01 ENCOUNTER — Other Ambulatory Visit: Payer: Self-pay | Admitting: Physician Assistant

## 2015-04-01 ENCOUNTER — Other Ambulatory Visit: Payer: Self-pay | Admitting: Cardiovascular Disease

## 2015-04-01 NOTE — Telephone Encounter (Signed)
Rx request sent to pharmacy.  

## 2015-05-02 ENCOUNTER — Telehealth: Payer: Self-pay | Admitting: Cardiovascular Disease

## 2015-05-02 ENCOUNTER — Other Ambulatory Visit: Payer: Self-pay | Admitting: Gastroenterology

## 2015-05-02 DIAGNOSIS — R945 Abnormal results of liver function studies: Principal | ICD-10-CM

## 2015-05-02 DIAGNOSIS — R7989 Other specified abnormal findings of blood chemistry: Secondary | ICD-10-CM

## 2015-05-02 NOTE — Telephone Encounter (Signed)
Patient st she went to Dr. Carolee Rota office the other day. He told her to STOP METOLAZONE and HOLD AMIODARONE until Dr. Mayford Knife tells her to resume.  According to the patient, lab work "looked bad and was near toxic." Patient asymptomatic at this time. Dr. Carolee Rota OV note and lab work requested.  Patient understands she will be called back with further instruction after review.

## 2015-05-02 NOTE — Telephone Encounter (Signed)
Follow Up  Pt c/o medication issue:  1. Name of Medication: amLODipine (NORVASC) 2.5 MG tablet  4. What is your medication issue? Pt states that this medication doesn't work well with her body. Request a call back to discuss the alternative

## 2015-05-03 NOTE — Telephone Encounter (Signed)
Medical records received and placed in Dr. Norris Cross folder for review.

## 2015-05-04 ENCOUNTER — Encounter: Payer: Self-pay | Admitting: Cardiology

## 2015-05-11 ENCOUNTER — Telehealth: Payer: Self-pay | Admitting: Cardiology

## 2015-05-11 NOTE — Telephone Encounter (Signed)
Per Dr. Mayford Knife, instructed patient to continue amiodarone and STOP metolazone. Informed patient of Dr. Norris Cross recommendation for GI referral.  Patient st she has already been referred to GI. She has a liver scan coming up soon. Instructed patient to have GI send results to Dr. Mayford Knife when complete. Patient agrees with treatment plan.

## 2015-05-11 NOTE — Telephone Encounter (Signed)
New message      PCP stopped amiodarone last week because he thought it was toxic.  They drew labs and the lab work was fine. Patient need to know if she should restart medication?

## 2015-05-24 ENCOUNTER — Ambulatory Visit
Admission: RE | Admit: 2015-05-24 | Discharge: 2015-05-24 | Disposition: A | Discharge status: Home / Self Care | Payer: Commercial Managed Care - HMO | Source: Ambulatory Visit | Attending: Gastroenterology | Admitting: Gastroenterology

## 2015-05-24 DIAGNOSIS — R7989 Other specified abnormal findings of blood chemistry: Secondary | ICD-10-CM

## 2015-05-24 DIAGNOSIS — R945 Abnormal results of liver function studies: Principal | ICD-10-CM

## 2015-07-05 ENCOUNTER — Other Ambulatory Visit: Payer: Self-pay | Admitting: Cardiovascular Disease

## 2015-07-05 DIAGNOSIS — Z952 Presence of prosthetic heart valve: Secondary | ICD-10-CM

## 2015-07-08 ENCOUNTER — Ambulatory Visit (INDEPENDENT_AMBULATORY_CARE_PROVIDER_SITE_OTHER): Payer: Commercial Managed Care - HMO | Admitting: Cardiovascular Disease

## 2015-07-08 ENCOUNTER — Encounter: Payer: Self-pay | Admitting: Cardiovascular Disease

## 2015-07-08 ENCOUNTER — Ambulatory Visit (HOSPITAL_COMMUNITY): Payer: Commercial Managed Care - HMO | Attending: Internal Medicine

## 2015-07-08 VITALS — BP 120/60 | HR 60 | Ht 59.0 in | Wt 196.4 lb

## 2015-07-08 DIAGNOSIS — I00 Rheumatic fever without heart involvement: Secondary | ICD-10-CM | POA: Diagnosis not present

## 2015-07-08 DIAGNOSIS — Z952 Presence of prosthetic heart valve: Secondary | ICD-10-CM

## 2015-07-08 DIAGNOSIS — Z954 Presence of other heart-valve replacement: Secondary | ICD-10-CM

## 2015-07-08 DIAGNOSIS — I340 Nonrheumatic mitral (valve) insufficiency: Secondary | ICD-10-CM | POA: Diagnosis not present

## 2015-07-08 DIAGNOSIS — I071 Rheumatic tricuspid insufficiency: Secondary | ICD-10-CM | POA: Insufficient documentation

## 2015-07-08 DIAGNOSIS — I350 Nonrheumatic aortic (valve) stenosis: Secondary | ICD-10-CM

## 2015-07-08 DIAGNOSIS — I272 Other secondary pulmonary hypertension: Secondary | ICD-10-CM | POA: Insufficient documentation

## 2015-07-08 DIAGNOSIS — I5043 Acute on chronic combined systolic (congestive) and diastolic (congestive) heart failure: Secondary | ICD-10-CM | POA: Diagnosis not present

## 2015-07-08 DIAGNOSIS — I5033 Acute on chronic diastolic (congestive) heart failure: Secondary | ICD-10-CM

## 2015-07-08 DIAGNOSIS — I517 Cardiomegaly: Secondary | ICD-10-CM | POA: Insufficient documentation

## 2015-07-08 MED ORDER — TORSEMIDE 20 MG PO TABS: 60.0000 mg | ORAL_TABLET | Freq: Every day | ORAL | Status: DC

## 2015-07-08 MED ORDER — POTASSIUM CHLORIDE CRYS ER 20 MEQ PO TBCR: 40.0000 meq | EXTENDED_RELEASE_TABLET | Freq: Two times a day (BID) | ORAL | Status: DC

## 2015-07-08 MED ORDER — METOLAZONE 2.5 MG PO TABS: ORAL_TABLET | ORAL | Status: DC

## 2015-07-08 NOTE — Progress Notes (Signed)
Cardiology Office Note Date:  07/08/2015   ID:  HOLLIS OH, DOB Mar 10, 1951, MRN [id number]  PCP:  Londell Moh, MD  Cardiologist:  Tonny Bollman, MD    Chief Complaint  Patient presents with  . severe aortic stenosis    c/o sob, le edema (legs feel very heavy). denies cp  . Hypertension     History of Present Illness: Jamie Howell is a 65 y.o. female who presents for TAVR follow-up. She has a history of rheumatic valvular disease and underwent mechanical MVR remotely and is maintained on chronic warfarin. She underwent TAVR 09/28/2014 with a 26 mm Sapien 3 THV via a left transfemoral approach. Her post-operative course was complicated by a left groin wound infection requiring surgical debridement and wound vac. Other problems include hepatic cirrhosis, chronic thrombocytopenia, atrial flutter, and splenomegaly.   Today, she complains of leg swelling, decreased exercise tolerance, progressive shortness of breath, and orthopnea. Denies PND or chest pain. Symptoms have been gradually worsening over the last 3-4 weeks. Her metolazone was stopped because of worsening renal function. No changes in diet. No fever, chills, or constitutional symptoms. No other complaints.    Past Medical History  Diagnosis Date  . HTN (hypertension)   . Thyrotoxicosis      without mention of goiter or other cause, without mention of thyrotoxic crisis or storm  . Hypothyroidism   . 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 Atypical atrial flutter (HCC)   . Subdural hematoma (HCC)     spontaneous  . Pulmonary HTN (HCC)     mild  . Aortic valve stenosis     moderate aortic valve stenosis with moderate aortic insufficiency, stable MVR, trivial PR, mild TR, severe LAE, grade II-III diastolic dysfunction,mild pulmonary HTN , EF 50% by echo 12/2012  . S/P mitral valve replacement with metallic valve 04/08/1991    31 mm St Jude bileaflet mechanical valve placed  for rheumatic mitral stenosis  . Paroxysmal atrial fibrillation (HCC)   . Obesity (BMI 30-39.9) 07/08/2014  . Splenomegaly 07/14/2014  . Portal hypertension with esophageal varices (HCC) 07/14/2014  . Temporary low platelet count (HCC)   . 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 CHF (congestive heart failure) (HCC)     "I think so" (10/18/2014)  . Pneumonia ~ 2014; 05/2014  . Anemia   . History of blood transfusion 09/2014    "during hospitalization"  . Rheumatic fever   . Paroxysmal atrial tachycardia (HCC)     s/p DCCV    Past Surgical History  Procedure Laterality Date  . Mitral valve replacement  04/08/1991    31mm St Jude mechanical valve - Dr Andrey Campanile  . Left and right heart catheterization with coronary angiogram N/A 06/28/2014    Procedure: LEFT AND RIGHT HEART CATHETERIZATION WITH CORONARY ANGIOGRAM;  Surgeon: Tonny Bollman, MD; Normal coronaries, MVR OK, severe AS, elevated R heart pressures    . Subdural hematoma evacuation via craniotomy  2000    Dr Dutch Quint  . Total abdominal hysterectomy  07-23-1994  . Tonsillectomy    . Multiple extractions with alveoloplasty N/A 08/05/2014    Procedure: Extraction of tooth #'s 3,4,5,7,8,9,10,11,12,13,17,20,21,22,23,24,25,26,27, 28,29, and 32 with alveoloplasty;  Surgeon: Charlynne Pander, DDS;  Location: [facility] OR;  Service: Oral Surgery;  Laterality: N/A;  . Cardiac catheterization  06/2014  . Transcatheter aortic valve replacement, transfemoral N/A 09/28/2014    Procedure: TRANSCATHETER AORTIC VALVE REPLACEMENT, TRANSFEMORAL;  Surgeon: Tonny BollmanMichael Hong Timm, MD;  [facility];  Service: Open Heart Surgery;  Laterality: N/A;  . Tee without cardioversion N/A 09/28/2014    Procedure: TRANSESOPHAGEAL ECHOCARDIOGRAM (TEE);  Surgeon: Tonny BollmanMichael Taheem Fricke, MD;  [facility];  Service: Open Heart Surgery;  Laterality: N/A;  . Cardiac valve  replacement    . Laparoscopic cholecystectomy  05-30-90  . Brain surgery    . Wound exploration Left 10/20/2014    Procedure: WOUND EXPLORATION;  Surgeon: Purcell Nailslarence H Owen, MD;  [facility];  Service: Thoracic;  Laterality: Left;  . Application of wound vac Left 10/20/2014    Procedure: APPLICATION OF WOUND VAC;  Surgeon: Purcell Nailslarence H Owen, MD;  [facility];  Service: Thoracic;  Laterality: Left;  . Cardioversion N/A 02/01/2015    Procedure: CARDIOVERSION;  Surgeon: Quintella Reichertraci R Turner, MD;  Location: [facility] ENDOSCOPY;  Service: Cardiovascular;  Laterality: N/A;    Current Outpatient Prescriptions  Medication Sig Dispense Refill  . amiodarone (PACERONE) 200 MG tablet Take 1 tablet (200 mg total) by mouth daily. 30 tablet 1  . amLODipine (NORVASC) 2.5 MG tablet TAKE ONE TABLET BY MOUTH ONCE DAILY 30 tablet 11  . aspirin EC 81 MG tablet Take 1 tablet (81 mg total) by mouth daily.    . Calcium Carb-Cholecalciferol (CALCIUM 1000 + D PO) Take 1,000 mg by mouth daily with lunch.     . cyclobenzaprine (FLEXERIL) 10 MG tablet Take 5 mg by mouth at bedtime.     . ferrous sulfate 325 (65 FE) MG tablet TAKE ONE TABLET BY MOUTH ONCE DAILY WITH BREAKFAST 30 tablet 6  . furosemide (LASIX) 40 MG tablet Take 60 mg by mouth. Take 1.5 tablets by mouth once daily    . levothyroxine (SYNTHROID, LEVOTHROID) 150 MCG tablet Take 150 mcg by mouth daily before breakfast.     . metoprolol succinate (TOPROL-XL) 50 MG 24 hr tablet TAKE 1 TABLET BY MOUTH ONCE DAILY WITH A MEAL OR IMMEDIATELY FOLLOWING A MEAL 30 tablet 11  . potassium chloride SA (K-DUR,KLOR-CON) 20 MEQ tablet Take 20 mEq by mouth daily.    Marland Kitchen. warfarin (COUMADIN) 5 MG tablet Take 0.5 tablets (2.5 mg total) by mouth at bedtime. Or as directed.     No current facility-administered medications for this visit.    Allergies:   Adhesive; Penicillins; and Sulfa antibiotics   Social History:  The patient  reports that she has never smoked. She has never used  smokeless tobacco. She reports that she does not drink alcohol or use illicit drugs.   Family History:  The patient's  family history includes CAD in her father and mother; Cirrhosis in her brother; Heart attack in her sister; Hypertension in her father.    ROS:  Please see the history of present illness.  Otherwise, review of systems is positive for weight gain, leg swelling, easy bruising, wheezing.  All other systems are reviewed and negative.    PHYSICAL EXAM: VS:  BP 120/60 mmHg  Pulse 60  Ht 4\' 11"  (1.499 m)  Wt 196 lb 6.4 oz (89.086 kg)  BMI 39.65 kg/m2 , BMI Body mass index is 39.65 kg/(m^2). GEN: Well nourished, well developed, in no acute distress HEENT: normal Neck: JVP 14 cm, no masses. No carotid bruits Cardiac: RRR with normal mechanical S1, 2/6 systolic murmur at the RUSB  Respiratory:  clear to auscultation bilaterally, normal work of breathing GI: soft, nontender, nondistended, + BS MS: no deformity or atrophy Ext: 3+ pretibial edema to the knees bilaterally Skin: warm and dry, no rash Neuro:  Strength and sensation are intact Psych: euthymic mood, full affect  EKG:  EKG is ordered today. The ekg ordered today shows sinus rhythm with first degree AV block, prolonged QT, nonspecific ST abnormality  Recent Labs: 09/24/2014: ALT 23 09/29/2014: Magnesium 2.4 10/23/2014: TSH 6.445* 03/16/2015: BUN 37*; Creat 1.20*; Hemoglobin 11.7*; Platelets 54*; Potassium 3.9; Sodium 141   Lipid Panel  No results found for: CHOL, TRIG, HDL, CHOLHDL, VLDL, LDLCALC, LDLDIRECT    Wt Readings from Last 3 Encounters:  07/08/15 196 lb 6.4 oz (89.086 kg)  03/16/15 183 lb (83.008 kg)  02/01/15 176 lb (79.833 kg)     Cardiac Studies Reviewed: Echo: Study Conclusions  - Left ventricle: LVEF is approximately 45% with septal, posterior  and inferior hypokinesis. The cavity size was normal. Wall  thickness was normal. - Aortic valve: AV prosthesis is difficult to see  well Peak nad  mean gradients through the valve are 18 and 11 mm Hg  respectively. - Mitral valve: MV prosthesiss is difficult to see well Peak and  mean gradients through the valve arer 12 and 4 mm Hg  respectively. There was mild regurgitation. - Left atrium: The atrium was severely dilated. - Right ventricle: The cavity size was mildly dilated. - Right atrium: The atrium was severely dilated. - Tricuspid valve: There was moderate regurgitation. - Pulmonary arteries: PA peak pressure: 71 mm Hg (S).  ASSESSMENT AND PLAN: 1.  Aortic Valve Disease s/p TAVR: aortic prosthesis functioning normally.  I personally reviewed her echo today and gradients are mean 11/peak 18 mmHg. There is no AI.  2. Acute on chronic systolic and diastolic CHF: the patient has symptoms and clinical exam findings c/w volume overload and acute CHF. She has NYHA III sx's. Echo review shows signs of RV volume overload and pulmonary HTN. Recommended:  Stop lasix  Start torsemide 60 mg daily  Start metolazone 2.5 mg twice/week - first dose tomorrow morning prior to torsemide   Increase KDur 40 meq twice daily  Limit sodium  Check BMET/BNP today and repeat 1-2 weeks  APP follow-up 3-4 weeks  Keep scheduled appt with Dr Mayford Knife in 2 months  Current medicines are reviewed with the patient today.  The patient does not have concerns regarding medicines.  Labs/ tests ordered today include:  No orders of the defined types were placed in this encounter.   Disposition:   FU prn  Signed, Tonny Bollman, MD  07/08/2015 4:08 PM    [facility] [address] [place], [place], [place]  [address] Phone: [phone]; Fax: [phone]

## 2015-07-08 NOTE — Patient Instructions (Signed)
Medication Instructions:  Your physician has recommended you make the following change in your medication:  1. STOP Furosemide (Lasix) 2. START Torsemide 20mg  take three tablets (total 60mg ) by mouth once a day 3. START Metolazone 2.5mg  take one tablet by mouth 30 minutes prior to your Torsemide dosage on Saturday and Wednesday 4. INCREASE Potassium Chloride to 20mEq take two tablets by mouth twice a day  Labwork: Your physician recommends that you have lab work today: BMP and BNP  Your physician recommends that you return for lab work in: 2 WEEKS (BMP and BNP)  Testing/Procedures: No new orders.   Follow-Up: Your physician recommends that you schedule a follow-up appointment in: 2 WEEKS with PA/NP   Any Other Special Instructions Will Be Listed Below (If Applicable).     If you need a refill on your cardiac medications before your next appointment, please call your pharmacy.

## 2015-07-09 LAB — BASIC METABOLIC PANEL
BUN: 25 mg/dL (ref 7–25)
CALCIUM: 8.7 mg/dL (ref 8.6–10.4)
CO2: 28 mmol/L (ref 20–31)
CREATININE: 1.22 mg/dL — AB (ref 0.50–0.99)
Chloride: 103 mmol/L (ref 98–110)
Glucose, Bld: 145 mg/dL — ABNORMAL HIGH (ref 65–99)
Potassium: 4 mmol/L (ref 3.5–5.3)
Sodium: 141 mmol/L (ref 135–146)

## 2015-07-09 LAB — BRAIN NATRIURETIC PEPTIDE: Brain Natriuretic Peptide: 388.9 pg/mL — ABNORMAL HIGH (ref <100)

## 2015-07-13 ENCOUNTER — Encounter: Payer: Self-pay | Admitting: Cardiovascular Disease

## 2015-07-13 NOTE — Telephone Encounter (Signed)
Follow Up: ° ° ° ° °Returning call from yesterday. °

## 2015-07-13 NOTE — Telephone Encounter (Signed)
This encounter was created in error - please disregard.

## 2015-07-24 NOTE — Progress Notes (Signed)
Cardiology Office Note:    Date:  07/25/2015   ID:  Jamie Howell, DOB 05-08-1950, MRN [id number]  PCP:  Londell Moh, MD  Cardiologist:  Dr. Armanda Magic   Electrophysiologist:  Dr. Hillis Range  TAVR: Dr. Tonny Bollman   Referring MD: Merri Brunette, MD   Chief Complaint  Patient presents with  . Congestive Heart Failure    follow up    History of Present Illness:     Jamie Howell is a 65 y.o. female with a hx of Rheumatic heart disease status post remote prior mechanical MVR and subsequent TAVR in 6/16, warfarin anticoagulation, hepatic cirrhosis, chronic thrombocytopenia, atrial flutter status post DCCV 11/16, HTN, combined systolic and diastolic HF.   She was seen in follow-up by Dr. Excell Seltzer 07/08/15.  She was felt to be volume overloaded. She was taken off of Lasix and placed on torsemide QD as well as metolazone twice a week. She returns for follow-up.   She is here alone.  She notes improved DOE and edema.  However, she had significant SEs to the Metolazone. It caused significant dizziness and nausea.  She took 2 doses and stopped. She also felt worsening dizziness on Torsemide 60 mg QD.  She decreased this to 40 mg QD.  She has felt better since.  She denies chest pain, syncope, orthopnea, PND, edema.  She notes improved DOE.  She is NYHA 2b.     Past Medical History  Diagnosis Date  . HTN (hypertension)   . Thyrotoxicosis      without mention of goiter or other cause, without mention of thyrotoxic crisis or storm  . Hypothyroidism   . 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 Atypical atrial flutter (HCC)   . Subdural hematoma (HCC)     spontaneous  . Pulmonary HTN (HCC)     mild  . Aortic valve stenosis     moderate aortic valve stenosis with moderate aortic insufficiency, stable MVR, trivial PR, mild TR, severe LAE, grade II-III diastolic dysfunction,mild pulmonary HTN , EF 50% by echo 12/2012  . S/P mitral valve  replacement with metallic valve 04/08/1991    31 mm St Jude bileaflet mechanical valve placed for rheumatic mitral stenosis  . Paroxysmal atrial fibrillation (HCC)   . Obesity (BMI 30-39.9) 07/08/2014  . Splenomegaly 07/14/2014  . Portal hypertension with esophageal varices (HCC) 07/14/2014  . Temporary low platelet count (HCC)   . 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 CHF (congestive heart failure) (HCC)     "I think so" (10/18/2014)  . Pneumonia ~ 2014; 05/2014  . Anemia   . History of blood transfusion 09/2014    "during hospitalization"  . Rheumatic fever   . Paroxysmal atrial tachycardia (HCC)     s/p DCCV    Past Surgical History  Procedure Laterality Date  . Mitral valve replacement  04/08/1991    31mm St Jude mechanical valve - Dr Andrey Campanile  . Left and right heart catheterization with coronary angiogram N/A 06/28/2014    Procedure: LEFT AND RIGHT HEART CATHETERIZATION WITH CORONARY ANGIOGRAM;  Surgeon: Tonny Bollman, MD; Normal coronaries, MVR OK, severe AS, elevated R heart pressures    . Subdural hematoma evacuation via craniotomy  2000    Dr Dutch Quint  . Total abdominal hysterectomy  07-23-1994  . Tonsillectomy    .  Multiple extractions with alveoloplasty N/A 08/05/2014    Procedure: Extraction of tooth #'s 3,4,5,7,8,9,10,11,12,13,17,20,21,22,23,24,25,26,27, 28,29, and 32 with alveoloplasty;  Surgeon: Charlynne Panderonald F Kulinski, DDS;  Location: [facility] OR;  Service: Oral Surgery;  Laterality: N/A;  . Cardiac catheterization      06/2014  . Transcatheter aortic valve replacement, transfemoral N/A 09/28/2014    Procedure: TRANSCATHETER AORTIC VALVE REPLACEMENT, TRANSFEMORAL;  Surgeon: Tonny BollmanMichael Cooper, MD;  [facility];  Service: Open Heart Surgery;  Laterality: N/A;  . Tee without cardioversion N/A 09/28/2014    Procedure: TRANSESOPHAGEAL ECHOCARDIOGRAM (TEE);  Surgeon: Tonny BollmanMichael Cooper,  MD;  [facility];  Service: Open Heart Surgery;  Laterality: N/A;  . Cardiac valve replacement    . Laparoscopic cholecystectomy  05-30-90  . Brain surgery    . Wound exploration Left 10/20/2014    Procedure: WOUND EXPLORATION;  Surgeon: Purcell Nailslarence H Owen, MD;  [facility];  Service: Thoracic;  Laterality: Left;  . Application of wound vac Left 10/20/2014    Procedure: APPLICATION OF WOUND VAC;  Surgeon: Purcell Nailslarence H Owen, MD;  [facility];  Service: Thoracic;  Laterality: Left;  . Cardioversion N/A 02/01/2015    Procedure: CARDIOVERSION;  Surgeon: Quintella Reichertraci R Turner, MD;  [facility]: [facility];  Service: Cardiovascular;  Laterality: N/A;    Current Medications: Outpatient Prescriptions Prior to Visit  Medication Sig Dispense Refill  . amiodarone (PACERONE) 200 MG tablet Take 1 tablet (200 mg total) by mouth daily. 30 tablet 1  . amLODipine (NORVASC) 2.5 MG tablet TAKE ONE TABLET BY MOUTH ONCE DAILY 30 tablet 11  . aspirin EC 81 MG tablet Take 1 tablet (81 mg total) by mouth daily.    . Calcium Carb-Cholecalciferol (CALCIUM 1000 + D PO) Take 1,000 mg by mouth daily with lunch.     . cyclobenzaprine (FLEXERIL) 10 MG tablet Take 5 mg by mouth at bedtime.     . ferrous sulfate 325 (65 FE) MG tablet TAKE ONE TABLET BY MOUTH ONCE DAILY WITH BREAKFAST 30 tablet 6  . levothyroxine (SYNTHROID, LEVOTHROID) 150 MCG tablet Take 150 mcg by mouth daily before breakfast.     . metoprolol succinate (TOPROL-XL) 50 MG 24 hr tablet TAKE 1 TABLET BY MOUTH ONCE DAILY WITH A MEAL OR IMMEDIATELY FOLLOWING A MEAL 30 tablet 11  . potassium chloride SA (K-DUR,KLOR-CON) 20 MEQ tablet Take 2 tablets (40 mEq total) by mouth 2 (two) times daily. 120 tablet 1  . warfarin (COUMADIN) 5 MG tablet Take 0.5 tablets (2.5 mg total) by mouth at bedtime. Or as directed.    . metolazone (ZAROXOLYN) 2.5 MG tablet Take as directed prior to Torsemide on Saturday and Wednesday 30 tablet 0  . torsemide (DEMADEX) 20 MG tablet Take 3  tablets (60 mg total) by mouth daily. (Patient not taking: Reported on 07/25/2015) 90 tablet 1   No facility-administered medications prior to visit.     Allergies:   Adhesive; Penicillins; and Sulfa antibiotics   Social History   Social History  . Marital Status: Single    Spouse Name: N/A  . Number of Children: 2  . Years of Education: N/A   Occupational History  . circulation desk clerk    Social History Main Topics  . Smoking status: Never Smoker   . Smokeless tobacco: Never Used  . Alcohol Use: No  . Drug Use: No  . Sexual Activity: Not Currently   Other Topics Concern  . None   Social History Narrative   [place] lives in [place]  with son.  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     Family History:  The patient's family history includes CAD in her father and mother; Cirrhosis in her brother; Heart attack in her sister; Hypertension in her father.   ROS:   Please see the history of present illness.    Review of Systems  HENT: Positive for headaches.   Hematologic/Lymphatic: Bruises/bleeds easily.  Gastrointestinal: Positive for nausea.  Neurological: Positive for dizziness.   All other systems reviewed and are negative.   Physical Exam:    VS:  BP 110/58 mmHg  Pulse 66  Ht  (1.499 m)  Wt 183 lb 12.8 oz (83.371 kg)  BMI 37.10 kg/m2   GEN: Well nourished, well developed, in no acute distress HEENT: normal Neck: no JVD, no masses Cardiac: Normal S1/S2, RRR; no murmurs, rubs, or gallops, no edema;     Respiratory:  clear to auscultation bilaterally; no wheezing, rhonchi or rales GI: soft, nontender, nondistended MS: no deformity or atrophy Skin: warm and dry Neuro: No focal deficits  Psych: Alert and oriented x 3, normal affect  Wt Readings from Last 3 Encounters:  07/25/15 183 lb 12.8 oz (83.371 kg)  07/08/15 196 lb 6.4 oz (89.086 kg)  03/16/15 183 lb (83.008 kg)      Studies/Labs Reviewed:     EKG:  EKG is  ordered today.  The ekg  ordered today demonstrates NSR, HR 66, normal axis, NSSTTW changes, QTc 501 ms  Recent Labs: 09/24/2014: ALT 23 09/29/2014: Magnesium 2.4 10/23/2014: TSH 6.445* 03/16/2015: Hemoglobin 11.7*; Platelets 54* 07/08/2015: BUN 25; Creat 1.22*; Potassium 4.0; Sodium 141   Recent Lipid Panel No results found for: CHOL, TRIG, HDL, CHOLHDL, VLDL, LDLCALC, LDLDIRECT  Additional studies/ records that were reviewed today include:   Echo 07/08/15 EF 45%, septal, post and inf HK, TAVR ok with peak and mean 18 and 11 mmHg, MVR ok with peak and mean 12 and 4 mmHg, mild MR, severe BAE, mild RVE, mod TR, PASP 71 mmHg  LHC 3/16 LM Widely patent  LAD patent LCx patent RCA patent Aortic Root Angiography: The aortic valve is severely calcified. There is mild aortic insufficiency. The aortic valve mobility is restricted.  The mechanical bileaflet mitral valve appears to have normal leaflet motion. The mitral annulus is severely calcified.  Final Conclusions:  1. Widely patent coronary arteries 2. Status post mechanical mitral valve replacement with normal motion of the bileaflet mechanical prosthesis by fluoroscopy 3. Severely calcified native aortic valve with restricted valve leaflet mobility and known severe aortic stenosis by echo criteria 4. Elevated right and left heart intracardiac filling pressures  Recommendations: cardiac surgical evaluation for consideration of redo sternotomy/conventional aortic valve replacement versus TAVR   Event Monitor 2/16 Sinus rhythm, PVCs  Myoview 4/11 Inf ischemia, EF 54%  ASSESSMENT:     1. Chronic combined systolic and diastolic CHF (congestive heart failure) (HCC)   2. Valvular heart disease   3. Atypical atrial flutter (HCC)   4. Essential hypertension   5. CKD (chronic kidney disease) stage 3, GFR 30-59 ml/min     PLAN:     In order of problems listed above:  1. Chronic combined systolic and diastolic HF - Volume improved.  Weight is down 13 lbs.  She  had dizziness and nausea with higher dose Torsemide and twice weekly Metolazone.  She has already had labs drawn today.  QTc is prolonged.  Will see what her K+ is on labs.    -  Continue Torsemide 40 mg QD  -  We discussed the importance of daily weights and when to take extra Torsemide.    2. Valvular heart disease - s/p remote mechanical MVR and recent TAVR in 2016.  Continue Coumadin. Continue SBE prophylaxis.  AV and MV gradient stable on recent Echo.   3. AFlutter - s/p DCCV in the past.  Maintaining NSR.  Continue Coumadin and Amiodarone.  TSH in 2/17 was 2.26.  ALT in 3/17 was 29.    4. HTN - Controlled.   5. CKD - Repeat BMET pending.    Medication Adjustments/Labs and Tests Ordered: Current medicines are reviewed at length with the patient today.  Concerns regarding medicines are outlined above.  Medication changes, Labs and Tests ordered today are outlined in the Patient Instructions noted below. Patient Instructions  Medication Instructions:  STOP taking Metolazone. DECREASE Torsemide to 40 mg Once daily  Labwork: Labs were already drawn today.  Testing/Procedures: None  Follow-Up: Dr. Armanda Magic in June as planned.  Any Other Special Instructions Will Be Listed Below (If Applicable). Weigh daily.  If weight goes up 2-3 lbs in 1 day, take extra Torsemide 10 mg and call. If you need a refill on your cardiac medications before your next appointment, please call your pharmacy.    Signed, Tereso Newcomer, PA-C  07/25/2015 4:34 PM    [facility] [address] [place], [place], [place]  [address] Phone: [phone]; Fax: [phone]

## 2015-07-25 ENCOUNTER — Ambulatory Visit (INDEPENDENT_AMBULATORY_CARE_PROVIDER_SITE_OTHER): Payer: Commercial Managed Care - HMO | Admitting: Physician Assistant

## 2015-07-25 ENCOUNTER — Encounter: Payer: Self-pay | Admitting: Physician Assistant

## 2015-07-25 ENCOUNTER — Other Ambulatory Visit (INDEPENDENT_AMBULATORY_CARE_PROVIDER_SITE_OTHER): Payer: Commercial Managed Care - HMO | Admitting: *Deleted

## 2015-07-25 VITALS — BP 110/58 | HR 66 | Ht 59.0 in | Wt 183.8 lb

## 2015-07-25 DIAGNOSIS — I38 Endocarditis, valve unspecified: Secondary | ICD-10-CM | POA: Diagnosis not present

## 2015-07-25 DIAGNOSIS — I272 Pulmonary hypertension, unspecified: Secondary | ICD-10-CM

## 2015-07-25 DIAGNOSIS — I10 Essential (primary) hypertension: Secondary | ICD-10-CM

## 2015-07-25 DIAGNOSIS — N183 Chronic kidney disease, stage 3 unspecified: Secondary | ICD-10-CM | POA: Insufficient documentation

## 2015-07-25 DIAGNOSIS — I484 Atypical atrial flutter: Secondary | ICD-10-CM | POA: Diagnosis not present

## 2015-07-25 DIAGNOSIS — I5032 Chronic diastolic (congestive) heart failure: Secondary | ICD-10-CM

## 2015-07-25 DIAGNOSIS — I5042 Chronic combined systolic (congestive) and diastolic (congestive) heart failure: Secondary | ICD-10-CM | POA: Diagnosis not present

## 2015-07-25 LAB — BASIC METABOLIC PANEL
BUN: 54 mg/dL — ABNORMAL HIGH (ref 7–25)
CALCIUM: 8.7 mg/dL (ref 8.6–10.4)
CHLORIDE: 90 mmol/L — AB (ref 98–110)
CO2: 31 mmol/L (ref 20–31)
CREATININE: 1.89 mg/dL — AB (ref 0.50–0.99)
Glucose, Bld: 276 mg/dL — ABNORMAL HIGH (ref 65–99)
Potassium: 3.2 mmol/L — ABNORMAL LOW (ref 3.5–5.3)
SODIUM: 135 mmol/L (ref 135–146)

## 2015-07-25 MED ORDER — TORSEMIDE 20 MG PO TABS: 40.0000 mg | ORAL_TABLET | Freq: Every day | ORAL | Status: DC

## 2015-07-25 NOTE — Patient Instructions (Addendum)
Medication Instructions:  STOP taking Metolazone. DECREASE Torsemide to 40 mg Once daily  Labwork: Labs were already drawn today.  Testing/Procedures: None  Follow-Up: Dr. Armanda Magicraci Turner in June as planned.  Any Other Special Instructions Will Be Listed Below (If Applicable). Weigh daily.  If weight goes up 2-3 lbs in 1 day, take extra Torsemide 10 mg and call. If you need a refill on your cardiac medications before your next appointment, please call your pharmacy.

## 2015-07-26 ENCOUNTER — Other Ambulatory Visit: Payer: Self-pay

## 2015-07-26 DIAGNOSIS — I5043 Acute on chronic combined systolic (congestive) and diastolic (congestive) heart failure: Secondary | ICD-10-CM

## 2015-07-26 DIAGNOSIS — I5042 Chronic combined systolic (congestive) and diastolic (congestive) heart failure: Secondary | ICD-10-CM

## 2015-07-26 LAB — BRAIN NATRIURETIC PEPTIDE: Brain Natriuretic Peptide: 194 pg/mL — ABNORMAL HIGH (ref <100)

## 2015-07-29 ENCOUNTER — Other Ambulatory Visit (INDEPENDENT_AMBULATORY_CARE_PROVIDER_SITE_OTHER): Payer: Commercial Managed Care - HMO | Admitting: *Deleted

## 2015-07-29 DIAGNOSIS — I5042 Chronic combined systolic (congestive) and diastolic (congestive) heart failure: Secondary | ICD-10-CM

## 2015-07-29 LAB — BASIC METABOLIC PANEL
Anion gap: 10 (ref 5–15)
BUN: 66 mg/dL — ABNORMAL HIGH (ref 6–20)
CALCIUM: 8.8 mg/dL — AB (ref 8.9–10.3)
CO2: 28 mmol/L (ref 22–32)
CREATININE: 1.91 mg/dL — AB (ref 0.44–1.00)
Chloride: 98 mmol/L — ABNORMAL LOW (ref 101–111)
GFR calc non Af Amer: 26 mL/min — ABNORMAL LOW (ref >60)
GFR, EST AFRICAN AMERICAN: 31 mL/min — AB (ref >60)
GLUCOSE: 270 mg/dL — AB (ref 65–99)
Potassium: 4.2 mmol/L (ref 3.5–5.1)
Sodium: 136 mmol/L (ref 135–145)

## 2015-08-01 ENCOUNTER — Other Ambulatory Visit: Payer: Self-pay

## 2015-08-01 DIAGNOSIS — I5042 Chronic combined systolic (congestive) and diastolic (congestive) heart failure: Secondary | ICD-10-CM

## 2015-08-04 ENCOUNTER — Other Ambulatory Visit (INDEPENDENT_AMBULATORY_CARE_PROVIDER_SITE_OTHER): Payer: Commercial Managed Care - HMO | Admitting: *Deleted

## 2015-08-04 DIAGNOSIS — I5042 Chronic combined systolic (congestive) and diastolic (congestive) heart failure: Secondary | ICD-10-CM | POA: Diagnosis not present

## 2015-08-04 LAB — BASIC METABOLIC PANEL
BUN: 40 mg/dL — ABNORMAL HIGH (ref 7–25)
CALCIUM: 8.3 mg/dL — AB (ref 8.6–10.4)
CO2: 25 mmol/L (ref 20–31)
CREATININE: 1.46 mg/dL — AB (ref 0.50–0.99)
Chloride: 105 mmol/L (ref 98–110)
Glucose, Bld: 179 mg/dL — ABNORMAL HIGH (ref 65–99)
Potassium: 4.4 mmol/L (ref 3.5–5.3)
SODIUM: 138 mmol/L (ref 135–146)

## 2015-08-09 ENCOUNTER — Encounter: Payer: Self-pay | Admitting: Cardiovascular Disease

## 2015-08-09 NOTE — Telephone Encounter (Signed)
Follow Up:; ° ° °Returning your call. °

## 2015-08-09 NOTE — Telephone Encounter (Signed)
This encounter was created in error - please disregard.

## 2015-09-08 ENCOUNTER — Other Ambulatory Visit: Payer: Self-pay | Admitting: Cardiovascular Disease

## 2015-09-15 ENCOUNTER — Encounter: Payer: Self-pay | Admitting: Cardiology

## 2015-09-15 ENCOUNTER — Ambulatory Visit (INDEPENDENT_AMBULATORY_CARE_PROVIDER_SITE_OTHER): Payer: Commercial Managed Care - HMO | Admitting: Cardiology

## 2015-09-15 VITALS — BP 130/70 | HR 65 | Wt 183.9 lb

## 2015-09-15 DIAGNOSIS — I480 Paroxysmal atrial fibrillation: Secondary | ICD-10-CM | POA: Diagnosis not present

## 2015-09-15 DIAGNOSIS — I350 Nonrheumatic aortic (valve) stenosis: Secondary | ICD-10-CM

## 2015-09-15 DIAGNOSIS — I10 Essential (primary) hypertension: Secondary | ICD-10-CM

## 2015-09-15 DIAGNOSIS — I059 Rheumatic mitral valve disease, unspecified: Secondary | ICD-10-CM | POA: Diagnosis not present

## 2015-09-15 DIAGNOSIS — I484 Atypical atrial flutter: Secondary | ICD-10-CM

## 2015-09-15 DIAGNOSIS — I5042 Chronic combined systolic (congestive) and diastolic (congestive) heart failure: Secondary | ICD-10-CM

## 2015-09-15 LAB — CBC WITH DIFFERENTIAL/PLATELET
BASOS PCT: 1 %
Basophils Absolute: 45 cells/uL (ref 0–200)
EOS PCT: 5 %
Eosinophils Absolute: 225 cells/uL (ref 15–500)
HEMATOCRIT: 32.6 % — AB (ref 35.0–45.0)
Hemoglobin: 11.1 g/dL — ABNORMAL LOW (ref 11.7–15.5)
LYMPHS PCT: 14 %
Lymphs Abs: 630 cells/uL — ABNORMAL LOW (ref 850–3900)
MCH: 31.3 pg (ref 27.0–33.0)
MCHC: 34 g/dL (ref 32.0–36.0)
MCV: 91.8 fL (ref 80.0–100.0)
MONO ABS: 405 {cells}/uL (ref 200–950)
MPV: 10.6 fL (ref 7.5–12.5)
Monocytes Relative: 9 %
Neutro Abs: 3195 cells/uL (ref 1500–7800)
Neutrophils Relative %: 71 %
PLATELETS: 55 10*3/uL — AB (ref 140–400)
RBC: 3.55 MIL/uL — AB (ref 3.80–5.10)
RDW: 14.8 % (ref 11.0–15.0)
WBC: 4.5 10*3/uL (ref 3.8–10.8)

## 2015-09-15 LAB — BASIC METABOLIC PANEL
BUN: 42 mg/dL — AB (ref 7–25)
CALCIUM: 9 mg/dL (ref 8.6–10.4)
CO2: 34 mmol/L — ABNORMAL HIGH (ref 20–31)
Chloride: 98 mmol/L (ref 98–110)
Creat: 1.68 mg/dL — ABNORMAL HIGH (ref 0.50–0.99)
Glucose, Bld: 193 mg/dL — ABNORMAL HIGH (ref 65–99)
POTASSIUM: 2.9 mmol/L — AB (ref 3.5–5.3)
SODIUM: 139 mmol/L (ref 135–146)

## 2015-09-15 LAB — HEPATIC FUNCTION PANEL
ALT: 23 U/L (ref 6–29)
AST: 38 U/L — ABNORMAL HIGH (ref 10–35)
Albumin: 3.7 g/dL (ref 3.6–5.1)
Alkaline Phosphatase: 123 U/L (ref 33–130)
BILIRUBIN DIRECT: 0.8 mg/dL — AB (ref <=0.2)
BILIRUBIN INDIRECT: 1.9 mg/dL — AB (ref 0.2–1.2)
BILIRUBIN TOTAL: 2.7 mg/dL — AB (ref 0.2–1.2)
Total Protein: 6.9 g/dL (ref 6.1–8.1)

## 2015-09-15 LAB — TSH: TSH: 2.56 mIU/L

## 2015-09-15 NOTE — Patient Instructions (Signed)
Medication Instructions:  Your physician recommends that you continue on your current medications as directed. Please refer to the Current Medication list given to you today.   Labwork: TODAY: BMET, CBC, TSH, LFTs  Testing/Procedures: Your physician has recommended that you have a pulmonary function test. Pulmonary Function Tests are a group of tests that measure how well air moves in and out of your lungs.  Follow-Up: Your physician wants you to follow-up in: 6 months with Dr. Turner. You will receive a reminder letter in the mail two months in advance. If you don't receive a letter, please call our office to schedule the follow-up appointment.   Any Other Special Instructions Will Be Listed Below (If Applicable).     If you need a refill on your cardiac medications before your next appointment, please call your pharmacy.   

## 2015-09-15 NOTE — Progress Notes (Signed)
Cardiology Office Note    Date:  09/15/2015   ID:  Jamie Dessrene C Eckert, DOB 07/27/1950, MRN [id number]  PCP:  Londell MohPHARR,[name], MD  Cardiologist:  Armanda Magicraci Turner, MD   Chief Complaint  Patient presents with  . Atrial Fibrillation  . Aortic Stenosis    History of Present Illness:  Jamie Howell is a 65 y.o. female who presents for follow-up evaluation. She has rheumatic heart disease with previous mechanical mitral valve replacement on chronic warfarin therapy. She underwent TAVR 09/28/2014. Patient has hepatic cirrhosis, chronic thrombocytopenia, and splenomegaly. Her platelet count has been ranging around 50,000. She has not had any recent bleeding complications. 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 She saw Dr. Excell Seltzerooper back in April and was volume overloaded.  Her Demedex was increased and given several doses of metolazone. She feels much better now on higher dose of diuretics. She is back today for followup and is doing well.  She denies chest pain, SOB, DOE, orthopnea, PND. She occasionally has some mild LE edema but it is controlled on diuretics. She denies any palpitations, dizziness or syncope.   Past Medical History  Diagnosis Date  . HTN (hypertension)   . Thyrotoxicosis      without mention of goiter or other cause, without mention of thyrotoxic crisis or storm  . Hypothyroidism   . 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 Atypical atrial flutter (HCC)     s/p DCCV  . Subdural hematoma (HCC)     spontaneous  . Pulmonary HTN (HCC)     severe by echo 07/2015  . Aortic valve stenosis     moderate aortic valve stenosis with moderate aortic insufficiency, stable MVR, trivial PR, mild TR, severe LAE, grade II-III diastolic dysfunction,mild pulmonary HTN , EF 50% by echo 12/2012  . S/P mitral valve replacement with metallic valve 04/08/1991    31 mm St Jude bileaflet  mechanical valve placed for rheumatic mitral stenosis  . Paroxysmal atrial fibrillation (HCC)   . Obesity (BMI 30-39.9) 07/08/2014  . Splenomegaly 07/14/2014  . Portal hypertension with esophageal varices (HCC) 07/14/2014  . Temporary low platelet count (HCC)   . 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 Chronic combined systolic (congestive) and diastolic (congestive) heart failure (HCC)   . Pneumonia ~ 2014; 05/2014  . Anemia   . History of blood transfusion 09/2014    "during hospitalization"  . Rheumatic fever   . Paroxysmal atrial tachycardia (HCC)     s/p DCCV    Past Surgical History  Procedure Laterality Date  . Mitral valve replacement  04/08/1991    31mm St Jude mechanical valve - Dr Andrey CampanileWilson  . Left and right heart catheterization with coronary angiogram N/A 06/28/2014    Procedure: LEFT AND RIGHT HEART CATHETERIZATION WITH CORONARY ANGIOGRAM;  Surgeon: Tonny BollmanMichael Cooper, MD; Normal coronaries, MVR OK, severe AS, elevated R heart pressures    . Subdural hematoma evacuation via craniotomy  2000    Dr Dutch QuintPoole  . Total abdominal hysterectomy  07-23-1994  . Tonsillectomy    . Multiple extractions with alveoloplasty N/A 08/05/2014    Procedure: Extraction of tooth #'s 3,4,5,7,8,9,10,11,12,13,17,20,21,22,23,24,25,26,27, 28,29, and 32 with alveoloplasty;  Surgeon: Charlynne Panderonald F Kulinski, DDS;  Location: [facility] OR;  Service: Oral Surgery;  Laterality: N/A;  . Cardiac catheterization      06/2014  . Transcatheter aortic valve replacement, transfemoral N/A 09/28/2014    Procedure: TRANSCATHETER AORTIC VALVE REPLACEMENT, TRANSFEMORAL;  Surgeon: Tonny Bollman, MD;  Location: [facility] OR;  Service: Open Heart Surgery;  Laterality: N/A;  . Tee without cardioversion N/A 09/28/2014    Procedure: TRANSESOPHAGEAL ECHOCARDIOGRAM (TEE);  Surgeon: Tonny Bollman, MD;  Location: [facility] OR;  Service: Open Heart  Surgery;  Laterality: N/A;  . Cardiac valve replacement    . Laparoscopic cholecystectomy  05-30-90  . Brain surgery    . Wound exploration Left 10/20/2014    Procedure: WOUND EXPLORATION;  Surgeon: [name], MD;  Location: [facility] OR;  Service: Thoracic;  Laterality: Left;  . Application of wound vac Left 10/20/2014    Procedure: APPLICATION OF WOUND VAC;  Surgeon: [name], MD;  Location[facility];  Service: Thoracic;  Laterality: Left;  . Cardioversion N/A 02/01/2015    Procedure: CARDIOVERSION;  Surgeon: Quintella Reichert, MD;  Location: [facility];  Service: Cardiovascular;  Laterality: N/A;    Current Medications: Outpatient Prescriptions Prior to Visit  Medication Sig Dispense Refill  . amiodarone (PACERONE) 200 MG tablet Take 1 tablet (200 mg total) by mouth daily. 30 tablet 1  . amLODipine (NORVASC) 2.5 MG tablet TAKE ONE TABLET BY MOUTH ONCE DAILY 30 tablet 11  . aspirin EC 81 MG tablet Take 1 tablet (81 mg total) by mouth daily.    . Calcium Carb-Cholecalciferol (CALCIUM 1000 + D PO) Take 1,000 mg by mouth daily with lunch.     . cyclobenzaprine (FLEXERIL) 10 MG tablet Take 5 mg by mouth at bedtime.     . ferrous sulfate 325 (65 FE) MG tablet TAKE ONE TABLET BY MOUTH ONCE DAILY WITH BREAKFAST 30 tablet 6  . KLOR-CON M20 20 MEQ tablet TAKE TWO TABLETS BY MOUTH TWICE DAILY 120 tablet 0  . levothyroxine (SYNTHROID, LEVOTHROID) 150 MCG tablet Take 150 mcg by mouth daily before breakfast.     . metoprolol succinate (TOPROL-XL) 50 MG 24 hr tablet TAKE 1 TABLET BY MOUTH ONCE DAILY WITH A MEAL OR IMMEDIATELY FOLLOWING A MEAL 30 tablet 11  . potassium chloride SA (K-DUR,KLOR-CON) 20 MEQ tablet Take 3 tablets by mouth every morning and 2 tablets every evening 120 tablet 1  . torsemide (DEMADEX) 20 MG tablet Take 40 mg by mouth daily.  60 tablet 1  . warfarin (COUMADIN) 5 MG tablet Take 0.5 tablets (2.5 mg total) by mouth at bedtime. Or as directed.     No facility-administered  medications prior to visit.     Allergies:   Adhesive; Penicillins; and Sulfa antibiotics   Social History   Social History  . Marital Status: Single    Spouse Name: N/A  . Number of Children: 2  . Years of Education: N/A   Occupational History  . circulation desk clerk    Social History Main Topics  . Smoking status: Never Smoker   . Smokeless tobacco: Never Used  . Alcohol Use: No  . Drug Use: No  . Sexual Activity: Not Currently   Other Topics Concern  . None   Social History Narrative   Pt lives in [place] with son.  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     Family History:  The patient's family history includes CAD in her father and mother; Cirrhosis in her brother; Heart attack in her sister; Hypertension in her father.   ROS:  Please see the history of present illness.    ROS All other systems reviewed and are negative.   PHYSICAL EXAM:   VS:  BP 130/70 mmHg  Pulse 65  Wt 183 lb 14.4 oz (83.416 kg)  SpO2 99%   GEN: Well nourished, well developed, in no acute distress HEENT: normal Neck: no JVD, carotid bruits, or masses Cardiac: RRR; no murmurs, rubs, or gallops,no edema.  Intact distal pulses bilaterally.  Respiratory:  clear to auscultation bilaterally, normal work of breathing GI: soft, nontender, nondistended, + BS MS: no deformity or atrophy Skin: warm and dry, no rash Neuro:  Alert and Oriented x 3, Strength and sensation are intact Psych: euthymic mood, full affect  Wt Readings from Last 3 Encounters:  09/15/15 183 lb 14.4 oz (83.416 kg)  07/25/15 183 lb 12.8 oz (83.371 kg)  07/08/15 196 lb 6.4 oz (89.086 kg)      Studies/Labs Reviewed:   EKG:  EKG is not ordered today.    Recent Labs: 09/24/2014: ALT 23 09/29/2014: Magnesium 2.4 10/23/2014: TSH 6.445* 03/16/2015: Hemoglobin 11.7*; Platelets 54* 07/25/2015: Brain Natriuretic Peptide 194.0* 08/04/2015: BUN 40*; Creat 1.46*; Potassium 4.4; Sodium 138   Lipid Panel No results  found for: CHOL, TRIG, HDL, CHOLHDL, VLDL, LDLCALC, LDLDIRECT  Additional studies/ records that were reviewed today include:  none    ASSESSMENT:    1. Rheumatic mitral valve disease   2. PAF (paroxysmal atrial fibrillation) (HCC)   3. Essential hypertension   4. Severe aortic valve stenosis   5. Atypical atrial flutter (HCC)   6. Chronic combined systolic (congestive) and diastolic (congestive) heart failure (HCC)      PLAN:  In order of problems listed above:  1. Rheumatic mitral valve disease with mitral stenosis s/p mechanical St. Jude MVR on chronic anticoagulation with warfarin.  Continue ASA. 2. PAF- maintaining NSR on Amiodarone and warfarin as well as BB.  Check LFTs, PFTs with DLCO and TSH. 3. HTN - BP controlled on current medical regimen. Continue BB/amlodipine. 4. Severe AS s/p TAVR.  Continue ASA. 5. Atypical flutter s/p DCCV - maintaining NSR.  Continue Amio/BB/warfarin.   6.  Chronic combined systolic/diastolic CHF which appears stable - she appears euvolemic on exam today.  Her weight is down 13lbs after increasing her diuretic dose.  She is NYHA class 2.  Continue on higher dose of demedex daily. Check BMET.   Medication Adjustments/Labs and Tests Ordered: Current medicines are reviewed at length with the patient today.  Concerns regarding medicines are outlined above.  Medication changes, Labs and Tests ordered today are listed in the Patient Instructions below.  There are no Patient Instructions on file for this visit.   Signed, Armanda Magic, MD  09/15/2015 9:34 AM    [facility] [address] [place], [place], [place]  [address] Phone: [phone]; Fax: [phone]

## 2015-09-19 ENCOUNTER — Telehealth: Payer: Self-pay | Admitting: Cardiology

## 2015-09-19 DIAGNOSIS — I5042 Chronic combined systolic (congestive) and diastolic (congestive) heart failure: Secondary | ICD-10-CM

## 2015-09-19 DIAGNOSIS — I5032 Chronic diastolic (congestive) heart failure: Secondary | ICD-10-CM

## 2015-09-19 DIAGNOSIS — I10 Essential (primary) hypertension: Secondary | ICD-10-CM

## 2015-09-19 MED ORDER — TORSEMIDE 20 MG PO TABS: 20.0000 mg | ORAL_TABLET | Freq: Every day | ORAL | Status: DC

## 2015-09-19 NOTE — Telephone Encounter (Signed)
-----   Message from Quintella Reichertraci R Turner, MD sent at 09/15/2015  9:59 PM EDT ----- Abnormal LFTs , CBC stable with stable low platelet count.  Worsening renal function and hypokalemia.  Decrease demadex to 20mg  daily. Please verify with patient what dose of potassium she is currently taking to determine dose to replete.  Please forward these labs to PCP to followup on CBC and LFTs

## 2015-09-19 NOTE — Telephone Encounter (Signed)
New message  ° ° °Patient returning call back to nurse  - test results  °

## 2015-09-19 NOTE — Telephone Encounter (Signed)
Informed patient of results and verbal understanding expressed.  Confirmed with patient she has been taking Demadex 40 mg daily. Instructed her to DECREASE DEMADEX to 20 mg daily. Confirmed with patient she has been taking Kdur 40 meg in the AM and 40 meq in the PM.  Per Tereso NewcomerScott Weaver, instructed patient to take an extra 40 meq Kdur (for a total of 80 mg) tonight. Repeat BMET to be drawn this Thursday. Labs forwarded to PCP. Patient agrees with treatment plan.

## 2015-09-22 ENCOUNTER — Other Ambulatory Visit (INDEPENDENT_AMBULATORY_CARE_PROVIDER_SITE_OTHER): Payer: Commercial Managed Care - HMO | Admitting: *Deleted

## 2015-09-22 DIAGNOSIS — I5032 Chronic diastolic (congestive) heart failure: Secondary | ICD-10-CM | POA: Diagnosis not present

## 2015-09-22 DIAGNOSIS — I5042 Chronic combined systolic (congestive) and diastolic (congestive) heart failure: Secondary | ICD-10-CM | POA: Diagnosis not present

## 2015-09-22 DIAGNOSIS — I10 Essential (primary) hypertension: Secondary | ICD-10-CM | POA: Diagnosis not present

## 2015-09-22 LAB — BASIC METABOLIC PANEL
BUN: 47 mg/dL — ABNORMAL HIGH (ref 7–25)
CALCIUM: 8.5 mg/dL — AB (ref 8.6–10.4)
CO2: 30 mmol/L (ref 20–31)
CREATININE: 1.78 mg/dL — AB (ref 0.50–0.99)
Chloride: 98 mmol/L (ref 98–110)
Glucose, Bld: 197 mg/dL — ABNORMAL HIGH (ref 65–99)
Potassium: 3.7 mmol/L (ref 3.5–5.3)
Sodium: 135 mmol/L (ref 135–146)

## 2015-09-22 NOTE — Addendum Note (Signed)
Addended by: Tonita PhoenixBOWDEN, ROBIN K on: 09/22/2015 07:45 AM   Modules accepted: Orders

## 2015-09-22 NOTE — Addendum Note (Signed)
Addended by: BOWDEN, ROBIN K on: 09/22/2015 07:45 AM   Modules accepted: Orders  

## 2015-09-30 ENCOUNTER — Telehealth: Payer: Self-pay

## 2015-09-30 DIAGNOSIS — I5032 Chronic diastolic (congestive) heart failure: Secondary | ICD-10-CM

## 2015-09-30 DIAGNOSIS — I480 Paroxysmal atrial fibrillation: Secondary | ICD-10-CM

## 2015-09-30 NOTE — Telephone Encounter (Signed)
-----   Message from Beatrice LecherScott T Weaver, [place] sent at 09/22/2015  5:08 PM EDT ----- K+ ok Creatinine stable Repeat BMET 2 weeks. Tereso NewcomerScott Weaver, PA-C   09/22/2015 5:08 PM

## 2015-09-30 NOTE — Telephone Encounter (Signed)
Informed patient of results and verbal understanding expressed.   Repeat BMET scheduled for Thursday, July 6. Patient agrees with treatment plan.

## 2015-10-05 ENCOUNTER — Other Ambulatory Visit: Payer: Self-pay | Admitting: Cardiovascular Disease

## 2015-10-06 ENCOUNTER — Other Ambulatory Visit (INDEPENDENT_AMBULATORY_CARE_PROVIDER_SITE_OTHER): Payer: Commercial Managed Care - HMO | Admitting: *Deleted

## 2015-10-06 ENCOUNTER — Encounter (INDEPENDENT_AMBULATORY_CARE_PROVIDER_SITE_OTHER): Payer: Self-pay

## 2015-10-06 DIAGNOSIS — I480 Paroxysmal atrial fibrillation: Secondary | ICD-10-CM

## 2015-10-06 DIAGNOSIS — I5032 Chronic diastolic (congestive) heart failure: Secondary | ICD-10-CM | POA: Diagnosis not present

## 2015-10-06 LAB — BASIC METABOLIC PANEL
BUN: 29 mg/dL — ABNORMAL HIGH (ref 7–25)
CALCIUM: 8.4 mg/dL — AB (ref 8.6–10.4)
CO2: 28 mmol/L (ref 20–31)
CREATININE: 1.62 mg/dL — AB (ref 0.50–0.99)
Chloride: 102 mmol/L (ref 98–110)
Glucose, Bld: 191 mg/dL — ABNORMAL HIGH (ref 65–99)
Potassium: 4.8 mmol/L (ref 3.5–5.3)
Sodium: 136 mmol/L (ref 135–146)

## 2015-11-25 ENCOUNTER — Other Ambulatory Visit: Payer: Self-pay | Admitting: *Deleted

## 2015-11-25 MED ORDER — AMIODARONE HCL 200 MG PO TABS: 200.0000 mg | ORAL_TABLET | Freq: Every day | ORAL | 2 refills | Status: DC

## 2015-12-02 ENCOUNTER — Encounter: Payer: Self-pay | Admitting: Nurse Practitioner

## 2015-12-02 ENCOUNTER — Ambulatory Visit (INDEPENDENT_AMBULATORY_CARE_PROVIDER_SITE_OTHER): Payer: Commercial Managed Care - HMO | Admitting: Nurse Practitioner

## 2015-12-02 ENCOUNTER — Telehealth: Payer: Self-pay | Admitting: Cardiology

## 2015-12-02 ENCOUNTER — Encounter (INDEPENDENT_AMBULATORY_CARE_PROVIDER_SITE_OTHER): Payer: Self-pay

## 2015-12-02 VITALS — BP 138/70 | HR 63 | Ht 59.0 in | Wt 199.8 lb

## 2015-12-02 DIAGNOSIS — Z79899 Other long term (current) drug therapy: Secondary | ICD-10-CM

## 2015-12-02 DIAGNOSIS — I5042 Chronic combined systolic (congestive) and diastolic (congestive) heart failure: Secondary | ICD-10-CM | POA: Diagnosis not present

## 2015-12-02 DIAGNOSIS — I480 Paroxysmal atrial fibrillation: Secondary | ICD-10-CM

## 2015-12-02 DIAGNOSIS — I38 Endocarditis, valve unspecified: Secondary | ICD-10-CM

## 2015-12-02 DIAGNOSIS — I10 Essential (primary) hypertension: Secondary | ICD-10-CM

## 2015-12-02 DIAGNOSIS — I5032 Chronic diastolic (congestive) heart failure: Secondary | ICD-10-CM | POA: Diagnosis not present

## 2015-12-02 DIAGNOSIS — Z952 Presence of prosthetic heart valve: Secondary | ICD-10-CM

## 2015-12-02 DIAGNOSIS — Z954 Presence of other heart-valve replacement: Secondary | ICD-10-CM

## 2015-12-02 LAB — HEPATIC FUNCTION PANEL
ALT: 22 U/L (ref 6–29)
AST: 42 U/L — ABNORMAL HIGH (ref 10–35)
Albumin: 3.5 g/dL — ABNORMAL LOW (ref 3.6–5.1)
Alkaline Phosphatase: 125 U/L (ref 33–130)
Bilirubin, Direct: 1.3 mg/dL — ABNORMAL HIGH (ref <=0.2)
Indirect Bilirubin: 1.7 mg/dL — ABNORMAL HIGH (ref 0.2–1.2)
Total Bilirubin: 3 mg/dL — ABNORMAL HIGH (ref 0.2–1.2)
Total Protein: 6.9 g/dL (ref 6.1–8.1)

## 2015-12-02 LAB — BASIC METABOLIC PANEL
BUN: 56 mg/dL — ABNORMAL HIGH (ref 7–25)
CO2: 28 mmol/L (ref 20–31)
Calcium: 8.7 mg/dL (ref 8.6–10.4)
Chloride: 97 mmol/L — ABNORMAL LOW (ref 98–110)
Creat: 2.5 mg/dL — ABNORMAL HIGH (ref 0.50–0.99)
Glucose, Bld: 124 mg/dL — ABNORMAL HIGH (ref 65–99)
Potassium: 3.3 mmol/L — ABNORMAL LOW (ref 3.5–5.3)
Sodium: 135 mmol/L (ref 135–146)

## 2015-12-02 LAB — CBC
HCT: 30.2 % — ABNORMAL LOW (ref 35.0–45.0)
Hemoglobin: 10 g/dL — ABNORMAL LOW (ref 11.7–15.5)
MCH: 29.2 pg (ref 27.0–33.0)
MCHC: 33.1 g/dL (ref 32.0–36.0)
MCV: 88 fL (ref 80.0–100.0)
MPV: 10 fL (ref 7.5–12.5)
Platelets: 60 10*3/uL — ABNORMAL LOW (ref 140–400)
RBC: 3.43 MIL/uL — ABNORMAL LOW (ref 3.80–5.10)
RDW: 16.6 % — ABNORMAL HIGH (ref 11.0–15.0)
WBC: 3.9 10*3/uL (ref 3.8–10.8)

## 2015-12-02 LAB — TSH: TSH: 1.96 mIU/L

## 2015-12-02 NOTE — Telephone Encounter (Signed)
New message      Pt c/o swelling: STAT is pt has developed SOB within 24 hours  1. How long have you been experiencing swelling? recently   2. Where is the swelling located? Both legs affecting her breathing   3.  Are you currently taking a "fluid pill"? yes   4.  Are you currently SOB? yes  5.  Have you traveled recently? no

## 2015-12-02 NOTE — Progress Notes (Signed)
CARDIOLOGY OFFICE NOTE  Date:  12/02/2015    Jamie Howell Date of Birth: 21-Sep-1950 Medical Record #[id number]  PCP:  Londell Moh, MD  Cardiologist:  Mayford Knife  Chief Complaint  Patient presents with  . Cardiac Valve Problem  . Congestive Heart Failure    Work in visit - seen for Dr. Mayford Knife    History of Present Illness: Jamie Howell is a 65 y.o. female who presents today for a work in visit. Seen for Dr. Mayford Knife.   She has a history of rheumatic heart disease with previous mechanical mitral valve replacement on chronic warfarin therapy. She underwent TAVR 09/28/2014. Other issues include hepatic cirrhosis, chronic thrombocytopenia, and splenomegaly. Her platelet count has been ranging around 50,000. She has not had any recent bleeding complications.  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 She saw Dr. Excell Seltzer back in April and was volume overloaded.  Her Demedex was increased and given several doses of metolazone.   She was last seen back in June and felt to be doing ok. Weight was back down with diuresis.   Phone call today - noted significant weight gain - thus added to my schedule.   Comes in today. Here with a friend. Says she called here last week wanting to be seen due to her her weight going up - was told there was no availability until October was not offered an appointment. Then called back today. She increased her Demedex on her own for the past 3 days with some relief of symptoms. She has felt more short of breath. More swollen. Appetite poor. No fever or chills. No real extra salt. Can't really identify a trigger. Friend thinks she looks more pale. She continues to work at the [employer].   Past Medical History:  Diagnosis Date  . Anemia   . Aortic valve stenosis    moderate aortic valve stenosis with moderate aortic insufficiency, stable MVR, trivial PR, mild TR, severe LAE, grade  II-III diastolic dysfunction,mild pulmonary HTN , EF 50% by echo 12/2012  . Atypical atrial flutter (HCC)    s/p DCCV  . Chronic combined systolic (congestive) and diastolic (congestive) heart failure (HCC)   . History of blood transfusion 09/2014   "during hospitalization"  . HTN (hypertension)   . Hypothyroidism   . 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 Obesity (BMI 30-39.9) 07/08/2014  . Paroxysmal atrial fibrillation (HCC)   . Paroxysmal atrial tachycardia (HCC)    s/p DCCV  . Pneumonia ~ 2014; 05/2014  . Portal hypertension with esophageal varices (HCC) 07/14/2014  . Pulmonary HTN (HCC)    severe by echo 07/2015  . Rheumatic fever   . S/P mitral valve replacement with metallic valve 04/08/1991   31 mm St Jude bileaflet mechanical valve placed for rheumatic mitral stenosis  . 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 Splenomegaly 07/14/2014  . Subdural hematoma (HCC)    spontaneous  . Temporary low platelet count (HCC)   . Thyrotoxicosis     without mention of goiter or other cause, without mention of thyrotoxic crisis or storm    Past Surgical History:  Procedure Laterality Date  . APPLICATION OF WOUND VAC Left 10/20/2014   Procedure: APPLICATION OF WOUND VAC;  Surgeon: [name],  MD;  Location: [facility] OR;  Service: Thoracic;  Laterality: Left;  . BRAIN SURGERY    . CARDIAC CATHETERIZATION     06/2014  . CARDIAC VALVE REPLACEMENT    . CARDIOVERSION N/A 02/01/2015   Procedure: CARDIOVERSION;  Surgeon: Quintella Reichert, MD;  Location: [facility];  Service: Cardiovascular;  Laterality: N/A;  . LAPAROSCOPIC CHOLECYSTECTOMY  05-30-90  . LEFT AND RIGHT HEART CATHETERIZATION WITH CORONARY ANGIOGRAM N/A 06/28/2014   Procedure: LEFT AND RIGHT HEART CATHETERIZATION WITH CORONARY ANGIOGRAM;  Surgeon: Tonny Bollman, MD; Normal coronaries,  MVR OK, severe AS, elevated R heart pressures    . MITRAL VALVE REPLACEMENT  04/08/1991   31mm St Jude mechanical valve - Dr Andrey Campanile  . MULTIPLE EXTRACTIONS WITH ALVEOLOPLASTY N/A 08/05/2014   Procedure: Extraction of tooth #'s 3,4,5,7,8,9,10,11,12,13,17,20,21,22,23,24,25,26,27, 28,29, and 32 with alveoloplasty;  Surgeon: Charlynne Pander, DDS;  Location: [facility] OR;  Service: Oral Surgery;  Laterality: N/A;  . SUBDURAL HEMATOMA EVACUATION VIA CRANIOTOMY  2000   Dr Dutch Quint  . TEE WITHOUT CARDIOVERSION N/A 09/28/2014   Procedure: TRANSESOPHAGEAL ECHOCARDIOGRAM (TEE);  Surgeon: Tonny Bollman, MD;  Location[facility] OR;  Service: Open Heart Surgery;  Laterality: N/A;  . TONSILLECTOMY    . TOTAL ABDOMINAL HYSTERECTOMY  07-23-1994  . TRANSCATHETER AORTIC VALVE REPLACEMENT, TRANSFEMORAL N/A 09/28/2014   Procedure: TRANSCATHETER AORTIC VALVE REPLACEMENT, TRANSFEMORAL;  Surgeon: Tonny Bollman, MD;  Location: [facility] OR;  Service: Open Heart Surgery;  Laterality: N/A;  . WOUND EXPLORATION Left 10/20/2014   Procedure: WOUND EXPLORATION;  Surgeon: [name], MD;  Location: [facility] OR;  Service: Thoracic;  Laterality: Left;     Medications: Current Outpatient Prescriptions  Medication Sig Dispense Refill  . amiodarone (PACERONE) 200 MG tablet Take 1 tablet (200 mg total) by mouth daily. 90 tablet 2  . amLODipine (NORVASC) 2.5 MG tablet TAKE ONE TABLET BY MOUTH ONCE DAILY 30 tablet 11  . aspirin EC 81 MG tablet Take 1 tablet (81 mg total) by mouth daily.    . Calcium Carb-Cholecalciferol (CALCIUM 1000 + D PO) Take 1,000 mg by mouth daily with lunch.     . cyclobenzaprine (FLEXERIL) 10 MG tablet Take 5 mg by mouth at bedtime.     . ferrous sulfate 325 (65 FE) MG tablet TAKE ONE TABLET BY MOUTH ONCE DAILY WITH BREAKFAST 30 tablet 6  . KLOR-CON M20 20 MEQ tablet TAKE TWO TABLETS BY MOUTH TWICE DAILY 120 tablet 11  . levothyroxine (SYNTHROID, LEVOTHROID) 150 MCG tablet Take 150 mcg by mouth daily before breakfast.     .  metoprolol succinate (TOPROL-XL) 50 MG 24 hr tablet TAKE 1 TABLET BY MOUTH ONCE DAILY WITH A MEAL OR IMMEDIATELY FOLLOWING A MEAL 30 tablet 11  . torsemide (DEMADEX) 20 MG tablet Take 1 tablet (20 mg total) by mouth daily. 30 tablet 11  . warfarin (COUMADIN) 5 MG tablet Take 0.5 tablets (2.5 mg total) by mouth at bedtime. Or as directed.     No current facility-administered medications for this visit.     Allergies: Allergies  Allergen Reactions  . Adhesive [Tape] Itching    Adhesive tape and backing (pls use elastic bandages with no adhesive)  . Penicillins Rash  . Sulfa Antibiotics Rash    Social History: The patient  reports that she has never smoked. She has never used smokeless tobacco. She reports that she does not drink alcohol or use drugs.   Family History: The patient's family history includes CAD in her father and mother; Cirrhosis  in her brother; Heart attack in her sister; Hypertension in her father.   Review of Systems: Please see the history of present illness.   Otherwise, the review of systems is positive for none.   All other systems are reviewed and negative.   Physical Exam: VS:  BP 138/70   Pulse 63   Ht 4\' 11"  (1.499 m)   Wt 199 lb 12.8 oz (90.6 kg)   BMI 40.35 kg/m  .  BMI Body mass index is 40.35 kg/m.  Wt Readings from Last 3 Encounters:  12/02/15 199 lb 12.8 oz (90.6 kg)  09/15/15 183 lb 14.4 oz (83.4 kg)  07/25/15 183 lb 12.8 oz (83.4 kg)    General: Pleasant. She appears chronically ill. She is alert and in no acute distress. Color is quite sallow - almost a little jaundice.  HEENT: Normal.  Neck: Supple, no JVD, carotid bruits, or masses noted.  Cardiac: Regular rate and rhythm. Outflow murmur. MV click noted. 2+ edema.  Respiratory:  Lungs are clear to auscultation bilaterally with normal work of breathing.  GI: Soft and nontender.  MS: No deformity or atrophy. Gait and ROM intact.  Skin: Warm and dry. Color is sallow Neuro:  Strength and  sensation are intact and no gross focal deficits noted.  Psych: Alert, appropriate and with normal affect.   LABORATORY DATA:  EKG:  EKG is ordered today. This demonstrates NSR with PVC.  Lab Results  Component Value Date   WBC 4.5 09/15/2015   HGB 11.1 (L) 09/15/2015   HCT 32.6 (L) 09/15/2015   PLT 55 (L) 09/15/2015   GLUCOSE 191 (H) 10/06/2015   ALT 23 09/15/2015   AST 38 (H) 09/15/2015   NA 136 10/06/2015   K 4.8 10/06/2015   CL 102 10/06/2015   CREATININE 1.62 (H) 10/06/2015   BUN 29 (H) 10/06/2015   CO2 28 10/06/2015   TSH 2.56 09/15/2015   INR 2.43 (H) 01/31/2015   HGBA1C 6.2 (H) 09/24/2014    BNP (last 3 results)  Recent Labs  07/08/15 1635 07/25/15 1432  BNP 388.9* 194.0*    ProBNP (last 3 results) No results for input(s): PROBNP in the last 8760 hours.   Other Studies Reviewed Today:  Echo Study Conclusions from April 2017  - Left ventricle: LVEF is approximately 45% with septal, posterior   and inferior hypokinesis. The cavity size was normal. Wall   thickness was normal. - Aortic valve: AV prosthesis is difficult to see well Peak nad   mean gradients through the valve are 18 and 11 mm Hg   respectively. - Mitral valve: MV prosthesiss is difficult to see well Peak and   mean gradients through the valve arer 12 and 4 mm Hg   respectively. There was mild regurgitation. - Left atrium: The atrium was severely dilated. - Right ventricle: The cavity size was mildly dilated. - Right atrium: The atrium was severely dilated. - Tricuspid valve: There was moderate regurgitation. - Pulmonary arteries: PA peak pressure: 71 mm Hg (S).  Assessment/Plan:  1. Acute on chronic combined systolic and diastolic HF - no real trigger noted. She has used Zaroxolyn in the past - will repeat 2.5 mg for 3 days along with Demedex 20 mg BID. Increase Potassium to 3 pills twice a day. See back on Tuesday - if fails to improve - would need admission for IV diuresis. Do not  think we need to get the echo updated. Lab today as well. Further disposition to follow.  2. Rheumatic heart disease with prior MVR and s/p TAVR - echo from April noted.   3. Chronic amiodarone therapy - in NSR - lab today  4. HTN - BP ok on current regimen  5. Prior atypical flutter with past cardioversion - remains in NSR  6. Thrombocytopenia - lab today.    Current medicines are reviewed with the patient today.  The patient does not have concerns regarding medicines other than what has been noted above.  The following changes have been made:  See above.  Labs/ tests ordered today include:    Orders Placed This Encounter  Procedures  . Brain natriuretic peptide  . Basic metabolic panel  . CBC  . Hepatic function panel  . TSH  . EKG 12-Lead     Disposition:   FU with me on Tuesday.   Patient is agreeable to this plan and will call if any problems develop in the interim.   Signed: Rosalio MacadamiaLori C. Emmali Karow, RN, ANP-C 12/02/2015 11:37 AM  [facility] [address] [place], [place]  [address] Phone: [phone] Fax: [phone]

## 2015-12-02 NOTE — Patient Instructions (Addendum)
We will be checking the following labs today - BMET, BNP, CBC, HPF and TSH  Medication Instructions:    Continue with your current medicines. BUT  I want you to take Metolazone 2.5 mg today, tomorrow and Sunday (try to take about 30 minutes before your dose of Demadex)  I want you to stay on the increased Demedex taking one in the morning and one in the early afternoon  I want you to increase your potassium to 3 tablets twice a day    Testing/Procedures To Be Arranged:  N/A  Follow-Up:   See me on Tuesday     Other Special Instructions:   N/A    If you need a refill on your cardiac medications before your next appointment, please call your pharmacy.   Call the [facility] office at [phone] if you have any questions, problems or concerns.

## 2015-12-02 NOTE — Telephone Encounter (Signed)
I spoke with the pt and she complains of swelling, SOB and weight gain.  The pt has had ongoing symptoms for the past week and she thinks her weight is up 10-15 lbs.  The pt has taken demadex as prescribed but not used her PRN metolazone.  The pt requested an earlier appointment than 12/15/15.  Pt has been scheduled to see [name] NP today at 11:00. I advised the pt that she may need to be admitted to the hospital due to symptoms and she will bring a bag.

## 2015-12-03 LAB — BRAIN NATRIURETIC PEPTIDE: Brain Natriuretic Peptide: 432.4 pg/mL — ABNORMAL HIGH (ref <100)

## 2015-12-06 ENCOUNTER — Encounter: Payer: Self-pay | Admitting: Nurse Practitioner

## 2015-12-06 ENCOUNTER — Ambulatory Visit (INDEPENDENT_AMBULATORY_CARE_PROVIDER_SITE_OTHER): Payer: Commercial Managed Care - HMO | Admitting: Nurse Practitioner

## 2015-12-06 VITALS — BP 110/62 | HR 63 | Ht 59.0 in | Wt 190.8 lb

## 2015-12-06 DIAGNOSIS — I5042 Chronic combined systolic (congestive) and diastolic (congestive) heart failure: Secondary | ICD-10-CM | POA: Diagnosis not present

## 2015-12-06 DIAGNOSIS — I10 Essential (primary) hypertension: Secondary | ICD-10-CM

## 2015-12-06 DIAGNOSIS — I5032 Chronic diastolic (congestive) heart failure: Secondary | ICD-10-CM | POA: Diagnosis not present

## 2015-12-06 DIAGNOSIS — I480 Paroxysmal atrial fibrillation: Secondary | ICD-10-CM

## 2015-12-06 LAB — CBC
HCT: 32.6 % — ABNORMAL LOW (ref 35.0–45.0)
Hemoglobin: 11 g/dL — ABNORMAL LOW (ref 11.7–15.5)
MCH: 29.5 pg (ref 27.0–33.0)
MCHC: 33.7 g/dL (ref 32.0–36.0)
MCV: 87.4 fL (ref 80.0–100.0)
MPV: 9.7 fL (ref 7.5–12.5)
Platelets: 59 10*3/uL — ABNORMAL LOW (ref 140–400)
RBC: 3.73 MIL/uL — ABNORMAL LOW (ref 3.80–5.10)
RDW: 16.4 % — ABNORMAL HIGH (ref 11.0–15.0)
WBC: 4.8 10*3/uL (ref 3.8–10.8)

## 2015-12-06 LAB — BASIC METABOLIC PANEL
BUN: 58 mg/dL — ABNORMAL HIGH (ref 7–25)
CO2: 34 mmol/L — ABNORMAL HIGH (ref 20–31)
Calcium: 9.1 mg/dL (ref 8.6–10.4)
Chloride: 94 mmol/L — ABNORMAL LOW (ref 98–110)
Creat: 2.36 mg/dL — ABNORMAL HIGH (ref 0.50–0.99)
Glucose, Bld: 112 mg/dL — ABNORMAL HIGH (ref 65–99)
Potassium: 3.1 mmol/L — ABNORMAL LOW (ref 3.5–5.3)
Sodium: 139 mmol/L (ref 135–146)

## 2015-12-06 LAB — BRAIN NATRIURETIC PEPTIDE: Brain Natriuretic Peptide: 447.3 pg/mL — ABNORMAL HIGH (ref <100)

## 2015-12-06 MED ORDER — POTASSIUM CHLORIDE CRYS ER 20 MEQ PO TBCR: 40.0000 meq | EXTENDED_RELEASE_TABLET | Freq: Two times a day (BID) | ORAL | 11 refills | Status: DC

## 2015-12-06 MED ORDER — TORSEMIDE 20 MG PO TABS: 20.0000 mg | ORAL_TABLET | Freq: Two times a day (BID) | ORAL | 11 refills | Status: DC

## 2015-12-06 NOTE — Patient Instructions (Addendum)
We will be checking the following labs today - BMET, BNP and CBC   Medication Instructions:    Continue with your current medicines. BUT   No more Zaroxolyn  STOP Norvasc (Amlodpiine)  Get back on your iron tablets - pick up a bottle of ferrous sulfate 324 mg - take one a day  Stay on the Demedex twice a day  Stay on the 3 pills of potassium twice a day    Testing/Procedures To Be Arranged:  N/A  Follow-Up:   See me in a week  We may need to refer you back to hematology  We may need to refer you to nephrology (kidney doctor)    Other Special Instructions:   Not to return to work - I have given you a letter  Continue to weigh daily and restrict your salt.    If you need a refill on your cardiac medications before your next appointment, please call your pharmacy.   Call the [facility] office at [phone] if you have any questions, problems or concerns.

## 2015-12-06 NOTE — Progress Notes (Signed)
CARDIOLOGY OFFICE NOTE  Date:  12/06/2015    [name] Date of Birth: 08-Oct-1950 Medical Record #[id number]  PCP:  Londell Moh, MD  Cardiologist:  Mayford Knife  Chief Complaint  Patient presents with  . Congestive Heart Failure  . Cardiomyopathy    4 day check - seen for Dr. Mayford Knife    History of Present Illness: DIANNAH RINDFLEISCH is a 65 y.o. female who presents today for a 4 day check. Seen for Dr. Mayford Knife.   She has a history of rheumatic heart disease with previous mechanical mitral valve replacement on chronic warfarin therapy. She underwent TAVR 09/28/2014. Other issues include hepatic cirrhosis, chronic thrombocytopenia, and splenomegaly. Her platelet count has been ranging around 50,000. She has not had any recent bleeding complications.  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 She saw Dr. Excell Seltzer back in April and was volume overloaded. Her Demedex was increased and given several doses of metolazone. Echo at that time was obtained - right ventricle dilating. PAP elevated as well.   She was last seen back in June and felt to be doing ok. Weight was back down with diuresis.   I saw her Friday after calling here with significant weight gain - was up 16 pounds. More short of breath. Gave her 3 days of Zaroxolyn.   Comes in today. Here with her friend again. She says she is "a little better". Weight is down 9 pounds. She still feels tired, short winded and a little lightheaded. No chest pain. No syncope. No active bleeding that she is aware of. She has never had a colonoscopy - says she refuses to have one. For some reason she is no longer taking her iron - not really clear why she is not taking. Says she is taking all of her other medicines.    Past Medical History:  Diagnosis Date  . Anemia   . Aortic valve stenosis    moderate aortic valve stenosis with moderate aortic insufficiency, stable  MVR, trivial PR, mild TR, severe LAE, grade II-III diastolic dysfunction,mild pulmonary HTN , EF 50% by echo 12/2012  . Atypical atrial flutter (HCC)    s/p DCCV  . Chronic combined systolic (congestive) and diastolic (congestive) heart failure (HCC)   . History of blood transfusion 09/2014   "during hospitalization"  . HTN (hypertension)   . Hypothyroidism   . 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 Obesity (BMI 30-39.9) 07/08/2014  . Paroxysmal atrial fibrillation (HCC)   . Paroxysmal atrial tachycardia (HCC)    s/p DCCV  . Pneumonia ~ 2014; 05/2014  . Portal hypertension with esophageal varices (HCC) 07/14/2014  . Pulmonary HTN (HCC)    severe by echo 07/2015  . Rheumatic fever   . S/P mitral valve replacement with metallic valve 04/08/1991   31 mm St Jude bileaflet mechanical valve placed for rheumatic mitral stenosis  . 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 Splenomegaly 07/14/2014  . Subdural hematoma (HCC)    spontaneous  . Temporary low platelet count (HCC)   . Thyrotoxicosis     without mention of goiter or other cause, without mention of thyrotoxic crisis or storm    Past Surgical History:  Procedure Laterality Date  . APPLICATION OF WOUND VAC Left 10/20/2014  Procedure: APPLICATION OF WOUND VAC;  Surgeon: [name], MD;  Location: [facility] OR;  Service: Thoracic;  Laterality: Left;  . BRAIN SURGERY    . CARDIAC CATHETERIZATION     06/2014  . CARDIAC VALVE REPLACEMENT    . CARDIOVERSION N/A 02/01/2015   Procedure: CARDIOVERSION;  Surgeon: Quintella Reichert, MD;  Location: [facility];  Service: Cardiovascular;  Laterality: N/A;  . LAPAROSCOPIC CHOLECYSTECTOMY  05-30-90  . LEFT AND RIGHT HEART CATHETERIZATION WITH CORONARY ANGIOGRAM N/A 06/28/2014   Procedure: LEFT AND RIGHT HEART CATHETERIZATION WITH CORONARY ANGIOGRAM;   Surgeon: Tonny Bollman, MD; Normal coronaries, MVR OK, severe AS, elevated R heart pressures    . MITRAL VALVE REPLACEMENT  04/08/1991   31mm St Jude mechanical valve - Dr Andrey Campanile  . MULTIPLE EXTRACTIONS WITH ALVEOLOPLASTY N/A 08/05/2014   Procedure: Extraction of tooth #'s 3,4,5,7,8,9,10,11,12,13,17,20,21,22,23,24,25,26,27, 28,29, and 32 with alveoloplasty;  Surgeon: Charlynne Pander, DDS;  Location: [facility] OR;  Service: Oral Surgery;  Laterality: N/A;  . SUBDURAL HEMATOMA EVACUATION VIA CRANIOTOMY  2000   Dr Dutch Quint  . TEE WITHOUT CARDIOVERSION N/A 09/28/2014   Procedure: TRANSESOPHAGEAL ECHOCARDIOGRAM (TEE);  Surgeon: Tonny Bollman, MD;  Location: [facility] OR;  Service: Open Heart Surgery;  Laterality: N/A;  . TONSILLECTOMY    . TOTAL ABDOMINAL HYSTERECTOMY  07-23-1994  . TRANSCATHETER AORTIC VALVE REPLACEMENT, TRANSFEMORAL N/A 09/28/2014   Procedure: TRANSCATHETER AORTIC VALVE REPLACEMENT, TRANSFEMORAL;  Surgeon: Tonny Bollman, MD;  Location[facility] OR;  Service: Open Heart Surgery;  Laterality: N/A;  . WOUND EXPLORATION Left 10/20/2014   Procedure: WOUND EXPLORATION;  Surgeon: [name], MD;  Location: [facility] OR;  Service: Thoracic;  Laterality: Left;     Medications: Current Outpatient Prescriptions  Medication Sig Dispense Refill  . amiodarone (PACERONE) 200 MG tablet Take 1 tablet (200 mg total) by mouth daily. 90 tablet 2  . aspirin EC 81 MG tablet Take 1 tablet (81 mg total) by mouth daily.    . Calcium Carb-Cholecalciferol (CALCIUM 1000 + D PO) Take 1,000 mg by mouth daily with lunch.     . cyclobenzaprine (FLEXERIL) 10 MG tablet Take 5 mg by mouth at bedtime.     . ferrous sulfate 325 (65 FE) MG tablet TAKE ONE TABLET BY MOUTH ONCE DAILY WITH BREAKFAST 30 tablet 6  . KLOR-CON M20 20 MEQ tablet TAKE TWO TABLETS BY MOUTH TWICE DAILY 120 tablet 11  . levothyroxine (SYNTHROID, LEVOTHROID) 150 MCG tablet Take 150 mcg by mouth daily before breakfast.     . metoprolol succinate (TOPROL-XL) 50 MG 24  hr tablet TAKE 1 TABLET BY MOUTH ONCE DAILY WITH A MEAL OR IMMEDIATELY FOLLOWING A MEAL 30 tablet 11  . torsemide (DEMADEX) 20 MG tablet Take 1 tablet (20 mg total) by mouth 2 (two) times daily. 30 tablet 11  . warfarin (COUMADIN) 5 MG tablet Take 0.5 tablets (2.5 mg total) by mouth at bedtime. Or as directed.     No current facility-administered medications for this visit.     Allergies: Allergies  Allergen Reactions  . Adhesive [Tape] Itching    Adhesive tape and backing (pls use elastic bandages with no adhesive)  . Penicillins Rash  . Sulfa Antibiotics Rash    Social History: The patient  reports that she has never smoked. She has never used smokeless tobacco. She reports that she does not drink alcohol or use drugs.   Family History: The patient's family history includes CAD in her father and mother; Cirrhosis in her brother; Heart  attack in her sister; Hypertension in her father.   Review of Systems: Please see the history of present illness.   Otherwise, the review of systems is positive for none.   All other systems are reviewed and negative.   Physical Exam: VS:  BP 110/62   Pulse 63   Ht 4\' 11"  (1.499 m)   Wt 190 lb 12.8 oz (86.5 kg)   SpO2 98% Comment: at rest  BMI 38.54 kg/m  .  BMI Body mass index is 38.54 kg/m.  Wt Readings from Last 3 Encounters:  12/06/15 190 lb 12.8 oz (86.5 kg)  12/02/15 199 lb 12.8 oz (90.6 kg)  09/15/15 183 lb 14.4 oz (83.4 kg)    General: Pleasant. Affect a little flat. She looks chronically ill but alert and in no acute distress.  Color looks a little better. Weight is down 9 pounds.  HEENT: Normal.  Neck: Supple, no JVD, carotid bruits, or masses noted.  Cardiac:Regular rate and rhythm. Valve crisp.  No edema.  Respiratory:  Lungs are clear to auscultation bilaterally with normal work of breathing.  GI: Soft and nontender.  MS: No deformity or atrophy. Gait and ROM intact.  Skin: Warm and dry. Color is normal.  Neuro:  Strength  and sensation are intact and no gross focal deficits noted.  Psych: Alert, appropriate and with normal affect.   LABORATORY DATA:  EKG:  EKG is not ordered today.  Lab Results  Component Value Date   WBC 3.9 12/02/2015   HGB 10.0 (L) 12/02/2015   HCT 30.2 (L) 12/02/2015   PLT 60 (L) 12/02/2015   GLUCOSE 124 (H) 12/02/2015   ALT 22 12/02/2015   AST 42 (H) 12/02/2015   NA 135 12/02/2015   K 3.3 (L) 12/02/2015   CL 97 (L) 12/02/2015   CREATININE 2.50 (H) 12/02/2015   BUN 56 (H) 12/02/2015   CO2 28 12/02/2015   TSH 1.96 12/02/2015   INR 2.43 (H) 01/31/2015   HGBA1C 6.2 (H) 09/24/2014    BNP (last 3 results)  Recent Labs  07/08/15 1635 07/25/15 1432 12/02/15 1144  BNP 388.9* 194.0* 432.4*    ProBNP (last 3 results) No results for input(s): PROBNP in the last 8760 hours.   Other Studies Reviewed Today:  Echo Study Conclusions from April 2017  - Left ventricle: LVEF is approximately 45% with septal, posterior and inferior hypokinesis. The cavity size was normal. Wall thickness was normal. - Aortic valve: AV prosthesis is difficult to see well Peak nad mean gradients through the valve are 18 and 11 mm Hg respectively. - Mitral valve: MV prosthesiss is difficult to see well Peak and mean gradients through the valve arer 12 and 4 mm Hg respectively. There was mild regurgitation. - Left atrium: The atrium was severely dilated. - Right ventricle: The cavity size was mildly dilated. - Right atrium: The atrium was severely dilated. - Tricuspid valve: There was moderate regurgitation. - Pulmonary arteries: PA peak pressure: 71 mm Hg (S).  Assessment/Plan:  1. Acute on chronic combined systolic and diastolic HF - she has lost 9 pounds - since Friday. Still feels bad and I suspect this multifactorial. Needs repeat lab today. No more Zaroxolyn. Will stay on the increased dose of Demedex and potassium for now. Last echo with RV dilation. Her options may be  limiting going forward.   2. Rheumatic heart disease with prior MVR and s/p TAVR - echo from April noted.   3. Chronic amiodarone therapy - in NSR  4. HTN - BP little soft - will stop the Norvasc  5. Prior atypical flutter with past cardioversion - remains in NSR  6. Thrombocytopenia - lab today  7. Anemia - this may be driving her heart failure symptoms - needs to get back on her iron. May need to consider going back to hematology  8. CKD - may need referral to Nephrology  I have placed her out of work. She has what appears to me as multi system organ failure - I suspect her options are quite limited.   Current medicines are reviewed with the patient today.  The patient does not have concerns regarding medicines other than what has been noted above.  The following changes have been made:  See above.  Labs/ tests ordered today include:    Orders Placed This Encounter  Procedures  . Brain natriuretic peptide  . Basic metabolic panel  . CBC     Disposition:   FU with me in one week.   Patient is agreeable to this plan and will call if any problems develop in the interim.   Signed: Rosalio Macadamia, RN, ANP-C 12/06/2015 12:17 PM  [facility] [address] [place], [place]  [address] Phone: [phone] Fax: [phone]

## 2015-12-13 ENCOUNTER — Encounter (INDEPENDENT_AMBULATORY_CARE_PROVIDER_SITE_OTHER): Payer: Self-pay

## 2015-12-13 ENCOUNTER — Ambulatory Visit (INDEPENDENT_AMBULATORY_CARE_PROVIDER_SITE_OTHER): Payer: Commercial Managed Care - HMO | Admitting: Nurse Practitioner

## 2015-12-13 ENCOUNTER — Encounter: Payer: Self-pay | Admitting: Nurse Practitioner

## 2015-12-13 VITALS — BP 120/64 | HR 61 | Ht 59.0 in | Wt 192.8 lb

## 2015-12-13 DIAGNOSIS — I5032 Chronic diastolic (congestive) heart failure: Secondary | ICD-10-CM | POA: Diagnosis not present

## 2015-12-13 DIAGNOSIS — I10 Essential (primary) hypertension: Secondary | ICD-10-CM

## 2015-12-13 DIAGNOSIS — I480 Paroxysmal atrial fibrillation: Secondary | ICD-10-CM

## 2015-12-13 DIAGNOSIS — I5042 Chronic combined systolic (congestive) and diastolic (congestive) heart failure: Secondary | ICD-10-CM | POA: Diagnosis not present

## 2015-12-13 DIAGNOSIS — I38 Endocarditis, valve unspecified: Secondary | ICD-10-CM

## 2015-12-13 LAB — BASIC METABOLIC PANEL
BUN: 38 mg/dL — ABNORMAL HIGH (ref 7–25)
CO2: 29 mmol/L (ref 20–31)
Calcium: 8.6 mg/dL (ref 8.6–10.4)
Chloride: 100 mmol/L (ref 98–110)
Creat: 1.86 mg/dL — ABNORMAL HIGH (ref 0.50–0.99)
Glucose, Bld: 123 mg/dL — ABNORMAL HIGH (ref 65–99)
Potassium: 3.8 mmol/L (ref 3.5–5.3)
Sodium: 140 mmol/L (ref 135–146)

## 2015-12-13 LAB — BRAIN NATRIURETIC PEPTIDE: Brain Natriuretic Peptide: 515.6 pg/mL — ABNORMAL HIGH (ref <100)

## 2015-12-13 NOTE — Patient Instructions (Addendum)
We will be checking the following labs today - BMET & BNP   Medication Instructions:    Continue with your current medicines.     Testing/Procedures To Be Arranged:  N/A  Follow-Up:   See me in about 10 to 14 days - will pick a day that Dr. Mayford Knifeurner is here.     Other Special Instructions:   I am giving you a letter to return to work.     If you need a refill on your cardiac medications before your next appointment, please call your pharmacy.   Call the [facility] office at [phone] if you have any questions, problems or concerns.

## 2015-12-13 NOTE — Progress Notes (Signed)
CARDIOLOGY OFFICE NOTE  Date:  12/13/2015    Jamie Howell Date of Birth: 08/08/1950 Medical Record #[id number]  PCP:  Londell MohPHARR,[name], MD  Cardiologist:  Reita ChardGerhardt & Turner  Chief Complaint  Patient presents with  . Congestive Heart Failure    1 week check - seen for Dr. Mayford Knifeurner    History of Present Illness: Jamie Howell is a 65 y.o. female who presents today for a one week check. Seen for Dr. Mayford Knifeurner.   She has a history of rheumatic heart disease with previous mechanical mitral valve replacement on chronic warfarin therapy. She underwent TAVR 09/28/2014. Other issues include hepatic cirrhosis, chronic thrombocytopenia, and splenomegaly. Her platelet count has been ranging around 50,000. She has not had any recent bleeding complications. Coumadin monitored by PCP.   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 She saw Dr. Excell Seltzerooper back in April as a work in and was volume overloaded. Her Demedex was increased and she was given several doses of metolazone. Echo at that time was obtained - right ventricle dilating. PAP elevated as well.   She was last seen back in June by Dr. Mayford Knifeurner and felt to be doing ok. Weight was back down with diuresis.   I saw her at the beginning of the month after calling here with significant weight gain - was up 16 pounds. More short of breath. Gave her 3 days of Zaroxolyn. Seen back a few days later and she was a little bit improved. Weight was down 9 pounds. Noted to be more anemic - she was not taking her iron - refused colonoscopy in the past. Restarted her iron and left her on the BID dosing of Demadex. We discussed stopping work - she was trying to look at retiring from [employer]. I stopped her Norvasc due to BP being too soft and she was probably having dizziness as a result.   Comes in today. Here with her friend again. She has put in for her retirement - this will  not happen until the end of next month. I placed her out of work at last week's visit. She wants to return to work - needs the money and does not have enough time built up to stay out. She may be a bit better. Weight is up 2 pounds. Swelling seems better. Has not done very much since last visit here but seems a little more rested. No chest pain. Not as dizzy. BP has come up some.   Past Medical History:  Diagnosis Date  . Anemia   . Aortic valve stenosis    moderate aortic valve stenosis with moderate aortic insufficiency, stable MVR, trivial PR, mild TR, severe LAE, grade II-III diastolic dysfunction,mild pulmonary HTN , EF 50% by echo 12/2012  . Atypical atrial flutter (HCC)    s/p DCCV  . Chronic combined systolic (congestive) and diastolic (congestive) heart failure (HCC)   . History of blood transfusion 09/2014   "during hospitalization"  . HTN (hypertension)   . Hypothyroidism   . 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 Obesity (BMI 30-39.9) 07/08/2014  . Paroxysmal atrial fibrillation (HCC)   . Paroxysmal atrial tachycardia (HCC)    s/p DCCV  . Pneumonia ~ 2014; 05/2014  . Portal hypertension with esophageal varices (HCC) 07/14/2014  . Pulmonary HTN (HCC)    severe by echo 07/2015  . Rheumatic  fever   . S/P mitral valve replacement with metallic valve 04/08/1991   31 mm St Jude bileaflet mechanical valve placed for rheumatic mitral stenosis  . 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 Splenomegaly 07/14/2014  . Subdural hematoma (HCC)    spontaneous  . Temporary low platelet count (HCC)   . Thyrotoxicosis     without mention of goiter or other cause, without mention of thyrotoxic crisis or storm    Past Surgical History:  Procedure Laterality Date  . APPLICATION OF WOUND VAC Left 10/20/2014   Procedure: APPLICATION OF WOUND VAC;   Surgeon: [name], MD;  Location: [facility] OR;  Service: Thoracic;  Laterality: Left;  . BRAIN SURGERY    . CARDIAC CATHETERIZATION     06/2014  . CARDIAC VALVE REPLACEMENT    . CARDIOVERSION N/A 02/01/2015   Procedure: CARDIOVERSION;  Surgeon: Quintella Reichert, MD;  Location: [facility];  Service: Cardiovascular;  Laterality: N/A;  . LAPAROSCOPIC CHOLECYSTECTOMY  05-30-90  . LEFT AND RIGHT HEART CATHETERIZATION WITH CORONARY ANGIOGRAM N/A 06/28/2014   Procedure: LEFT AND RIGHT HEART CATHETERIZATION WITH CORONARY ANGIOGRAM;  Surgeon: Tonny Bollman, MD; Normal coronaries, MVR OK, severe AS, elevated R heart pressures    . MITRAL VALVE REPLACEMENT  04/08/1991   31mm St Jude mechanical valve - Dr Andrey Campanile  . MULTIPLE EXTRACTIONS WITH ALVEOLOPLASTY N/A 08/05/2014   Procedure: Extraction of tooth #'s 3,4,5,7,8,9,10,11,12,13,17,20,21,22,23,24,25,26,27, 28,29, and 32 with alveoloplasty;  Surgeon: Charlynne Pander, DDS;  Location: [facility] OR;  Service: Oral Surgery;  Laterality: N/A;  . SUBDURAL HEMATOMA EVACUATION VIA CRANIOTOMY  2000   Dr Dutch Quint  . TEE WITHOUT CARDIOVERSION N/A 09/28/2014   Procedure: TRANSESOPHAGEAL ECHOCARDIOGRAM (TEE);  Surgeon: Tonny Bollman, MD;  Location: [facility] OR;  Service: Open Heart Surgery;  Laterality: N/A;  . TONSILLECTOMY    . TOTAL ABDOMINAL HYSTERECTOMY  07-23-1994  . TRANSCATHETER AORTIC VALVE REPLACEMENT, TRANSFEMORAL N/A 09/28/2014   Procedure: TRANSCATHETER AORTIC VALVE REPLACEMENT, TRANSFEMORAL;  Surgeon: Tonny Bollman, MD;  Location[facility] OR;  Service: Open Heart Surgery;  Laterality: N/A;  . WOUND EXPLORATION Left 10/20/2014   Procedure: WOUND EXPLORATION;  Surgeon: [name], MD;  Location: [facility] OR;  Service: Thoracic;  Laterality: Left;     Medications: Current Outpatient Prescriptions  Medication Sig Dispense Refill  . amiodarone (PACERONE) 200 MG tablet Take 1 tablet (200 mg total) by mouth daily. 90 tablet 2  . aspirin EC 81 MG tablet Take 1 tablet (81 mg total)  by mouth daily.    . Calcium Carb-Cholecalciferol (CALCIUM 1000 + D PO) Take 1,000 mg by mouth daily with lunch.     . cyclobenzaprine (FLEXERIL) 10 MG tablet Take 5 mg by mouth at bedtime.     . ferrous sulfate 325 (65 FE) MG tablet TAKE ONE TABLET BY MOUTH ONCE DAILY WITH BREAKFAST 30 tablet 6  . levothyroxine (SYNTHROID, LEVOTHROID) 150 MCG tablet Take 150 mcg by mouth daily before breakfast.     . metoprolol succinate (TOPROL-XL) 50 MG 24 hr tablet TAKE 1 TABLET BY MOUTH ONCE DAILY WITH A MEAL OR IMMEDIATELY FOLLOWING A MEAL 30 tablet 11  . potassium chloride SA (KLOR-CON M20) 20 MEQ tablet Take 2 tablets (40 mEq total) by mouth 2 (two) times daily. Take 3 tablets ( total) by mouth 2 times a day 120 tablet 11  . torsemide (DEMADEX) 20 MG tablet Take 1  tablet (20 mg total) by mouth 2 (two) times daily. 30 tablet 11  . warfarin (COUMADIN) 5 MG tablet Take 0.5 tablets (2.5 mg total) by mouth at bedtime. Or as directed.     No current facility-administered medications for this visit.     Allergies: Allergies  Allergen Reactions  . Adhesive [Tape] Itching    Adhesive tape and backing (pls use elastic bandages with no adhesive)  . Penicillins Rash  . Sulfa Antibiotics Rash    Social History: The patient  reports that she has never smoked. She has never used smokeless tobacco. She reports that she does not drink alcohol or use drugs.   Family History: The patient's family history includes CAD in her father and mother; Cirrhosis in her brother; Heart attack in her sister; Hypertension in her father.   Review of Systems: Please see the history of present illness.   Otherwise, the review of systems is positive for none.   All other systems are reviewed and negative.   Physical Exam: VS:  BP 120/64   Pulse 61   Ht 4\' 11"  (1.499 m)   Wt 192 lb 12.8 oz (87.5 kg)   SpO2 100% Comment: at rest  BMI 38.94 kg/m  .  BMI Body mass index is 38.94 kg/m.  Wt Readings from Last 3  Encounters:  12/13/15 192 lb 12.8 oz (87.5 kg)  12/06/15 190 lb 12.8 oz (86.5 kg)  12/02/15 199 lb 12.8 oz (90.6 kg)    General: Chronically ill but alert and in no acute distress.  Her weight is up 2 pounds.  HEENT: Normal.  Neck: Supple, no JVD, carotid bruits, or masses noted.  Cardiac: Regular rate and rhythm. No murmurs, rubs, or gallops. No edema.  Respiratory:  Lungs are clear to auscultation bilaterally with normal work of breathing.  GI: Soft and nontender.  MS: No deformity or atrophy. Gait and ROM intact.  Skin: Warm and dry. Color is normal.  Neuro:  Strength and sensation are intact and no gross focal deficits noted.  Psych: Alert, appropriate and with normal affect.   LABORATORY DATA:  EKG:  EKG is not ordered today.  Lab Results  Component Value Date   WBC 4.8 12/06/2015   HGB 11.0 (L) 12/06/2015   HCT 32.6 (L) 12/06/2015   PLT 59 (L) 12/06/2015   GLUCOSE 112 (H) 12/06/2015   ALT 22 12/02/2015   AST 42 (H) 12/02/2015   NA 139 12/06/2015   K 3.1 (L) 12/06/2015   CL 94 (L) 12/06/2015   CREATININE 2.36 (H) 12/06/2015   BUN 58 (H) 12/06/2015   CO2 34 (H) 12/06/2015   TSH 1.96 12/02/2015   INR 2.43 (H) 01/31/2015   HGBA1C 6.2 (H) 09/24/2014    BNP (last 3 results)  Recent Labs  07/25/15 1432 12/02/15 1144 12/06/15 1220  BNP 194.0* 432.4* 447.3*    ProBNP (last 3 results) No results for input(s): PROBNP in the last 8760 hours.   Other Studies Reviewed Today:  Echo Study Conclusions from April 2017  - Left ventricle: LVEF is approximately 45% with septal, posterior and inferior hypokinesis. The cavity size was normal. Wall thickness was normal. - Aortic valve: AV prosthesis is difficult to see well Peak nad mean gradients through the valve are 18 and 11 mm Hg respectively. - Mitral valve: MV prosthesiss is difficult to see well Peak and mean gradients through the valve arer 12 and 4 mm Hg respectively. There was mild  regurgitation. - Left atrium:  The atrium was severely dilated. - Right ventricle: The cavity size was mildly dilated. - Right atrium: The atrium was severely dilated. - Tricuspid valve: There was moderate regurgitation. - Pulmonary arteries: PA peak pressure: 71 mm Hg (S).  Assessment/Plan:  1. Acute on chronic combined systolic and diastolic HF - she lost 9 pounds initially but now back up 2 pounds.  Last echo with RV dilation. Her options may be limiting going forward. She seems a bit better but I think her overall prognosis is quite tenuous at best. Will leave her on the increased dose of diuretic. Recheck her lab today. She needs to return to work - I don't think she will do well - but I understand why she needs to go. Will see how she does.   2. Rheumatic heart disease with prior MVR and s/p TAVR - echo from April noted.   3. Chronic amiodarone therapy - in NSR   4. HTN - BP improved with stopping the Norvasc  5. Prior atypical flutter with past cardioversion - remains in NSR  6. Thrombocytopenia - recent lab noted  7. Anemia - this may be driving her heart failure symptoms - she is back on her iron. May need to consider going back to hematology going forward  8. CKD - may need referral to Nephrology  Current medicines are reviewed with the patient today.  The patient does not have concerns regarding medicines other than what has been noted above.  The following changes have been made:  See above.  Labs/ tests ordered today include:    Orders Placed This Encounter  Procedures  . Brain natriuretic peptide  . Basic metabolic panel     Disposition:   FU with me on a day that Dr. Mayford Knife is here in about 10 to 14 days. Overall prognosis tenuous at best.   Patient is agreeable to this plan and will call if any problems develop in the interim.   Signed: Rosalio Macadamia, RN, ANP-C 12/13/2015 11:03 AM  [facility] [address]  [address] [place], [place]  [address] Phone: [phone] Fax: [phone]

## 2015-12-15 ENCOUNTER — Ambulatory Visit: Payer: Commercial Managed Care - HMO | Admitting: Physician Assistant

## 2015-12-26 ENCOUNTER — Encounter: Payer: Self-pay | Admitting: Nurse Practitioner

## 2015-12-26 ENCOUNTER — Ambulatory Visit (INDEPENDENT_AMBULATORY_CARE_PROVIDER_SITE_OTHER): Payer: Commercial Managed Care - HMO | Admitting: Nurse Practitioner

## 2015-12-26 VITALS — BP 118/60 | HR 65 | Ht 59.0 in | Wt 195.8 lb

## 2015-12-26 DIAGNOSIS — I059 Rheumatic mitral valve disease, unspecified: Secondary | ICD-10-CM

## 2015-12-26 DIAGNOSIS — I38 Endocarditis, valve unspecified: Secondary | ICD-10-CM

## 2015-12-26 DIAGNOSIS — I5032 Chronic diastolic (congestive) heart failure: Secondary | ICD-10-CM | POA: Diagnosis not present

## 2015-12-26 DIAGNOSIS — I480 Paroxysmal atrial fibrillation: Secondary | ICD-10-CM | POA: Diagnosis not present

## 2015-12-26 DIAGNOSIS — I10 Essential (primary) hypertension: Secondary | ICD-10-CM

## 2015-12-26 DIAGNOSIS — I5042 Chronic combined systolic (congestive) and diastolic (congestive) heart failure: Secondary | ICD-10-CM | POA: Diagnosis not present

## 2015-12-26 LAB — CBC
HCT: 32.6 % — ABNORMAL LOW (ref 35.0–45.0)
Hemoglobin: 10.5 g/dL — ABNORMAL LOW (ref 11.7–15.5)
MCH: 29.5 pg (ref 27.0–33.0)
MCHC: 32.2 g/dL (ref 32.0–36.0)
MCV: 91.6 fL (ref 80.0–100.0)
MPV: 10.8 fL (ref 7.5–12.5)
Platelets: 54 10*3/uL — ABNORMAL LOW (ref 140–400)
RBC: 3.56 MIL/uL — ABNORMAL LOW (ref 3.80–5.10)
RDW: 19.4 % — ABNORMAL HIGH (ref 11.0–15.0)
WBC: 3.5 10*3/uL — ABNORMAL LOW (ref 3.8–10.8)

## 2015-12-26 LAB — HEPATIC FUNCTION PANEL
ALT: 24 U/L (ref 6–29)
AST: 44 U/L — ABNORMAL HIGH (ref 10–35)
Albumin: 3.3 g/dL — ABNORMAL LOW (ref 3.6–5.1)
Alkaline Phosphatase: 111 U/L (ref 33–130)
Bilirubin, Direct: 1.3 mg/dL — ABNORMAL HIGH (ref <=0.2)
Indirect Bilirubin: 2 mg/dL — ABNORMAL HIGH (ref 0.2–1.2)
Total Bilirubin: 3.3 mg/dL — ABNORMAL HIGH (ref 0.2–1.2)
Total Protein: 6.5 g/dL (ref 6.1–8.1)

## 2015-12-26 LAB — BASIC METABOLIC PANEL
BUN: 34 mg/dL — ABNORMAL HIGH (ref 7–25)
CO2: 30 mmol/L (ref 20–31)
Calcium: 8.5 mg/dL — ABNORMAL LOW (ref 8.6–10.4)
Chloride: 100 mmol/L (ref 98–110)
Creat: 1.93 mg/dL — ABNORMAL HIGH (ref 0.50–0.99)
Glucose, Bld: 144 mg/dL — ABNORMAL HIGH (ref 65–99)
Potassium: 3.9 mmol/L (ref 3.5–5.3)
Sodium: 139 mmol/L (ref 135–146)

## 2015-12-26 LAB — BRAIN NATRIURETIC PEPTIDE: Brain Natriuretic Peptide: 496.9 pg/mL — ABNORMAL HIGH (ref <100)

## 2015-12-26 MED ORDER — METOLAZONE 2.5 MG PO TABS: 2.5000 mg | ORAL_TABLET | ORAL | 3 refills | Status: DC

## 2015-12-26 NOTE — Patient Instructions (Addendum)
We will be checking the following labs today - BMET, CBC, BNP, HPF  BMET in one week   Medication Instructions:    Continue with your current medicines. BUT  I am adding back Zaroxolyn 2.5 mg to take only twice a week - Tuesdays and Saturdays - about 30 minutes prior to do of Demadex.     Testing/Procedures To Be Arranged:  N/A  Follow-Up:   See Dr. Mayford Knifeurner as planned in October  Referral to Nephrology  Referral to CHF clinic    Other Special Instructions:   N/A    If you need a refill on your cardiac medications before your next appointment, please call your pharmacy.   Call the [facility] office at [phone] if you have any questions, problems or concerns.

## 2015-12-26 NOTE — Progress Notes (Signed)
CARDIOLOGY OFFICE NOTE  Date:  12/26/2015    Jamie Howell Date of Birth: [birth year] Medical Record #[id number]  PCP:  Londell Moh, MD  Cardiologist:  Reita Chard  Chief Complaint  Patient presents with  . Congestive Heart Failure    10 day check - seen for Dr. Mayford Knife    History of Present Illness: Jamie Howell is a 65 y.o. female who presents today for a follow up visit. Seen for Dr. Mayford Knife.   She has a history of rheumatic heart disease with previous mechanical mitral valve replacement on chronic warfarin therapy. She underwent TAVR 09/28/2014. Other issues include hepatic cirrhosis, chronic thrombocytopenia, and splenomegaly. Her platelet count has been ranging around 50,000. She has not had any recent bleeding complications. Coumadin monitored by PCP.   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 She saw Dr. Excell Seltzer back in April as a work in and was volume overloaded. Her Demedex was increased and she was given several doses of metolazone. Echo at that time was obtained - right ventricle dilating. PAP elevated as well.   She was last seen back in June by Dr. Mayford Knife and felt to be doing ok. Weight was back down with diuresis.   I saw her at the beginning of the month after calling here with significant weight gain - was up 16 pounds. More short of breath. Gave her 3 days of Zaroxolyn. Seen back a few days later and she was a little bit improved. Weight was down 9 pounds. Noted to be more anemic - she was not taking her iron - refused colonoscopy in the past. Restarted her iron and left her on the BID dosing of Demadex. We discussed stopping work - she was trying to look at retiring from [employer]. I stopped her Norvasc due to BP being too soft and she was probably having dizziness as a result.   Seen about 10 days ago - had to return to work - retiring October 31. Overall prognosis  tenuous.   Comes in today. Here with her friend again. 37 days til retirement "but who is counting" she says. Good days and bad days. Short of breath with going up steps. Has an upstairs apartment - has to stop and rest. Weight is up 3 more pounds. More swelling. Feels like her breathing is about the same. Not able to really do much at work but she is showing up. Fatigues very easily. INR checked by PCP. No chest pain. Not dizzy. No palpitations.   Past Medical History:  Diagnosis Date  . Anemia   . Aortic valve stenosis    moderate aortic valve stenosis with moderate aortic insufficiency, stable MVR, trivial PR, mild TR, severe LAE, grade II-III diastolic dysfunction,mild pulmonary HTN , EF 50% by echo 12/2012  . Atypical atrial flutter (HCC)    s/p DCCV  . Chronic combined systolic (congestive) and diastolic (congestive) heart failure (HCC)   . History of blood transfusion 09/2014   "during hospitalization"  . HTN (hypertension)   . Hypothyroidism   . 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 Obesity (BMI 30-39.9) 07/08/2014  . Paroxysmal atrial fibrillation (HCC)   . Paroxysmal atrial tachycardia (HCC)    s/p DCCV  . Pneumonia ~ 2014; 05/2014  . Portal hypertension with esophageal varices (HCC) 07/14/2014  . Pulmonary HTN (HCC)    severe  by echo 07/2015  . Rheumatic fever   . S/P mitral valve replacement with metallic valve 04/08/1991   31 mm St Jude bileaflet mechanical valve placed for rheumatic mitral stenosis  . 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 Splenomegaly 07/14/2014  . Subdural hematoma (HCC)    spontaneous  . Temporary low platelet count (HCC)   . Thyrotoxicosis     without mention of goiter or other cause, without mention of thyrotoxic crisis or storm    Past Surgical History:  Procedure Laterality Date  . APPLICATION  OF WOUND VAC Left 10/20/2014   Procedure: APPLICATION OF WOUND VAC;  Surgeon: [name], MD;  Location: [facility] OR;  Service: Thoracic;  Laterality: Left;  . BRAIN SURGERY    . CARDIAC CATHETERIZATION     06/2014  . CARDIAC VALVE REPLACEMENT    . CARDIOVERSION N/A 02/01/2015   Procedure: CARDIOVERSION;  Surgeon: Quintella Reichert, MD;  Location: [facility] ENDOSCOPY;  Service: Cardiovascular;  Laterality: N/A;  . LAPAROSCOPIC CHOLECYSTECTOMY  05-30-90  . LEFT AND RIGHT HEART CATHETERIZATION WITH CORONARY ANGIOGRAM N/A 06/28/2014   Procedure: LEFT AND RIGHT HEART CATHETERIZATION WITH CORONARY ANGIOGRAM;  Surgeon: Tonny Bollman, MD; Normal coronaries, MVR OK, severe AS, elevated R heart pressures    . MITRAL VALVE REPLACEMENT  04/08/1991   31mm St Jude mechanical valve - Dr Andrey Campanile  . MULTIPLE EXTRACTIONS WITH ALVEOLOPLASTY N/A 08/05/2014   Procedure: Extraction of tooth #'s 3,4,5,7,8,9,10,11,12,13,17,20,21,22,23,24,25,26,27, 28,29, and 32 with alveoloplasty;  Surgeon: Charlynne Pander, DDS;  Location: [facility] OR;  Service: Oral Surgery;  Laterality: N/A;  . SUBDURAL HEMATOMA EVACUATION VIA CRANIOTOMY  2000   Dr Dutch Quint  . TEE WITHOUT CARDIOVERSION N/A 09/28/2014   Procedure: TRANSESOPHAGEAL ECHOCARDIOGRAM (TEE);  Surgeon: Tonny Bollman, MD;  Location: [facility] OR;  Service: Open Heart Surgery;  Laterality: N/A;  . TONSILLECTOMY    . TOTAL ABDOMINAL HYSTERECTOMY  07-23-1994  . TRANSCATHETER AORTIC VALVE REPLACEMENT, TRANSFEMORAL N/A 09/28/2014   Procedure: TRANSCATHETER AORTIC VALVE REPLACEMENT, TRANSFEMORAL;  Surgeon: Tonny Bollman, MD;  Location: [facility] OR;  Service: Open Heart Surgery;  Laterality: N/A;  . WOUND EXPLORATION Left 10/20/2014   Procedure: WOUND EXPLORATION;  Surgeon: [name], MD;  Location: [facility] OR;  Service: Thoracic;  Laterality: Left;     Medications: Current Outpatient Prescriptions  Medication Sig Dispense Refill  . amiodarone (PACERONE) 200 MG tablet Take 1 tablet (200 mg total) by mouth  daily. 90 tablet 2  . aspirin EC 81 MG tablet Take 1 tablet (81 mg total) by mouth daily.    . Calcium Carb-Cholecalciferol (CALCIUM 1000 + D PO) Take 1,000 mg by mouth daily with lunch.     . cyclobenzaprine (FLEXERIL) 10 MG tablet Take 5 mg by mouth at bedtime.     . ferrous sulfate 325 (65 FE) MG tablet TAKE ONE TABLET BY MOUTH ONCE DAILY WITH BREAKFAST 30 tablet 6  . levothyroxine (SYNTHROID, LEVOTHROID) 150 MCG tablet Take 150 mcg by mouth daily before breakfast.     . metoprolol succinate (TOPROL-XL) 50 MG 24 hr tablet TAKE 1 TABLET BY MOUTH ONCE DAILY WITH A MEAL OR IMMEDIATELY FOLLOWING A MEAL 30 tablet 11  . potassium chloride SA (KLOR-CON M20) 20 MEQ tablet Take 2 tablets (40 mEq total) by mouth 2 (two) times daily. Take 3 tablets ( total) by mouth 2 times a day 120 tablet 11  . torsemide (  DEMADEX) 20 MG tablet Take 1 tablet (20 mg total) by mouth 2 (two) times daily. 30 tablet 11  . warfarin (COUMADIN) 5 MG tablet Take 0.5 tablets (2.5 mg total) by mouth at bedtime. Or as directed.    . metolazone (ZAROXOLYN) 2.5 MG tablet Take 1 tablet (2.5 mg total) by mouth 2 (two) times a week. 10 tablet 3   No current facility-administered medications for this visit.     Allergies: Allergies  Allergen Reactions  . Adhesive [Tape] Itching    Adhesive tape and backing (pls use elastic bandages with no adhesive)  . Penicillins Rash  . Sulfa Antibiotics Rash    Social History: The patient  reports that she has never smoked. She has never used smokeless tobacco. She reports that she does not drink alcohol or use drugs.   Family History: The patient's family history includes CAD in her father and mother; Cirrhosis in her brother; Heart attack in her sister; Hypertension in her father.   Review of Systems: Please see the history of present illness.   Otherwise, the review of systems is positive for none.   All other systems are reviewed and negative.   Physical Exam: VS:  BP 118/60    Pulse 65   Ht 4\' 11"  (1.499 m)   Wt 195 lb 12.8 oz (88.8 kg)   SpO2 93% Comment: at rest  BMI 39.55 kg/m  .  BMI Body mass index is 39.55 kg/m.  Wt Readings from Last 3 Encounters:  12/26/15 195 lb 12.8 oz (88.8 kg)  12/13/15 192 lb 12.8 oz (87.5 kg)  12/06/15 190 lb 12.8 oz (86.5 kg)    General: Pleasant. Chronically ill appearing but alert and in no acute distress.  Color is quite sallow. HEENT: Normal.  Neck: Supple, no JVD, carotid bruits, or masses noted.  Cardiac: Regular rate and rhythm. Valve click noted. Soft outflow murmur. 2+ edema.  Respiratory:  Lungs are fairly clear to auscultation bilaterally with normal work of breathing.  GI: Soft and nontender.  MS: No deformity or atrophy. Gait and ROM intact.  Skin: Warm and dry. Color is sallow Neuro:  Strength and sensation are intact and no gross focal deficits noted.  Psych: Alert, appropriate and with normal affect.   LABORATORY DATA:  EKG:  EKG is not ordered today.  Lab Results  Component Value Date   WBC 4.8 12/06/2015   HGB 11.0 (L) 12/06/2015   HCT 32.6 (L) 12/06/2015   PLT 59 (L) 12/06/2015   GLUCOSE 123 (H) 12/13/2015   ALT 22 12/02/2015   AST 42 (H) 12/02/2015   NA 140 12/13/2015   K 3.8 12/13/2015   CL 100 12/13/2015   CREATININE 1.86 (H) 12/13/2015   BUN 38 (H) 12/13/2015   CO2 29 12/13/2015   TSH 1.96 12/02/2015   INR 2.43 (H) 01/31/2015   HGBA1C 6.2 (H) 09/24/2014    BNP (last 3 results)  Recent Labs  12/02/15 1144 12/06/15 1220 12/13/15 1107  BNP 432.4* 447.3* 515.6*    ProBNP (last 3 results) No results for input(s): PROBNP in the last 8760 hours.   Other Studies Reviewed Today:  Echo Study Conclusions from April 2017  - Left ventricle: LVEF is approximately 45% with septal, posterior and inferior hypokinesis. The cavity size was normal. Wall thickness was normal. - Aortic valve: AV prosthesis is difficult to see well Peak nad mean gradients through the valve are 18  and 11 mm Hg respectively. - Mitral valve: MV prosthesiss  is difficult to see well Peak and mean gradients through the valve arer 12 and 4 mm Hg respectively. There was mild regurgitation. - Left atrium: The atrium was severely dilated. - Right ventricle: The cavity size was mildly dilated. - Right atrium: The atrium was severely dilated. - Tricuspid valve: There was moderate regurgitation. - Pulmonary arteries: PA peak pressure: 71 mm Hg (S).  Assessment/Plan:  1. Acute on chronic combined systolic and diastolic HF - last echo with RV dilation. Her options appear to be limited going forward. Discussed with Dr. Mayford Knifeurner here in the office today. Will place her on Zaroxolyn twice a week. Referral for renal consult. Do not think CHF clinic will have much to offer since she has RV dilatation but will get their input as well.   2. Rheumatic heart disease with prior MVR and s/p TAVR - echo from April noted. Reviewed by Dr. Mayford Knifeurner. PAP of 71 noted.   3. Chronic amiodarone therapy - in NSR by exam today.   4. HTN - BP improved with stopping the Norvasc  5. Prior atypical flutter with past cardioversion - remains in NSR  6. Thrombocytopenia - recent lab noted  7. Anemia - I have her back on her iron. May need to consider going back to hematology going forward - rechecking lab today  8. CKD - referring to Nephrology  Current medicines are reviewed with the patient today.  The patient does not have concerns regarding medicines other than what has been noted above.  The following changes have been made:  See above.  Labs/ tests ordered today include:    Orders Placed This Encounter  Procedures  . Brain natriuretic peptide  . Basic metabolic panel  . CBC  . Hepatic function panel  . Basic metabolic panel  . AMB referral to CHF clinic  . Ambulatory referral to Nephrology     Disposition:   FU with Dr. Mayford Knifeurner next month as planned.    Patient is agreeable to this plan  and will call if any problems develop in the interim.   Signed: Rosalio MacadamiaLori C. Nora Sabey, RN, ANP-C 12/26/2015 10:44 AM  [facility] [address] [place], [place]  [address] Phone: [phone] Fax: [phone]

## 2015-12-27 ENCOUNTER — Telehealth: Payer: Self-pay | Admitting: Cardiology

## 2015-12-27 NOTE — Telephone Encounter (Signed)
New massage     Pt verbalized that she is returning call for rn for lab results

## 2015-12-28 NOTE — Telephone Encounter (Signed)
Follow up ° ° ° ° ° °Returning a call to the nurse to get lab results °

## 2016-01-04 ENCOUNTER — Encounter (HOSPITAL_COMMUNITY): Payer: Self-pay | Admitting: Internal Medicine

## 2016-01-04 ENCOUNTER — Ambulatory Visit (HOSPITAL_COMMUNITY)
Admission: RE | Admit: 2016-01-04 | Discharge: 2016-01-04 | Disposition: A | Discharge status: Home / Self Care | Payer: Commercial Managed Care - HMO | Source: Ambulatory Visit | Attending: Internal Medicine | Admitting: Internal Medicine

## 2016-01-04 VITALS — BP 126/60 | HR 63 | Wt 185.8 lb

## 2016-01-04 DIAGNOSIS — Z882 Allergy status to sulfonamides status: Secondary | ICD-10-CM | POA: Diagnosis not present

## 2016-01-04 DIAGNOSIS — I5043 Acute on chronic combined systolic (congestive) and diastolic (congestive) heart failure: Secondary | ICD-10-CM | POA: Insufficient documentation

## 2016-01-04 DIAGNOSIS — N183 Chronic kidney disease, stage 3 unspecified: Secondary | ICD-10-CM

## 2016-01-04 DIAGNOSIS — Z954 Presence of other heart-valve replacement: Secondary | ICD-10-CM

## 2016-01-04 DIAGNOSIS — R5381 Other malaise: Secondary | ICD-10-CM | POA: Insufficient documentation

## 2016-01-04 DIAGNOSIS — I5032 Chronic diastolic (congestive) heart failure: Secondary | ICD-10-CM | POA: Diagnosis not present

## 2016-01-04 DIAGNOSIS — I130 Hypertensive heart and chronic kidney disease with heart failure and stage 1 through stage 4 chronic kidney disease, or unspecified chronic kidney disease: Secondary | ICD-10-CM | POA: Diagnosis not present

## 2016-01-04 DIAGNOSIS — Z7982 Long term (current) use of aspirin: Secondary | ICD-10-CM | POA: Diagnosis not present

## 2016-01-04 DIAGNOSIS — Z8249 Family history of ischemic heart disease and other diseases of the circulatory system: Secondary | ICD-10-CM | POA: Insufficient documentation

## 2016-01-04 DIAGNOSIS — Z953 Presence of xenogenic heart valve: Secondary | ICD-10-CM

## 2016-01-04 DIAGNOSIS — I272 Pulmonary hypertension, unspecified: Secondary | ICD-10-CM | POA: Insufficient documentation

## 2016-01-04 DIAGNOSIS — E039 Hypothyroidism, unspecified: Secondary | ICD-10-CM | POA: Diagnosis not present

## 2016-01-04 DIAGNOSIS — E669 Obesity, unspecified: Secondary | ICD-10-CM | POA: Insufficient documentation

## 2016-01-04 DIAGNOSIS — R531 Weakness: Secondary | ICD-10-CM | POA: Diagnosis not present

## 2016-01-04 DIAGNOSIS — I099 Rheumatic heart disease, unspecified: Secondary | ICD-10-CM | POA: Insufficient documentation

## 2016-01-04 DIAGNOSIS — Z7901 Long term (current) use of anticoagulants: Secondary | ICD-10-CM | POA: Insufficient documentation

## 2016-01-04 DIAGNOSIS — I480 Paroxysmal atrial fibrillation: Secondary | ICD-10-CM | POA: Insufficient documentation

## 2016-01-04 DIAGNOSIS — Z6837 Body mass index (BMI) 37.0-37.9, adult: Secondary | ICD-10-CM | POA: Diagnosis not present

## 2016-01-04 DIAGNOSIS — Z8701 Personal history of pneumonia (recurrent): Secondary | ICD-10-CM | POA: Insufficient documentation

## 2016-01-04 DIAGNOSIS — Z880 Allergy status to penicillin: Secondary | ICD-10-CM | POA: Insufficient documentation

## 2016-01-04 DIAGNOSIS — I059 Rheumatic mitral valve disease, unspecified: Secondary | ICD-10-CM

## 2016-01-04 DIAGNOSIS — Z79899 Other long term (current) drug therapy: Secondary | ICD-10-CM | POA: Insufficient documentation

## 2016-01-04 DIAGNOSIS — K761 Chronic passive congestion of liver: Secondary | ICD-10-CM | POA: Insufficient documentation

## 2016-01-04 LAB — BASIC METABOLIC PANEL
ANION GAP: 10 (ref 5–15)
BUN: 54 mg/dL — ABNORMAL HIGH (ref 6–20)
CHLORIDE: 93 mmol/L — AB (ref 101–111)
CO2: 33 mmol/L — AB (ref 22–32)
Calcium: 8.8 mg/dL — ABNORMAL LOW (ref 8.9–10.3)
Creatinine, Ser: 2.92 mg/dL — ABNORMAL HIGH (ref 0.44–1.00)
GFR calc non Af Amer: 16 mL/min — ABNORMAL LOW (ref >60)
GFR, EST AFRICAN AMERICAN: 18 mL/min — AB (ref >60)
Glucose, Bld: 159 mg/dL — ABNORMAL HIGH (ref 65–99)
POTASSIUM: 3 mmol/L — AB (ref 3.5–5.1)
SODIUM: 136 mmol/L (ref 135–145)

## 2016-01-04 LAB — DIGOXIN LEVEL

## 2016-01-04 MED ORDER — SPIRONOLACTONE 25 MG PO TABS: 12.5000 mg | ORAL_TABLET | Freq: Every day | ORAL | 3 refills | Status: DC

## 2016-01-04 NOTE — Patient Instructions (Signed)
Start Spironolactone 12.5 mg (1/2 tab) daily  Wear compression hose daily  You have been referred to Physical Therapy, they will contact you to schedule  Your physician recommends that you schedule a follow-up appointment in: 3 month

## 2016-01-04 NOTE — Progress Notes (Signed)
ADVANCED HF CLINIC NOTE  Date:  01/04/2016    Jamie Howell Date of Birth: 20-Feb-1951 Medical Record #[id number]  PCP:  Londell Moh, MD  Cardiologist:  Reita Chard  History of Present Illness: Jamie Howell is a 65 y.o. female who is followed by Dr. Mayford Knife and Norma Fredrickson, NP who is referred to the HF Clinic for further evaluation of her HF.   She has a history of rheumatic heart disease with previous mechanical mitral valve replacement on chronic warfarin therapy. She underwent TAVR 09/28/2014. Other issues include CKD 3-4 (baseline creatinine 1.8-2.3), hepatic cirrhosis, chronic thrombocytopenia, and splenomegaly.   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   Echo in 4/17 EF 45% AV/MV prostheses look good.  RV ok RVSP  Recently has been struggling with keeping her fluid off. Followed by Norma Fredrickson. Taking torsemide 20 mg bid. Recently started metolazone 2.5 mg on Tuesday and Saturday which she feels has helped tremendously. Still working FT at [employer] but does get very fatigued. Very hard for her to get around. SOB with mild exertion. No CP. No orthopnea or PND.    Past Medical History:  Diagnosis Date  . Anemia   . Aortic valve stenosis    moderate aortic valve stenosis with moderate aortic insufficiency, stable MVR, trivial PR, mild TR, severe LAE, grade II-III diastolic dysfunction,mild pulmonary HTN , EF 50% by echo 12/2012  . Atypical atrial flutter (HCC)    s/p DCCV  . Chronic combined systolic (congestive) and diastolic (congestive) heart failure   . History of blood transfusion 09/2014   "during hospitalization"  . HTN (hypertension)   . Hypothyroidism   . 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 Obesity (BMI 30-39.9) 07/08/2014  . Paroxysmal atrial fibrillation (HCC)   . Paroxysmal atrial tachycardia (HCC)    s/p DCCV  .  Pneumonia ~ 2014; 05/2014  . Portal hypertension with esophageal varices (HCC) 07/14/2014  . Pulmonary HTN    severe by echo 07/2015  . Rheumatic fever   . S/P mitral valve replacement with metallic valve 04/08/1991   31 mm St Jude bileaflet mechanical valve placed for rheumatic mitral stenosis  . 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 Splenomegaly 07/14/2014  . Subdural hematoma (HCC)    spontaneous  . Temporary low platelet count (HCC)   . Thyrotoxicosis     without mention of goiter or other cause, without mention of thyrotoxic crisis or storm    Past Surgical History:  Procedure Laterality Date  . APPLICATION OF WOUND VAC Left 10/20/2014   Procedure: APPLICATION OF WOUND VAC;  Surgeon: [name], MD;  Location: [facility] OR;  Service: Thoracic;  Laterality: Left;  . BRAIN SURGERY    . CARDIAC CATHETERIZATION     06/2014  . CARDIAC VALVE REPLACEMENT    . CARDIOVERSION N/A 02/01/2015   Procedure: CARDIOVERSION;  Surgeon: Quintella Reichert, MD;  Location: [facility] ENDOSCOPY;  Service: Cardiovascular;  Laterality: N/A;  . LAPAROSCOPIC CHOLECYSTECTOMY  05-30-90  . LEFT AND RIGHT HEART CATHETERIZATION WITH CORONARY ANGIOGRAM N/A 06/28/2014   Procedure: LEFT AND RIGHT HEART CATHETERIZATION WITH CORONARY ANGIOGRAM;  Surgeon: Tonny Bollman, MD; Normal coronaries, MVR OK, severe AS, elevated R heart pressures    . MITRAL  VALVE REPLACEMENT  04/08/1991   31mm St Jude mechanical valve - Dr Andrey Campanile  . MULTIPLE EXTRACTIONS WITH ALVEOLOPLASTY N/A 08/05/2014   Procedure: Extraction of tooth #'s 3,4,5,7,8,9,10,11,12,13,17,20,21,22,23,24,25,26,27, 28,29, and 32 with alveoloplasty;  Surgeon: Charlynne Pander, DDS;  Location: [facility] OR;  Service: Oral Surgery;  Laterality: N/A;  . SUBDURAL HEMATOMA EVACUATION VIA CRANIOTOMY  2000   Dr Dutch Quint  . TEE WITHOUT CARDIOVERSION N/A 09/28/2014   Procedure:  TRANSESOPHAGEAL ECHOCARDIOGRAM (TEE);  Surgeon: Tonny Bollman, MD;  Location: [facility] OR;  Service: Open Heart Surgery;  Laterality: N/A;  . TONSILLECTOMY    . TOTAL ABDOMINAL HYSTERECTOMY  07-23-1994  . TRANSCATHETER AORTIC VALVE REPLACEMENT, TRANSFEMORAL N/A 09/28/2014   Procedure: TRANSCATHETER AORTIC VALVE REPLACEMENT, TRANSFEMORAL;  Surgeon: Tonny Bollman, MD;  Location: [facility] OR;  Service: Open Heart Surgery;  Laterality: N/A;  . WOUND EXPLORATION Left 10/20/2014   Procedure: WOUND EXPLORATION;  Surgeon: [name], MD;  Location: [facility] OR;  Service: Thoracic;  Laterality: Left;     Medications: Current Outpatient Prescriptions  Medication Sig Dispense Refill  . amiodarone (PACERONE) 200 MG tablet Take 1 tablet (200 mg total) by mouth daily. 90 tablet 2  . aspirin EC 81 MG tablet Take 1 tablet (81 mg total) by mouth daily.    . Calcium Carb-Cholecalciferol (CALCIUM 1000 + D PO) Take 1,000 mg by mouth daily with lunch.     . cyclobenzaprine (FLEXERIL) 10 MG tablet Take 5 mg by mouth at bedtime.     . ferrous sulfate 325 (65 FE) MG tablet TAKE ONE TABLET BY MOUTH ONCE DAILY WITH BREAKFAST 30 tablet 6  . levothyroxine (SYNTHROID, LEVOTHROID) 150 MCG tablet Take 150 mcg by mouth daily before breakfast.     . metolazone (ZAROXOLYN) 2.5 MG tablet Take 1 tablet (2.5 mg total) by mouth 2 (two) times a week. 10 tablet 3  . metoprolol succinate (TOPROL-XL) 50 MG 24 hr tablet TAKE 1 TABLET BY MOUTH ONCE DAILY WITH A MEAL OR IMMEDIATELY FOLLOWING A MEAL 30 tablet 11  . potassium chloride SA (KLOR-CON M20) 20 MEQ tablet Take 2 tablets (40 mEq total) by mouth 2 (two) times daily. Take 3 tablets ( total) by mouth 2 times a day 120 tablet 11  . torsemide (DEMADEX) 20 MG tablet Take 1 tablet (20 mg total) by mouth 2 (two) times daily. 30 tablet 11  . warfarin (COUMADIN) 5 MG tablet Take 0.5 tablets (2.5 mg total) by mouth at bedtime. Or as directed.     No current facility-administered medications for  this encounter.     Allergies: Allergies  Allergen Reactions  . Adhesive [Tape] Itching    Adhesive tape and backing (pls use elastic bandages with no adhesive)  . Penicillins Rash  . Sulfa Antibiotics Rash    Social History: The patient  reports that she has never smoked. She has never used smokeless tobacco. She reports that she does not drink alcohol or use drugs.   Family History: The patient's family history includes CAD in her father and mother; Cirrhosis in her brother; Heart attack in her sister; Hypertension in her father.   Review of Systems: Please see the history of present illness.   Otherwise, the review of systems is positive for none.   All other systems are reviewed and negative.   Physical Exam: VS:  BP 126/60   Pulse 63   Wt 185 lb 12 oz (84.3 kg)   SpO2 100%   BMI 37.52 kg/m  .  BMI Body  mass index is 37.52 kg/m.  Wt Readings from Last 3 Encounters:  01/04/16 185 lb 12 oz (84.3 kg)  12/26/15 195 lb 12.8 oz (88.8 kg)  12/13/15 192 lb 12.8 oz (87.5 kg)    General: Pleasant. Chronically ill appearing but alert and in no acute distress.  Color is quite sallow. HEENT: Normal.  Neck: Supple, JVP 7, carotid bruits, or masses noted.  Cardiac: Regular rate and rhythm. Valve click noted. 2/6 TR Respiratory:  Lungs are clear to auscultation bilaterally   GI: Ab obese, soft NT ND Extremities: warm. Severe varicosities. 1+ edema Skin: Warm and dry. Color is sallow Neuro:  Strength and sensation are intact and no gross focal deficits noted.  Psych: Alert, appropriate and with normal affect.   LABORATORY DATA:  EKG:  EKG is not ordered today.  Lab Results  Component Value Date   WBC 3.5 (L) 12/26/2015   HGB 10.5 (L) 12/26/2015   HCT 32.6 (L) 12/26/2015   PLT 54 (L) 12/26/2015   GLUCOSE 144 (H) 12/26/2015   ALT 24 12/26/2015   AST 44 (H) 12/26/2015   NA 139 12/26/2015   K 3.9 12/26/2015   CL 100 12/26/2015   CREATININE 1.93 (H) 12/26/2015   BUN 34  (H) 12/26/2015   CO2 30 12/26/2015   TSH 1.96 12/02/2015   INR 2.43 (H) 01/31/2015   HGBA1C 6.2 (H) 09/24/2014    BNP (last 3 results)  Recent Labs  12/06/15 1220 12/13/15 1107 12/26/15 1058  BNP 447.3* 515.6* 496.9*    ProBNP (last 3 results) No results for input(s): PROBNP in the last 8760 hours.   Other Studies Reviewed Today:  Echo Study Conclusions from April 2017  - Left ventricle: LVEF is approximately 45% with septal, posterior and inferior hypokinesis. The cavity size was normal. Wall thickness was normal. - Aortic valve: AV prosthesis is difficult to see well Peak nad mean gradients through the valve are 18 and 11 mm Hg respectively. - Mitral valve: MV prosthesiss is difficult to see well Peak and mean gradients through the valve arer 12 and 4 mm Hg respectively. There was mild regurgitation. - Left atrium: The atrium was severely dilated. - Right ventricle: The cavity size was mildly dilated. - Right atrium: The atrium was severely dilated. - Tricuspid valve: There was moderate regurgitation. - Pulmonary arteries: PA peak pressure: 71 mm Hg (S).  Assessment/Plan:  1. Acute on chronic combined systolic and diastolic HF -   She has the after effects of long-standing rheumatic heart disease with pulmonary HTN and R-sided HF with associated cardiac cirrhosis. The focue here is typically on volume management and I think she is about as good as we can get her on her current regimen. She may even be slightly over-diuresed. Watch renal function very closely. With liver involvement will add a little spironolactone 12.5 daily. Check BMET today and in 1 week. Could consider RHC with possible trial of sildenafil but will defer for now. Needs compression hose - we have given her a prescription.   2. Rheumatic heart disease with prior MVR and s/p TAVR -   Followed by Dr. Mayford Knife  3. Prior atypical flutter with past cardioversion -   Remains in NSR.  Continue amiodarone and warfarin  4. Cardiac cirrhosis  Adding spiro  5. CKD stage III-IV  - watch renal function closely. Has referral to Nephrology.   6. Debility  - will refer to PT  Total time spent 45 minutes. Over half that time spent  discussing above.   Meris Reede,MD 3:04 PM

## 2016-01-10 ENCOUNTER — Other Ambulatory Visit: Payer: Self-pay | Admitting: *Deleted

## 2016-01-10 DIAGNOSIS — I5042 Chronic combined systolic (congestive) and diastolic (congestive) heart failure: Secondary | ICD-10-CM

## 2016-01-10 MED ORDER — TORSEMIDE 20 MG PO TABS: 20.0000 mg | ORAL_TABLET | Freq: Two times a day (BID) | ORAL | 3 refills | Status: DC

## 2016-01-19 ENCOUNTER — Other Ambulatory Visit: Payer: Self-pay | Admitting: Cardiovascular Disease

## 2016-01-24 NOTE — Progress Notes (Signed)
Cardiology Office Note    Date:  01/25/2016   ID:  Jamie Howell, DOB May 01, 1950, MRN [id number]  PCP:  Londell Moh, MD  Cardiologist:  Armanda Magic, MD   Chief Complaint  Patient presents with  . Atrial Fibrillation  . Hypertension  . Atrial Flutter  . Aortic Stenosis    History of Present Illness:  Jamie Howell is a 65 y.o. female who presents for follow-up. She has rheumatic heart disease with previous mechanical mitral valve replacement on chronic warfarin therapy. She underwent TAVR 09/28/2014. Patient has hepatic cirrhosis, chronic thrombocytopenia, and splenomegaly. Her platelet count has been ranging around 50,000. She has not had any recent bleeding complications. 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    Since I saw her last she has been seen several times in the office for volume overload, trated with PRN Zaroxolyn.  She also has been anemic ad had refused colonoscopy.  She has been iron suppl.  She was also having dizziness and her norvasc was stopped.   She is now on Zaroxolyn twice weekly and was referred to nephrology.  She was seen by [facility] service earlier in the month and was feeling better.  She was started on aldactone and compression hose were added for RHF.  She did not tolerate the aldactone due to nausea and stopped it.  Her labs showed increased creatinine and her zaroxolyn was decreased to once weekly.    She is doing well today.  She says that Monday she felt very dizzy and her potassium was low on her lab work and that was repleted.  She is feeling better today.  She is very excited as she is retiring in 1 week.  She denies chest pain, orthopnea, PND. She occasionally has some mild LE edema but it is controlled on diuretics. She denies any palpitations, syncope.  She only has DOE when climbing stairs.      Past Medical History:  Diagnosis Date  . Anemia   . Aortic  valve stenosis    moderate aortic valve stenosis with moderate aortic insufficiency, stable MVR, trivial PR, mild TR, severe LAE, grade II-III diastolic dysfunction,mild pulmonary HTN , EF 50% by echo 12/2012  . Atypical atrial flutter (HCC)    s/p DCCV  . Chronic combined systolic (congestive) and diastolic (congestive) heart failure   . History of blood transfusion 09/2014   "during hospitalization"  . HTN (hypertension)   . Hypothyroidism   . 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 Obesity (BMI 30-39.9) 07/08/2014  . Paroxysmal atrial fibrillation (HCC)   . Paroxysmal atrial tachycardia (HCC)    s/p DCCV  . Pneumonia ~ 2014; 05/2014  . Portal hypertension with esophageal varices (HCC) 07/14/2014  . Pulmonary HTN    severe by echo 07/2015  . Rheumatic fever   . S/P mitral valve replacement with metallic valve 04/08/1991   31 mm St Jude bileaflet mechanical valve placed for rheumatic mitral stenosis  . 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 Splenomegaly 07/14/2014  . Subdural hematoma (HCC)    spontaneous  . Temporary low platelet count (HCC)   . Thyrotoxicosis     without mention of goiter or other cause, without mention of thyrotoxic crisis or storm    Past  Surgical History:  Procedure Laterality Date  . APPLICATION OF WOUND VAC Left 10/20/2014   Procedure: APPLICATION OF WOUND VAC;  Surgeon: [name], MD;  Location: [facility] OR;  Service: Thoracic;  Laterality: Left;  . BRAIN SURGERY    . CARDIAC CATHETERIZATION     06/2014  . CARDIAC VALVE REPLACEMENT    . CARDIOVERSION N/A 02/01/2015   Procedure: CARDIOVERSION;  Surgeon: Quintella Reichert, MD;  Location: [facility] ENDOSCOPY;  Service: Cardiovascular;  Laterality: N/A;  . LAPAROSCOPIC CHOLECYSTECTOMY  05-30-90  . LEFT AND RIGHT HEART CATHETERIZATION WITH CORONARY ANGIOGRAM N/A 06/28/2014    Procedure: LEFT AND RIGHT HEART CATHETERIZATION WITH CORONARY ANGIOGRAM;  Surgeon: Tonny Bollman, MD; Normal coronaries, MVR OK, severe AS, elevated R heart pressures    . MITRAL VALVE REPLACEMENT  04/08/1991   31mm St Jude mechanical valve - Dr Andrey Campanile  . MULTIPLE EXTRACTIONS WITH ALVEOLOPLASTY N/A 08/05/2014   Procedure: Extraction of tooth #'s 3,4,5,7,8,9,10,11,12,13,17,20,21,22,23,24,25,26,27, 28,29, and 32 with alveoloplasty;  Surgeon: Charlynne Pander, DDS;  Location: [facility] OR;  Service: Oral Surgery;  Laterality: N/A;  . SUBDURAL HEMATOMA EVACUATION VIA CRANIOTOMY  2000   Dr Dutch Quint  . TEE WITHOUT CARDIOVERSION N/A 09/28/2014   Procedure: TRANSESOPHAGEAL ECHOCARDIOGRAM (TEE);  Surgeon: Tonny Bollman, MD;  Location: [facility] OR;  Service: Open Heart Surgery;  Laterality: N/A;  . TONSILLECTOMY    . TOTAL ABDOMINAL HYSTERECTOMY  07-23-1994  . TRANSCATHETER AORTIC VALVE REPLACEMENT, TRANSFEMORAL N/A 09/28/2014   Procedure: TRANSCATHETER AORTIC VALVE REPLACEMENT, TRANSFEMORAL;  Surgeon: Tonny Bollman, MD;  Location: [facility] OR;  Service: Open Heart Surgery;  Laterality: N/A;  . WOUND EXPLORATION Left 10/20/2014   Procedure: WOUND EXPLORATION;  Surgeon: [name], MD;  Location: [facility] OR;  Service: Thoracic;  Laterality: Left;    Current Medications: Outpatient Medications Prior to Visit  Medication Sig Dispense Refill  . amiodarone (PACERONE) 200 MG tablet Take 1 tablet (200 mg total) by mouth daily. 90 tablet 2  . aspirin EC 81 MG tablet Take 1 tablet (81 mg total) by mouth daily.    . Calcium Carb-Cholecalciferol (CALCIUM 1000 + D PO) Take 1,000 mg by mouth daily with lunch.     . cyclobenzaprine (FLEXERIL) 10 MG tablet Take 5 mg by mouth at bedtime.     . ferrous sulfate 325 (65 FE) MG tablet TAKE ONE TABLET BY MOUTH ONCE DAILY WITH BREAKFAST 30 tablet 6  . levothyroxine (SYNTHROID, LEVOTHROID) 150 MCG tablet Take 150 mcg by mouth daily before breakfast.     . metolazone (ZAROXOLYN) 2.5 MG tablet Take  1 tablet (2.5 mg total) by mouth 2 (two) times a week. 10 tablet 3  . metoprolol succinate (TOPROL-XL) 50 MG 24 hr tablet TAKE 1 TABLET BY MOUTH ONCE DAILY WITH A MEAL OR IMMEDIATELY FOLLOWING A MEAL 90 tablet 3  . potassium chloride SA (KLOR-CON M20) 20 MEQ tablet Take 2 tablets (40 mEq total) by mouth 2 (two) times daily. Take 3 tablets ( total) by mouth 2 times a day 120 tablet 11  . torsemide (DEMADEX) 20 MG tablet Take 1 tablet (20 mg total) by mouth 2 (two) times daily. (Patient taking differently: Take 20 mg by mouth 2 (two) times daily. TAKE 2 TABLETS MON WED AND FRI OTHER DAY TAKE ONE TABLET) 180 tablet 3  . warfarin (COUMADIN) 5 MG tablet Take 0.5 tablets (2.5 mg total) by mouth at bedtime. Or as directed.     No facility-administered medications prior to visit.  Allergies:   Adhesive [tape]; Spironolactone; Penicillins; and Sulfa antibiotics   Social History   Social History  . Marital status: Single    Spouse name: N/A  . Number of children: 2  . Years of education: N/A   Occupational History  . circulation desk clerk    Social History Main Topics  . Smoking status: Never Smoker  . Smokeless tobacco: Never Used  . Alcohol use No  . Drug use: No  . Sexual activity: Not Currently   Other Topics Concern  . Not on file   Social History Narrative   Pt lives in [place] with son.  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     Family History:  The patient's family history includes CAD in her father and mother; Cirrhosis in her brother; Heart attack in her sister; Hypertension in her father.   ROS:   Please see the history of present illness.    ROS All other systems reviewed and are negative.  No flowsheet data found.   No data found.     PHYSICAL EXAM:   VS:  BP (!) 108/58   Pulse 66   Ht 4\' 11"  (1.499 m)   Wt 178 lb (80.7 kg)   BMI 35.95 kg/m    GEN: Well nourished, well developed, in no acute distress  HEENT: normal  Neck: no JVD, carotid  bruits, or masses Cardiac: RRR; no murmurs, rubs, or gallops,no edema.  Intact distal pulses bilaterally.  Respiratory:  clear to auscultation bilaterally, normal work of breathing GI: soft, nontender, nondistended, + BS MS: no deformity or atrophy  Skin: warm and dry, no rash Neuro:  Alert and Oriented x 3, Strength and sensation are intact Psych: euthymic mood, full affect  Wt Readings from Last 3 Encounters:  01/25/16 178 lb (80.7 kg)  01/04/16 185 lb 12 oz (84.3 kg)  12/26/15 195 lb 12.8 oz (88.8 kg)      Studies/Labs Reviewed:   EKG:  EKG is not ordered today.    Recent Labs: 12/02/2015: TSH 1.96 12/26/2015: ALT 24; Brain Natriuretic Peptide 496.9; Hemoglobin 10.5; Platelets 54 01/04/2016: BUN 54; Creatinine, Ser 2.92; Potassium 3.0; Sodium 136   Lipid Panel No results found for: CHOL, TRIG, HDL, CHOLHDL, VLDL, LDLCALC, LDLDIRECT  Additional studies/ records that were reviewed today include:  none    ASSESSMENT:    1. Severe aortic valve stenosis   2. Rheumatic mitral valve disease   3. Pulmonary HTN   4. Persistent atrial fibrillation (HCC)   5. Essential hypertension   6. Chronic diastolic CHF (congestive heart failure) (HCC)   7. Atypical atrial flutter (HCC)     PLAN:  In order of problems listed above:  1. Severe AS s/p TAVR.  Continue ASA. 2. Rheumatic mitral valve disease s/p MVR with mechanical MVR.  Continue chronic warfarin therapy.  Continue baby ASA.  This is monitored by PCP. 3. Severe pulmonary HTN secondary to #1 and 2 with last echo assessment showing PASP .   4. Persistent atrial fibrillation  maintaining NSR on BB/Amio.  5. HTN - BP controlled on current meds.  Continue BB.  6. Chronic combined systolic/diastolic CHF from RHD now with severe right sided heart failure secondary to severe pulmonary HTN and cardiac cirrhosis- appears euvolemic on exam. Weight is down 17lbs from last month and 7lbs from being seen in [facility].   Continue Demadex and  Zaroxolyn once weekly (she took this yesterday).  Did not tolerate aldactone.  Check BMET to  make sure potassium repleted.  She is orthostatic today.  I will cut her BB back to Toprol 25mg  daily.  May need to stop Zaroxolyn completely based on what her repeat BMET today shows.   7. Atypical atrial flutter s/p DCCV and maintaining NSR.  Continue BB/Amio and warfarin.  8. Anemia most likely secondary to CKD.  She is followed by Hematology and is on iron.  9. Hepatic cirrhosis with chronic thrombocytopenia and splenomegaly secondary to CHF.  Medication Adjustments/Labs and Tests Ordered: Current medicines are reviewed at length with the patient today.  Concerns regarding medicines are outlined above.  Medication changes, Labs and Tests ordered today are listed in the Patient Instructions below.  There are no Patient Instructions on file for this visit.   Signed, Armanda Magicraci Channing Yeager, MD  01/25/2016 8:46 AM    [facility] [address], [place], [place]  [address] Phone: [phone]; Fax: [phone]

## 2016-01-25 ENCOUNTER — Ambulatory Visit (INDEPENDENT_AMBULATORY_CARE_PROVIDER_SITE_OTHER): Payer: Commercial Managed Care - HMO | Admitting: Cardiology

## 2016-01-25 ENCOUNTER — Encounter (INDEPENDENT_AMBULATORY_CARE_PROVIDER_SITE_OTHER): Payer: Self-pay

## 2016-01-25 VITALS — BP 108/58 | HR 66 | Ht 59.0 in | Wt 178.0 lb

## 2016-01-25 DIAGNOSIS — I272 Pulmonary hypertension, unspecified: Secondary | ICD-10-CM | POA: Diagnosis not present

## 2016-01-25 DIAGNOSIS — I350 Nonrheumatic aortic (valve) stenosis: Secondary | ICD-10-CM

## 2016-01-25 DIAGNOSIS — I481 Persistent atrial fibrillation: Secondary | ICD-10-CM | POA: Diagnosis not present

## 2016-01-25 DIAGNOSIS — I10 Essential (primary) hypertension: Secondary | ICD-10-CM

## 2016-01-25 DIAGNOSIS — I059 Rheumatic mitral valve disease, unspecified: Secondary | ICD-10-CM | POA: Diagnosis not present

## 2016-01-25 DIAGNOSIS — I4819 Other persistent atrial fibrillation: Secondary | ICD-10-CM

## 2016-01-25 DIAGNOSIS — I5032 Chronic diastolic (congestive) heart failure: Secondary | ICD-10-CM

## 2016-01-25 DIAGNOSIS — I484 Atypical atrial flutter: Secondary | ICD-10-CM

## 2016-01-25 LAB — BASIC METABOLIC PANEL
BUN: 64 mg/dL — ABNORMAL HIGH (ref 7–25)
CHLORIDE: 98 mmol/L (ref 98–110)
CO2: 32 mmol/L — AB (ref 20–31)
CREATININE: 2.05 mg/dL — AB (ref 0.50–0.99)
Calcium: 8.5 mg/dL — ABNORMAL LOW (ref 8.6–10.4)
Glucose, Bld: 183 mg/dL — ABNORMAL HIGH (ref 65–99)
POTASSIUM: 3.4 mmol/L — AB (ref 3.5–5.3)
SODIUM: 138 mmol/L (ref 135–146)

## 2016-01-25 MED ORDER — METOPROLOL SUCCINATE ER 25 MG PO TB24: 25.0000 mg | ORAL_TABLET | Freq: Every day | ORAL | 3 refills | Status: DC

## 2016-01-25 NOTE — Addendum Note (Signed)
Addended by: Gunnar FusiKEMP, KATHRYN A on: 01/25/2016 09:36 AM   Modules accepted: Orders

## 2016-01-25 NOTE — Patient Instructions (Addendum)
Medication Instructions:  1) DECREASE TOPROL to 25 mg daily  Labwork: TODAY: BMET  Testing/Procedures: None  Follow-Up: You have a follow-up appointment scheduled with Dr. Mayford Knifeurner 05/30/16 at 10:15 AM.  Any Other Special Instructions Will Be Listed Below (If Applicable).     If you need a refill on your cardiac medications before your next appointment, please call your pharmacy.

## 2016-01-27 ENCOUNTER — Telehealth: Payer: Self-pay | Admitting: *Deleted

## 2016-01-27 DIAGNOSIS — R7989 Other specified abnormal findings of blood chemistry: Secondary | ICD-10-CM

## 2016-01-27 DIAGNOSIS — E876 Hypokalemia: Secondary | ICD-10-CM

## 2016-01-27 NOTE — Telephone Encounter (Signed)
Informed pt of results. Pt verbalized understanding and was in agreement with coming in for repeat blood work on Monday.  Pt states that she has been off of the Metolazone since she was last seen by her nephrologist which was about a week before she seen Dr. Mayford Knifeurner. Advised I would make her aware.

## 2016-01-27 NOTE — Telephone Encounter (Signed)
-----   Message from Quintella Reichertraci R Turner, MD sent at 01/27/2016  8:11 AM EDT ----- Please have patient stop metolazone and repeat BMET on Monday.  Please verify current dose of potassium

## 2016-01-30 ENCOUNTER — Encounter (INDEPENDENT_AMBULATORY_CARE_PROVIDER_SITE_OTHER): Payer: Self-pay

## 2016-01-30 ENCOUNTER — Other Ambulatory Visit: Payer: Commercial Managed Care - HMO | Admitting: *Deleted

## 2016-01-30 DIAGNOSIS — R7989 Other specified abnormal findings of blood chemistry: Secondary | ICD-10-CM

## 2016-01-30 DIAGNOSIS — E876 Hypokalemia: Secondary | ICD-10-CM

## 2016-01-30 LAB — BASIC METABOLIC PANEL
BUN: 56 mg/dL — AB (ref 7–25)
CHLORIDE: 100 mmol/L (ref 98–110)
CO2: 29 mmol/L (ref 20–31)
CREATININE: 1.75 mg/dL — AB (ref 0.50–0.99)
Calcium: 8.4 mg/dL — ABNORMAL LOW (ref 8.6–10.4)
GLUCOSE: 207 mg/dL — AB (ref 65–99)
POTASSIUM: 3.8 mmol/L (ref 3.5–5.3)
Sodium: 137 mmol/L (ref 135–146)

## 2016-02-08 ENCOUNTER — Inpatient Hospital Stay (HOSPITAL_COMMUNITY)
Admission: AD | Admit: 2016-02-08 | Discharge: 2016-02-18 | DRG: 641 | Disposition: A | Discharge status: Hospice | Payer: PPO | Source: Ambulatory Visit | Attending: Cardiology | Admitting: Cardiology

## 2016-02-08 ENCOUNTER — Telehealth: Payer: Self-pay | Admitting: Cardiology

## 2016-02-08 ENCOUNTER — Encounter: Payer: Self-pay | Admitting: Physician Assistant

## 2016-02-08 ENCOUNTER — Encounter (HOSPITAL_COMMUNITY): Payer: Self-pay | Admitting: General Practice

## 2016-02-08 ENCOUNTER — Ambulatory Visit (INDEPENDENT_AMBULATORY_CARE_PROVIDER_SITE_OTHER): Payer: PPO | Admitting: Physician Assistant

## 2016-02-08 VITALS — BP 137/67 | HR 72 | Ht 59.0 in | Wt 179.1 lb

## 2016-02-08 DIAGNOSIS — I272 Pulmonary hypertension, unspecified: Secondary | ICD-10-CM

## 2016-02-08 DIAGNOSIS — K922 Gastrointestinal hemorrhage, unspecified: Secondary | ICD-10-CM | POA: Diagnosis present

## 2016-02-08 DIAGNOSIS — N183 Chronic kidney disease, stage 3 unspecified: Secondary | ICD-10-CM | POA: Diagnosis present

## 2016-02-08 DIAGNOSIS — I481 Persistent atrial fibrillation: Secondary | ICD-10-CM | POA: Diagnosis present

## 2016-02-08 DIAGNOSIS — K761 Chronic passive congestion of liver: Secondary | ICD-10-CM | POA: Diagnosis present

## 2016-02-08 DIAGNOSIS — D696 Thrombocytopenia, unspecified: Secondary | ICD-10-CM | POA: Diagnosis present

## 2016-02-08 DIAGNOSIS — N184 Chronic kidney disease, stage 4 (severe): Secondary | ICD-10-CM | POA: Diagnosis present

## 2016-02-08 DIAGNOSIS — D62 Acute posthemorrhagic anemia: Secondary | ICD-10-CM | POA: Diagnosis present

## 2016-02-08 DIAGNOSIS — I484 Atypical atrial flutter: Secondary | ICD-10-CM | POA: Diagnosis present

## 2016-02-08 DIAGNOSIS — I4819 Other persistent atrial fibrillation: Secondary | ICD-10-CM | POA: Diagnosis present

## 2016-02-08 DIAGNOSIS — Z8249 Family history of ischemic heart disease and other diseases of the circulatory system: Secondary | ICD-10-CM

## 2016-02-08 DIAGNOSIS — K766 Portal hypertension: Secondary | ICD-10-CM | POA: Diagnosis present

## 2016-02-08 DIAGNOSIS — I099 Rheumatic heart disease, unspecified: Secondary | ICD-10-CM | POA: Diagnosis present

## 2016-02-08 DIAGNOSIS — E860 Dehydration: Secondary | ICD-10-CM | POA: Diagnosis not present

## 2016-02-08 DIAGNOSIS — R188 Other ascites: Secondary | ICD-10-CM | POA: Diagnosis present

## 2016-02-08 DIAGNOSIS — I5042 Chronic combined systolic (congestive) and diastolic (congestive) heart failure: Secondary | ICD-10-CM

## 2016-02-08 DIAGNOSIS — I10 Essential (primary) hypertension: Secondary | ICD-10-CM

## 2016-02-08 DIAGNOSIS — Z515 Encounter for palliative care: Secondary | ICD-10-CM

## 2016-02-08 DIAGNOSIS — Z79899 Other long term (current) drug therapy: Secondary | ICD-10-CM

## 2016-02-08 DIAGNOSIS — I130 Hypertensive heart and chronic kidney disease with heart failure and stage 1 through stage 4 chronic kidney disease, or unspecified chronic kidney disease: Secondary | ICD-10-CM | POA: Diagnosis present

## 2016-02-08 DIAGNOSIS — R161 Splenomegaly, not elsewhere classified: Secondary | ICD-10-CM | POA: Diagnosis present

## 2016-02-08 DIAGNOSIS — Z952 Presence of prosthetic heart valve: Secondary | ICD-10-CM

## 2016-02-08 DIAGNOSIS — I8500 Esophageal varices without bleeding: Secondary | ICD-10-CM | POA: Diagnosis present

## 2016-02-08 DIAGNOSIS — E039 Hypothyroidism, unspecified: Secondary | ICD-10-CM | POA: Diagnosis present

## 2016-02-08 DIAGNOSIS — K7290 Hepatic failure, unspecified without coma: Secondary | ICD-10-CM | POA: Diagnosis present

## 2016-02-08 DIAGNOSIS — I8510 Secondary esophageal varices without bleeding: Secondary | ICD-10-CM | POA: Diagnosis present

## 2016-02-08 DIAGNOSIS — R791 Abnormal coagulation profile: Secondary | ICD-10-CM | POA: Diagnosis present

## 2016-02-08 DIAGNOSIS — T502X5A Adverse effect of carbonic-anhydrase inhibitors, benzothiadiazides and other diuretics, initial encounter: Secondary | ICD-10-CM | POA: Diagnosis present

## 2016-02-08 DIAGNOSIS — Z66 Do not resuscitate: Secondary | ICD-10-CM | POA: Diagnosis not present

## 2016-02-08 DIAGNOSIS — Z7901 Long term (current) use of anticoagulants: Secondary | ICD-10-CM

## 2016-02-08 DIAGNOSIS — I959 Hypotension, unspecified: Secondary | ICD-10-CM | POA: Diagnosis present

## 2016-02-08 DIAGNOSIS — K769 Liver disease, unspecified: Secondary | ICD-10-CM

## 2016-02-08 DIAGNOSIS — K3189 Other diseases of stomach and duodenum: Secondary | ICD-10-CM | POA: Diagnosis present

## 2016-02-08 DIAGNOSIS — Z7982 Long term (current) use of aspirin: Secondary | ICD-10-CM

## 2016-02-08 DIAGNOSIS — I2729 Other secondary pulmonary hypertension: Secondary | ICD-10-CM | POA: Diagnosis present

## 2016-02-08 DIAGNOSIS — D649 Anemia, unspecified: Secondary | ICD-10-CM | POA: Diagnosis present

## 2016-02-08 DIAGNOSIS — K7581 Nonalcoholic steatohepatitis (NASH): Secondary | ICD-10-CM | POA: Diagnosis present

## 2016-02-08 LAB — MAGNESIUM: Magnesium: 1.8 mg/dL (ref 1.7–2.4)

## 2016-02-08 LAB — CBC WITH DIFFERENTIAL/PLATELET
Basophils Absolute: 0 10*3/uL (ref 0.0–0.1)
Basophils Relative: 0 %
Eosinophils Absolute: 0 10*3/uL (ref 0.0–0.7)
Eosinophils Relative: 1 %
HEMATOCRIT: 22 % — AB (ref 36.0–46.0)
HEMOGLOBIN: 7.2 g/dL — AB (ref 12.0–15.0)
LYMPHS ABS: 0.5 10*3/uL — AB (ref 0.7–4.0)
LYMPHS PCT: 8 %
MCH: 30.9 pg (ref 26.0–34.0)
MCHC: 32.7 g/dL (ref 30.0–36.0)
MCV: 94.4 fL (ref 78.0–100.0)
MONOS PCT: 5 %
Monocytes Absolute: 0.3 10*3/uL (ref 0.1–1.0)
NEUTROS PCT: 86 %
Neutro Abs: 5.1 10*3/uL (ref 1.7–7.7)
Platelets: 65 10*3/uL — ABNORMAL LOW (ref 150–400)
RBC: 2.33 MIL/uL — AB (ref 3.87–5.11)
RDW: 18.9 % — ABNORMAL HIGH (ref 11.5–15.5)
WBC: 5.9 10*3/uL (ref 4.0–10.5)

## 2016-02-08 LAB — HEPATIC FUNCTION PANEL
ALK PHOS: 94 U/L (ref 38–126)
ALT: 28 U/L (ref 14–54)
AST: 49 U/L — AB (ref 15–41)
Albumin: 2.5 g/dL — ABNORMAL LOW (ref 3.5–5.0)
BILIRUBIN DIRECT: 0.9 mg/dL — AB (ref 0.1–0.5)
BILIRUBIN INDIRECT: 1.9 mg/dL — AB (ref 0.3–0.9)
TOTAL PROTEIN: 5.7 g/dL — AB (ref 6.5–8.1)
Total Bilirubin: 2.8 mg/dL — ABNORMAL HIGH (ref 0.3–1.2)

## 2016-02-08 LAB — BASIC METABOLIC PANEL
Anion gap: 9 (ref 5–15)
BUN: 62 mg/dL — AB (ref 6–20)
CHLORIDE: 97 mmol/L — AB (ref 101–111)
CO2: 31 mmol/L (ref 22–32)
Calcium: 8.7 mg/dL — ABNORMAL LOW (ref 8.9–10.3)
Creatinine, Ser: 1.85 mg/dL — ABNORMAL HIGH (ref 0.44–1.00)
GFR calc Af Amer: 32 mL/min — ABNORMAL LOW (ref >60)
GFR calc non Af Amer: 27 mL/min — ABNORMAL LOW (ref >60)
GLUCOSE: 176 mg/dL — AB (ref 65–99)
POTASSIUM: 3.8 mmol/L (ref 3.5–5.1)
Sodium: 137 mmol/L (ref 135–145)

## 2016-02-08 LAB — PROTIME-INR: INR: >10

## 2016-02-08 MED ORDER — LEVOTHYROXINE SODIUM 75 MCG PO TABS
150.0000 ug | ORAL_TABLET | Freq: Every day | ORAL | Status: DC
  Administered 2016-02-09 – 2016-02-18 (×10): 150 ug via ORAL
  Filled 2016-02-08 (×11): qty 2

## 2016-02-08 MED ORDER — SODIUM CHLORIDE 0.9 % IV SOLN
INTRAVENOUS | Status: DC
  Administered 2016-02-08: 14:00:00 via INTRAVENOUS
  Administered 2016-02-09: 75 mL/h via INTRAVENOUS

## 2016-02-08 MED ORDER — ONDANSETRON HCL 4 MG PO TABS: 4.0000 mg | ORAL_TABLET | Freq: Four times a day (QID) | ORAL | Status: DC | PRN

## 2016-02-08 MED ORDER — CALCIUM CARBONATE 1250 (500 CA) MG PO TABS
ORAL_TABLET | Freq: Every day | ORAL | Status: DC
  Administered 2016-02-08 – 2016-02-18 (×10): 1250 mg via ORAL
  Filled 2016-02-08: qty 1
  Filled 2016-02-08: qty 3
  Filled 2016-02-08 (×12): qty 1

## 2016-02-08 MED ORDER — WARFARIN SODIUM 2.5 MG PO TABS: 2.5000 mg | ORAL_TABLET | Freq: Every day | ORAL | Status: DC

## 2016-02-08 MED ORDER — ONDANSETRON HCL 4 MG/2ML IJ SOLN
4.0000 mg | Freq: Four times a day (QID) | INTRAMUSCULAR | Status: DC | PRN
  Administered 2016-02-10: 4 mg via INTRAVENOUS
  Filled 2016-02-08: qty 2

## 2016-02-08 MED ORDER — SODIUM CHLORIDE 0.9 % IV BOLUS (SEPSIS)
500.0000 mL | Freq: Once | INTRAVENOUS | Status: AC
  Administered 2016-02-08: 500 mL via INTRAVENOUS

## 2016-02-08 MED ORDER — FERROUS SULFATE 325 (65 FE) MG PO TABS
325.0000 mg | ORAL_TABLET | Freq: Every day | ORAL | Status: DC
  Administered 2016-02-09 – 2016-02-18 (×10): 325 mg via ORAL
  Filled 2016-02-08 (×10): qty 1

## 2016-02-08 MED ORDER — AMIODARONE HCL 200 MG PO TABS
200.0000 mg | ORAL_TABLET | Freq: Every day | ORAL | Status: DC
  Administered 2016-02-08 – 2016-02-18 (×11): 200 mg via ORAL
  Filled 2016-02-08 (×11): qty 1

## 2016-02-08 MED ORDER — METOPROLOL SUCCINATE ER 25 MG PO TB24
25.0000 mg | ORAL_TABLET | Freq: Every day | ORAL | Status: DC
  Administered 2016-02-10 – 2016-02-13 (×2): 25 mg via ORAL
  Filled 2016-02-08 (×4): qty 1

## 2016-02-08 MED ORDER — ASPIRIN EC 81 MG PO TBEC
81.0000 mg | DELAYED_RELEASE_TABLET | Freq: Every day | ORAL | Status: DC
  Administered 2016-02-08 – 2016-02-10 (×3): 81 mg via ORAL
  Filled 2016-02-08 (×3): qty 1

## 2016-02-08 MED ORDER — PHYTONADIONE 5 MG PO TABS
5.0000 mg | ORAL_TABLET | Freq: Once | ORAL | Status: AC
  Administered 2016-02-08: 5 mg via ORAL
  Filled 2016-02-08: qty 1

## 2016-02-08 MED ORDER — CYCLOBENZAPRINE HCL 10 MG PO TABS
5.0000 mg | ORAL_TABLET | Freq: Every day | ORAL | Status: DC
  Administered 2016-02-12 – 2016-02-17 (×2): 5 mg via ORAL
  Filled 2016-02-08 (×5): qty 1

## 2016-02-08 NOTE — Telephone Encounter (Signed)
Rang x6, no ans, no voicemail.

## 2016-02-08 NOTE — Patient Instructions (Signed)
YOU ARE BEING SENT TO [facility] FOR OBSERVATION FOR DEHYDRATION; YOU WILL BE GOING TO [facility].

## 2016-02-08 NOTE — Telephone Encounter (Signed)
Pt saw Bing NeighborsScott W. PA today and [facility].

## 2016-02-08 NOTE — Progress Notes (Signed)
Cardiology Office Note:    Date:  02/08/2016   ID:  Jamie Howell, DOB 08/19/1950, MRN [id number]  PCP:  Jamie MohPHARR,Howell DAVIDSON, MD  Cardiologist:  Dr. Armanda Magicraci Howell   Electrophysiologist:  Dr. Hillis RangeJames Howell  TAVR: Dr. Tonny BollmanMichael Howell  CHF: Dr. Arvilla Meresaniel Howell  Nephrology: Dr. Briant CedarMattingly  Referring MD: Jamie BrunettePharr, Walter, MD   Chief Complaint  Patient presents with  . Dizziness    History of Present Illness:    Jamie Howell is a 65 y.o. 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 She was seen often by Jamie FredricksonLori Gerhardt, NP in 9/17 for recurrent CHF.  She was diuresed and referred to Nephrology and AHF Clinic. She saw Dr. Arvilla Meresaniel Howell last month.  He felt she was suffering the effects of long-standing rheumatic heart disease with pulmonary HTN and R sided HF with assoc cardiac cirrhosis.  Volume management was felt to be key and he did add Spironolactone.  She was given a Rx for compression hose.  RHC could be considered in the future but this was deferred for now.  She was last seen by Dr. Armanda Magicraci Howell on 01/25/16.  She had DC'd the spironolactone due to nausea.  Her metolazone was twice a week but this was decreased to once a week due to increased Creatinine.  At her visit with Dr. Mayford Knifeurner, she was doing well.  She was orthostatic and her Toprol dose was reduced.  FU labs demonstrated her K+ was low and this was replaced.  Of note, her nephrologist had taken her off of metolazone 2-3 weeks ago.    She called in today with c/o nausea, dizziness/near syncope.  She is added on for evaluation.  The patient mistakenly took a dose of metolazone last week. Shortly after this, she started to feel bad. She notes dizziness and near syncope with standing. She  denies frank syncope. She has been nauseated. She has been heaving but denies vomiting. She has lost her appetite. She denies orthopnea or PND. Her LE edema is actually down. She denies any significant dyspnea. She denies chest pain. She denies hematochezia or hematuria. She did note a dark stool this morning.   Prior CV studies that were reviewed today include:    Echo 07/08/15 EF 45%, septal, post and inf HK, TAVR ok with peak and mean 18 and 11 mmHg, MVR ok with peak and mean 12 and 4 mmHg, mild MR, severe BAE, mild RVE, mod TR, PASP 71 mmHg  LHC 3/16 LM Widely patent  LAD patent LCx patent RCA patent Aortic Root Angiography: The aortic valve is severely calcified. There is mild aortic insufficiency. The aortic valve mobility is restricted.  The mechanical bileaflet mitral valve appears to have normal leaflet motion. The mitral annulus is severely calcified.   Event Monitor 2/16 Sinus rhythm, PVCs  Myoview 4/11 Inf ischemia, EF 54%  Past Medical History:  Diagnosis Date  . Anemia   . Aortic valve stenosis    moderate aortic valve stenosis with moderate aortic insufficiency, stable MVR, trivial PR, mild TR, severe LAE, grade II-III diastolic dysfunction,mild pulmonary HTN , EF 50% by echo 12/2012  . Atypical atrial flutter (HCC)    s/p DCCV  . Chronic combined systolic (congestive) and diastolic (congestive) heart failure   . History of blood transfusion 09/2014   "  during hospitalization"  . HTN (hypertension)   . Hypothyroidism   . 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 Obesity (BMI 30-39.9) 07/08/2014  . Paroxysmal atrial fibrillation (HCC)   . Paroxysmal atrial tachycardia (HCC)    s/p DCCV  . Pneumonia ~ 2014; 05/2014  . Portal hypertension with esophageal varices (HCC) 07/14/2014  . Pulmonary HTN    severe by echo 07/2015  . Rheumatic fever   . S/P mitral valve replacement with metallic valve 04/08/1991   31 mm St Jude bileaflet  mechanical valve placed for rheumatic mitral stenosis  . 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 Splenomegaly 07/14/2014  . Subdural hematoma (HCC)    spontaneous  . Temporary low platelet count (HCC)   . Thyrotoxicosis     without mention of goiter or other cause, without mention of thyrotoxic crisis or storm    Past Surgical History:  Procedure Laterality Date  . APPLICATION OF WOUND VAC Left 10/20/2014   Procedure: APPLICATION OF WOUND VAC;  Surgeon: Purcell Nailslarence H Owen, MD;  Location: [facility] OR;  Service: Thoracic;  Laterality: Left;  . BRAIN SURGERY    . CARDIAC CATHETERIZATION     06/2014  . CARDIAC VALVE REPLACEMENT    . CARDIOVERSION N/A 02/01/2015   Procedure: CARDIOVERSION;  Surgeon: Quintella Reichertraci R Turner, MD;  Location: [facility] ENDOSCOPY;  Service: Cardiovascular;  Laterality: N/A;  . LAPAROSCOPIC CHOLECYSTECTOMY  05-30-90  . LEFT AND RIGHT HEART CATHETERIZATION WITH CORONARY ANGIOGRAM N/A 06/28/2014   Procedure: LEFT AND RIGHT HEART CATHETERIZATION WITH CORONARY ANGIOGRAM;  Surgeon: Jamie BollmanMichael Cooper, MD; Normal coronaries, MVR OK, severe AS, elevated R heart pressures    . MITRAL VALVE REPLACEMENT  04/08/1991   31mm St Jude mechanical valve - Dr Andrey CampanileWilson  . MULTIPLE EXTRACTIONS WITH ALVEOLOPLASTY N/A 08/05/2014   Procedure: Extraction of tooth #'s 3,4,5,7,8,9,10,11,12,13,17,20,21,22,23,24,25,26,27, 28,29, and 32 with alveoloplasty;  Surgeon: Charlynne Panderonald F Kulinski, DDS;  Location: [facility] OR;  Service: Oral Surgery;  Laterality: N/A;  . SUBDURAL HEMATOMA EVACUATION VIA CRANIOTOMY  2000   Dr Dutch QuintPoole  . TEE WITHOUT CARDIOVERSION N/A 09/28/2014   Procedure: TRANSESOPHAGEAL ECHOCARDIOGRAM (TEE);  Surgeon: Jamie BollmanMichael Cooper, MD;  [facility];  Service: Open Heart Surgery;  Laterality: N/A;  . TONSILLECTOMY    . TOTAL ABDOMINAL HYSTERECTOMY  07-23-1994  . TRANSCATHETER AORTIC VALVE REPLACEMENT,  TRANSFEMORAL N/A 09/28/2014   Procedure: TRANSCATHETER AORTIC VALVE REPLACEMENT, TRANSFEMORAL;  Surgeon: Jamie BollmanMichael Cooper, MD;  [facility];  Service: Open Heart Surgery;  Laterality: N/A;  . WOUND EXPLORATION Left 10/20/2014   Procedure: WOUND EXPLORATION;  Surgeon: Purcell Nailslarence H Owen, MD;  Location: [facility] OR;  Service: Thoracic;  Laterality: Left;    Current Medications: Current Meds  Medication Sig  . amiodarone (PACERONE) 200 MG tablet Take 1 tablet (200 mg total) by mouth daily.  Marland Kitchen. aspirin EC 81 MG tablet Take 1 tablet (81 mg total) by mouth daily.  . Calcium Carb-Cholecalciferol (CALCIUM 1000 + D PO) Take 1,000 mg by mouth daily with lunch.   . cyclobenzaprine (FLEXERIL) 10 MG tablet Take 5 mg by mouth at bedtime.   . ferrous sulfate 325 (65 FE) MG tablet TAKE ONE TABLET BY MOUTH ONCE DAILY WITH BREAKFAST  . levothyroxine (SYNTHROID, LEVOTHROID) 150 MCG tablet Take 150 mcg by mouth daily before breakfast.   . metoprolol succinate (TOPROL-XL) 50 MG 24 hr tablet  TAKE HALF TABLET (25 MG TOTAL) BY MOUTH ONCE DAILY  . potassium chloride SA (K-DUR,KLOR-CON) 20 MEQ tablet Take 20 mEq by mouth 2 (two) times daily.  Marland Kitchen torsemide (DEMADEX) 20 MG tablet TAKE TWO TABLETS BY MOUTH MON, WED ,AND FRI THEN OTHER DAYS ONE TABLET BY MOUTH  . warfarin (COUMADIN) 5 MG tablet Take 0.5 tablets (2.5 mg total) by mouth at bedtime. Or as directed.     Allergies:   Adhesive [tape]; Spironolactone; Penicillins; and Sulfa antibiotics   Social History   Social History  . Marital status: Single    Spouse name: N/A  . Number of children: 2  . Years of education: N/A   Occupational History  . circulation desk clerk    Social History Main Topics  . Smoking status: Never Smoker  . Smokeless tobacco: Never Used  . Alcohol use No  . Drug use: No  . Sexual activity: Not Currently   Other Topics Concern  . None   Social History Narrative   Pt lives in [place] with son.  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     Family History:  The patient's family history includes CAD in her father and mother; Cirrhosis in her brother; Heart attack in her sister; Hypertension in her father.   ROS:   Please see the history of present illness.    Review of Systems  Constitution: Positive for chills and decreased appetite.  Cardiovascular: Positive for dyspnea on exertion.  Neurological: Positive for dizziness and headaches.   All other systems reviewed and are negative.   EKGs/Labs/Other Test Reviewed:    EKG:  EKG is  ordered today.  The ekg ordered today demonstrates NSR, HR 72, normal axis, IVCD, QTc 497, no significant change compared with prior tracing  Recent Labs: 12/02/2015: TSH 1.96 12/26/2015: ALT 24; Brain Natriuretic Peptide 496.9; Hemoglobin 10.5; Platelets 54 01/30/2016: BUN 56; Creat 1.75; Potassium 3.8; Sodium 137   Recent Lipid Panel No results found for: CHOL, TRIG, HDL, CHOLHDL, VLDL, LDLCALC, LDLDIRECT   Physical Exam:    VS:  BP 137/67   Pulse 72   Ht 4\' 11"  (1.499 m)   Wt 179 lb 1.9 oz (81.2 kg)   BMI 36.18 kg/m     Orthostatic VS for the past 24 hrs (Last 3 readings):  BP- Lying Pulse- Lying BP- Sitting Pulse- Sitting BP- Standing at 0 minutes Pulse- Standing at 0 minutes BP- Standing at 3 minutes Pulse- Standing at 3 minutes  02/08/16 1228 137/67 72 112/67 71 (!) 78/54 71 (!) 76/50 70    Wt Readings from Last 3 Encounters:  02/08/16 179 lb 1.9 oz (81.2 kg)  01/25/16 178 lb (80.7 kg)  01/04/16 185 lb 12 oz (84.3 kg)     Physical Exam  Constitutional: She is oriented to person, place, and time. She appears well-developed and well-nourished. She appears ill.  HENT:  Head: Normocephalic and atraumatic.  Eyes: Scleral icterus is present.  Neck: No JVD present.  Cardiovascular: Normal rate and regular rhythm.   No murmur heard. Mechanical S1, normal S2  Pulmonary/Chest: Effort normal. She has no wheezes. She has no rales.  Abdominal: Soft. There is  hepatomegaly. There is no tenderness. There is no rebound.  Musculoskeletal: She exhibits no edema.  Compression stockings in place  Neurological: She is alert and oriented to person, place, and time.  Skin: Skin is warm and dry. There is pallor.  Jaundiced   Psychiatric: She has a normal mood and  affect.    ASSESSMENT:    1. Dehydration   2. Chronic combined systolic and diastolic CHF (congestive heart failure) (HCC)   3. Rheumatic heart disease s/p Mechanical MVR and TAVR   4. Persistent atrial fibrillation (HCC)   5. Essential hypertension   6. CKD (chronic kidney disease) stage 3, GFR 30-59 ml/min   7. Cardiac cirrhosis   8. Pulmonary HTN    PLAN:    In order of problems listed above:  1. Dehydration - She is over-diuresed.  BP drops from 137 >> 76 from lying to standing. She mistakenly took a dose of Metolazone last week and started to feel bad after this.  She also appears jaundiced and pale. She had a dark stool this AM.  She has a complex history and I do not think she should get IVFs in the office or increase salt at home.  I think she needs IV hydration with close monitoring.  I have recommended that [facility].  I d/w Dr. Donato SchultzMark Skains (DOD) who also saw the patient.  He agreed.  -  [facility].   -  IVFs: 250 cc NS bolus, then 75 cc/hr overnight.  -  CBC, BMET, LFTs  -  Hold Torsemide today and gently resume prior to DC  -  Stop Metolazone  2. Chronic combined systolic and diastolic CHF - As noted, she is over-diuresed.  Her weight is down from 199 to 179 since 9/17 when she was followed by Jamie FredricksonLori Gerhardt, NP. Continue low dose beta-blocker.  3. Rheumatic heart disease - s/p mechanical SJ MVR and subsequent TAVR in 2016.  Valves functioning appropriately at Echo in 4/17.   -  Continue Coumadin  -  Continue SBE prophylaxis  -  Check CBC  4. AFib/Flutter - s/p DCCV.  Maintaining NSR on Amiodarone.  Continue Coumadin.   5.  CKD - She is followed by Nephrology.  Hopefully, her Creatinine is not significantly elevated.  Will check BMET.   7. Cirrhosis - Probable cardiac cirrhosis.  She appears jaundiced.  Check LFTs.  8. Severe Pulmonary HTN - 2/2 Rheumatic Heart Disease now with R sided HF.  PASP in 4/17 was 71. Leg edema is down and she is wearing compression hose.     Medication Adjustments/Labs and Tests Ordered: Current medicines are reviewed at length with the patient today.  Concerns regarding medicines are outlined above.  Medication changes, Labs and Tests ordered today are outlined in the Patient Instructions noted below. Patient Instructions  YOU ARE BEING SENT TO [facility] FOR OBSERVATION FOR DEHYDRATION; YOU WILL BE GOING TO UN[address].   Signed, Tereso NewcomerScott Weaver, PA-C  02/08/2016 1:25 PM    [facility] [address], [place], [place]  [address] Phone: [phone]; Fax: [phone]

## 2016-02-08 NOTE — Telephone Encounter (Signed)
Pt cb. She c/o dizziness and near syncopal episodes intermittently since Friday. She c/o nausea, no vomiting, no SOB, and no CP.  She states she would like to be seen today.  Appt made by front for 11/9.  I put pt in 1215 for [facility]red HealthcareScott Weaver today. I advised pt of ED precautions. She voiced understanding and agreed with plan.

## 2016-02-08 NOTE — Progress Notes (Signed)
Patient direct admit, PT/INR resulted as critical NP on call made aware. Will continue to monitor.

## 2016-02-08 NOTE — H&P (Signed)
Cardiology Office Note:    Date:  02/08/2016   ID:  Jamie Howell, DOB 08/19/1950, MRN [id number]  PCP:  Londell MohPHARR,[name], MD  Cardiologist:  Dr. Armanda Magicraci Turner   Electrophysiologist:  Dr. Hillis RangeJames Allred  TAVR: Dr. Tonny BollmanMichael Cooper  CHF: Dr. Arvilla Meresaniel Bensimhon  Nephrology: Dr. Briant CedarMattingly  Referring MD: Merri BrunettePharr, Walter, MD   Chief Complaint  Patient presents with  . Dizziness    History of Present Illness:    Jamie Howell is a 65 y.o. 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 She was seen often by [name], NP in 9/17 for recurrent CHF.  She was diuresed and referred to Nephrology and AHF Clinic. She saw Dr. Arvilla Meresaniel Bensimhon last month.  He felt she was suffering the effects of long-standing rheumatic heart disease with pulmonary HTN and R sided HF with assoc cardiac cirrhosis.  Volume management was felt to be key and he did add Spironolactone.  She was given a Rx for compression hose.  RHC could be considered in the future but this was deferred for now.  She was last seen by Dr. Armanda Magicraci Turner on 01/25/16.  She had DC'd the spironolactone due to nausea.  Her metolazone was twice a week but this was decreased to once a week due to increased Creatinine.  At her visit with Dr. Mayford Knifeurner, she was doing well.  She was orthostatic and her Toprol dose was reduced.  FU labs demonstrated her K+ was low and this was replaced.  Of note, her nephrologist had taken her off of metolazone 2-3 weeks ago.    She called in today with c/o nausea, dizziness/near syncope.  She is added on for evaluation.  The patient mistakenly took a dose of metolazone last week. Shortly after this, she started to feel bad. She notes dizziness and near syncope with standing. She  denies frank syncope. She has been nauseated. She has been heaving but denies vomiting. She has lost her appetite. She denies orthopnea or PND. Her LE edema is actually down. She denies any significant dyspnea. She denies chest pain. She denies hematochezia or hematuria. She did note a dark stool this morning.   Prior CV studies that were reviewed today include:    Echo 07/08/15 EF 45%, septal, post and inf HK, TAVR ok with peak and mean 18 and 11 mmHg, MVR ok with peak and mean 12 and 4 mmHg, mild MR, severe BAE, mild RVE, mod TR, PASP 71 mmHg  LHC 3/16 LM Widely patent  LAD patent LCx patent RCA patent Aortic Root Angiography: The aortic valve is severely calcified. There is mild aortic insufficiency. The aortic valve mobility is restricted.  The mechanical bileaflet mitral valve appears to have normal leaflet motion. The mitral annulus is severely calcified.   Event Monitor 2/16 Sinus rhythm, PVCs  Myoview 4/11 Inf ischemia, EF 54%  Past Medical History:  Diagnosis Date  . Anemia   . Aortic valve stenosis    moderate aortic valve stenosis with moderate aortic insufficiency, stable MVR, trivial PR, mild TR, severe LAE, grade II-III diastolic dysfunction,mild pulmonary HTN , EF 50% by echo 12/2012  . Atypical atrial flutter (HCC)    s/p DCCV  . Chronic combined systolic (congestive) and diastolic (congestive) heart failure   . History of blood transfusion 09/2014   "  during hospitalization"  . HTN (hypertension)   . Hypothyroidism   . 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 Obesity (BMI 30-39.9) 07/08/2014  . Paroxysmal atrial fibrillation (HCC)   . Paroxysmal atrial tachycardia (HCC)    s/p DCCV  . Pneumonia ~ 2014; 05/2014  . Portal hypertension with esophageal varices (HCC) 07/14/2014  . Pulmonary HTN    severe by echo 07/2015  . Rheumatic fever   . S/P mitral valve replacement with metallic valve 04/08/1991   31 mm St Jude bileaflet  mechanical valve placed for rheumatic mitral stenosis  . 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 Splenomegaly 07/14/2014  . Subdural hematoma (HCC)    spontaneous  . Temporary low platelet count (HCC)   . Thyrotoxicosis     without mention of goiter or other cause, without mention of thyrotoxic crisis or storm    Past Surgical History:  Procedure Laterality Date  . APPLICATION OF WOUND VAC Left 10/20/2014   Procedure: APPLICATION OF WOUND VAC;  Surgeon: Purcell Nailslarence H Owen, MD;  Location: [facility] OR;  Service: Thoracic;  Laterality: Left;  . BRAIN SURGERY    . CARDIAC CATHETERIZATION     06/2014  . CARDIAC VALVE REPLACEMENT    . CARDIOVERSION N/A 02/01/2015   Procedure: CARDIOVERSION;  Surgeon: Quintella Reichertraci R Turner, MD;  Location: [facility] ENDOSCOPY;  Service: Cardiovascular;  Laterality: N/A;  . LAPAROSCOPIC CHOLECYSTECTOMY  05-30-90  . LEFT AND RIGHT HEART CATHETERIZATION WITH CORONARY ANGIOGRAM N/A 06/28/2014   Procedure: LEFT AND RIGHT HEART CATHETERIZATION WITH CORONARY ANGIOGRAM;  Surgeon: Tonny BollmanMichael Cooper, MD; Normal coronaries, MVR OK, severe AS, elevated R heart pressures    . MITRAL VALVE REPLACEMENT  04/08/1991   31mm St Jude mechanical valve - Dr Andrey CampanileWilson  . MULTIPLE EXTRACTIONS WITH ALVEOLOPLASTY N/A 08/05/2014   Procedure: Extraction of tooth #'s 3,4,5,7,8,9,10,11,12,13,17,20,21,22,23,24,25,26,27, 28,29, and 32 with alveoloplasty;  Surgeon: Charlynne Panderonald F Kulinski, DDS;  Location: [facility] OR;  Service: Oral Surgery;  Laterality: N/A;  . SUBDURAL HEMATOMA EVACUATION VIA CRANIOTOMY  2000   Dr Dutch QuintPoole  . TEE WITHOUT CARDIOVERSION N/A 09/28/2014   Procedure: TRANSESOPHAGEAL ECHOCARDIOGRAM (TEE);  Surgeon: Tonny BollmanMichael Cooper, MD;  [facility];  Service: Open Heart Surgery;  Laterality: N/A;  . TONSILLECTOMY    . TOTAL ABDOMINAL HYSTERECTOMY  07-23-1994  . TRANSCATHETER AORTIC VALVE REPLACEMENT,  TRANSFEMORAL N/A 09/28/2014   Procedure: TRANSCATHETER AORTIC VALVE REPLACEMENT, TRANSFEMORAL;  Surgeon: Tonny BollmanMichael Cooper, MD;  [facility];  Service: Open Heart Surgery;  Laterality: N/A;  . WOUND EXPLORATION Left 10/20/2014   Procedure: WOUND EXPLORATION;  Surgeon: Purcell Nailslarence H Owen, MD;  Location: [facility] OR;  Service: Thoracic;  Laterality: Left;    Current Medications: Current Meds  Medication Sig  . amiodarone (PACERONE) 200 MG tablet Take 1 tablet (200 mg total) by mouth daily.  Marland Kitchen. aspirin EC 81 MG tablet Take 1 tablet (81 mg total) by mouth daily.  . Calcium Carb-Cholecalciferol (CALCIUM 1000 + D PO) Take 1,000 mg by mouth daily with lunch.   . cyclobenzaprine (FLEXERIL) 10 MG tablet Take 5 mg by mouth at bedtime.   . ferrous sulfate 325 (65 FE) MG tablet TAKE ONE TABLET BY MOUTH ONCE DAILY WITH BREAKFAST  . levothyroxine (SYNTHROID, LEVOTHROID) 150 MCG tablet Take 150 mcg by mouth daily before breakfast.   . metoprolol succinate (TOPROL-XL) 50 MG 24 hr tablet  TAKE HALF TABLET (25 MG TOTAL) BY MOUTH ONCE DAILY  . potassium chloride SA (K-DUR,KLOR-CON) 20 MEQ tablet Take 20 mEq by mouth 2 (two) times daily.  Marland Kitchen torsemide (DEMADEX) 20 MG tablet TAKE TWO TABLETS BY MOUTH MON, WED ,AND FRI THEN OTHER DAYS ONE TABLET BY MOUTH  . warfarin (COUMADIN) 5 MG tablet Take 0.5 tablets (2.5 mg total) by mouth at bedtime. Or as directed.     Allergies:   Adhesive [tape]; Spironolactone; Penicillins; and Sulfa antibiotics   Social History   Social History  . Marital status: Single    Spouse name: N/A  . Number of children: 2  . Years of education: N/A   Occupational History  . circulation desk clerk    Social History Main Topics  . Smoking status: Never Smoker  . Smokeless tobacco: Never Used  . Alcohol use No  . Drug use: No  . Sexual activity: Not Currently   Other Topics Concern  . None   Social History Narrative   Pt lives in [place] with son.  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     Family History:  The patient's family history includes CAD in her father and mother; Cirrhosis in her brother; Heart attack in her sister; Hypertension in her father.   ROS:   Please see the history of present illness.    Review of Systems  Constitution: Positive for chills and decreased appetite.  Cardiovascular: Positive for dyspnea on exertion.  Neurological: Positive for dizziness and headaches.   All other systems reviewed and are negative.   EKGs/Labs/Other Test Reviewed:    EKG:  EKG is  ordered today.  The ekg ordered today demonstrates NSR, HR 72, normal axis, IVCD, QTc 497, no significant change compared with prior tracing  Recent Labs: 12/02/2015: TSH 1.96 12/26/2015: ALT 24; Brain Natriuretic Peptide 496.9; Hemoglobin 10.5; Platelets 54 01/30/2016: BUN 56; Creat 1.75; Potassium 3.8; Sodium 137   Recent Lipid Panel No results found for: CHOL, TRIG, HDL, CHOLHDL, VLDL, LDLCALC, LDLDIRECT   Physical Exam:    VS:  BP 137/67   Pulse 72   Ht 4\' 11"  (1.499 m)   Wt 179 lb 1.9 oz (81.2 kg)   BMI 36.18 kg/m     Orthostatic VS for the past 24 hrs (Last 3 readings):  BP- Lying Pulse- Lying BP- Sitting Pulse- Sitting BP- Standing at 0 minutes Pulse- Standing at 0 minutes BP- Standing at 3 minutes Pulse- Standing at 3 minutes  02/08/16 1228 137/67 72 112/67 71 (!) 78/54 71 (!) 76/50 70    Wt Readings from Last 3 Encounters:  02/08/16 179 lb 1.9 oz (81.2 kg)  01/25/16 178 lb (80.7 kg)  01/04/16 185 lb 12 oz (84.3 kg)     Physical Exam  Constitutional: She is oriented to person, place, and time. She appears well-developed and well-nourished. She appears ill.  HENT:  Head: Normocephalic and atraumatic.  Eyes: Scleral icterus is present.  Neck: No JVD present.  Cardiovascular: Normal rate and regular rhythm.   No murmur heard. Mechanical S1, normal S2  Pulmonary/Chest: Effort normal. She has no wheezes. She has no rales.  Abdominal: Soft. There is  hepatomegaly. There is no tenderness. There is no rebound.  Musculoskeletal: She exhibits no edema.  Compression stockings in place  Neurological: She is alert and oriented to person, place, and time.  Skin: Skin is warm and dry. There is pallor.  Jaundiced   Psychiatric: She has a normal mood and  affect.    ASSESSMENT:    1. Chronic combined systolic and diastolic CHF (congestive heart failure) (HCC)   2. Rheumatic heart disease s/p Mechanical MVR and TAVR   3. Persistent atrial fibrillation (HCC)   4. Essential hypertension   5. CKD (chronic kidney disease) stage 3, GFR 30-59 ml/min   6. Cardiac cirrhosis   7. Pulmonary HTN    PLAN:    In order of problems listed above:  1. Dehydration - She is over-diuresed.  BP drops from 137 >> 76 from lying to standing. She mistakenly took a dose of Metolazone last week and started to feel bad after this.  She also appears jaundiced and pale. She had a dark stool this AM.  She has a complex history and I do not think she should get IVFs in the office or increase salt at home.  I think she needs IV hydration with close monitoring.  I have recommended that [facility].  I d/w Dr. Donato SchultzMark Skains (DOD) who also saw the patient.  He agreed.  -  [facility].   -  IVFs: 250 cc NS bolus, then 75 cc/hr overnight.  -  CBC, BMET, LFTs  -  Hold Torsemide today and gently resume prior to DC  -  Stop Metolazone  2. Chronic combined systolic and diastolic CHF - As noted, she is over-diuresed.  Her weight is down from 199 to 179 since 9/17 when she was followed by [name], NP. Continue low dose beta-blocker.  3. Rheumatic heart disease - s/p mechanical SJ MVR and subsequent TAVR in 2016.  Valves functioning appropriately at Echo in 4/17.   -  Continue Coumadin  -  Continue SBE prophylaxis  -  Check CBC  4. AFib/Flutter - s/p DCCV.  Maintaining NSR on Amiodarone.  Continue Coumadin.   5. CKD - She is  followed by Nephrology.  Hopefully, her Creatinine is not significantly elevated.  Will check BMET.   7. Cirrhosis - Probable cardiac cirrhosis.  She appears jaundiced.  Check LFTs.  8. Severe Pulmonary HTN - 2/2 Rheumatic Heart Disease now with R sided HF.  PASP in 4/17 was 71. Leg edema is down and she is wearing compression hose.    Signed, Tereso NewcomerScott Weaver, PA-C  02/08/2016 12:37 PM    [facility] [address], [place], [place]  [address] Phone: [phone]; Fax: [phone]   Personally seen and examined. Agree with above. Quite extensive medical history, hypotension today in clinic, looks pale, perhaps even slightly jaundiced. Admitting for further evaluation and management. May need fluids as documented above. We will follow closely.  Donato SchultzMark Skains, MD

## 2016-02-08 NOTE — Telephone Encounter (Signed)
New Message  Pt call requesting to speak with RN. Pt states she has been experiencing lightheadedness, dizziness, the feeling of passing out, and dry heaving. Pt was schedule for an appt 11/8 with B Simmons. Pt wants to know if she needs to be seen today. Please call back to discuss

## 2016-02-08 NOTE — Progress Notes (Signed)
Patient's BP 79/43. Patient states she feels very weak. Paged Dr. Shirlee LatchMcLean; received orders for 500cc bolus x1. Will continue to monitor.

## 2016-02-08 NOTE — Progress Notes (Signed)
ANTICOAGULATION CONSULT NOTE - Initial Consult  Pharmacy Consult for Warfarin Indication: mechanical MVR  Allergies  Allergen Reactions  . Adhesive [Tape] Itching    Adhesive tape and backing (pls use elastic bandages with no adhesive)  . Spironolactone Other (See Comments)    Severe dizziness and nausea  . Penicillins Rash    Has patient had a PCN reaction causing immediate rash, facial/tongue/throat swelling, SOB or lightheadedness with hypotension: Yes Has patient had a PCN reaction causing severe rash involving mucus membranes or skin necrosis: No Has patient had a PCN reaction that required hospitalization No Has patient had a PCN reaction occurring within the last 10 years: No If all of the above answers are "NO", then may proceed with Cephalosporin use.   . Sulfa Antibiotics Rash    Patient Measurements: Height: 4\' 11"  (149.9 cm) Weight: 177 lb 11.2 oz (80.6 kg) IBW/kg (Calculated) : 43.2 Heparin Dosing Weight: n/a  Vital Signs: Temp: 97.6 F (36.4 C) (11/08 1339) Temp Source: Oral (11/08 1339) BP: 90/62 (11/08 1339) Pulse Rate: 76 (11/08 1339)  Labs:  Recent Labs  02/08/16 1427  HGB 7.2*  HCT 22.0*  PLT 65*  LABPROT >90.0*  INR >10.00*  CREATININE 1.85*    Estimated Creatinine Clearance: 27.9 mL/min (by C-G formula based on SCr of 1.85 mg/dL (H)).   Medical History: Past Medical History:  Diagnosis Date  . Anemia   . Aortic valve stenosis    moderate aortic valve stenosis with moderate aortic insufficiency, stable MVR, trivial PR, mild TR, severe LAE, grade II-III diastolic dysfunction,mild pulmonary HTN , EF 50% by echo 12/2012  . Atypical atrial flutter (HCC)    s/p DCCV  . Chronic combined systolic (congestive) and diastolic (congestive) heart failure   . History of blood transfusion 09/2014   "during hospitalization"  . HTN (hypertension)   . Hypothyroidism   . 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  Obesity (BMI 30-39.9) 07/08/2014  . Paroxysmal atrial fibrillation (HCC)   . Paroxysmal atrial tachycardia (HCC)    s/p DCCV  . Pneumonia ~ 2014; 05/2014  . Portal hypertension with esophageal varices (HCC) 07/14/2014  . Pulmonary HTN    severe by echo 07/2015  . Rheumatic fever   . S/P mitral valve replacement with metallic valve 04/08/1991   31 mm St Jude bileaflet mechanical valve placed for rheumatic mitral stenosis  . 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 Splenomegaly 07/14/2014  . Subdural hematoma (HCC)    spontaneous  . Temporary low platelet count (HCC)   . Thyrotoxicosis     without mention of goiter or other cause, without mention of thyrotoxic crisis or storm    Medications:  Scheduled:  . amiodarone  200 mg Oral Daily  . aspirin EC  81 mg Oral Daily  . calcium carbonate   Oral Q lunch  . cyclobenzaprine  5 mg Oral QHS  . [START ON 02/09/2016] ferrous sulfate  325 mg Oral Q breakfast  . [START ON 02/09/2016] levothyroxine  150 mcg Oral QAC breakfast  . metoprolol succinate  25 mg Oral Daily    Assessment: 65 yo female on chronic Coumadin for mechanical MVR.  Admitted with supratherapeutic INR.  Reportedly taking Coumadin 5 mg daily PTA with 2.5 mg on Mon, Wed, Fri.  Per discussing with PA, patient confused about medications at  home and may have been taking too much Coumadin.  No bleeding or complications noted.  Hgb down to 7.2 from 10.5 9/25.  Pltc = 65, c/w previous values.  Goal of Therapy:  INR 2.5-3.5  Monitor platelets by anticoagulation protocol: Yes   Plan:  1. No Coumadin tonight. 2. Vitamin K 5 mg orally given hx spontaneous subdural hematoma and low Hgb. Discussed with Ward Givenshris Berge, PA. 3. Daily INR.  Tad MooreJessica Willy Pinkerton, Pharm D, BCPS  Clinical Pharmacist Pager [phone]  02/08/2016 5:29 PM

## 2016-02-09 ENCOUNTER — Ambulatory Visit: Payer: Commercial Managed Care - HMO | Admitting: Cardiology

## 2016-02-09 DIAGNOSIS — K3189 Other diseases of stomach and duodenum: Secondary | ICD-10-CM | POA: Diagnosis present

## 2016-02-09 DIAGNOSIS — D62 Acute posthemorrhagic anemia: Secondary | ICD-10-CM | POA: Diagnosis present

## 2016-02-09 DIAGNOSIS — K761 Chronic passive congestion of liver: Secondary | ICD-10-CM | POA: Diagnosis not present

## 2016-02-09 DIAGNOSIS — R42 Dizziness and giddiness: Secondary | ICD-10-CM | POA: Diagnosis present

## 2016-02-09 DIAGNOSIS — N183 Chronic kidney disease, stage 3 (moderate): Secondary | ICD-10-CM | POA: Diagnosis not present

## 2016-02-09 DIAGNOSIS — Z952 Presence of prosthetic heart valve: Secondary | ICD-10-CM | POA: Diagnosis not present

## 2016-02-09 DIAGNOSIS — D696 Thrombocytopenia, unspecified: Secondary | ICD-10-CM | POA: Diagnosis present

## 2016-02-09 DIAGNOSIS — R791 Abnormal coagulation profile: Secondary | ICD-10-CM | POA: Diagnosis present

## 2016-02-09 DIAGNOSIS — E860 Dehydration: Secondary | ICD-10-CM | POA: Diagnosis present

## 2016-02-09 DIAGNOSIS — I484 Atypical atrial flutter: Secondary | ICD-10-CM | POA: Diagnosis present

## 2016-02-09 DIAGNOSIS — D500 Iron deficiency anemia secondary to blood loss (chronic): Secondary | ICD-10-CM | POA: Diagnosis not present

## 2016-02-09 DIAGNOSIS — N184 Chronic kidney disease, stage 4 (severe): Secondary | ICD-10-CM | POA: Diagnosis present

## 2016-02-09 DIAGNOSIS — I130 Hypertensive heart and chronic kidney disease with heart failure and stage 1 through stage 4 chronic kidney disease, or unspecified chronic kidney disease: Secondary | ICD-10-CM | POA: Diagnosis present

## 2016-02-09 DIAGNOSIS — K766 Portal hypertension: Secondary | ICD-10-CM | POA: Diagnosis present

## 2016-02-09 DIAGNOSIS — I481 Persistent atrial fibrillation: Secondary | ICD-10-CM | POA: Diagnosis present

## 2016-02-09 DIAGNOSIS — K7290 Hepatic failure, unspecified without coma: Secondary | ICD-10-CM | POA: Diagnosis present

## 2016-02-09 DIAGNOSIS — I959 Hypotension, unspecified: Secondary | ICD-10-CM | POA: Diagnosis present

## 2016-02-09 DIAGNOSIS — K7581 Nonalcoholic steatohepatitis (NASH): Secondary | ICD-10-CM | POA: Diagnosis present

## 2016-02-09 DIAGNOSIS — Z7901 Long term (current) use of anticoagulants: Secondary | ICD-10-CM | POA: Diagnosis not present

## 2016-02-09 DIAGNOSIS — K922 Gastrointestinal hemorrhage, unspecified: Secondary | ICD-10-CM | POA: Diagnosis present

## 2016-02-09 DIAGNOSIS — D649 Anemia, unspecified: Secondary | ICD-10-CM | POA: Diagnosis present

## 2016-02-09 DIAGNOSIS — R188 Other ascites: Secondary | ICD-10-CM | POA: Diagnosis present

## 2016-02-09 DIAGNOSIS — I2729 Other secondary pulmonary hypertension: Secondary | ICD-10-CM | POA: Diagnosis present

## 2016-02-09 DIAGNOSIS — I8510 Secondary esophageal varices without bleeding: Secondary | ICD-10-CM | POA: Diagnosis present

## 2016-02-09 DIAGNOSIS — T502X5A Adverse effect of carbonic-anhydrase inhibitors, benzothiadiazides and other diuretics, initial encounter: Secondary | ICD-10-CM | POA: Diagnosis present

## 2016-02-09 DIAGNOSIS — R161 Splenomegaly, not elsewhere classified: Secondary | ICD-10-CM | POA: Diagnosis present

## 2016-02-09 DIAGNOSIS — Z79899 Other long term (current) drug therapy: Secondary | ICD-10-CM

## 2016-02-09 DIAGNOSIS — I5042 Chronic combined systolic (congestive) and diastolic (congestive) heart failure: Secondary | ICD-10-CM | POA: Diagnosis present

## 2016-02-09 DIAGNOSIS — Z8249 Family history of ischemic heart disease and other diseases of the circulatory system: Secondary | ICD-10-CM | POA: Diagnosis not present

## 2016-02-09 DIAGNOSIS — Z7982 Long term (current) use of aspirin: Secondary | ICD-10-CM | POA: Diagnosis not present

## 2016-02-09 LAB — CBC
HCT: 24.4 % — ABNORMAL LOW (ref 36.0–46.0)
HEMATOCRIT: 17.3 % — AB (ref 36.0–46.0)
HEMOGLOBIN: 7.8 g/dL — AB (ref 12.0–15.0)
Hemoglobin: 5.7 g/dL — CL (ref 12.0–15.0)
MCH: 31 pg (ref 26.0–34.0)
MCH: 30.8 pg (ref 26.0–34.0)
MCHC: 32.9 g/dL (ref 30.0–36.0)
MCHC: 33.2 g/dL (ref 30.0–36.0)
MCV: 94 fL (ref 78.0–100.0)
MCV: 92.8 fL (ref 78.0–100.0)
PLATELETS: 46 10*3/uL — AB (ref 150–400)
Platelets: 48 10*3/uL — ABNORMAL LOW (ref 150–400)
RBC: 1.84 MIL/uL — ABNORMAL LOW (ref 3.87–5.11)
RBC: 2.63 MIL/uL — AB (ref 3.87–5.11)
RDW: 18.8 % — AB (ref 11.5–15.5)
RDW: 18.6 % — ABNORMAL HIGH (ref 11.5–15.5)
WBC: 4 10*3/uL (ref 4.0–10.5)
WBC: 4.4 10*3/uL (ref 4.0–10.5)

## 2016-02-09 LAB — BASIC METABOLIC PANEL
ANION GAP: 7 (ref 5–15)
BUN: 58 mg/dL — ABNORMAL HIGH (ref 6–20)
CALCIUM: 8.1 mg/dL — AB (ref 8.9–10.3)
CO2: 29 mmol/L (ref 22–32)
Chloride: 99 mmol/L — ABNORMAL LOW (ref 101–111)
Creatinine, Ser: 1.59 mg/dL — ABNORMAL HIGH (ref 0.44–1.00)
GFR, EST AFRICAN AMERICAN: 38 mL/min — AB (ref >60)
GFR, EST NON AFRICAN AMERICAN: 33 mL/min — AB (ref >60)
Glucose, Bld: 162 mg/dL — ABNORMAL HIGH (ref 65–99)
Potassium: 3.4 mmol/L — ABNORMAL LOW (ref 3.5–5.1)
Sodium: 135 mmol/L (ref 135–145)

## 2016-02-09 LAB — PROTIME-INR
INR: 2.21
INR: 8.11
PROTHROMBIN TIME: 24.9 s — AB (ref 11.4–15.2)
Prothrombin Time: 70.4 seconds — ABNORMAL HIGH (ref 11.4–15.2)

## 2016-02-09 LAB — PREPARE RBC (CROSSMATCH)

## 2016-02-09 MED ORDER — SODIUM CHLORIDE 0.9 % IV SOLN: Freq: Once | INTRAVENOUS | Status: DC

## 2016-02-09 NOTE — Progress Notes (Signed)
ANTICOAGULATION CONSULT NOTE - Follow Up Consult  Pharmacy Consult for Coumadin Indication: MVR & AVR- mechanical  Allergies  Allergen Reactions  . Adhesive [Tape] Itching    Adhesive tape and backing (pls use elastic bandages with no adhesive)  . Spironolactone Other (See Comments)    Severe dizziness and nausea  . Penicillins Rash    Has patient had a PCN reaction causing immediate rash, facial/tongue/throat swelling, SOB or lightheadedness with hypotension: Yes Has patient had a PCN reaction causing severe rash involving mucus membranes or skin necrosis: No Has patient had a PCN reaction that required hospitalization No Has patient had a PCN reaction occurring within the last 10 years: No If all of the above answers are "NO", then may proceed with Cephalosporin use.   . Sulfa Antibiotics Rash    Patient Measurements: Height: 4\' 11"  (149.9 cm) Weight: 185 lb 6.4 oz (84.1 kg) IBW/kg (Calculated) : 43.2  Vital Signs: Temp: 98.8 F (37.1 C) (11/09 0841) Temp Source: Oral (11/09 0841) BP: 96/74 (11/09 0841) Pulse Rate: 76 (11/08 2132)  Labs:  Recent Labs  02/08/16 1427 02/09/16 0013 02/09/16 0128  HGB 7.2* 5.7*  --   HCT 22.0* 17.3*  --   PLT 65* 46*  --   LABPROT >90.0* 70.4*  --   INR >10.00* 8.11*  --   CREATININE 1.85*  --  1.59*    Estimated Creatinine Clearance: 33.2 mL/min (by C-G formula based on SCr of 1.59 mg/dL (H)).  Assessment: 65yo with supratherapeutic INR on Coumadin.  Pt is confused about her medications & may have been taking too much Coumadin.  INR is down to 8.1 this AM, after Vit K 5mg  po.  Hg is down this AM & pt is being transfused, no bleeding.  Pltc is low- down from 65 which is c/w with historical values).    Pt with hx of cirrhosis of unknown origin, as well as portal HTN with esophageal varices.  Goal of Therapy:  INR 2.5-3.5 Monitor platelets by anticoagulation protocol: Yes   Plan:  No Coumadin today Watch for s/s of  bleeding  Jamie Howell, PharmD Clinical Pharmacist [facility] 

## 2016-02-09 NOTE — Progress Notes (Signed)
Nutrition Brief Note  Patient identified on the Malnutrition Screening Tool (MST) Report. Patient with weight loss on admission related to dehydration. Since admit, patient has gained up to usual weight.  Wt Readings from Last 15 Encounters:  02/09/16 185 lb 6.4 oz (84.1 kg)  02/08/16 179 lb 1.9 oz (81.2 kg)  01/25/16 178 lb (80.7 kg)  01/04/16 185 lb 12 oz (84.3 kg)  12/26/15 195 lb 12.8 oz (88.8 kg)  12/13/15 192 lb 12.8 oz (87.5 kg)  12/06/15 190 lb 12.8 oz (86.5 kg)  12/02/15 199 lb 12.8 oz (90.6 kg)  09/15/15 183 lb 14.4 oz (83.4 kg)  07/25/15 183 lb 12.8 oz (83.4 kg)  07/08/15 196 lb 6.4 oz (89.1 kg)  03/16/15 183 lb (83 kg)  02/01/15 176 lb (79.8 kg)  01/10/15 184 lb 6.4 oz (83.6 kg)  01/05/15 186 lb 12.8 oz (84.7 kg)    Body mass index is 37.45 kg/m. Patient meets criteria for obesity, class 2, based on current BMI.   Current diet order is heart healthy, patient is consuming approximately 100% of meals at this time. Labs and medications reviewed.   No nutrition interventions warranted at this time. If nutrition issues arise, please consult RD.   Joaquin CourtsKimberly Garnet Overfield, RD, LDN, CNSC Pager [phone] After Hours Pager [phone]

## 2016-02-09 NOTE — Care Management Obs Status (Signed)
MEDICARE OBSERVATION STATUS NOTIFICATION   Patient Details  Name: Jamie Howell MRN: [id number] Date of Birth: 04/29/1950   Medicare Observation Status Notification Given:  Yes    Gala LewandowskyGraves-Bigelow, Kenzli Barritt Kaye, RN 02/09/2016, 12:47 PM

## 2016-02-09 NOTE — Progress Notes (Signed)
Paged Dr. Shirlee LatchMclean with INR result of 8.11. No new orders at this time. Patient w/ no signs of active bleeding. Will continue to monitor.

## 2016-02-09 NOTE — Progress Notes (Signed)
CRITICAL VALUE ALERT  Critical value received:  Hemoglobin 5.7  Date of notification: 02/09/16  Time of notification:  0045  Critical value read back: yes  Nurse who received alert:  Minna AntisStephanie Lucine Bilski   MD notified (1st page):  Mclean  Time of first page:  0045  MD notified (2nd page):  Time of second page:  Responding MD:  Shirlee LatchMcLean  Time MD responded:  [email or web address]; orders for 2 units RBC; stop IV fluids so patient does not become overloaded.

## 2016-02-09 NOTE — Progress Notes (Signed)
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 Primary basis for admission yesterday was dehydration possibly related to over diuresis. In addition there was confusion about her Coumadin dosing. Her INR was supratherapeutic and hemoglobin was reduced.  Inpatient Medications    Scheduled Meds: . sodium chloride   Intravenous Once  . amiodarone  200 mg Oral Daily  . aspirin EC  81 mg Oral Daily  . calcium carbonate   Oral Q lunch  . cyclobenzaprine  5 mg Oral QHS  . ferrous sulfate  325 mg Oral Q breakfast  . levothyroxine  150 mcg Oral QAC breakfast  . metoprolol succinate  25 mg Oral Daily   Continuous Infusions: . sodium chloride Stopped (02/09/16 0445)   PRN Meds: ondansetron **OR** ondansetron (ZOFRAN) IV   Vital Signs    Vitals:   02/09/16 0800 02/09/16 0841 02/09/16 0854 02/09/16 0907  BP: (!) 97/44 96/74 (!) 101/50   Pulse:   80   Resp: (!) 21 (!) 25 (!) 23   Temp: 99 F (37.2 C) 98.8 F (37.1 C) 98.8 F (37.1 C) 98.8 F (37.1 C)  TempSrc: Oral Oral Oral Oral  SpO2:      Weight:      Height:        Intake/Output Summary (Last 24 hours) at 02/09/16 0948 Last data filed at 02/09/16 0754  Gross per 24 hour  Intake             1835 ml  Output              600 ml  Net             1235 ml   Filed Weights   02/08/16 1339 02/09/16 0328 02/09/16 0618  Weight: 177 lb 11.2 oz (80.6 kg) 184 lb (83.5 kg) 185 lb 6.4 oz (84.1 kg)    Physical Exam    Patient is feeling much better since yesterday after receiving fluid and blood. Lungs are clear. Respiratory effort is not labored. She is lying flat in bed. Cardiac exam reveals crisp closure of her mechanical mitral prosthesis. She has a systolic outflow murmur. The abdomen is soft. There is no peripheral edema. Labs    CBC  Recent Labs  02/08/16 1427 02/09/16 0013  WBC 5.9 4.0  NEUTROABS 5.1  --   HGB 7.2* 5.7*  HCT 22.0* 17.3*  MCV 94.4 94.0  PLT 65* 46*   Basic Metabolic Panel  Recent Labs  02/08/16 1427 02/09/16 0128  NA 137 135  K 3.8 3.4*  CL 97* 99*  CO2 31 29  GLUCOSE 176* 162*  BUN 62* 58*  CREATININE 1.85* 1.59*  CALCIUM 8.7* 8.1*  MG 1.8  --    Liver Function Tests  Recent Labs  02/08/16 1427  AST 49*  ALT 28  ALKPHOS 94  BILITOT 2.8*  PROT 5.7*  ALBUMIN 2.5*   No results for input(s): LIPASE, AMYLASE  in the last 72 hours. Cardiac Enzymes No results for input(s): CKTOTAL, CKMB, CKMBINDEX, TROPONINI in the last 72 hours. BNP Invalid input(s): POCBNP D-Dimer No results for input(s): DDIMER in the last 72 hours. Hemoglobin A1C No results for input(s): HGBA1C in the last 72 hours. Fasting Lipid Panel No results for input(s): CHOL, HDL, LDLCALC, TRIG, CHOLHDL, LDLDIRECT in the last 72 hours. Thyroid Function Tests No results for input(s): TSH, T4TOTAL, T3FREE, THYROIDAB in the last 72 hours.  Invalid input(s): FREET3  Telemetry    - Personally Reviewed     There is normal sinus rhythm with PVCs.  ECG      Radiology    No results found.  Cardiac Studies    Patient Profile     The patient is here with signs of dehydration with supra therapeutic INR and a low hemoglobin. She is improving with treatment.  Assessment & Plan      Dehydration     Her symptoms of decreased volume are related to her volume status including her low hemoglobin.    Persistent atrial fibrillation (HCC)     The patient had been cardioverted and was  holding sinus rhythm yesterday. She has sinus rhythm today.    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 Most recently the valves have been working well.        Chronic combined systolic and diastolic CHF (congestive heart failure) (HCC)      The patient had been receiving diuretics. At this time she is on the dry side.    Pulmonary HTN    CKD (chronic kidney disease) stage 3, GFR 30-59 ml/min    Creatinine was up to 1.85 yesterday. This is slightly higher than recent values. She has had higher numbers in the remote past. Today creatinine is improved to 1.59.    Cardiac cirrhosis    Long term current use of amiodarone    Amiodarone is being continued.    Supratherapeutic INR       The patient was confused about her home dosing. She did receive 5 mg of vitamin K yesterday. INR is improving. We must be very careful with this as she has a mechanical prosthesis in the mitral position.    Anemia     Hemoglobin dropped to the range of 5.7. We are not seeing active bleeding but this is quite concerning with the eye INR. She is receiving blood transfusion and tolerating this well.   Willa RoughJeffrey Filicia Scogin, MD  02/09/2016, 9:48 AM

## 2016-02-10 DIAGNOSIS — N183 Chronic kidney disease, stage 3 (moderate): Secondary | ICD-10-CM

## 2016-02-10 LAB — CBC
HEMATOCRIT: 23.6 % — AB (ref 36.0–46.0)
Hemoglobin: 7.8 g/dL — ABNORMAL LOW (ref 12.0–15.0)
MCH: 30.8 pg (ref 26.0–34.0)
MCHC: 33.1 g/dL (ref 30.0–36.0)
MCV: 93.3 fL (ref 78.0–100.0)
PLATELETS: 45 10*3/uL — AB (ref 150–400)
RBC: 2.53 MIL/uL — ABNORMAL LOW (ref 3.87–5.11)
RDW: 18.4 % — AB (ref 11.5–15.5)
WBC: 4.7 10*3/uL (ref 4.0–10.5)

## 2016-02-10 LAB — TYPE AND SCREEN
ABO/RH(D): A POS
ANTIBODY SCREEN: POSITIVE
DAT, IgG: NEGATIVE
Donor AG Type: NEGATIVE
Donor AG Type: NEGATIVE
PT AG TYPE: NEGATIVE
UNIT DIVISION: 0
Unit division: 0

## 2016-02-10 LAB — BASIC METABOLIC PANEL
Anion gap: 6 (ref 5–15)
BUN: 47 mg/dL — AB (ref 6–20)
CALCIUM: 8.4 mg/dL — AB (ref 8.9–10.3)
CO2: 27 mmol/L (ref 22–32)
CREATININE: 1.42 mg/dL — AB (ref 0.44–1.00)
Chloride: 106 mmol/L (ref 101–111)
GFR calc Af Amer: 44 mL/min — ABNORMAL LOW (ref >60)
GFR, EST NON AFRICAN AMERICAN: 38 mL/min — AB (ref >60)
GLUCOSE: 142 mg/dL — AB (ref 65–99)
POTASSIUM: 3.4 mmol/L — AB (ref 3.5–5.1)
SODIUM: 139 mmol/L (ref 135–145)

## 2016-02-10 LAB — PROTIME-INR
INR: 2.01
PROTHROMBIN TIME: 23.1 s — AB (ref 11.4–15.2)

## 2016-02-10 LAB — HEPARIN LEVEL (UNFRACTIONATED): HEPARIN UNFRACTIONATED: 0.1 [IU]/mL — AB (ref 0.30–0.70)

## 2016-02-10 MED ORDER — WARFARIN - PHARMACIST DOSING INPATIENT: Freq: Every day | Status: DC

## 2016-02-10 MED ORDER — HEPARIN (PORCINE) IN NACL 100-0.45 UNIT/ML-% IJ SOLN
1500.0000 [IU]/h | INTRAMUSCULAR | Status: DC
  Administered 2016-02-10: 950 [IU]/h via INTRAVENOUS
  Filled 2016-02-10 (×2): qty 250

## 2016-02-10 MED ORDER — WARFARIN SODIUM 5 MG PO TABS
5.0000 mg | ORAL_TABLET | Freq: Once | ORAL | Status: AC
  Administered 2016-02-10: 5 mg via ORAL
  Filled 2016-02-10: qty 1

## 2016-02-10 NOTE — Progress Notes (Signed)
ANTICOAGULATION CONSULT NOTE - Follow Up Consult  Pharmacy Consult for Heparin and Coumadin Indication: mechanical valves  Allergies  Allergen Reactions  . Adhesive [Tape] Itching    Adhesive tape and backing (pls use elastic bandages with no adhesive)  . Spironolactone Other (See Comments)    Severe dizziness and nausea  . Penicillins Rash    Has patient had a PCN reaction causing immediate rash, facial/tongue/throat swelling, SOB or lightheadedness with hypotension: Yes Has patient had a PCN reaction causing severe rash involving mucus membranes or skin necrosis: No Has patient had a PCN reaction that required hospitalization No Has patient had a PCN reaction occurring within the last 10 years: No If all of the above answers are "NO", then may proceed with Cephalosporin use.   . Sulfa Antibiotics Rash    Patient Measurements: Height: 4\' 11"  (149.9 cm) Weight: 185 lb (83.9 kg) IBW/kg (Calculated) : 43.2  Vital Signs: Temp: 97.7 F (36.5 C) (11/10 0459) Temp Source: Oral (11/10 0459) BP: 122/60 (11/10 0806) Pulse Rate: 67 (11/10 0459)  Labs:  Recent Labs  02/08/16 1427 02/09/16 0013 02/09/16 0128 02/09/16 2212 02/10/16 0418  HGB 7.2* 5.7*  --  7.8* 7.8*  HCT 22.0* 17.3*  --  24.4* 23.6*  PLT 65* 46*  --  48* 45*  LABPROT >90.0* 70.4*  --  24.9*  --   INR >10.00* 8.11*  --  2.21  --   CREATININE 1.85*  --  1.59*  --  1.42*    Estimated Creatinine Clearance: 37.1 mL/min (by C-G formula based on SCr of 1.42 mg/dL (H)).  Assessment: 65yo female with mechanical heart valves who presented with supratherapeutic INR which is now subtherapeutic after Vit K 5mg  po x 1 on 11/8.  Hg is stable x 2 after PRBC x 2 on 11/9 AM.  Pltc remain low but are consistent with historical values.  There is no active bleeding.  I have discussed pt with DrKatz- we will add heparin without bolus dosing and d/c ASA.  I also clarified INR goal with him.  Goal of Therapy:  Heparin level  0.3-0.7 units/ml INR 2.5-3.5 Monitor platelets by anticoagulation protocol: Yes   Plan:  Coumadin 5mg  po x 1 Heparin per Rx, no bolus dosing Heparin 950 units/hr (15 uts/kg/hr) Heparin level 8hr Daily HL, CBC, INR Watch for s/s of bleeding   Jamie Howell, PharmD Clinical Pharmacist [facility]

## 2016-02-10 NOTE — Progress Notes (Signed)
ANTICOAGULATION CONSULT NOTE - Follow Up Consult  Pharmacy Consult for Heparin and Coumadin Indication: mechanical valves  Allergies  Allergen Reactions  . Adhesive [Tape] Itching    Adhesive tape and backing (pls use elastic bandages with no adhesive)  . Spironolactone Other (See Comments)    Severe dizziness and nausea  . Penicillins Rash    Has patient had a PCN reaction causing immediate rash, facial/tongue/throat swelling, SOB or lightheadedness with hypotension: Yes Has patient had a PCN reaction causing severe rash involving mucus membranes or skin necrosis: No Has patient had a PCN reaction that required hospitalization No Has patient had a PCN reaction occurring within the last 10 years: No If all of the above answers are "NO", then may proceed with Cephalosporin use.   . Sulfa Antibiotics Rash    Patient Measurements: Height: 4\' 11"  (149.9 cm) Weight: 185 lb (83.9 kg) IBW/kg (Calculated) : 43.2  Vital Signs: Temp: 97.2 F (36.2 C) (11/10 1329) Temp Source: Oral (11/10 1329) BP: 116/51 (11/10 1329) Pulse Rate: 62 (11/10 1329)  Labs:  Recent Labs  02/08/16 1427 02/09/16 0013 02/09/16 0128 02/09/16 2212 02/10/16 0418 02/10/16 0945 02/10/16 1831  HGB 7.2* 5.7*  --  7.8* 7.8*  --   --   HCT 22.0* 17.3*  --  24.4* 23.6*  --   --   PLT 65* 46*  --  48* 45*  --   --   LABPROT >90.0* 70.4*  --  24.9*  --  23.1*  --   INR >10.00* 8.11*  --  2.21  --  2.01  --   HEPARINUNFRC  --   --   --   --   --   --  0.10*  CREATININE 1.85*  --  1.59*  --  1.42*  --   --     Estimated Creatinine Clearance: 37.1 mL/min (by C-G formula based on SCr of 1.42 mg/dL (H)).  Assessment: 65yo female with mechanical heart valves who presented with supratherapeutic INR which is now subtherapeutic after Vit K 5mg  po x 1 on 11/8. Currently with anemia and thrombocytopenia.  Initial heparin level is subtherapeutic at 0.10.     Goal of Therapy:  Heparin level 0.3-0.7 units/ml INR  2.5-3.5 Monitor platelets by anticoagulation protocol: Yes   Plan:  1. Increase heparin infusion to 1200 units/hr 2. Heparin level 8hrs 3. Daily HL, CBC, INR 4. Watch for s/s of bleeding   Pollyann SamplesAndy Marna Weniger, PharmD, BCPS 02/10/2016, 7:46 PM Pager: [phone]

## 2016-02-10 NOTE — Progress Notes (Addendum)
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 She has been receiving IV fluid. I point to be sure that she does not become volume overloaded.  Inpatient Medications    Scheduled Meds: . sodium chloride   Intravenous Once  . amiodarone  200 mg Oral Daily  . aspirin EC  81 mg Oral Daily  . calcium carbonate   Oral Q lunch  . cyclobenzaprine  5 mg Oral QHS  . ferrous sulfate  325 mg Oral Q breakfast  . levothyroxine  150 mcg Oral QAC breakfast  . metoprolol succinate  25 mg Oral Daily   Continuous Infusions: . sodium chloride 75 mL/hr (02/09/16 1232)   PRN Meds: ondansetron **OR** ondansetron (ZOFRAN) IV   Vital Signs    Vitals:   02/09/16 2041 02/10/16 0400 02/10/16 0459 02/10/16 0806  BP: (!) 107/53  (!) 102/57 122/60  Pulse: 71  67   Resp: (!) 22  15   Temp: 98 F (36.7 C)  97.7 F (36.5 C)   TempSrc: Oral  Oral   SpO2: 97%  98%   Weight:  185 lb (83.9 kg)    Height:        Intake/Output Summary (Last 24 hours) at 02/10/16 1013 Last data filed at 02/10/16 0854  Gross per 24 hour  Intake              455 ml  Output             1500 ml  Net            -1045 ml   Filed Weights   02/09/16 0328 02/09/16 0618 02/10/16 0400  Weight: 184 lb (83.5 kg) 185 lb 6.4 oz (84.1 kg) 185 lb (83.9 kg)    Physical Exam    The patient is pale. She is comfortably lying flat in bed. Lungs reveal a few facial rales. Cardiac exam  reveals crisp closure of her mitral prosthesis with a systolic outflow murmur. The abdomen is soft. She does not have peripheral edema. She has ecchymoses.  Labs    CBC  Recent Labs  02/08/16 1427  02/09/16 2212 02/10/16 0418  WBC 5.9  < > 4.4 4.7  NEUTROABS 5.1  --   --   --   HGB 7.2*  < > 7.8* 7.8*  HCT 22.0*  < > 24.4* 23.6*  MCV 94.4  < > 92.8 93.3  PLT 65*  < > 48* 45*  < > = values in this interval not displayed. Basic Metabolic Panel  Recent Labs  02/08/16 1427 02/09/16 0128 02/10/16 0418  NA 137 135 139  K 3.8 3.4* 3.4*  CL 97* 99* 106  CO2 31 29 27   GLUCOSE 176* 162* 142*  BUN 62* 58* 47*  CREATININE 1.85* 1.59* 1.42*  CALCIUM 8.7* 8.1* 8.4*  MG 1.8  --   --    Liver Function Tests  Recent  Labs  02/08/16 1427  AST 49*  ALT 28  ALKPHOS 94  BILITOT 2.8*  PROT 5.7*  ALBUMIN 2.5*   No results for input(s): LIPASE, AMYLASE in the last 72 hours. Cardiac Enzymes No results for input(s): CKTOTAL, CKMB, CKMBINDEX, TROPONINI in the last 72 hours. BNP Invalid input(s): POCBNP D-Dimer No results for input(s): DDIMER in the last 72 hours. Hemoglobin A1C No results for input(s): HGBA1C in the last 72 hours. Fasting Lipid Panel No results for input(s): CHOL, HDL, LDLCALC, TRIG, CHOLHDL, LDLDIRECT in the last 72 hours. Thyroid Function Tests No results for input(s): TSH, T4TOTAL, T3FREE, THYROIDAB in the last 72 hours.  Invalid input(s): FREET3  Telemetry    Personally Reviewed   Patient has normal sinus rhythm  ECG    Radiology    No results found.  Cardiac Studies     Patient Profile     The patient is being treated for supratherapeutic INR, anemia, mild dehydration, chronic valvular heart disease.  Assessment & Plan      Dehydration    She had some recurrent dizziness last night. Her blood pressure is stable. I'm hesitant to continue IV fluids. Continue to follow the patient.    Persistent atrial fibrillation (HCC)     The patient  had been cardioverted and was holding sinus rhythm yesterday. She has sinus rhythm today.    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 Most recently the valves have been working well.        Chronic combined systolic and diastolic CHF (congestive heart failure) (HCC)     The patient was on a diuretic as an outpatient. This has been held since admission. She is also been receiving IV fluids. Today I'm stopping her IV fluids. We need to reassess her volume status daily to decide if and when her diuretics can be resumed.    Pulmonary HTN    CKD (chronic kidney disease) stage 3, GFR 30-59 ml/min    Creatinine was up to 1.85 yesterday. This is slightly higher than recent values. She has had higher numbers in the remote past. Creatinine continues to improve today to 1.45    Cardiac cirrhosis    Long term current use of amiodarone    Amiodarone is being continued.    Supratherapeutic INR       The patient was confused about her home dosing. She did receive 5 mg of vitamin K yesterday. INR has now come down to 2.2. I have spoken with pharmacy. We both agree that heparin should be started today with no bolus. Also her Coumadin dose should be restarted. The goal will be an INR of 2.5-3.5. The patient has been on Coumadin and aspirin chronically. With all of the current issues, I have decided to stop the aspirin. This issue will have to be readdressed at a later date by his outpatient cardiologist.    Anemia     Hemoglobin dropped to the range of 5.7. We are not seeing active bleeding but this is quite concerning with the elevated INR. Patient was transfused. Hemoglobin is now 7.8. She is receiving iron. Her baseline hemoglobin is in the 10.0 range. I have considered further transfusion, but I've chosen not to do this.    Thrombocytopenia      The patient has chronic thrombocytopenia. These values are being watched very carefully by pharmacy.  Overall the patient is more stable then  at the time of admission. I'm still concerned about her low hemoglobin, mild dizziness  when sitting, and her volume status. Since she received vitamin K, it may take time for her INR to become therapeutic again. We will have to be very careful with this as she has mechanical valve in the mitral position.   Willa Rough, MD  02/10/2016, 10:13 AM

## 2016-02-11 LAB — BASIC METABOLIC PANEL
ANION GAP: 6 (ref 5–15)
BUN: 36 mg/dL — AB (ref 6–20)
CALCIUM: 8 mg/dL — AB (ref 8.9–10.3)
CO2: 26 mmol/L (ref 22–32)
Chloride: 105 mmol/L (ref 101–111)
Creatinine, Ser: 1.42 mg/dL — ABNORMAL HIGH (ref 0.44–1.00)
GFR calc Af Amer: 44 mL/min — ABNORMAL LOW (ref >60)
GFR, EST NON AFRICAN AMERICAN: 38 mL/min — AB (ref >60)
Glucose, Bld: 207 mg/dL — ABNORMAL HIGH (ref 65–99)
POTASSIUM: 3.3 mmol/L — AB (ref 3.5–5.1)
SODIUM: 137 mmol/L (ref 135–145)

## 2016-02-11 LAB — CBC
HEMATOCRIT: 22.2 % — AB (ref 36.0–46.0)
Hemoglobin: 7.3 g/dL — ABNORMAL LOW (ref 12.0–15.0)
MCH: 30.7 pg (ref 26.0–34.0)
MCHC: 32.9 g/dL (ref 30.0–36.0)
MCV: 93.3 fL (ref 78.0–100.0)
Platelets: 45 10*3/uL — ABNORMAL LOW (ref 150–400)
RBC: 2.38 MIL/uL — AB (ref 3.87–5.11)
RDW: 18.2 % — ABNORMAL HIGH (ref 11.5–15.5)
WBC: 4.2 10*3/uL (ref 4.0–10.5)

## 2016-02-11 LAB — HEPARIN LEVEL (UNFRACTIONATED)
HEPARIN UNFRACTIONATED: 0.29 [IU]/mL — AB (ref 0.30–0.70)
HEPARIN UNFRACTIONATED: 0.38 [IU]/mL (ref 0.30–0.70)
Heparin Unfractionated: 0.15 IU/mL — ABNORMAL LOW (ref 0.30–0.70)

## 2016-02-11 LAB — PROTIME-INR
INR: 2.22
Prothrombin Time: 25 seconds — ABNORMAL HIGH (ref 11.4–15.2)

## 2016-02-11 MED ORDER — WARFARIN SODIUM 5 MG PO TABS
5.0000 mg | ORAL_TABLET | Freq: Once | ORAL | Status: AC
  Administered 2016-02-11: 5 mg via ORAL
  Filled 2016-02-11: qty 1

## 2016-02-11 MED ORDER — POTASSIUM CHLORIDE CRYS ER 20 MEQ PO TBCR
40.0000 meq | EXTENDED_RELEASE_TABLET | Freq: Once | ORAL | Status: AC
  Administered 2016-02-11: 40 meq via ORAL
  Filled 2016-02-11: qty 2

## 2016-02-11 NOTE — Progress Notes (Signed)
ANTICOAGULATION CONSULT NOTE - Follow Up Consult  Pharmacy Consult for Heparin and Coumadin Indication: mechanical valves  Allergies  Allergen Reactions  . Adhesive [Tape] Itching    Adhesive tape and backing (pls use elastic bandages with no adhesive)  . Spironolactone Other (See Comments)    Severe dizziness and nausea  . Penicillins Rash    Has patient had a PCN reaction causing immediate rash, facial/tongue/throat swelling, SOB or lightheadedness with hypotension: Yes Has patient had a PCN reaction causing severe rash involving mucus membranes or skin necrosis: No Has patient had a PCN reaction that required hospitalization No Has patient had a PCN reaction occurring within the last 10 years: No If all of the above answers are "NO", then may proceed with Cephalosporin use.   . Sulfa Antibiotics Rash    Patient Measurements: Height: 4\' 11"  (149.9 cm) Weight: 188 lb 3.2 oz (85.4 kg) IBW/kg (Calculated) : 43.2  Heparin Dosing Weight: 65kg (based on IBW of 45.5 kgs)  Vital Signs: Temp: 98 F (36.7 C) (11/11 0457) Temp Source: Oral (11/11 0457) BP: 92/52 (11/11 0850) Pulse Rate: 65 (11/11 0850)  Labs:  Recent Labs  02/09/16 0128 02/09/16 2212 02/10/16 0418 02/10/16 0945 02/10/16 1831 02/11/16 0344 02/11/16 0945 02/11/16 1151  HGB  --  7.8* 7.8*  --   --  7.3*  --   --   HCT  --  24.4* 23.6*  --   --  22.2*  --   --   PLT  --  48* 45*  --   --  45*  --   --   LABPROT  --  24.9*  --  23.1*  --  25.0*  --   --   INR  --  2.21  --  2.01  --  2.22  --   --   HEPARINUNFRC  --   --   --   --  0.10* 0.15*  --  0.29*  CREATININE 1.59*  --  1.42*  --   --   --  1.42*  --     Estimated Creatinine Clearance: 37.5 mL/min (by C-G formula based on SCr of 1.42 mg/dL (H)).  Assessment: 65 y.o. female with mechanical MVR and TAVR for anticoagulation/  No bleeding noted per RN.  Goal of Therapy:  Heparin level 0.3-0.7 units/ml INR 2.5-3.5 Monitor platelets by  anticoagulation protocol: Yes   Plan:  -Increase Heparin to 1500 units/hr -Check heparin level in 6 hours.  -Coumadin 5 mg tonight -Daily INR, CBC -Monitor for S/Sx of bleeding  Gwyndolyn KaufmanKai Raeshawn Tafolla Bernette Redbird(Kenny), PharmD  PGY1 Pharmacy Resident Pager: [phone] 02/11/2016 12:38 PM

## 2016-02-11 NOTE — Progress Notes (Signed)
ANTICOAGULATION CONSULT NOTE - Follow Up Consult  Pharmacy Consult for Heparin and Coumadin Indication: mechanical valves  Allergies  Allergen Reactions  . Adhesive [Tape] Itching    Adhesive tape and backing (pls use elastic bandages with no adhesive)  . Spironolactone Other (See Comments)    Severe dizziness and nausea  . Penicillins Rash    Has patient had a PCN reaction causing immediate rash, facial/tongue/throat swelling, SOB or lightheadedness with hypotension: Yes Has patient had a PCN reaction causing severe rash involving mucus membranes or skin necrosis: No Has patient had a PCN reaction that required hospitalization No Has patient had a PCN reaction occurring within the last 10 years: No If all of the above answers are "NO", then may proceed with Cephalosporin use.   . Sulfa Antibiotics Rash    Patient Measurements: Height: 4\' 11"  (149.9 cm) Weight: 185 lb (83.9 kg) IBW/kg (Calculated) : 43.2  Vital Signs: Temp: 98.3 F (36.8 C) (11/10 2100) Temp Source: Oral (11/10 2100) BP: 99/50 (11/10 2100) Pulse Rate: 68 (11/10 2100)  Labs:  Recent Labs  02/08/16 1427  02/09/16 0128 02/09/16 2212 02/10/16 0418 02/10/16 0945 02/10/16 1831 02/11/16 0344  HGB 7.2*  < >  --  7.8* 7.8*  --   --  7.3*  HCT 22.0*  < >  --  24.4* 23.6*  --   --  22.2*  PLT 65*  < >  --  48* 45*  --   --  45*  LABPROT >90.0*  < >  --  24.9*  --  23.1*  --  25.0*  INR >10.00*  < >  --  2.21  --  2.01  --  2.22  HEPARINUNFRC  --   --   --   --   --   --  0.10* 0.15*  CREATININE 1.85*  --  1.59*  --  1.42*  --   --   --   < > = values in this interval not displayed.  Estimated Creatinine Clearance: 37.1 mL/min (by C-G formula based on SCr of 1.42 mg/dL (H)).  Assessment: 65 y.o. female with mechanical MVR and TAVR for anticoagulation/  No bleeding noted per RN.  Goal of Therapy:  Heparin level 0.3-0.7 units/ml INR 2.5-3.5 Monitor platelets by anticoagulation protocol: Yes   Plan:   Increase Heparin 1350 units/hr Check heparin level in 8 hours.  Coumadin 5 mg tonight  Geannie RisenGreg Langford Carias, PharmD, BCPS

## 2016-02-11 NOTE — Progress Notes (Signed)
ANTICOAGULATION CONSULT NOTE - Follow Up Consult  Pharmacy Consult for Heparin and Coumadin Indication: mechanical valves  Allergies  Allergen Reactions  . Adhesive [Tape] Itching    Adhesive tape and backing (pls use elastic bandages with no adhesive)  . Spironolactone Other (See Comments)    Severe dizziness and nausea  . Penicillins Rash    Has patient had a PCN reaction causing immediate rash, facial/tongue/throat swelling, SOB or lightheadedness with hypotension: Yes Has patient had a PCN reaction causing severe rash involving mucus membranes or skin necrosis: No Has patient had a PCN reaction that required hospitalization No Has patient had a PCN reaction occurring within the last 10 years: No If all of the above answers are "NO", then may proceed with Cephalosporin use.   . Sulfa Antibiotics Rash    Patient Measurements: Height: 4\' 11"  (149.9 cm) Weight: 188 lb 3.2 oz (85.4 kg) IBW/kg (Calculated) : 43.2  Heparin Dosing Weight: 65kg (based on IBW of 45.5 kgs)  Vital Signs: Temp: 97.5 F (36.4 C) (11/11 1300) Temp Source: Oral (11/11 1300) BP: 95/54 (11/11 1300) Pulse Rate: 61 (11/11 1300)  Labs:  Recent Labs  02/09/16 0128 02/09/16 2212 02/10/16 0418 02/10/16 0945  02/11/16 0344 02/11/16 0945 02/11/16 1151 02/11/16 1834  HGB  --  7.8* 7.8*  --   --  7.3*  --   --   --   HCT  --  24.4* 23.6*  --   --  22.2*  --   --   --   PLT  --  48* 45*  --   --  45*  --   --   --   LABPROT  --  24.9*  --  23.1*  --  25.0*  --   --   --   INR  --  2.21  --  2.01  --  2.22  --   --   --   HEPARINUNFRC  --   --   --   --   < > 0.15*  --  0.29* 0.38  CREATININE 1.59*  --  1.42*  --   --   --  1.42*  --   --   < > = values in this interval not displayed.  Estimated Creatinine Clearance: 37.5 mL/min (by C-G formula based on SCr of 1.42 mg/dL (H)).  Assessment: 65 y.o. female with mechanical MVR and TAVR for anticoagulation/  No bleeding noted per RN.  HL - 0.38 on 1500  units/hr  Goal of Therapy:  Heparin level 0.3-0.7 units/ml INR 2.5-3.5 Monitor platelets by anticoagulation protocol: Yes   Plan:  -Continue Heparin 1500 units/hr -Coumadin 5 mg tonight -Daily INR, CBC -Monitor for S/Sx of bleeding  Isaac BlissMichael Kody Vigil, PharmD, BCPS, [facility] Clinical Pharmacist Pager [phone] 02/11/2016 7:30 PM

## 2016-02-11 NOTE — Progress Notes (Signed)
Patient Name: Jamie Howell C Teicher Date of Encounter: 02/11/2016  Primary Cardiologist:   Hospital Problem List     Principal Problem:   Dehydration Active Problems:   Persistent atrial fibrillation (HCC)   Rheumatic heart disease s/p Mechanical MVR and TAVR   Chronic combined systolic and diastolic CHF (congestive heart failure) (HCC)   Pulmonary HTN   Thrombocytopenia (HCC)   CKD (chronic kidney disease) stage 3, GFR 30-59 ml/min   Cardiac cirrhosis   Long term current use of amiodarone   Supratherapeutic INR   Anemia  Thrombocytopenia   Subjective   No dizziness or dyspnea only ambulated to bathroom INR sub Rx 2.2 on heparin   Inpatient Medications    Scheduled Meds: . sodium chloride   Intravenous Once  . amiodarone  200 mg Oral Daily  . calcium carbonate   Oral Q lunch  . cyclobenzaprine  5 mg Oral QHS  . ferrous sulfate  325 mg Oral Q breakfast  . levothyroxine  150 mcg Oral QAC breakfast  . metoprolol succinate  25 mg Oral Daily  . warfarin  5 mg Oral ONCE-1800  . Warfarin - Pharmacist Dosing Inpatient   Does not apply q1800   Continuous Infusions: . heparin 1,350 Units/hr (02/11/16 0457)   PRN Meds: ondansetron **OR** ondansetron (ZOFRAN) IV   Vital Signs    Vitals:   02/10/16 0806 02/10/16 1329 02/10/16 2100 02/11/16 0457  BP: 122/60 (!) 116/51 (!) 99/50 98/66  Pulse:  62 68 66  Resp:  18 18 16   Temp:  97.2 F (36.2 C) 98.3 F (36.8 C) 98 F (36.7 C)  TempSrc:  Oral Oral Oral  SpO2:  99% 96%   Weight:    85.4 kg (188 lb 3.2 oz)  Height:        Intake/Output Summary (Last 24 hours) at 02/11/16 0935 Last data filed at 02/11/16 0855  Gross per 24 hour  Intake           907.58 ml  Output             1300 ml  Net          -392.42 ml   Filed Weights   02/09/16 0618 02/10/16 0400 02/11/16 0457  Weight: 84.1 kg (185 lb 6.4 oz) 83.9 kg (185 lb) 85.4 kg (188 lb 3.2 oz)    Physical Exam    Affect appropriate Healthy:  appears stated  age HEENT: normal Neck supple with no adenopathy JVP normal no bruits no thyromegaly Lungs clear with no wheezing and good diaphragmatic motion Heart:  S1 click /S2  SEM through TAVR valve   , no rub, gallop or click PMI normal Abdomen: benighn, BS positve, no tenderness, no AAA no bruit.  No HSM or HJR Distal pulses intact with no bruits No edema Neuro non-focal Skin warm and dry No muscular weakness   Labs    CBC  Recent Labs  02/08/16 1427  02/10/16 0418 02/11/16 0344  WBC 5.9  < > 4.7 4.2  NEUTROABS 5.1  --   --   --   HGB 7.2*  < > 7.8* 7.3*  HCT 22.0*  < > 23.6* 22.2*  MCV 94.4  < > 93.3 93.3  PLT 65*  < > 45* 45*  < > = values in this interval not displayed. Basic Metabolic Panel  Recent Labs  02/08/16 1427 02/09/16 0128 02/10/16 0418  NA 137 135 139  K 3.8 3.4* 3.4*  CL 97* 99* 106  CO2 31 29 27   GLUCOSE 176* 162* 142*  BUN 62* 58* 47*  CREATININE 1.85* 1.59* 1.42*  CALCIUM 8.7* 8.1* 8.4*  MG 1.8  --   --    Liver Function Tests  Recent Labs  02/08/16 1427  AST 49*  ALT 28  ALKPHOS 94  BILITOT 2.8*  PROT 5.7*  ALBUMIN 2.5*     Telemetry    Personally Reviewed   Patient has normal sinus rhythm  ECG    Radiology    No results found.  Cardiac Studies     Patient Profile     The patient is being treated for supratherapeutic INR, anemia, mild dehydration, chronic valvular heart disease.  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 Most recently the valves have been working well.        Chronic combined systolic and diastolic CHF (congestive heart failure) (HCC)    Stable dry on admission weight stable likely resume home demedex with no zaroxyln or aldactone on d/c     Pulmonary HTN    CKD  (chronic kidney disease) stage 3, GFR 30-59 ml/min    improved 1.45 check BMET in am     Long term current use of amiodarone    Amiodarone is being continued.    Anemia     Hemoglobin dropped to the range of 5.7. We are not seeing active bleeding but this is quite concerning with the elevated INR. Patient was transfused. Hemoglobin is now 7.8. She is receiving iron. Her baseline hemoglobin is in the 10.0 range. I have considered further transfusion, but I've chosen not to do this.    Thrombocytopenia      The patient has chronic thrombocytopenia. These values are being watched very carefully by pharmacy.  Possible d/c in am if INR Rx    Charlton HawsPeter Damek Ende, MD  02/11/2016, 9:35 AM  Patient ID: Jamie DessIrene C Cea, female   DOB: 04/28/1950, 65 y.o.   MRN: [id number]

## 2016-02-12 LAB — CBC
HCT: 22.2 % — ABNORMAL LOW (ref 36.0–46.0)
Hemoglobin: 7.3 g/dL — ABNORMAL LOW (ref 12.0–15.0)
MCH: 31.3 pg (ref 26.0–34.0)
MCHC: 32.9 g/dL (ref 30.0–36.0)
MCV: 95.3 fL (ref 78.0–100.0)
PLATELETS: 50 10*3/uL — AB (ref 150–400)
RBC: 2.33 MIL/uL — AB (ref 3.87–5.11)
RDW: 18.4 % — ABNORMAL HIGH (ref 11.5–15.5)
WBC: 4.2 10*3/uL (ref 4.0–10.5)

## 2016-02-12 LAB — BASIC METABOLIC PANEL
Anion gap: 7 (ref 5–15)
BUN: 31 mg/dL — AB (ref 6–20)
CO2: 26 mmol/L (ref 22–32)
CREATININE: 1.42 mg/dL — AB (ref 0.44–1.00)
Calcium: 7.8 mg/dL — ABNORMAL LOW (ref 8.9–10.3)
Chloride: 105 mmol/L (ref 101–111)
GFR calc Af Amer: 44 mL/min — ABNORMAL LOW (ref >60)
GFR, EST NON AFRICAN AMERICAN: 38 mL/min — AB (ref >60)
Glucose, Bld: 143 mg/dL — ABNORMAL HIGH (ref 65–99)
POTASSIUM: 3.7 mmol/L (ref 3.5–5.1)
SODIUM: 138 mmol/L (ref 135–145)

## 2016-02-12 LAB — PROTIME-INR
INR: 3.4
PROTHROMBIN TIME: 35.2 s — AB (ref 11.4–15.2)

## 2016-02-12 LAB — HEPARIN LEVEL (UNFRACTIONATED): HEPARIN UNFRACTIONATED: 0.39 [IU]/mL (ref 0.30–0.70)

## 2016-02-12 LAB — OCCULT BLOOD X 1 CARD TO LAB, STOOL: FECAL OCCULT BLD: POSITIVE — AB

## 2016-02-12 MED ORDER — WARFARIN SODIUM 2.5 MG PO TABS
2.5000 mg | ORAL_TABLET | Freq: Once | ORAL | Status: AC
  Administered 2016-02-12: 2.5 mg via ORAL
  Filled 2016-02-12: qty 1

## 2016-02-12 NOTE — Evaluation (Signed)
Physical Therapy Evaluation Patient Details Name: Jamie Howell MRN: [id number] DOB: 03/19/1951 Today's Date: 02/12/2016   History of Present Illness  Jamie Howell is a 65 y.o. 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 Pt c/o nausea, dizziness/near syncope. Pt found to have and is being treated for supratherapeutic INR, anemia, mild dehydration, and chronic valvular heart disease.  Clinical Impression  Pt functioning at supervision level but has poor endurance, fatigues quickly. Aware HGB is on low side. Pt unable to do full flight of stairs this date. Will re-attempt tomorrow. Acute PT to follow.    Follow Up Recommendations No PT follow up;Supervision - Intermittent    Equipment Recommendations   (shower seat)    Recommendations for Other Services       Precautions / Restrictions Precautions Precautions: Other (comment) Precaution Comments: soft HGB 7.2 Restrictions Weight Bearing Restrictions: No      Mobility  Bed Mobility Overal bed mobility: Modified Independent             General bed mobility comments: HOB elevated, increased time, use of bed rail  Transfers Overall transfer level: Needs assistance Equipment used: None Transfers: Sit to/from Stand Sit to Stand: Supervision         General transfer comment: safe, no instability  Ambulation/Gait Ambulation/Gait assistance: Supervision Ambulation Distance (Feet): 75 Feet (x2) Assistive device: None Gait Pattern/deviations: Step-through pattern;Decreased stride length Gait velocity: slow Gait velocity interpretation: Below normal speed for age/gender General Gait Details: slow and guard, c/o fatigue, denies dizziness  Stairs Stairs: Yes Stairs  assistance: Min guard Stair Management: One rail Left;Step to pattern Number of Stairs: 4 General stair comments: pt had to stop and sit on the 4th stair due to fatigue, took 5 min seated rest break came back down and walked back to the room  Wheelchair Mobility    Modified Rankin (Stroke Patients Only)       Balance Overall balance assessment: No apparent balance deficits (not formally assessed)                                           Pertinent Vitals/Pain Pain Assessment: No/denies pain    Home Living Family/patient expects to be discharged to:: Private residence Living Arrangements: Children Available Help at Discharge: Family;Friend(s);Available 24 hours/day Type of Home: Apartment Home Access: Stairs to enter Entrance Stairs-Rails: Left Entrance Stairs-Number of Steps: 14 Home Layout: One level Home Equipment: None Additional Comments: son works part time    Prior Function Level of Independence: Independent         Comments: son does the grocery shopping, she was doing her cooking     Hand Dominance   Dominant Hand: Right    Extremity/Trunk Assessment   Upper Extremity Assessment: Generalized weakness           Lower Extremity Assessment: Generalized weakness      Cervical / Trunk Assessment: Normal  Communication   Communication: No difficulties  Cognition Arousal/Alertness: Awake/alert Behavior During Therapy: WFL for tasks assessed/performed Overall Cognitive Status: Within Functional Limits for tasks assessed                      General Comments  General comments (skin integrity, edema, etc.): discussed energy conservation techniques, including getting a shower chair to sit on in shower to improve safety    Exercises     Assessment/Plan    PT Assessment Patient needs continued PT services  PT Problem List Decreased strength;Decreased activity tolerance;Decreased balance;Decreased mobility          PT  Treatment Interventions DME instruction;Gait training;Stair training;Functional mobility training;Therapeutic activities;Therapeutic exercise    PT Goals (Current goals can be found in the Care Plan section)  Acute Rehab PT Goals Patient Stated Goal: home PT Goal Formulation: With patient Time For Goal Achievement: 02/19/16 Potential to Achieve Goals: Good Additional Goals Additional Goal #1: Pt independent with energy conservation techniques.    Frequency Min 3X/week   Barriers to discharge        Co-evaluation               End of Session Equipment Utilized During Treatment: Gait belt Activity Tolerance: Patient limited by fatigue Patient left: in chair;with call bell/phone within reach;with chair alarm set Nurse Communication: Mobility status         Time: 1538-1600 PT Time Calculation (min) (ACUTE ONLY): 22 min   Charges:   PT Evaluation $PT Eval Moderate Complexity: 1 Procedure     PT G CodesMarcene Brawn:        Harlie Buening Marie 02/12/2016, 4:29 PM   Lewis ShockAshly Roshawna Colclasure, PT, DPT Pager #: [phone] Office #: [phone]

## 2016-02-12 NOTE — Progress Notes (Signed)
   Notified by nsg staff that pt felt weak with standing.  Seen by PT - unable to do full flight of stairs.  Also stool sent off and returned guaiac +.  Will keep tonight and f/u cbc in AM. PT to see again tomorrow.  Nicolasa Duckinghristopher Katie Faraone, NP 02/12/2016.5:50 PM

## 2016-02-12 NOTE — Progress Notes (Signed)
ANTICOAGULATION CONSULT NOTE - Follow Up Consult  Pharmacy Consult for Heparin and Coumadin Indication: mechanical valves  Allergies  Allergen Reactions  . Adhesive [Tape] Itching    Adhesive tape and backing (pls use elastic bandages with no adhesive)  . Spironolactone Other (See Comments)    Severe dizziness and nausea  . Penicillins Rash    Has patient had a PCN reaction causing immediate rash, facial/tongue/throat swelling, SOB or lightheadedness with hypotension: Yes Has patient had a PCN reaction causing severe rash involving mucus membranes or skin necrosis: No Has patient had a PCN reaction that required hospitalization No Has patient had a PCN reaction occurring within the last 10 years: No If all of the above answers are "NO", then may proceed with Cephalosporin use.   . Sulfa Antibiotics Rash    Patient Measurements: Height: 4\' 11"  (149.9 cm) Weight: 191 lb 1.6 oz (86.7 kg) IBW/kg (Calculated) : 43.2  Heparin Dosing Weight: 65kg (based on IBW of 45.5 kgs)  Vital Signs: Temp: 98.4 F (36.9 C) (11/12 0500) BP: 91/51 (11/12 0500) Pulse Rate: 65 (11/12 0500)  Labs:  Recent Labs  02/10/16 0418 02/10/16 0945  02/11/16 0344 02/11/16 0945 02/11/16 1151 02/11/16 1834 02/12/16 0415  HGB 7.8*  --   --  7.3*  --   --   --  7.3*  HCT 23.6*  --   --  22.2*  --   --   --  22.2*  PLT 45*  --   --  45*  --   --   --  50*  LABPROT  --  23.1*  --  25.0*  --   --   --  35.2*  INR  --  2.01  --  2.22  --   --   --  3.40  HEPARINUNFRC  --   --   < > 0.15*  --  0.29* 0.38 0.39  CREATININE 1.42*  --   --   --  1.42*  --   --  1.42*  < > = values in this interval not displayed.  Estimated Creatinine Clearance: 37.8 mL/min (by C-G formula based on SCr of 1.42 mg/dL (H)).  Assessment: 65 y.o. female with mechanical MVR and TAVR for anticoagulation -No bleeding noted per RN -Hemoglobin and Plts both low but stable over last few days -INR 3.40 this AM -HL this AM 0.39 on  1500 units/hr  Goal of Therapy:  Heparin level 0.3-0.7 units/ml INR 2.5-3.5 Monitor platelets by anticoagulation protocol: Yes   Plan:  -Continue Heparin 1500 units/hr -Coumadin 2.5 mg tonight -Daily INR, CBC -Monitor for S/Sx of bleeding  Gwyndolyn KaufmanKai Ruthell Feigenbaum Bernette Redbird(Kenny), PharmD  PGY1 Pharmacy Resident Pager: [phone] 02/12/2016 7:28 AM

## 2016-02-12 NOTE — Progress Notes (Signed)
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 sodium chloride   Intravenous Once  . amiodarone  200 mg Oral Daily  . calcium carbonate   Oral Q lunch  . cyclobenzaprine  5 mg Oral QHS  . ferrous sulfate  325 mg Oral Q breakfast  . levothyroxine  150 mcg Oral QAC breakfast  . metoprolol succinate  25 mg Oral Daily  . warfarin  2.5 mg Oral ONCE-1800  . Warfarin - Pharmacist Dosing Inpatient   Does not apply q1800   Continuous Infusions:  PRN Meds: ondansetron **OR** ondansetron (ZOFRAN) IV   Vital Signs    Vitals:   02/11/16 0850 02/11/16 1300 02/11/16 2100 02/12/16 0500  BP: (!) 92/52 (!) 95/54 (!) 101/53 (!) 91/51  Pulse: 65 61 66 65  Resp:  (!) 21 19 19   Temp:  97.5 F (36.4 C) 98 F (36.7 C) 98.4 F (36.9 C)  TempSrc:  Oral    SpO2:  99% 98% 99%  Weight:    86.7 kg (191 lb 1.6 oz)  Height:        Intake/Output Summary (Last 24 hours) at 02/12/16 0904 Last data filed at 02/12/16 0500  Gross per 24 hour  Intake           459.83 ml  Output              550 ml  Net           -90.17 ml   Filed Weights   02/10/16 0400 02/11/16 0457 02/12/16 0500  Weight: 83.9 kg (185 lb) 85.4 kg (188 lb 3.2 oz) 86.7 kg (191 lb 1.6 oz)    Physical Exam    Affect appropriate Healthy:  appears stated age HEENT: normal Neck supple with no adenopathy JVP  normal no bruits no thyromegaly Lungs clear with no wheezing and good diaphragmatic motion Heart:  S1 click /S2  SEM through TAVR valve   , no rub, gallop or click PMI normal Abdomen: benighn, BS positve, no tenderness, no AAA no bruit.  No HSM or HJR Distal pulses intact with no bruits No edema Neuro non-focal Skin warm and dry No muscular weakness   Labs    CBC  Recent Labs  02/11/16 0344 02/12/16 0415  WBC 4.2 4.2  HGB 7.3* 7.3*  HCT 22.2* 22.2*  MCV 93.3 95.3  PLT 45* 50*   Basic Metabolic Panel  Recent Labs  02/11/16 0945 02/12/16 0415  NA 137 138  K 3.3* 3.7  CL 105 105  CO2 26 26  GLUCOSE 207* 143*  BUN 36* 31*  CREATININE 1.42* 1.42*  CALCIUM 8.0* 7.8*   Liver Function Tests No results for input(s): AST, ALT, ALKPHOS, BILITOT, PROT, ALBUMIN in the last 72 hours.   Telemetry    Personally Reviewed   Patient has normal  sinus rhythm  ECG    Radiology    No results found.  Cardiac Studies     Patient Profile     The patient is being treated for supratherapeutic INR, anemia, mild dehydration, chronic valvular heart disease.  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 Most recently the valves have been working well.        Chronic combined systolic and diastolic CHF (congestive heart failure) (HCC)    Stable dry on admission weight stable likely resume home demedex with no zaroxyln or aldactone on d/c     Pulmonary HTN    CKD (chronic kidney disease) stage 3, GFR 30-59 ml/min    improved 1.45 check BMET in am     Long term current use of amiodarone    Amiodarone is being continued.    Anemia     Hemoglobin dropped to the range of 5.7. We are not seeing active bleeding but this is quite concerning with the elevated INR. Patient was  transfused. Hemoglobin is now 7.8. She is receiving iron. Her baseline hemoglobin is in the 10.0 range. I have considered further transfusion, but I've chosen not to do this.    Thrombocytopenia      The patient has chronic thrombocytopenia. These values are being watched very carefully by pharmacy. Tend to run in low 50 range   INR is Rx this am ok to d/c home I stopped heparin this am  Will arrange outpatient f/u Dr Mayford Knifeurner and coumadin clinic   Charlton HawsPeter Zemirah Krasinski, MD  02/12/2016, 9:04 AM  Patient ID: Jamie Howell, female   DOB: 06/06/1950, 65 y.o.   MRN: [id number]

## 2016-02-12 NOTE — Plan of Care (Signed)
Problem: Activity: Goal: Capacity to carry out activities will improve Outcome: Progressing PT Consulted for Evaluation. Ambulates in hallway.  Problem: Cardiac: Goal: Ability to achieve and maintain adequate cardiopulmonary perfusion will improve Outcome: Progressing Positive Occult Blood. Anemic but stable. Continues to have Orthostatic BP's.

## 2016-02-12 NOTE — Progress Notes (Signed)
Positive Occult Blood result on Hemocult Card per Lab. Ward Givenshris Berge, PA notified. Discharge Held for today.

## 2016-02-13 DIAGNOSIS — I099 Rheumatic heart disease, unspecified: Secondary | ICD-10-CM

## 2016-02-13 DIAGNOSIS — I272 Pulmonary hypertension, unspecified: Secondary | ICD-10-CM

## 2016-02-13 DIAGNOSIS — D500 Iron deficiency anemia secondary to blood loss (chronic): Secondary | ICD-10-CM

## 2016-02-13 LAB — CBC
HEMATOCRIT: 22.7 % — AB (ref 36.0–46.0)
Hemoglobin: 7.4 g/dL — ABNORMAL LOW (ref 12.0–15.0)
MCH: 31 pg (ref 26.0–34.0)
MCHC: 32.6 g/dL (ref 30.0–36.0)
MCV: 95 fL (ref 78.0–100.0)
Platelets: 48 10*3/uL — ABNORMAL LOW (ref 150–400)
RBC: 2.39 MIL/uL — ABNORMAL LOW (ref 3.87–5.11)
RDW: 18.5 % — AB (ref 11.5–15.5)
WBC: 3.6 10*3/uL — AB (ref 4.0–10.5)

## 2016-02-13 LAB — FERRITIN: FERRITIN: 68 ng/mL (ref 11–307)

## 2016-02-13 LAB — HEPARIN LEVEL (UNFRACTIONATED): Heparin Unfractionated: <0.1 IU/mL — ABNORMAL LOW (ref 0.30–0.70)

## 2016-02-13 LAB — IRON AND TIBC
IRON: 289 ug/dL — AB (ref 28–170)
Saturation Ratios: 81 % — ABNORMAL HIGH (ref 10.4–31.8)
TIBC: 356 ug/dL (ref 250–450)
UIBC: 67 ug/dL

## 2016-02-13 LAB — PROTIME-INR
INR: 5.14
INR: 5.3 — AB
PROTHROMBIN TIME: 50.1 s — AB (ref 11.4–15.2)
Prothrombin Time: 48.9 seconds — ABNORMAL HIGH (ref 11.4–15.2)

## 2016-02-13 NOTE — Progress Notes (Signed)
CRITICAL VALUE ALERT  Critical value received:  INR 5.30  Date of notification:  02/13/16  Time of notification:  11:40  Critical value read back:Yes.    Nurse who received alert:  Winter Jocelyn Dola Argyleajibola  MD notified (1st page):  Suzzette RighterErin Smith, NP  Time of first page:  11:51  MD notified (2nd page):  Time of second page:  Responding MD:  .  Time MD responded:  .

## 2016-02-13 NOTE — Progress Notes (Signed)
ANTICOAGULATION CONSULT NOTE - Follow Up Consult  Pharmacy Consult for Coumadin Indication: mechanical valves  Allergies  Allergen Reactions  . Adhesive [Tape] Itching    Adhesive tape and backing (pls use elastic bandages with no adhesive)  . Spironolactone Other (See Comments)    Severe dizziness and nausea  . Penicillins Rash    Has patient had a PCN reaction causing immediate rash, facial/tongue/throat swelling, SOB or lightheadedness with hypotension: Yes Has patient had a PCN reaction causing severe rash involving mucus membranes or skin necrosis: No Has patient had a PCN reaction that required hospitalization No Has patient had a PCN reaction occurring within the last 10 years: No If all of the above answers are "NO", then may proceed with Cephalosporin use.   . Sulfa Antibiotics Rash    Patient Measurements: Height: 4\' 11"  (149.9 cm) Weight: 192 lb 9.6 oz (87.4 kg) IBW/kg (Calculated) : 43.2  Heparin Dosing Weight: 65kg (based on IBW of 45.5 kgs)  Vital Signs: Temp: 98.4 F (36.9 C) (11/13 0500) BP: 125/59 (11/13 0919) Pulse Rate: 71 (11/13 0919)  Labs:  Recent Labs  02/11/16 0344 02/11/16 0945  02/11/16 1834 02/12/16 0415 02/13/16 0532  HGB 7.3*  --   --   --  7.3* 7.4*  HCT 22.2*  --   --   --  22.2* 22.7*  PLT 45*  --   --   --  50* 48*  LABPROT 25.0*  --   --   --  35.2* 48.9*  INR 2.22  --   --   --  3.40 5.14*  HEPARINUNFRC 0.15*  --   < > 0.38 0.39 <0.10*  CREATININE  --  1.42*  --   --  1.42*  --   < > = values in this interval not displayed.  Estimated Creatinine Clearance: 38 mL/min (by C-G formula based on SCr of 1.42 mg/dL (H)).  Assessment: 65 y.o. female with mechanical MVR and TAVR for anticoagulation.  Now 11/13 with heme + stool. MD notes patient will need GI work up. -INR back up to 5.14. 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   Goal of Therapy:  INR 2.5-3.5 Monitor platelets by anticoagulation protocol: Yes   Plan:  Hold -Coumadin tonight -Daily INR, CBC -Monitor for S/Sx of bleeding  Jamie Howell, RPh Clinical Pharmacist Pager: [phone] 02/13/2016 11:00 AM

## 2016-02-13 NOTE — Care Management Important Message (Signed)
Important Message  Patient Details  Name: Jamie Howell C Crook MRN: [id number] Date of Birth: 09/29/1950   Medicare Important Message Given:  Yes    Kyla BalzarineShealy, Adryan Shin Abena 02/13/2016, 10:37 AM

## 2016-02-13 NOTE — Progress Notes (Signed)
Physical Therapy Treatment Patient Details Name: Jamie Howell MRN: [id number] DOB: 05/30/1950 Today's Date: 02/13/2016    History of Present Illness Jamie Howell is a 65 y.o. 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 Pt c/o nausea, dizziness/near syncope. Pt found to have and is being treated for supratherapeutic INR, anemia, mild dehydration, and chronic valvular heart disease.    PT Comments    Pt making slow progress due to extreme fatigue. Ambulated 200' today but needed rest break at 100' and did not feel up to retrying stairs. Used RW for energy conservation and recommend rollator for her at home and in community. 2nd level apt will be a challenge for her and will limit her community involvement at this point. Recommend HHPT to bridge the gap between going home and being able to return to independence and community participation. PT will continue to follow.   Follow Up Recommendations  Home health PT;Supervision - Intermittent     Equipment Recommendations  Other (comment) (shower seat, rollator)    Recommendations for Other Services       Precautions / Restrictions Precautions Precautions: Other (comment) Precaution Comments: soft HGB 7.2 Restrictions Weight Bearing Restrictions: No    Mobility  Bed Mobility Overal bed mobility: Modified Independent             General bed mobility comments: increased time, bed flat  Transfers Overall transfer level: Needs assistance Equipment used: Rolling walker (2 wheeled) Transfers: Sit to/from Stand Sit to Stand: Supervision         General transfer comment: vc's for hand placement with RW which was used as energy conservation tool  Ambulation/Gait Ambulation/Gait  assistance: Supervision Ambulation Distance (Feet): 200 Feet Assistive device: Rolling walker (2 wheeled) Gait Pattern/deviations: Step-through pattern;Decreased stride length Gait velocity: decreased Gait velocity interpretation: Below normal speed for age/gender General Gait Details: Pt tolerated increased distance with use of RW but was very fatigued afterwards, one standing rest break after 100', discussed possibility of rollator and she agrees that this would be helpful.    Stairs         General stair comments: pt felt very fatigued and did not feel that she could manage stairs safely today  Wheelchair Mobility    Modified Rankin (Stroke Patients Only)       Balance Overall balance assessment: Needs assistance Sitting-balance support: Feet supported Sitting balance-Leahy Scale: Good     Standing balance support: No upper extremity supported Standing balance-Leahy Scale: Good Standing balance comment: pt steady when first up, limited when fatigued                    Cognition Arousal/Alertness: Awake/alert Behavior During Therapy: WFL for tasks assessed/performed Overall Cognitive Status: Within Functional Limits for tasks assessed                      Exercises      General Comments General comments (skin integrity, edema, etc.): energy conservation handout given to pt      Pertinent Vitals/Pain Pain Assessment: No/denies pain    Home Living                      Prior Function            PT Goals (  current goals can now be found in the care plan section) Acute Rehab PT Goals Patient Stated Goal: home PT Goal Formulation: With patient Time For Goal Achievement: 02/19/16 Potential to Achieve Goals: Good Progress towards PT goals: Progressing toward goals    Frequency    Min 3X/week      PT Plan Discharge plan needs to be updated;Equipment recommendations need to be updated    Co-evaluation             End of  Session Equipment Utilized During Treatment: Gait belt Activity Tolerance: Patient limited by fatigue Patient left: in bed;with bed alarm set;with call bell/phone within reach     Time: 1231-1301 PT Time Calculation (min) (ACUTE ONLY): 30 min  Charges:  $Gait Training: 23-37 mins                    G Codes:     Lyanne CoVictoria Oaklee Sunga, PT  Acute Rehab Services  [phone]  Lyanne CoManess, Zahid Carneiro 02/13/2016, 1:57 PM

## 2016-02-13 NOTE — Progress Notes (Signed)
Patient Name: Jamie Howell Date of Encounter: 02/13/2016  Primary Cardiologist: Dr. Lenise Arenaurner  Hospital Problem List     Principal Problem:   Dehydration Active Problems:   Persistent atrial fibrillation [facility](HCC)   Rheumatic heart disease s/p Mechanical MVR and TAVR   Chronic combined systolic and diastolic CHF (congestive heart failure) (HCC)   Pulmonary HTN   Thrombocytopenia (HCC)   CKD (chronic kidney disease) stage 3, GFR 30-59 ml/min   Cardiac cirrhosis   Long term current use of amiodarone   Supratherapeutic INR   Anemia     Subjective   Tearful. Wants to go home. Denies syncope and presyncope. Still feels weak.   Inpatient Medications    Scheduled Meds: . sodium chloride   Intravenous Once  . amiodarone  200 mg Oral Daily  . calcium carbonate   Oral Q lunch  . cyclobenzaprine  5 mg Oral QHS  . ferrous sulfate  325 mg Oral Q breakfast  . levothyroxine  150 mcg Oral QAC breakfast  . metoprolol succinate  25 mg Oral Daily  . Warfarin - Pharmacist Dosing Inpatient   Does not apply q1800   Continuous Infusions:  PRN Meds: ondansetron **OR** ondansetron (ZOFRAN) IV   Vital Signs    Vitals:   02/12/16 1003 02/12/16 1346 02/12/16 2100 02/13/16 0500  BP:  (!) 112/49 (!) 109/45 (!) 99/48  Pulse: 67 65 68 67  Resp: 13 14 15 14   Temp: 98 F (36.7 C) 97.7 F (36.5 C) 98.8 F (37.1 C) 98.4 F (36.9 C)  TempSrc: Oral Oral    SpO2: 100% 97% 99% 98%  Weight:    192 lb 9.6 oz (87.4 kg)  Height:        Intake/Output Summary (Last 24 hours) at 02/13/16 0826 Last data filed at 02/13/16 0600  Gross per 24 hour  Intake              120 ml  Output                0 ml  Net              120 ml   Filed Weights   02/11/16 0457 02/12/16 0500 02/13/16 0500  Weight: 188 lb 3.2 oz (85.4 kg) 191 lb 1.6 oz (86.7 kg) 192 lb 9.6 oz (87.4 kg)    Physical Exam   GEN: Well nourished, well developed, in no acute distress.  HEENT: Grossly normal.  Neck: Supple, no JVD,  carotid bruits, or masses. Cardiac: RRR, no murmurs, rubs, or gallops. + valve click. No clubbing, cyanosis, edema.  Radials/DP/PT 2+ and equal bilaterally.  Respiratory:  Respirations regular and unlabored, clear to auscultation bilaterally. GI: Soft, nontender, nondistended, BS + x 4. MS: no deformity or atrophy. Skin: warm and dry, no rash. Neuro:  Strength and sensation are intact. Psych: AAOx3.  Normal affect.  Labs    CBC  Recent Labs  02/12/16 0415 02/13/16 0532  WBC 4.2 3.6*  HGB 7.3* 7.4*  HCT 22.2* 22.7*  MCV 95.3 95.0  PLT 50* 48*   Basic Metabolic Panel  Recent Labs  02/11/16 0945 02/12/16 0415  NA 137 138  K 3.3* 3.7  CL 105 105  CO2 26 26  GLUCOSE 207* 143*  BUN 36* 31*  CREATININE 1.42* 1.42*  CALCIUM 8.0* 7.8*     Telemetry    NSR - Personally Reviewed  ECG     NSR- Personally Reviewed  Radiology    No  results found.   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 Admitted on 02/08/16 with presyncope and hypotension. INR was 8.1, hemoglobin 5.7.   Assessment & Plan    1. Heme positive stool: Admitted with supratherapeutic INR and anemia. Now with heme + stool. Will need GI work up. INR is 5.14 today. Hemoglobin stable from yesterday.   2. Persistent atrial fibrillation: s/p DCCV in Nov. 2016, NSR currently.   3. Chronic combined systolic and diastolic CHF: Does not appear volume overloaded on exam.     Signed, Little IshikawaErin E Ariany Kesselman, NP  02/13/2016, 8:26 AM

## 2016-02-13 NOTE — Consult Note (Signed)
Referring Provider:  Dr. Donato Schultz Primary Care Physician:  Londell Moh, MD Primary Gastroenterologist:  Dr. Doy Mince  Reason for Consultation:  GI bleeding with over-anticoagulation  HPI: Jamie Howell is a 65 y.o. female with rheumatic heart disease, status post mechanical mitral valve replacement, and status post aortic valve replacement x2, currently a bovine valve, on chronic Coumadin with no prior history of GI bleeding.   The chart indicates a history of cirrhosis, and indeed, she has a history of severe thrombocytopenia (platelet count in the 54,000 range), significant splenomegaly, and a nodular contour on ultrasound performed earlier this year.  For the past several weeks, she has had weakness, lightheadedness, and exertional dyspnea, as well as transient dark stools.   She was admitted to the hospital 5 days ago, at which time her INR was markedly elevated (pro time greater than 90, INR greater than 10). It had been in the therapeutic range at 2.4 just a week earlier. Hemoglobin was 7.2 on admission, and dropped overnight to 5.7, as opposed to 10.5 a month earlier. During this time, the patient's BUN was at baseline are somewhat above, 62 (currently 31), difficult to interpret in view of the patient's chronic liver disease and chronic renal insufficiency with an estimated GFR around 28-38.  In the hospital, the patient's INR came down to 2.0 but then, today, it's gone back to 5.3.  She has been fecal occult blood positive on this admission.  The patient has been on a daily 81 mg aspirin prior to admission. No prior history of ulcer disease. She had endoscopy many years ago at the time of her gallbladder workup, but has not had recent endoscopic evaluation.  No significant dyspeptic symptomatology, anorexia, involuntary weight loss, dysphagia, reflux, abdominal pain, or other upper tract symptomatology. No frank hematochezia. She has never had colonoscopic  evaluation.   Past Medical History:  Diagnosis Date  . Anemia   . Aortic valve stenosis    moderate aortic valve stenosis with moderate aortic insufficiency, stable MVR, trivial PR, mild TR, severe LAE, grade II-III diastolic dysfunction,mild pulmonary HTN , EF 50% by echo 12/2012  . Atypical atrial flutter (HCC)    s/p DCCV  . Chronic combined systolic (congestive) and diastolic (congestive) heart failure   . History of blood transfusion 09/2014   "during hospitalization"  . HTN (hypertension)   . Hypothyroidism   . 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 Obesity (BMI 30-39.9) 07/08/2014  . Paroxysmal atrial fibrillation (HCC)   . Paroxysmal atrial tachycardia (HCC)    s/p DCCV  . Pneumonia ~ 2014; 05/2014  . Portal hypertension with esophageal varices (HCC) 07/14/2014  . Pulmonary HTN    severe by echo 07/2015  . Rheumatic fever   . S/P mitral valve replacement with metallic valve 04/08/1991   31 mm St Jude bileaflet mechanical valve placed for rheumatic mitral stenosis  . 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 Splenomegaly 07/14/2014  . Subdural hematoma (HCC)    spontaneous  . Temporary low platelet count (HCC)   . Thyrotoxicosis     without mention of goiter or other cause, without mention of thyrotoxic crisis or storm    Past Surgical History:  Procedure Laterality Date  . APPLICATION OF WOUND VAC Left 10/20/2014   Procedure: APPLICATION OF WOUND VAC;  Surgeon: [name],  MD;  Location: [facility] OR;  Service: Thoracic;  Laterality: Left;  . BRAIN SURGERY    . CARDIAC CATHETERIZATION     06/2014  . CARDIAC VALVE REPLACEMENT    . CARDIOVERSION N/A 02/01/2015   Procedure: CARDIOVERSION;  Surgeon: Quintella Reichert, MD;  Location: [facility] ENDOSCOPY;  Service: Cardiovascular;  Laterality: N/A;  . LAPAROSCOPIC CHOLECYSTECTOMY  05-30-90   . LEFT AND RIGHT HEART CATHETERIZATION WITH CORONARY ANGIOGRAM N/A 06/28/2014   Procedure: LEFT AND RIGHT HEART CATHETERIZATION WITH CORONARY ANGIOGRAM;  Surgeon: Tonny Bollman, MD; Normal coronaries, MVR OK, severe AS, elevated R heart pressures    . MITRAL VALVE REPLACEMENT  04/08/1991   31mm St Jude mechanical valve - Dr Andrey Campanile  . MULTIPLE EXTRACTIONS WITH ALVEOLOPLASTY N/A 08/05/2014   Procedure: Extraction of tooth #'s 3,4,5,7,8,9,10,11,12,13,17,20,21,22,23,24,25,26,27, 28,29, and 32 with alveoloplasty;  Surgeon: Charlynne Pander, DDS;  Location: [facility] OR;  Service: Oral Surgery;  Laterality: N/A;  . SUBDURAL HEMATOMA EVACUATION VIA CRANIOTOMY  2000   Dr Dutch Quint  . TEE WITHOUT CARDIOVERSION N/A 09/28/2014   Procedure: TRANSESOPHAGEAL ECHOCARDIOGRAM (TEE);  Surgeon: Tonny Bollman, MD;  Location: [facility] OR;  Service: Open Heart Surgery;  Laterality: N/A;  . TONSILLECTOMY    . TOTAL ABDOMINAL HYSTERECTOMY  07-23-1994  . TRANSCATHETER AORTIC VALVE REPLACEMENT, TRANSFEMORAL N/A 09/28/2014   Procedure: TRANSCATHETER AORTIC VALVE REPLACEMENT, TRANSFEMORAL;  Surgeon: Tonny Bollman, MD;  Location[facility];  Service: Open Heart Surgery;  Laterality: N/A;  . WOUND EXPLORATION Left 10/20/2014   Procedure: WOUND EXPLORATION;  Surgeon: [name], MD;  Location: [facility] OR;  Service: Thoracic;  Laterality: Left;    Prior to Admission medications   Medication Sig Start Date End Date Taking? Authorizing Provider  acetaminophen (TYLENOL) 325 MG tablet Take 325-650 mg by mouth every 6 (six) hours as needed for headache.   Yes Historical Provider, MD  amiodarone (PACERONE) 200 MG tablet Take 1 tablet (200 mg total) by mouth daily. 11/25/15  Yes Quintella Reichert, MD  aspirin EC 81 MG tablet Take 1 tablet (81 mg total) by mouth daily. 12/10/13  Yes Quintella Reichert, MD  Calcium Carb-Cholecalciferol (CALCIUM 1000 + D PO) Take 1,000 mg by mouth daily with lunch.    Yes Historical Provider, MD  ferrous sulfate 325 (65 FE) MG  tablet TAKE ONE TABLET BY MOUTH ONCE DAILY WITH BREAKFAST Patient taking differently: Take 325 mg by mouth once a day with breakfast 04/01/15  Yes Quintella Reichert, MD  levothyroxine (SYNTHROID, LEVOTHROID) 150 MCG tablet Take 150 mcg by mouth daily before breakfast.  10/18/14  Yes Historical Provider, MD  metoprolol succinate (TOPROL-XL) 50 MG 24 hr tablet Take 25 mg by mouth every morning.    Yes Historical Provider, MD  potassium chloride SA (K-DUR,KLOR-CON) 20 MEQ tablet Take 20 mEq by mouth 2 (two) times daily. MORNING AND LUNCHTIME   Yes Historical Provider, MD  torsemide (DEMADEX) 20 MG tablet Take 20-40 mg by mouth See admin instructions. 20 mg on Sun/Tues/Thurs/Sat and 40 mg on Mon/Wed/Fri   Yes Historical Provider, MD  warfarin (COUMADIN) 5 MG tablet Take 0.5 tablets (2.5 mg total) by mouth at bedtime. Or as directed. Patient taking differently: Take 2.5-5 mg by mouth See admin instructions. 5 mg in the evening on Sun/Tues/Thurs/Sat and 2.5 mg on Mon/Wed/Fri 10/25/14  Yes Donielle Margaretann Loveless, PA-C    Current Facility-Administered Medications  Medication Dose Route Frequency Provider Last Rate Last Dose  . 0.9 %  sodium chloride infusion   Intravenous  Once Laurey Moralealton S McLean, MD      . amiodarone (PACERONE) tablet 200 mg  200 mg Oral Daily Beatrice LecherScott T Weaver, PA-C   200 mg at 02/13/16 0919  . calcium carbonate (OS-CAL - dosed in mg of elemental calcium) tablet   Oral Q lunch Beatrice LecherScott T Weaver, PA-C   1,250 mg at 02/13/16 1216  . cyclobenzaprine (FLEXERIL) tablet 5 mg  5 mg Oral QHS Beatrice LecherScott T Weaver, PA-C   5 mg at 02/12/16 2207  . ferrous sulfate tablet 325 mg  325 mg Oral Q breakfast Beatrice LecherScott T Weaver, PA-C   325 mg at 02/13/16 [id number]  . levothyroxine (SYNTHROID, LEVOTHROID) tablet 150 mcg  150 mcg Oral QAC breakfast Beatrice LecherScott T Weaver, PA-C   150 mcg at 02/13/16 [id number]  . metoprolol succinate (TOPROL-XL) 24 hr tablet 25 mg  25 mg Oral Daily Bertram MillardMichael A Maccia, RPH   25 mg at 02/13/16 0919  . ondansetron (ZOFRAN)  tablet 4 mg  4 mg Oral Q6H PRN Beatrice LecherScott T Weaver, PA-C       Or  . ondansetron (ZOFRAN) injection 4 mg  4 mg Intravenous Q6H PRN Beatrice LecherScott T Weaver, PA-C   4 mg at 02/10/16 0154  . Warfarin - Pharmacist Dosing Inpatient   Does not apply q1800 Renaee MundaKendra P Hiatt, RPH        Allergies as of 02/08/2016 - Review Complete 02/08/2016  Allergen Reaction Noted  . Adhesive [tape] Itching 08/05/2014  . Spironolactone Other (See Comments) 01/25/2016  . Penicillins Rash 01/18/2013  . Sulfa antibiotics Rash 01/18/2013    Family History  Problem Relation Age of Onset  . CAD Father   . Hypertension Father   . CAD Mother   . Cirrhosis Brother   . Heart attack Sister     Social History   Social History  . Marital status: Single    Spouse name: N/A  . Number of children: 2  . Years of education: N/A   Occupational History  . circulation desk clerk    Social History Main Topics  . Smoking status: Never Smoker  . Smokeless tobacco: Never Used  . Alcohol use No  . Drug use: No  . Sexual activity: Not Currently   Other Topics Concern  . Not on file   Social History Narrative   [place].  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    Review of Systems: The patient had some transient lightheadedness in the weeks prior to admission, and exertional dyspnea to which she is on accustomed. No chest pain. No frank syncope. No abdominal pain. No joint effusions, but does have mild lower extremity edema treated with diuretics. No urinary symptoms. No skin rashes, other than easy bruising.  Physical Exam: Vital signs in last 24 hours: Temp:  [97.8 F (36.6 C)-98.8 F (37.1 C)] 97.8 F (36.6 C) (11/13 1417) Pulse Rate:  [67-72] 72 (11/13 1417) Resp:  [14-15] 14 (11/13 0500) BP: (99-125)/(45-59) 114/55 (11/13 1417) SpO2:  [98 %-100 %] 100 % (11/13 1417) Weight:  [87.4 kg (192 lb 9.6 oz)] 87.4 kg (192 lb 9.6 oz) (11/13 0500) Last BM Date: 02/12/16 General:   Alert,   significantly overweight, pleasant and cooperative in NAD Head:  Normocephalic and atraumatic. Eyes:  Sclera clear, no icterus.   Conjunctiva pale. Mouth:   No ulcerations or lesions.  Oropharynx pink & moist. Neck:   No masses or thyromegaly. Lungs:  Clear throughout to auscultation.   No wheezes, crackles,  or rhonchi. No evident respiratory distress. Heart:   Regular rate and rhythm; no gallops. Crisp prosthetic heart valve sounds with probable flow murmur. Abdomen:  Obese, slightly distended, moderately tympanitic, no palpable hepatosplenomegaly, no guarding, mass effect, or tenderness Msk:   Symmetrical without gross deformities. Pulses:  Normal radial pulse is noted. Extremities:   Without clubbing, cyanosis; mild lower extremity edema with chronic skin stasis changes Neurologic:  Alert and coherent;  grossly normal neurologically. Skin:  Intact without significant lesions or rashes except for purpura on left forearm, probably traumatic. Cervical Nodes:  No significant cervical adenopathy. Psych:   Alert and cooperative. Normal mood and affect.  Intake/Output from previous day: 11/12 0701 - 11/13 0700 In: 120 [P.O.:120] Out: -  Intake/Output this shift: No intake/output data recorded.  Lab Results:  Recent Labs  02/11/16 0344 02/12/16 0415 02/13/16 0532  WBC 4.2 4.2 3.6*  HGB 7.3* 7.3* 7.4*  HCT 22.2* 22.2* 22.7*  PLT 45* 50* 48*   BMET  Recent Labs  02/11/16 0945 02/12/16 0415  NA 137 138  K 3.3* 3.7  CL 105 105  CO2 26 26  GLUCOSE 207* 143*  BUN 36* 31*  CREATININE 1.42* 1.42*  CALCIUM 8.0* 7.8*   LFT No results for input(s): PROT, ALBUMIN, AST, ALT, ALKPHOS, BILITOT, BILIDIR, IBILI in the last 72 hours. PT/INR  Recent Labs  02/13/16 0532 02/13/16 1027  LABPROT 48.9* 50.1*  INR 5.14* 5.30*    Studies/Results: No results found.  Impression: 1. Acute posthemorrhagic anemia 2. Supratherapeutic anticoagulation 3. 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 Thrombocytopenia, as noted above, moderately severe 5. Chronic renal disease 6. Status post mitral and aortic valve replacements, on chronic anticoagulation 7. Obesity 8. Aspirin exposure;  9. no prior colonoscopic screening 10. Heart failure with diminished ejection fraction in the 40-45% range  Plan: 1. Given the severe, extreme level of supratherapeutic anticoagulation, it is very possible that this patient does not have a discrete "point source" for her recent GI tract blood loss. Nonetheless, possibilities the come to mind include aspirin-induced gastropathy, portal gastropathy, low-grade variceal bleeding, vascular ectasia, or colonic neoplasia. 2. Nonetheless, in view of her aspirin therapy, ulcer disease is certainly a possibility and I feel endoscopic evaluation is prudent once the patient's INR comes down to the therapeutic, or normal, range. Ashby Dawesature, purpose, and risks of the procedure reviewed with patient and son, Jamie Howell, at the bedside, and they are agreeable. 3. When stable, as an outpatient, with improvement in her hemoglobin, the patient should be considered for lower tract colon cancer screening and pathology evaluation. This could either be colonoscopy, a barium enema, or serial Hemoccults. I will leave this to the discretion of her primary gastroenterologist, Dr. Charlott RakesVincent Schooler. 4. Given this patient's chronic thrombocytopenia, and demonstrated GI tract bleeding, I would reconsider whether the risks of ongoing antiplatelet therapy (aspirin) outweighed the benefits. 5. Given this patient's hepatic and renal dysfunction, would consider a pharmacy consult to see if an alternative anticoagulation regimen might be feasible, and perhaps less risky, taking into account the observed marked  fluctuations in the patient's INR. 6. I will make the patient NPO after midnight tonight for possible endoscopic evaluation tomorrow.   LOS: 4 days   Iliyah Bui V  02/13/2016, 3:44 PM   Pager [phone] If no answer or after 5 PM  call [phone]

## 2016-02-14 ENCOUNTER — Inpatient Hospital Stay (HOSPITAL_COMMUNITY): Payer: PPO

## 2016-02-14 LAB — HEPATIC FUNCTION PANEL
ALT: 29 U/L (ref 14–54)
AST: 49 U/L — AB (ref 15–41)
Albumin: 2.4 g/dL — ABNORMAL LOW (ref 3.5–5.0)
Alkaline Phosphatase: 115 U/L (ref 38–126)
Bilirubin, Direct: 0.9 mg/dL — ABNORMAL HIGH (ref 0.1–0.5)
Indirect Bilirubin: 1.8 mg/dL — ABNORMAL HIGH (ref 0.3–0.9)
TOTAL PROTEIN: 5.4 g/dL — AB (ref 6.5–8.1)
Total Bilirubin: 2.7 mg/dL — ABNORMAL HIGH (ref 0.3–1.2)

## 2016-02-14 LAB — CBC
HEMATOCRIT: 22.8 % — AB (ref 36.0–46.0)
HEMOGLOBIN: 7.4 g/dL — AB (ref 12.0–15.0)
MCH: 31 pg (ref 26.0–34.0)
MCHC: 32.5 g/dL (ref 30.0–36.0)
MCV: 95.4 fL (ref 78.0–100.0)
Platelets: 51 10*3/uL — ABNORMAL LOW (ref 150–400)
RBC: 2.39 MIL/uL — AB (ref 3.87–5.11)
RDW: 18.8 % — ABNORMAL HIGH (ref 11.5–15.5)
WBC: 3.8 10*3/uL — ABNORMAL LOW (ref 4.0–10.5)

## 2016-02-14 LAB — PROTIME-INR
INR: 6.66
PROTHROMBIN TIME: 60.2 s — AB (ref 11.4–15.2)

## 2016-02-14 LAB — LACTATE DEHYDROGENASE: LDH: 279 U/L — AB (ref 98–192)

## 2016-02-14 MED ORDER — METOPROLOL SUCCINATE ER 25 MG PO TB24
12.5000 mg | ORAL_TABLET | Freq: Every day | ORAL | Status: DC
  Administered 2016-02-15 – 2016-02-17 (×3): 12.5 mg via ORAL
  Filled 2016-02-14 (×3): qty 1

## 2016-02-14 NOTE — Progress Notes (Signed)
Hemoglobin and platelets stable from yesterday.  Paradoxically, her INR has actually gone up, rather than down, over the past 24 hours.  Recommendations:  1. No endoscopy today, in view of supratherapeutic INR 2. Will obtain hepatic elastography today as noninvasive evaluation of degree of hepatic fibrosis 3. Will make patient nothing by mouth again tonight in anticipation of possible endoscopy tomorrow if her INR comes down significantly 4. Would recommend consideration of whether this patient's aspirin can be stopped in view of her thrombocytopenia, and would also recommend pharmacy consultation to consider alternative anticoagulant therapy in view of her liver disease, renal dysfunction, and difficulty maintaining a stable INR on warfarin. 5. Please feel free to call me to discuss  Jamie Reasonsobert V. Rakeem Colley, M.D. Pager [phone] If no answer or after 5 PM call [phone]

## 2016-02-14 NOTE — Progress Notes (Addendum)
ANTICOAGULATION CONSULT NOTE - Follow Up Consult  Pharmacy Consult for Coumadin Indication: mechanical valves  Allergies  Allergen Reactions  . Adhesive [Tape] Itching    Adhesive tape and backing (pls use elastic bandages with no adhesive)  . Spironolactone Other (See Comments)    Severe dizziness and nausea  . Penicillins Rash    Has patient had a PCN reaction causing immediate rash, facial/tongue/throat swelling, SOB or lightheadedness with hypotension: Yes Has patient had a PCN reaction causing severe rash involving mucus membranes or skin necrosis: No Has patient had a PCN reaction that required hospitalization No Has patient had a PCN reaction occurring within the last 10 years: No If all of the above answers are "NO", then may proceed with Cephalosporin use.   . Sulfa Antibiotics Rash    Patient Measurements: Height: 4\' 11"  (149.9 cm) Weight: 192 lb 11.2 oz (87.4 kg) IBW/kg (Calculated) : 43.2  Heparin Dosing Weight: 65kg (based on IBW of 45.5 kgs)  Vital Signs: Temp: 97.8 F (36.6 C) (11/14 1429) Temp Source: Oral (11/14 1429) BP: 102/66 (11/14 1429) Pulse Rate: 58 (11/14 1429)  Labs:  Recent Labs  02/11/16 1834  02/12/16 0415 02/13/16 0532 02/13/16 1027 02/14/16 0353  HGB  --   < > 7.3* 7.4*  --  7.4*  HCT  --   --  22.2* 22.7*  --  22.8*  PLT  --   --  50* 48*  --  51*  LABPROT  --   < > 35.2* 48.9* 50.1* 60.2*  INR  --   < > 3.40 5.14* 5.30* 6.66*  HEPARINUNFRC 0.38  --  0.39 <0.10*  --   --   CREATININE  --   --  1.42*  --   --   --   < > = values in this interval not displayed.  Estimated Creatinine Clearance: 38 mL/min (by C-G formula based on SCr of 1.42 mg/dL (H)).  Assessment: 65 y.o. female with mechanical MVR and TAVR for anticoagulation.  On coumadin PTA, INR on admit >10.0 on 02/18/16 S/p Vit K had been given since 11/8 and INR decreased to 2.21 on 11/9 pm.  Coumadin resumed 11/10 and Heparin drip started on 11/10 then heparin DC'd 11/12  when INR up to 3.4. (Goal INR 2.5-3.5) Yesterday noted to have heme + stool and INR 5.14 Coumadin held yesterday 02/13/16 -Today INR increased to 6.66  -Hgb 7.4 and stable, s/p PRBC x 2  11/9 AM -Pltc 48>51k- stable, chronically low (48 10/2014) secondary liver dz. GI consulted. possible endoscopy tomorrow if her INR comes down significantly  GI noted to consider alternative anticoagulant therapy in view of her liver disease, renal dysfunction, and difficulty maintaining a stable INR on warfarin.  However the NOAC's (Xarelto, Eliquis) are not approved to use for mechanical MVR and TAVR for anticoagulation and are not recommended to use in patient with hepatic impairment.    Goal of Therapy:  INR 2.5-3.5 Monitor platelets by anticoagulation protocol: Yes   Plan:  No Coumadin tonight -Daily INR, CBC -Monitor for S/Sx of bleeding   Thank you for allowing pharmacy to be part of this patients care team. Noah Delaineuth Treshaun Carrico, RPh Clinical Pharmacist Pager: [phone] 02/14/2016 2:36 PM

## 2016-02-14 NOTE — Progress Notes (Signed)
Patient Name: Jamie Howell Date of Encounter: 02/14/2016  Primary Cardiologist: Dr. 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   Inpatient Medications    Scheduled Meds: . sodium chloride   Intravenous Once  . amiodarone  200 mg Oral Daily  . calcium carbonate   Oral Q lunch  . cyclobenzaprine  5 mg Oral QHS  . ferrous sulfate  325 mg Oral Q breakfast  . levothyroxine  150 mcg Oral QAC breakfast  . metoprolol succinate  25 mg Oral Daily  . Warfarin - Pharmacist Dosing Inpatient   Does not apply q1800   Continuous Infusions:  PRN Meds: ondansetron **OR** ondansetron (ZOFRAN) IV   Vital Signs    Vitals:   02/13/16 1417 02/13/16 2100 02/14/16 0636 02/14/16 0716  BP: (!) 114/55 104/89 (!) 97/51 (!) 100/57  Pulse: 72 (!) 58 (!) 56   Resp:  16 15 (!) 21  Temp: 97.8 F (36.6 C) 97.9 F (36.6 C) 97.9 F (36.6 C)   TempSrc: Oral     SpO2: 100% 100% 100%   Weight:   192 lb 11.2 oz (87.4 kg)   Height:        Intake/Output Summary (Last 24 hours) at 02/14/16 0801 Last data filed at 02/14/16 0636  Gross per 24 hour  Intake              120 ml  Output                0 ml  Net              120 ml   Filed Weights   02/12/16 0500 02/13/16 0500 02/14/16 0636  Weight: 191 lb 1.6 oz (86.7 kg) 192 lb 9.6 oz (87.4 kg) 192 lb 11.2 oz (87.4 kg)    Physical Exam   GEN: Well nourished, well developed, in no acute distress.  HEENT: Grossly normal.  Neck: Supple, no JVD, carotid bruits, or masses. Cardiac: RRR, no  murmurs, rubs, or gallops. + mechanical valve click. No clubbing, cyanosis, edema.  Radials/DP/PT 2+ and equal bilaterally.  Respiratory:  Respirations regular and unlabored, clear to auscultation bilaterally. GI: Soft, nontender, nondistended, BS + x 4. MS: no deformity or atrophy. Skin: warm and dry, no rash. Diffuse ecchymosis on bilateral feet.  Neuro:  Strength and sensation are intact. Psych: AAOx3.  Normal affect.  Labs    CBC  Recent Labs  02/13/16 0532 02/14/16 0353  WBC 3.6* 3.8*  HGB 7.4* 7.4*  HCT 22.7* 22.8*  MCV 95.0 95.4  PLT 48* 51*   Basic Metabolic Panel  Recent Labs  02/11/16 0945 02/12/16 0415  NA 137 138  K 3.3* 3.7  CL 105 105  CO2 26 26  GLUCOSE 207* 143*  BUN 36* 31*  CREATININE 1.42* 1.42*  CALCIUM 8.0* 7.8*     Telemetry    Sinus brady- Personally Reviewed  ECG     NSR- Personally Reviewed  Radiology    No  results found.   Patient Profile  Jamie Howell is a 65 y.o. female with rheumatic heart disease, status post mechanical mitral valve replacement, and status post aortic valve replacement x2, currently a bovine valve, on chronic Coumadin with no prior history of GI bleeding.  Presented to the ED on 02/08/16 with weakness and presyncope, INR was >10, Hgb was 7.2 on admission then dropped to 5.7 overnight.   Assessment & Plan    1. Supratherapeutic INR/posthemorrhagic anemia: Appreciate GI consultation, plan for endoscopy today, however INR is elevated at 6.6. GI to see this am and decide on endoscopy.   2. History of persistent atrial fibrillation: Sinus brady this am.   3. Mitral valve replacement: On chronic coumadin, which is being held but INR continues to rise.   4. Chronic combined systolic and diastolic CHF: Stable.    Signed, Little IshikawaErin E Smith, NP  02/14/2016, 8:01 AM

## 2016-02-14 NOTE — Care Management Note (Addendum)
Case Management Note  Patient Details  Name: Jamie Howell MRN: [id number] Date of Birth: 1951/02/25  Subjective/Objective:  Pt presented for Presyncope and hypotension. Pt is from home with son. Son works during the day. Plan will be to return home with [facility]. Pt may benefit from [place]. Pt has [employer]. Pt may have a co pay for services.                   Action/Plan: Pt has used [facility] in the past and wanted to use again. CM did make referral and Liaison to check to see if co pay will be needed. Pt need DME Order for [employer]. Pt also wants to use [facility] for DME. Once ordered CM will make referral. No further needs from CM at this time.   Expected Discharge Date:                  Expected Discharge Plan:  [place]  In-House Referral:  NA  Discharge planning Services  CM Consult  Post Acute Care Choice:  Durable Medical Equipment, Home Health Choice offered to:  Patient  DME Arranged:  Shower stool, Walker rolling with seat DME Agency:  [place] Arranged:  PT, [place] Agency:  [place]  Status of Service:  In process, will continue to follow  If discussed at Long Length of Stay Meetings, dates discussed:  02-14-16, 02-16-16  Additional Comments: 1613 02-16-16 Jacqlyn Krauss, RN,BSN [phone] CM discussed patient in the [place]. Pt is still supratherapeutic INR @ 4.51. Coumadin on hold. Awaiting INR to decline for possible endoscopy. Possible Palliative Consult. MD advisor aware of plan of care at this time.    0843 02-15-16 Jacqlyn Krauss, RN,BSN [phone] CM did speak with liaison for [facility] and pt will have a co pay for services until deductible is met >3000.00. CM did speak with patient and she is agreeable to pay the co pay for PT Services. Pt needs order for DME 3n1 as well.  Bethena Roys, RN 02/14/2016, 1:24 PM

## 2016-02-14 NOTE — Progress Notes (Signed)
Critical INR of 6.6. Paged on call cards fellow Dr. Virgina OrganQureshi about lab value. Received no new orders. Will continue to monitor.  Tera Helperooper, Ellionna Buckbee E

## 2016-02-15 LAB — RETICULOCYTES
RBC.: 2.39 MIL/uL — AB (ref 3.87–5.11)
RETIC COUNT ABSOLUTE: 198.4 10*3/uL — AB (ref 19.0–186.0)
Retic Ct Pct: 8.3 % — ABNORMAL HIGH (ref 0.4–3.1)

## 2016-02-15 LAB — BASIC METABOLIC PANEL
ANION GAP: 7 (ref 5–15)
BUN: 21 mg/dL — ABNORMAL HIGH (ref 6–20)
CALCIUM: 8.1 mg/dL — AB (ref 8.9–10.3)
CO2: 25 mmol/L (ref 22–32)
CREATININE: 1.43 mg/dL — AB (ref 0.44–1.00)
Chloride: 103 mmol/L (ref 101–111)
GFR, EST AFRICAN AMERICAN: 43 mL/min — AB (ref >60)
GFR, EST NON AFRICAN AMERICAN: 38 mL/min — AB (ref >60)
GLUCOSE: 128 mg/dL — AB (ref 65–99)
Potassium: 4.3 mmol/L (ref 3.5–5.1)
Sodium: 135 mmol/L (ref 135–145)

## 2016-02-15 LAB — CBC
HCT: 23 % — ABNORMAL LOW (ref 36.0–46.0)
HEMOGLOBIN: 7.5 g/dL — AB (ref 12.0–15.0)
MCH: 31 pg (ref 26.0–34.0)
MCHC: 32.6 g/dL (ref 30.0–36.0)
MCV: 95 fL (ref 78.0–100.0)
PLATELETS: 52 10*3/uL — AB (ref 150–400)
RBC: 2.42 MIL/uL — AB (ref 3.87–5.11)
RDW: 19 % — ABNORMAL HIGH (ref 11.5–15.5)
WBC: 5 10*3/uL (ref 4.0–10.5)

## 2016-02-15 LAB — PROTIME-INR
INR: 4.81 — AB
PROTHROMBIN TIME: 46.4 s — AB (ref 11.4–15.2)

## 2016-02-15 MED ORDER — TORSEMIDE 20 MG PO TABS
20.0000 mg | ORAL_TABLET | Freq: Once | ORAL | Status: AC
  Administered 2016-02-16: 20 mg via ORAL
  Filled 2016-02-15: qty 1

## 2016-02-15 MED ORDER — TORSEMIDE 20 MG PO TABS: 20.0000 mg | ORAL_TABLET | Freq: Every day | ORAL | Status: DC

## 2016-02-15 MED ORDER — TORSEMIDE 20 MG PO TABS
20.0000 mg | ORAL_TABLET | Freq: Every day | ORAL | Status: DC
  Administered 2016-02-16 – 2016-02-18 (×3): 20 mg via ORAL
  Filled 2016-02-15 (×3): qty 1

## 2016-02-15 NOTE — Progress Notes (Signed)
ANTICOAGULATION CONSULT NOTE - Follow Up Consult  Pharmacy Consult for Coumadin Indication: mechanical valves  Allergies  Allergen Reactions  . Adhesive [Tape] Itching    Adhesive tape and backing (pls use elastic bandages with no adhesive)  . Spironolactone Other (See Comments)    Severe dizziness and nausea  . Penicillins Rash    Has patient had a PCN reaction causing immediate rash, facial/tongue/throat swelling, SOB or lightheadedness with hypotension: Yes Has patient had a PCN reaction causing severe rash involving mucus membranes or skin necrosis: No Has patient had a PCN reaction that required hospitalization No Has patient had a PCN reaction occurring within the last 10 years: No If all of the above answers are "NO", then may proceed with Cephalosporin use.   . Sulfa Antibiotics Rash    Patient Measurements: Height: 4\' 11"  (149.9 cm) Weight: 194 lb 11.2 oz (88.3 kg) IBW/kg (Calculated) : 43.2  Heparin Dosing Weight: 65kg (based on IBW of 45.5 kgs)  Vital Signs: Temp: 97.6 F (36.4 C) (11/15 0624) Temp Source: Oral (11/15 0624) BP: 107/59 (11/15 0624) Pulse Rate: 69 (11/15 0624)  Labs:  Recent Labs  02/13/16 0532  02/14/16 0353 02/15/16 0413 02/15/16 0656 02/15/16 0921  HGB 7.4*  --  7.4* 7.5*  --   --   HCT 22.7*  --  22.8* 23.0*  --   --   PLT 48*  --  51* 52*  --   --   LABPROT 48.9*  < > 60.2* >90.0* 46.4*  --   INR 5.14*  < > 6.66* >10.00* 4.81*  --   HEPARINUNFRC <0.10*  --   --   --   --   --   CREATININE  --   --   --   --   --  1.43*  < > = values in this interval not displayed.  Estimated Creatinine Clearance: 37.9 mL/min (by C-G formula based on SCr of 1.43 mg/dL (H)).  Assessment: 65 y.o. female with mechanical MVR and TAVR for anticoagulation.  On coumadin PTA, INR on admit >10.0 on 02/18/16 S/p Vit K had been given since 11/8 and INR decreased to 2.21 on 11/9 pm.  Coumadin resumed 11/10 and Heparin drip started on 11/10 then heparin DC'd  11/12 when INR up to 3.4. (Goal INR 2.5-3.5) On 11/3 noted to have heme + stool and INR supratherapeutic -Today INR remains subtherapeutic but has decreased to 4.81 -Hgb 7.5 and stable, s/p PRBC x 2  11/9 AM -Pltc 48>51>52k- stable, chronically low (48 10/2014) secondary liver dz. GI consulted- plan for endoscopy when  INR comes down significantly  GI noted to consider alternative anticoagulant therapy in view of her liver disease, renal dysfunction, and difficulty maintaining a stable INR on warfarin.  However the NOAC's (Xarelto, Eliquis) are not approved to use for mechanical MVR and TAVR for anticoagulation and are not recommended to use in patient with hepatic impairment.    Goal of Therapy:  INR 2.5-3.5 Monitor platelets by anticoagulation protocol: Yes   Plan:  No Coumadin tonight. Awaiting INR to decline for possible endoscopy.  F/u to restart IV hepairn if INR <2.5 Daily INR, CBC Monitor for S/Sx of bleeding   Thank you for allowing pharmacy to be part of this patients care team. Noah Delaineuth Miro Balderson, RPh Clinical Pharmacist Pager: [phone] 02/15/2016 4:18 PM

## 2016-02-15 NOTE — Progress Notes (Signed)
Patient Name: Jamie Howell Date of Encounter: 02/15/2016  Primary Cardiologist: Dr. 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   Inpatient Medications    Scheduled Meds: . sodium chloride   Intravenous Once  . amiodarone  200 mg Oral Daily  . calcium carbonate   Oral Q lunch  . cyclobenzaprine  5 mg Oral QHS  . ferrous sulfate  325 mg Oral Q breakfast  . levothyroxine  150 mcg Oral QAC breakfast  . metoprolol succinate  12.5 mg Oral Daily  . Warfarin - Pharmacist Dosing Inpatient   Does not apply q1800   Continuous Infusions:  PRN Meds: ondansetron **OR** ondansetron (ZOFRAN) IV   Vital Signs    Vitals:   02/14/16 2130 02/15/16 0624 02/15/16 1300 02/15/16 2100  BP: (!) 113/51 (!) 107/59 115/69 102/84  Pulse: 78 69 67 70  Resp: (!) 23 (!) 23 (!) 21 18  Temp: 97.5 F (36.4 C) 97.6 F (36.4 C) 98 F (36.7 C) 97.6 F (36.4 C)  TempSrc: Oral Oral Oral Oral  SpO2: 98% 100% 100% 100%  Weight:  194 lb 11.2 oz (88.3 kg)    Height:        Intake/Output Summary (Last 24 hours) at 02/15/16 2340 Last data filed at 02/15/16 1854  Gross per 24 hour  Intake              720 ml  Output                0 ml  Net              720 ml   Filed Weights   02/13/16 0500 02/14/16 0636 02/15/16 0624  Weight: 192 lb 9.6 oz (87.4 kg) 192 lb 11.2 oz (87.4 kg) 194 lb 11.2 oz (88.3 kg)    Physical Exam   GEN: Well nourished, well developed, in no acute distress.  HEENT: Grossly normal.  Neck: Supple, no JVD, carotid  bruits, or masses. Cardiac: RRR, no murmurs, rubs, or gallops. + mechanical valve click. No clubbing, cyanosis, edema.  Radials/DP/PT 2+ and equal bilaterally.  Respiratory:  Respirations regular and unlabored, clear to auscultation bilaterally. GI: Soft, nontender, nondistended, BS + x 4. MS: no deformity or atrophy. Skin: warm and dry, no rash. Diffuse ecchymosis on bilateral feet.  Neuro:  Strength and sensation are intact. Psych: AAOx3.  Normal affect.  Labs    CBC  Recent Labs  02/14/16 0353 02/15/16 0413  WBC 3.8* 5.0  HGB 7.4* 7.5*  HCT 22.8* 23.0*  MCV 95.4 95.0  PLT 51* 52*   Basic Metabolic Panel  Recent Labs  02/15/16 0921  NA 135  K 4.3  CL 103  CO2 25  GLUCOSE 128*  BUN 21*  CREATININE 1.43*  CALCIUM 8.1*     Telemetry    Sinus brady- Personally Reviewed  ECG     NSR- Personally Reviewed  Radiology    [place] Abdomen Limited  Result Date: 02/14/2016 CLINICAL DATA:  Chronic liver disease, for elastography evaluation EXAM: LIMITED ABDOMEN ULTRASOUND FOR ASCITES TECHNIQUE: Limited ultrasound survey for ascites was performed in all four abdominal quadrants. COMPARISON:  Ultrasound abdomen 05/24/2015 FINDINGS: FINDINGS Significant ascites is present including perihepatic. This prevents performance of an adequate elastography exam due to the depth of liver from the skin surface as well as mobility liver within the ascites. Hepatic margin appears nodular suggesting cirrhosis. IMPRESSION: Significant ascites, preventing performance of an adequate elastography exam. May reconsider performing elastography following paracentesis, or at a delayed date following discharge if ascites results. Electronically Signed   By: Ulyses SouthwardMark  Boles M.D.   On: 02/14/2016 16:35     Patient Profile  Jamie Howell is a 65 y.o. female with rheumatic heart disease, status post mechanical mitral valve replacement, and status post aortic valve replacement x2, currently a bovine valve, on  chronic Coumadin with no prior history of GI bleeding.  Presented to the ED on 02/08/16 with weakness and presyncope, INR was >10, Hgb was 7.2 on admission then dropped to 5.7 overnight.   Assessment & Plan    1. Supratherapeutic INR/posthemorrhagic anemia: Appreciate GI consultation, plan for endoscopy tomorrow if INR < 3.5.  Coumadin on hold.  2. History of persistent atrial fibrillation: Sinus brady this am.   3. Mitral valve replacement: On chronic coumadin, which is being held but INR continues to rise.   4. Chronic combined systolic and diastolic CHF: She has significant ascites on US secondary to cardiac cirrhosis.  She is very tenuous with fluid status.  She did not tolerate aldactone due to nausea.  Metolazone was stopped due to orthostasis and worsening renal function.  I believe she has end stage right heart failure and we are running out of options.  She is volume overloaded with ascites and RHF but increase diuresis leads to hypotension and dizziness.  She has had decreased appetite and nausea most likely due to ascites and cirrhosis. Will discuss with Dr. Gala RomneyBensimhon.  We may be nearing need for discussion on Palliative Care.  Will see if [facility] wants to consider [facility]HC to see if she may be candidate for IV Milrinone Rx.  Ultimately her options are very limited at this time.  Will continue to diurese as BP allows.  5. Severe anemia ? Blood loss although iron studies are normal.  She does have heme positive stools.  Another consideration would be hemolysis from MVR.  Her indirect bili and LDH are elevated.  Will check a haptoglobin and retic count. GI following and plan for endocscopy tomorrow.  If neg will need colonoscopy as she has never had one if she does not opt for Palliative Care.   Signed, Armanda Magicraci Fleeta Kunde, MD  02/15/2016, 11:40 PM

## 2016-02-15 NOTE — Progress Notes (Signed)
PT Cancellation Note  Patient Details Name: Jamie Howell C Brougham MRN: [id number] DOB: 09/24/1950   Cancelled Treatment:    Reason Eval/Treat Not Completed: Medical issues which prohibited therapy Holding PT tx today secondary to INR >10 as pt high risk of bleeding. Will follow as appropriate.   Blake DivineShauna A Sandrine Bloodsworth 02/15/2016, 9:00 AM Mylo RedShauna Otis Burress, PT, DPT [phone]

## 2016-02-15 NOTE — Progress Notes (Signed)
GASTROENTEROLOGY PROGRESS NOTE    Subjective: No acute complaints  Objective: Hemoglobin stable at 7.5, platelets stable at 52,000, INR improved at 4.8, but still too high for elective endoscopy.  Hepatic elastography could not be done yesterday because of presence of ascites (apparently, a new problem for this patient).  Assessment: 1. Subacute GI bleeding characterized by 5 g drop in hemoglobin over the past month  2. Chronic anticoagulation with Coumadin and aspirin 3. Chronic liver disease with thrombocytopenia and ascites  Plan: 1. Endoscopy tomorrow if INR 3 or under (could possibly do even if INR is 3.5) 2. Diuresis per cardiology (spoke with Suzzette RighterErin Smith today and asked them to handle that aspect of the patient's care, given her cardiac complexity and the fact that she is allergic to spironolactone)  Jamie Reasonsobert V. Alethea Terhaar, M.D. 02/15/2016 8:37 PM  Pager [phone] If no answer or after 5 PM call [phone]

## 2016-02-16 LAB — CBC
HCT: 25.1 % — ABNORMAL LOW (ref 36.0–46.0)
Hemoglobin: 8.1 g/dL — ABNORMAL LOW (ref 12.0–15.0)
MCH: 31 pg (ref 26.0–34.0)
MCHC: 32.3 g/dL (ref 30.0–36.0)
MCV: 96.2 fL (ref 78.0–100.0)
PLATELETS: 45 10*3/uL — AB (ref 150–400)
RBC: 2.61 MIL/uL — ABNORMAL LOW (ref 3.87–5.11)
RDW: 19.5 % — AB (ref 11.5–15.5)
WBC: 4.3 10*3/uL (ref 4.0–10.5)

## 2016-02-16 LAB — BRAIN NATRIURETIC PEPTIDE: B Natriuretic Peptide: 586.4 pg/mL — ABNORMAL HIGH (ref 0.0–100.0)

## 2016-02-16 LAB — HAPTOGLOBIN

## 2016-02-16 LAB — PROTIME-INR
INR: 4.51 — AB
Prothrombin Time: 44 seconds — ABNORMAL HIGH (ref 11.4–15.2)

## 2016-02-16 NOTE — Progress Notes (Signed)
Physical Therapy Treatment Patient Details Name: Jamie Howell MRN: [id number] DOB: 05/06/1950 Today's Date: 02/16/2016    History of Present Illness Jamie Howell is a 65 y.o. 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 Pt c/o nausea, dizziness/near syncope. Pt found to have and is being treated for supratherapeutic INR, anemia, mild dehydration, and chronic valvular heart disease.    PT Comments    Patient reports being sad today as she was told not great news from the physicians. Supposed to be meeting with palliative care tomorrow with son present to discuss options but agreeable to participate in therapy. Improved ambulation distance with use of RW for energy conservation. Pt will have support from son at home. Would benefit from rollator. Will follow.   Follow Up Recommendations  Home health PT;Supervision - Intermittent     Equipment Recommendations  Other (comment) (rollator)    Recommendations for Other Services       Precautions / Restrictions Restrictions Weight Bearing Restrictions: No    Mobility  Bed Mobility               General bed mobility comments: Sitting EOB upon PT arrival.   Transfers Overall transfer level: Needs assistance Equipment used: Rolling walker (2 wheeled) Transfers: Sit to/from Stand Sit to Stand: Supervision         General transfer comment: Supervision for safety. Stood from [employer]OB x1, from toilet x1.   Ambulation/Gait Ambulation/Gait assistance: Supervision Ambulation Distance (Feet): 250 Feet Assistive device: Rolling walker (2 wheeled) Gait Pattern/deviations: Step-through pattern;Decreased stride length Gait velocity: decreased Gait velocity interpretation: Below normal  speed for age/gender General Gait Details: Slow, steady gait using RW for energy conservation. HR stable.    Stairs            Wheelchair Mobility    Modified Rankin (Stroke Patients Only)       Balance Overall balance assessment: Needs assistance Sitting-balance support: Feet supported;No upper extremity supported Sitting balance-Leahy Scale: Good     Standing balance support: During functional activity Standing balance-Leahy Scale: Good Standing balance comment: Able to stand at sink and perform ADLs- washing hands and reaching outside BoS without difficulty.                    Cognition Arousal/Alertness: Awake/alert Behavior During Therapy: WFL for tasks assessed/performed Overall Cognitive Status: Within Functional Limits for tasks assessed                      Exercises      General Comments        Pertinent Vitals/Pain Pain Assessment: No/denies pain    Home Living                      Prior Function            PT Goals (current goals can now be found in the care plan section) Progress towards PT goals: Progressing toward goals    Frequency    Min 3X/week      PT Plan Current plan remains appropriate    Co-evaluation             End of Session Equipment Utilized During Treatment: Gait belt Activity Tolerance: Patient tolerated treatment well Patient left: in chair;with call  bell/phone within reach     Time: 1330-1350 PT Time Calculation (min) (ACUTE ONLY): 20 min  Charges:  $Gait Training: 8-22 mins                    G Codes:      Jamie Howell A Jamie Howell 02/16/2016, 2:31 PM

## 2016-02-16 NOTE — Care Management Important Message (Signed)
Important Message  Patient Details  Name: Jamie Howell MRN: [id number] Date of Birth: 09/15/1950   Medicare Important Message Given:  Yes    Kyla BalzarineShealy, Estill Llerena Abena 02/16/2016, 9:49 AM

## 2016-02-16 NOTE — Progress Notes (Signed)
On standby to do endoscopic evaluation, in view of 5 g drop in hemoglobin over the past month and associated heme positive stool on admission; however, patient's INR this morning remains supratherapeutic at 4.51, so we will not plan for endoscopy today.   It is noted the patient's hemoglobin is back up to 8.1 (it had drifted down to 7.5 yesterday, probably the result of laboratory variation), so there is no evidence of active bleeding at this time.  Once again, I will make the patient NPO after midnight tonight, so we have the potential to do endoscopy tomorrow if the INR should come down into the therapeutic range.  Patient reports 1 formed dark stool yesterday, consistent with her being on iron supplementation.  Florencia Reasonsobert V. Karagan Lehr, M.D. Pager [phone] If no answer or after 5 PM call [phone]

## 2016-02-16 NOTE — Progress Notes (Addendum)
Patient Name: Jamie Howell Date of Encounter: 02/16/2016  Primary Cardiologist: Dr. 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 Endorses orthopnea.   Inpatient Medications    Scheduled Meds: . sodium chloride   Intravenous Once  . amiodarone  200 mg Oral Daily  . calcium carbonate   Oral Q lunch  . cyclobenzaprine  5 mg Oral QHS  . ferrous sulfate  325 mg Oral Q breakfast  . levothyroxine  150 mcg Oral QAC breakfast  . metoprolol succinate  12.5 mg Oral Daily  . torsemide  20 mg Oral Daily  . Warfarin - Pharmacist Dosing Inpatient   Does not apply q1800   Continuous Infusions:  PRN Meds: ondansetron **OR** ondansetron (ZOFRAN) IV   Vital Signs    Vitals:   02/15/16 0624 02/15/16 1300 02/15/16 2100 02/16/16 0405  BP: (!) 107/59 115/69 102/84 98/65  Pulse: 69 67 70   Resp: (!) 23 (!) 21 18   Temp: 97.6 F (36.4 C) 98 F (36.7 C) 97.6 F (36.4 C) 97.7 F (36.5 C)  TempSrc: Oral Oral Oral Oral  SpO2: 100% 100% 100%   Weight: 194 lb 11.2 oz (88.3 kg)   195 lb 12.8 oz (88.8 kg)  Height:        Intake/Output Summary (Last 24 hours) at 02/16/16 0855 Last data filed at 02/16/16 0600  Gross per 24 hour  Intake              720 ml  Output              850 ml  Net             -130 ml   Filed Weights   02/14/16 0636 02/15/16 0624 02/16/16 0405  Weight: 192 lb 11.2 oz (87.4 kg) 194 lb 11.2 oz (88.3 kg) 195 lb 12.8 oz (88.8 kg)    Physical Exam   GEN: Ill appearing female in no acute distress.  HEENT: Grossly  normal.  Neck: Supple, no JVD, carotid bruits, or masses. Cardiac: RRR, no murmurs, + mechanical valve click, No rubs, or gallops. No clubbing, cyanosis, edema.  Radials/DP/PT 2+ and equal bilaterally.  Respiratory:  Respirations regular and unlabored, clear to auscultation bilaterally. GI: Soft, nontender, nondistended, BS + x 4. MS: no deformity or atrophy. Skin: warm and dry, no rash. Neuro:  Strength and sensation are intact. Psych: AAOx3.  Normal affect.  Labs    CBC  Recent Labs  02/15/16 0413 02/16/16 0506  WBC 5.0 4.3  HGB 7.5* 8.1*  HCT 23.0* 25.1*  MCV 95.0 96.2  PLT 52* 45*   Basic Metabolic Panel  Recent Labs  02/15/16 0921  NA 135  K 4.3  CL 103  CO2 25  GLUCOSE 128*  BUN 21*  CREATININE 1.43*  CALCIUM 8.1*   Liver Function Tests  Recent Labs  02/14/16 1123  AST 49*  ALT 29  ALKPHOS 115  BILITOT 2.7*  PROT 5.4*  ALBUMIN 2.4*  Telemetry    NSR- Personally Reviewed  ECG    NSR - Personally Reviewed  Radiology    [place] Abdomen Limited  Result Date: 02/14/2016 CLINICAL DATA:  Chronic liver disease, for elastography evaluation EXAM: LIMITED ABDOMEN ULTRASOUND FOR ASCITES TECHNIQUE: Limited ultrasound survey for ascites was performed in all four abdominal quadrants. COMPARISON:  Ultrasound abdomen 05/24/2015 FINDINGS: FINDINGS Significant ascites is present including perihepatic. This prevents performance of an adequate elastography exam due to the depth of liver from the skin surface as well as mobility liver within the ascites. Hepatic margin appears nodular suggesting cirrhosis. IMPRESSION: Significant ascites, preventing performance of an adequate elastography exam. May reconsider performing elastography following paracentesis, or at a delayed date following discharge if ascites results. Electronically Signed   By: Ulyses SouthwardMark  Boles M.D.   On: 02/14/2016 16:35   Patient Profile     Jamie Schleinrene C Elsesseris a 65 y.o.femalewith rheumatic heart  disease, status post mechanical mitral valve replacement, and status post aortic valve replacement x2, currently abovine valve, on chronic Coumadin with no prior history of GI bleeding.  Presented to the ED on 02/08/16 with weakness and presyncope, INR was >10, Hgb was 7.2 on admission then dropped to 5.7 overnight.    Assessment & Plan    1. Supratherapeutic INR/posthemorrhagic anemia: Appreciate GI consultation, plan for endoscopy tomorrow if INR < 3.5.  Coumadin on hold.  Suspect that INR continues to increase due to underlying coagulopathy from cirrhosis.    2. History of persistent atrial fibrillation: Sinus brady this am.   3. Mitral valve replacement: On chronic coumadin, which is being held but INR continues to rise.   4. Chronic combined systolic and diastolic CHF: She has significant ascites on US secondary to cardiac cirrhosis.  She is very tenuous with fluid status.  She did not tolerate aldactone due to nausea.  Metolazone was stopped due to orthostasis and worsening renal function.   She has end stage right heart failure and we are running out of options.  She is volume overloaded with ascites and RHF but increase diuresis leads to hypotension and dizziness.  She has had decreased appetite and nausea most likely due to ascites and cirrhosis.  We may be nearing need for discussion on Palliative Care.  Ultimately her options are very limited at this time.  Will continue to diurese as BP allows.  5. Severe anemia ? Blood loss although iron studies are normal.  She does have heme positive stools but is on iron.  Another consideration would be hemolysis from MVR.  Her indirect bili and LDH are elevated.Retic count elevated as well.  GI following and plan for endocscopy tomorrow.    Signed, Little IshikawaErin E Smith, NP  02/16/2016, 8:55 AM   Patient seen and independently examined with Suzzette RighterErin Smith, NP. We discussed all aspects of the encounter. I agree with the assessment and plan as stated  above.  Patient continues to feel poorly.  Her abdominal swelling has increased due to ascites from cardiac cirrhosis.  Discussed case with Dr. Gala RomneyBensimhon in regards to RHC and possible PICC line with Milrinone.  This would likely only give her a few additional months and significantly increase her risk for bacteremia and possible endocarditis on her MVR  I had a long discussion with Karena Addisonrene today in regards to further therapy vs. Palliative Care.  She realizes that she has minimal options left and at best the options are only palliative for a few months.  Offered to consider symptomatic paracentesis for  severe ascites which is likely contributing to her SOB.  Will continue Demadex for now and increase to 20mg  BID if UOP does not pick up and BP remains stable.  I will consult Palliative Care today and plan a family meeting for tomorrow as she is very worried about how her son will react.  I have spent a total of 45 minutes with patient reviewing GI notes , telemetry, EKGs, labs and examining patient as well as establishing an assessment and plan that was discussed with the patient.  > 50% of time was spent in direct patient care.    Signed: Armanda Magic, MD [facility] 02/16/2016

## 2016-02-16 NOTE — Progress Notes (Signed)
ANTICOAGULATION CONSULT NOTE - Follow Up Consult  Pharmacy Consult for Coumadin Indication: mechanical valves (on coumadin PTA)  Allergies  Allergen Reactions  . Adhesive [Tape] Itching    Adhesive tape and backing (pls use elastic bandages with no adhesive)  . Spironolactone Other (See Comments)    Severe dizziness and nausea  . Penicillins Rash    Has patient had a PCN reaction causing immediate rash, facial/tongue/throat swelling, SOB or lightheadedness with hypotension: Yes Has patient had a PCN reaction causing severe rash involving mucus membranes or skin necrosis: No Has patient had a PCN reaction that required hospitalization No Has patient had a PCN reaction occurring within the last 10 years: No If all of the above answers are "NO", then may proceed with Cephalosporin use.   . Sulfa Antibiotics Rash    Patient Measurements: Height: 4\' 11"  (149.9 cm) Weight: 195 lb 12.8 oz (88.8 kg) IBW/kg (Calculated) : 43.2  Heparin Dosing Weight: 65kg (based on IBW of 45.5 kgs)  Vital Signs: Temp: 98 F (36.7 C) (11/16 1300) Temp Source: Oral (11/16 1300) BP: 104/56 (11/16 1037) Pulse Rate: 69 (11/16 1300)  Labs:  Recent Labs  02/14/16 0353 02/15/16 0413 02/15/16 0656 02/15/16 0921 02/16/16 0506  HGB 7.4* 7.5*  --   --  8.1*  HCT 22.8* 23.0*  --   --  25.1*  PLT 51* 52*  --   --  45*  LABPROT 60.2* >90.0* 46.4*  --  44.0*  INR 6.66* >10.00* 4.81*  --  4.51*  CREATININE  --   --   --  1.43*  --     Estimated Creatinine Clearance: 38 mL/min (by C-G formula based on SCr of 1.43 mg/dL (H)).  Assessment: 65 y.o. female with mechanical MVR (04/08/1991) and TAVR (09/28/14) for anticoagulation.  On coumadin PTA, INR on admit >10.0 on 02/18/16 S/p Vit K had been given since 11/8 and INR decreased to 2.21 on 11/9 pm.  Coumadin resumed 11/10 and Heparin drip started on 11/10 then heparin DC'd 11/12 when INR up to 3.4. (Goal INR 2.5-3.5) On 11/3 noted to have heme + stool and  INR supratherapeutic -Today INR remains subtherapeutic but has decreased to 4.81 -Hgb 7.5 and stable, s/p PRBC x 2  11/9 AM -Pltc 48>51>52k- stable, chronically low (48 10/2014) secondary liver dz. GI consulted- plan for endoscopy when  INR tomorrow if INR <3.5  GI noted to consider alternative anticoagulant therapy in view of her liver disease, renal dysfunction, and difficulty maintaining a stable INR on warfarin.  However the NOAC's (Xarelto, Eliquis) are not approved to use for mechanical MVR and TAVR for anticoagulation.     Goal of Therapy:  INR 2.5-3.5 Monitor platelets by anticoagulation protocol: Yes   Plan:  No Coumadin tonight. Awaiting INR to decline for possible endoscopy.  F/u to restart IV hepairn if INR <2.5 Daily INR, CBC Monitor for S/Sx of bleeding   Thank you for allowing pharmacy to be part of this patients care team. Noah Delaineuth Cornelis Kluver, RPh Clinical Pharmacist Pager: [phone] 02/16/2016 5:46 PM

## 2016-02-17 ENCOUNTER — Inpatient Hospital Stay (HOSPITAL_COMMUNITY): Payer: PPO | Admitting: Anesthesiology

## 2016-02-17 ENCOUNTER — Encounter (HOSPITAL_COMMUNITY)
Admission: AD | Disposition: A | Discharge status: Hospice | Payer: Self-pay | Source: Ambulatory Visit | Attending: Cardiology

## 2016-02-17 ENCOUNTER — Encounter (HOSPITAL_COMMUNITY): Payer: Self-pay | Admitting: *Deleted

## 2016-02-17 DIAGNOSIS — Z515 Encounter for palliative care: Secondary | ICD-10-CM

## 2016-02-17 HISTORY — PX: ESOPHAGOGASTRODUODENOSCOPY (EGD) WITH PROPOFOL: SHX5813

## 2016-02-17 LAB — PROTIME-INR
INR: 3.29
PROTHROMBIN TIME: 34.2 s — AB (ref 11.4–15.2)

## 2016-02-17 LAB — CBC
HCT: 25.8 % — ABNORMAL LOW (ref 36.0–46.0)
HEMOGLOBIN: 8.2 g/dL — AB (ref 12.0–15.0)
MCH: 30.5 pg (ref 26.0–34.0)
MCHC: 31.8 g/dL (ref 30.0–36.0)
MCV: 95.9 fL (ref 78.0–100.0)
PLATELETS: 48 10*3/uL — AB (ref 150–400)
RBC: 2.69 MIL/uL — ABNORMAL LOW (ref 3.87–5.11)
RDW: 19.7 % — AB (ref 11.5–15.5)
WBC: 4.3 10*3/uL (ref 4.0–10.5)

## 2016-02-17 SURGERY — ESOPHAGOGASTRODUODENOSCOPY (EGD) WITH PROPOFOL
Anesthesia: Monitor Anesthesia Care

## 2016-02-17 MED ORDER — MORPHINE SULFATE (CONCENTRATE) 10 MG/0.5ML PO SOLN
5.0000 mg | ORAL | Status: DC | PRN
  Administered 2016-02-18: 5 mg via ORAL
  Filled 2016-02-17: qty 0.5

## 2016-02-17 MED ORDER — MIDAZOLAM HCL 5 MG/5ML IJ SOLN
INTRAMUSCULAR | Status: DC | PRN
  Administered 2016-02-17: 1 mg via INTRAVENOUS

## 2016-02-17 MED ORDER — LIDOCAINE HCL (CARDIAC) 20 MG/ML IV SOLN
INTRAVENOUS | Status: DC | PRN
  Administered 2016-02-17: 40 mg via INTRAVENOUS

## 2016-02-17 MED ORDER — BUTAMBEN-TETRACAINE-BENZOCAINE 2-2-14 % EX AERO
INHALATION_SPRAY | CUTANEOUS | Status: DC | PRN
  Administered 2016-02-17: 1 via TOPICAL

## 2016-02-17 MED ORDER — SODIUM CHLORIDE 0.9 % IV SOLN: INTRAVENOUS | Status: DC

## 2016-02-17 MED ORDER — PHENYLEPHRINE HCL 10 MG/ML IJ SOLN
INTRAMUSCULAR | Status: DC | PRN
  Administered 2016-02-17: 80 ug via INTRAVENOUS

## 2016-02-17 MED ORDER — PROPOFOL 500 MG/50ML IV EMUL
INTRAVENOUS | Status: DC | PRN
  Administered 2016-02-17: 50 ug/kg/min via INTRAVENOUS

## 2016-02-17 MED ORDER — FENTANYL CITRATE (PF) 100 MCG/2ML IJ SOLN: 25.0000 ug | INTRAMUSCULAR | Status: DC | PRN

## 2016-02-17 MED ORDER — ONDANSETRON HCL 4 MG/2ML IJ SOLN: 4.0000 mg | Freq: Once | INTRAMUSCULAR | Status: DC | PRN

## 2016-02-17 MED ORDER — WARFARIN SODIUM 2.5 MG PO TABS
2.5000 mg | ORAL_TABLET | Freq: Once | ORAL | Status: AC
  Administered 2016-02-17: 2.5 mg via ORAL
  Filled 2016-02-17: qty 1

## 2016-02-17 MED ORDER — LACTATED RINGERS IV SOLN
INTRAVENOUS | Status: DC | PRN
  Administered 2016-02-17: 11:00:00 via INTRAVENOUS

## 2016-02-17 NOTE — Progress Notes (Signed)
Attempting to reach family for GOC discussion. LM with son, Jamie Howell. Will continue and FU. Eduard RouxSarah Hedi Barkan, ANP

## 2016-02-17 NOTE — Progress Notes (Signed)
Patient's endoscopy today (please see procedure report) showed portal gastropathy and moderately severe esophageal varices.  These findings were discussed with the patient, and also with Suzzette RighterErin Smith, NP of the cardiology service.  For now, the best course of action seems to be to see how the patient does being switched to a noncardioselective beta blocker.  If the patient has future bleeding episodes which are documented, or strongly suggestive, of variceal origin (versus portal gastropathy, which was clinically the more likely culprit this time), consideration could then be given to either variceal banding ligation or TIPS, either of which would be of high risk, but especially the latter.  Florencia Reasonsobert V. Mozell Hardacre, M.D. Pager [phone] If no answer or after 5 PM call [phone]

## 2016-02-17 NOTE — Anesthesia Preprocedure Evaluation (Signed)
Anesthesia Evaluation  Patient identified by MRN, date of birth, ID band Patient awake    Reviewed: Allergy & Precautions, NPO status , Patient's Chart, lab work & pertinent test results, reviewed documented beta blocker date and time   Airway Mallampati: II  TM Distance: >3 FB Neck ROM: Full    Dental  (+) Edentulous Upper, Edentulous Lower   Pulmonary shortness of breath and with exertion,    Pulmonary exam normal breath sounds clear to auscultation       Cardiovascular hypertension, Pt. on medications and Pt. on home beta blockers +CHF  + dysrhythmias Atrial Fibrillation  Rhythm:Irregular Rate:Abnormal + Systolic Click A-fib   Neuro/Psych negative neurological ROS  negative psych ROS   GI/Hepatic negative GI ROS,   Endo/Other  Hypothyroidism Morbid obesityObesity   Renal/GU Renal InsufficiencyRenal disease     Musculoskeletal negative musculoskeletal ROS (+)   Abdominal (+) + obese,   Peds  Hematology negative hematology ROS (+)   Anesthesia Other Findings   Reproductive/Obstetrics negative OB ROS                             Anesthesia Physical  Anesthesia Plan  ASA: III  Anesthesia Plan: MAC   Post-op Pain Management:    Induction: Intravenous  Airway Management Planned: Mask  Additional Equipment:   Intra-op Plan:   Post-operative Plan:   Informed Consent: I have reviewed the patients History and Physical, chart, labs and discussed the procedure including the risks, benefits and alternatives for the proposed anesthesia with the patient or authorized representative who has indicated his/her understanding and acceptance.   Dental advisory given  Plan Discussed with: CRNA and Surgeon  Anesthesia Plan Comments: (Discussed risks/benefits/alternatives to MAC sedation including need for ventilatory support, hypotension, need for conversion to general anesthesia.  All  patient questions answered.  Patient wished to proceed.)        Anesthesia Quick Evaluation S/P TAVR 09/27/24  S/P MVR 1993 for rheumatic disease on coumadin  INR 2.43 H/O portal htn and splenomegaly Chronic thrombocytopenia platelet count 69,000,  Renal insufficiency Cr 1.66 H/O Paroxysmal afib on amiodarone now in Afib

## 2016-02-17 NOTE — Op Note (Signed)
[facility] Name[facility] How[facility] Procedure Date : 02/17/2016 MRN: [id number] Attending MD: Bernette Redbirdobert Garreth Burnsworth , MD Date of Birth: 04/02/1950 CSN: [id number] Age: [age] Admit Type: Inpatient Procedure:                Upper GI endoscopy Indications:              Acute post hemorrhagic anemia. This 65 year old                            patient with mechanical mitral valve replacement                            came into the hospital severely over                            anticoagulated, INR greater than 10, with a several                            week history of intermittent dark stools and                            progressive weakness and shortness of breath and                            was found to have a hemoglobin of 5.7. 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                            She has been without evidence of bleeding since                            admission. Providers:                Bernette Redbirdobert Makell Cyr, MD, Dwain SarnaPatricia Ford, RN, Beryle BeamsJanie                            Billups, Technician, Coralee Rudobert Flores, CRNA Referring MD:              Medicines:                Propofol per Anesthesia Complications:            No immediate complications. Estimated Blood Loss:     Estimated blood loss: none. Procedure:                Pre-Anesthesia Assessment:                           - Prior to the procedure, a History and Physical  was performed, and patient medications and                            allergies were reviewed. The patient's tolerance of                            previous anesthesia was also reviewed. The risks                            and benefits of the procedure and the sedation                            options and risks were discussed with the patient.                            All questions were  answered, and informed consent                            was obtained. Prior Anticoagulants: The patient has                            taken Coumadin (warfarin), last dose was                            approximate 2 days prior to procedure (INR 3.29                            this morning). ASA Grade Assessment: IV - A patient                            with severe systemic disease that is a constant                            threat to life. After reviewing the risks and                            benefits, the patient was deemed in satisfactory                            condition to undergo the procedure.                           After obtaining informed consent, the endoscope was                            passed under direct vision. Throughout the                            procedure, the patient's blood pressure, pulse, and                            oxygen saturations were monitored continuously. The  ZO-[id number] ([id number]) scope was introduced through the                            mouth, and advanced to the second part of duodenum.                            The patient tolerated the procedure well. Scope In: Scope Out: Findings:      Grade II varices were found in the middle third of the esophagus and in       the lower third of the esophagus.      The exam of the esophagus was otherwise normal.      Moderate portal hypertensive gastropathy was found in the gastric fundus.      The exam of the stomach was otherwise normal. There was no blood in the       stomach.      The cardia and gastric fundus were normal on retroflexion.      There is no endoscopic evidence of varices in the cardia.      There is no endoscopic evidence of erythema or inflammatory changes       suggestive of gastritis, ulceration, angiodysplasia or erosion in the       entire examined stomach.      The examined duodenum was normal. Impression:               - Grade II esophageal  varices.                           - Portal hypertensive gastropathy.                           - Normal examined duodenum.                           - No specimens collected. Moderate Sedation:      This patient was sedated with monitored anesthesia care, not moderate       sedation. Recommendation:           - Observe patient's clinical course.                           - I elected not to perform banding in this patient                            for several reasons: she was not actively bleeding,                            and her prior pattern of bleeding is more                            consistent with portal gastropathy than variceal                            bleeding anyway. Moreover, she would be at                            incredibly high risk for complications  given her                            requirement for ongoing anticoagulation and the                            marked vascillations in her INR.                           - Will plan to discuss the idea of a prophylactic                            TIPS with patient, although risk of nephrotoxicity                            from procedure would be high.                           - In the meantime, one reasonably safe, easy, and                            possibly effective means of prophylaxis against                            variceal hemorrhage would be to convert the patient                            from metoprolol to an noncardio-selective beta                            blocker (eg nadolol or propranolol) unless there is                            a medical contraindication to doing so.                           - Continue present medications.                           - Resume previous diet. Procedure Code(s):        --- Professional ---                           [phone], Esophagogastroduodenoscopy, flexible,                            transoral; diagnostic, including collection of                             specimen(s) by brushing or washing, when performed                            (separate procedure) Diagnosis Code(s):        --- Professional ---  I85.00, Esophageal varices without bleeding                           K76.6, Portal hypertension                           D62, Acute posthemorrhagic anemia                           K31.89, Other diseases of stomach and duodenum CPT copyright 2016 [employer]. All rights reserved. The codes documented in this report are preliminary and upon coder review may  be revised to meet current compliance requirements. Bernette Redbird, MD 02/17/2016 12:12:54 PM This report has been signed electronically. Number of Addenda: 0

## 2016-02-17 NOTE — Progress Notes (Signed)
Patient Name: Jamie Howell Date of Encounter: 02/17/2016  Primary Cardiologist: Dr. 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   Inpatient Medications    Scheduled Meds: . sodium chloride   Intravenous Once  . amiodarone  200 mg Oral Daily  . calcium carbonate   Oral Q lunch  . cyclobenzaprine  5 mg Oral QHS  . ferrous sulfate  325 mg Oral Q breakfast  . levothyroxine  150 mcg Oral QAC breakfast  . metoprolol succinate  12.5 mg Oral Daily  . torsemide  20 mg Oral Daily  . Warfarin - Pharmacist Dosing Inpatient   Does not apply q1800   Continuous Infusions:  PRN Meds: fentaNYL (SUBLIMAZE) injection, ondansetron **OR** ondansetron (ZOFRAN) IV, ondansetron (ZOFRAN) IV   Vital Signs    Vitals:   02/17/16 1200 02/17/16 1210 02/17/16 1220 02/17/16 1246  BP: (!) 106/52 (!) 88/53 (!) 97/52 (!) 108/92  Pulse: 71 61 66 66  Resp: 13 16 18    Temp: 98 F (36.7 C)     TempSrc: Oral     SpO2: 100% 98% 99%   Weight:      Height:        Intake/Output Summary (Last 24 hours) at 02/17/16 1335 Last data filed at 02/17/16 1149  Gross per 24 hour  Intake              340 ml  Output             1450 ml  Net            -1110 ml   Filed Weights   02/14/16 0636 02/15/16 0624 02/16/16 0405  Weight: 192 lb 11.2 oz (87.4 kg) 194 lb 11.2 oz (88.3 kg) 195 lb 12.8 oz (88.8 kg)    Physical Exam   GEN: Ill appearing female in no acute distress.  HEENT: Grossly normal.  Neck: Supple, no JVD, carotid bruits, or masses. Cardiac: RRR,  no murmurs, + mechanical valve click, No rubs, or gallops. No clubbing, cyanosis, edema.  Radials/DP/PT 2+ and equal bilaterally.  Respiratory:  Respirations regular and unlabored, clear to auscultation bilaterally. GI: Soft, nontender, nondistended, BS + x 4. MS: no deformity or atrophy. Skin: warm and dry, no rash. Neuro:  Strength and sensation are intact. Psych: AAOx3.  Normal affect.  Labs    CBC  Recent Labs  02/16/16 0506 02/17/16 0436  WBC 4.3 4.3  HGB 8.1* 8.2*  HCT 25.1* 25.8*  MCV 96.2 95.9  PLT 45* 48*   Basic Metabolic Panel  Recent Labs  02/15/16 0921  NA 135  K 4.3  CL 103  CO2 25  GLUCOSE 128*  BUN 21*  CREATININE 1.43*  CALCIUM 8.1*   Liver Function Tests No results for input(s): AST, ALT, ALKPHOS, BILITOT, PROT, ALBUMIN in the last 72 hours.   Telemetry    NSR- Personally Reviewed  ECG    NSR - Personally Reviewed  Radiology    No results found. Patient  Profile     Jamie Howell a 65 y.o.femalewith rheumatic heart disease, status post mechanical mitral valve replacement, and status post aortic valve replacement x2, currently abovine valve, on chronic Coumadin with no prior history of GI bleeding.  Presented to the ED on 02/08/16 with weakness and presyncope, INR was >10, Hgb was 7.2 on admission then dropped to 5.7 overnight.    Assessment & Plan    1. Supratherapeutic INR/posthemorrhagic anemia: Appreciate GI consultation, now s/p endoscopy with evidence of portal hypertensive gastropathy with grade II esophageal varices. GI mentioned possible TIPS procedure but patient is too sick and given her advanced RHF, her prognosis is poor with limited life expectancy.  Suspect that INR continues to increase due to underlying coagulopathy from cirrhosis.  INR now improved off coumadin.  Pharmacy managing.  She will not tolerate BB therapy at this time due to soft BP and actually stopping Toprol today.   2. History of persistent atrial  fibrillation: Sinus brady this am on Amio and BB.  Her BP is soft so will stop Toprol.   3. Mitral valve replacement: On chronic coumadin, which is being held but INR now coming back down.  Pharmacy managing.   4. Chronic combined systolic and diastolic CHF: She has significant ascites on US secondary to cardiac cirrhosis.  She is very tenuous with fluid status.  She did not tolerate aldactone due to nausea.  Metolazone was stopped due to orthostasis and worsening renal function.   She has end stage right heart failure and we are running out of options.  She is volume overloaded with ascites and RHF but increase diuresis leads to hypotension and dizziness.  She has had decreased appetite and nausea most likely due to ascites and cirrhosis.  I discussed Palliative Care with her yesterday and await consult.  Ultimately her options are very limited at this time.  Will continue to diurese as BP allows.  5. Severe anemia ? Blood loss although iron studies are normal.  She does have heme positive stools but is on iron.  Another consideration would be hemolysis from MVR.  Her indirect bili and LDH are elevated.Retic count elevated as well. Haptoglobin is low all pointing to possible hemolysis from mechanical MVR.  No obvious bleeding noted with varices on endoscopy.   Signed, Armanda Magicraci Biridiana Twardowski, MD  02/17/2016, 1:35 PM

## 2016-02-17 NOTE — Anesthesia Postprocedure Evaluation (Signed)
Anesthesia Post Note  Patient: Jamie Howell  Procedure(s) Performed: Procedure(s) (LRB): ESOPHAGOGASTRODUODENOSCOPY (EGD) WITH PROPOFOL (N/A)  Patient location during evaluation: Endoscopy Anesthesia Type: MAC Level of consciousness: awake Pain management: pain level controlled Vital Signs Assessment: post-procedure vital signs reviewed and stable Respiratory status: spontaneous breathing Cardiovascular status: stable Postop Assessment: no signs of nausea or vomiting Anesthetic complications: no     Last Vitals:  Vitals:   02/17/16 1210 02/17/16 1220  BP: (!) 88/53 (!) 97/52  Pulse: 61 66  Resp: 16 18  Temp:      Last Pain:  Vitals:   02/17/16 1200  TempSrc: Oral  PainSc:    Pain Goal: Patients Stated Pain Goal: 0 (02/16/16 2030)               Kloi Brodman JR,JOHN Susann GivensFRANKLIN

## 2016-02-17 NOTE — Progress Notes (Signed)
Palliative Medicine RN Note: Rec'd consult from cardiology NP E Smith requesting we meet with family, Dr Mayford Knifeurner, and primary; cardiology is attending. Paged Dr Mayford Knifeurner requesting time she will round today so we can have a PMT provider and the family available.  PMT will be available during the day today and over the weekend. To reach the provider covering for the weekend, please call [phone] between 7a and 7p.  Margret ChanceMelanie G. Hadiyah Maricle, RN, BSN, [facility] DistrictCHPN 02/17/2016 12:39 PM Cell [phone] 8:00-4:00 Monday-Friday Office [phone]

## 2016-02-17 NOTE — Consult Note (Signed)
Consultation Note Date: 02/17/2016   Patient Name: Jamie Howell C Jamie Howell  DOB: 02/02/1951  MRN: [id number]  Age / Sex: 65 y.o., female  PCP: Merri BrunetteWalter [name], MD Referring Physician: Jake BatheMark C Skains, MD  Reason for Consultation: Establishing goals of care, Hospice Evaluation, Non pain symptom management, Pain control and Psychosocial/spiritual support  HPI/Patient Profile: 65 y.o. female  with past medical history of Rheumatic heart disease, status post mitral valve replacement, TAVR, pulmonary hypertension, thrombocytopenia, cardiac cirrhosis, chronic kidney disease stage III, supratherapeutic INR, anemia, admitted on 02/08/2016 with weakness, presyncopal episode. Patient was found to have an INR greater than 10 and her hemoglobin was 5.7. Patient has end-stage combined heart failure and unfortunately at this point is having difficulty being diuresed because of hypotension. She also has thrombocytopenia related to cardiac cirrhosis and underwent an endoscopy today. She was found to have level II esophageal varices but they were not bleeding. No banding was performed. Patient is not a candidate for valvular replacement for further aggressive interventions such as a Tipps procedure.   Clinical Assessment and Goals of Care: Patient is alert and oriented 4. She is just back from endoscopy. She verbalizes good understanding of her terminal illness and is requesting to go home with hospice. Terms of DO NOT RESUSCITATE and full code were explained to patient and family. She did not reach a decision on DO NOT RESUSCITATE today but she can elect hospice as a full code and would like to proceed with discharge plans. Did discuss in length management of dyspnea with the use of opioids. Patient and son were in agreement  Patient at this point is capable of making her own decisions but her surrogate healthcare decision maker would be her son  Harlin Raindmond, with whom she lives    SUMMARY OF RECOMMENDATIONS   Continue full code for now. Family is aware that she has an acute event overnight that she would undergo aggressive measures such as CPR or defibrillation and intubation Home with hospice as soon as possible Order placed to care management Patient will need oxygen hospital bed and other equipment prior to being discharged. This is in case management's consult note We'll start low-dose morphine while she is in the hospital for management of dyspnea Code Status/Advance Care Planning:  Full code    Symptom Management:   Dyspnea: Continue with targeted pulmonary treatments such as nebulizer treatments as well as oxygen as needed. We'll add morphine concentrate 5 mg every 4 when necessary for shortness of breath  Palliative Prophylaxis:   Aspiration, Bowel Regimen, Delirium Protocol, Eye Care, Frequent Pain Assessment, Oral Care and Turn Reposition  Additional Recommendations (Limitations, Scope, Preferences):  Avoid Hospitalization, Minimize Medications, No Artificial Feeding, No Chemotherapy, No Hemodialysis and No Tracheostomy  Psycho-social/Spiritual:   Desire for further Chaplaincy support:no  Additional Recommendations: Grief/Bereavement Support  Prognosis:   < 6 months in the setting of end-stage combined heart failure with an ejection fraction of 40%, cardiac cirrhosis with thrombocytopenia, chronic kidney disease stage 3-4 with most recent creatinine 1.43, albumin 2.4,  esophageal varices, likelihood very high ongoing severe anemia. Per cardiology, mitral valve is breaking up her red blood cells and causing her to be severely anemic.  Discharge Planning: Home with Hospice      Primary Diagnoses: Present on Admission: . Chronic combined systolic and diastolic CHF (congestive heart failure) (HCC) . 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 Persistent atrial fibrillation (HCC) . CKD (chronic kidney  disease) stage 3, GFR 30-59 ml/min . Cardiac cirrhosis . Pulmonary HTN . Supratherapeutic INR . Anemia . Thrombocytopenia (HCC)   I have reviewed the medical record, interviewed the patient and family, and examined the patient. The following aspects are pertinent.  Past Medical History:  Diagnosis Date  . Anemia   . Aortic valve stenosis    moderate aortic valve stenosis with moderate aortic insufficiency, stable MVR, trivial PR, mild TR, severe LAE, grade II-III diastolic dysfunction,mild pulmonary HTN , EF 50% by echo 12/2012  . Atypical atrial flutter (HCC)    s/p DCCV  . Chronic combined systolic (congestive) and diastolic (congestive) heart failure   . History of blood transfusion 09/2014   "during hospitalization"  . HTN (hypertension)   . Hypothyroidism   . 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 Obesity (BMI 30-39.9) 07/08/2014  . Paroxysmal atrial fibrillation (HCC)   . Paroxysmal atrial tachycardia (HCC)    s/p DCCV  . Pneumonia ~ 2014; 05/2014  . Portal hypertension with esophageal varices (HCC) 07/14/2014  . Pulmonary HTN    severe by echo 07/2015  . Rheumatic fever   . S/P mitral valve replacement with metallic valve 04/08/1991   31 mm St Jude bileaflet mechanical valve placed for rheumatic mitral stenosis  . 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 Splenomegaly 07/14/2014  . Subdural hematoma (HCC)    spontaneous  . Temporary low platelet count (HCC)   . Thyrotoxicosis     without mention of goiter or other cause, without mention of thyrotoxic crisis or storm   Social History   Social History  . Marital status: Single    Spouse name: N/A  . Number of children: 2  . Years of education: N/A   Occupational History  . circulation desk clerk    Social History Main Topics  . Smoking status: Never Smoker  .  Smokeless tobacco: Never Used  . Alcohol use No  . Drug use: No  . Sexual activity: Not Currently   Other Topics Concern  . None   Social History Narrative   Pt lives in [place] with son.  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   Family History  Problem Relation Age of Onset  . CAD Father   . Hypertension Father   . CAD Mother   . Cirrhosis Brother   . Heart attack Sister    Scheduled Meds: . sodium chloride   Intravenous Once  . amiodarone  200 mg Oral Daily  . calcium carbonate   Oral Q lunch  . cyclobenzaprine  5 mg Oral QHS  . ferrous sulfate  325 mg Oral Q breakfast  . levothyroxine  150 mcg Oral QAC breakfast  . torsemide  20 mg Oral Daily  . Warfarin - Pharmacist Dosing Inpatient   Does not apply q1800   Continuous Infusions: PRN Meds:.fentaNYL (SUBLIMAZE) injection, ondansetron **OR** ondansetron (ZOFRAN) IV, ondansetron (ZOFRAN) IV Medications  Prior to Admission:  Prior to Admission medications   Medication Sig Start Date End Date Taking? Authorizing Provider  acetaminophen (TYLENOL) 325 MG tablet Take 325-650 mg by mouth every 6 (six) hours as needed for headache.   Yes Historical Provider, MD  amiodarone (PACERONE) 200 MG tablet Take 1 tablet (200 mg total) by mouth daily. 11/25/15  Yes Quintella Reichert, MD  aspirin EC 81 MG tablet Take 1 tablet (81 mg total) by mouth daily. 12/10/13  Yes Quintella Reichert, MD  Calcium Carb-Cholecalciferol (CALCIUM 1000 + D PO) Take 1,000 mg by mouth daily with lunch.    Yes Historical Provider, MD  ferrous sulfate 325 (65 FE) MG tablet TAKE ONE TABLET BY MOUTH ONCE DAILY WITH BREAKFAST Patient taking differently: Take 325 mg by mouth once a day with breakfast 04/01/15  Yes Quintella Reichert, MD  levothyroxine (SYNTHROID, LEVOTHROID) 150 MCG tablet Take 150 mcg by mouth daily before breakfast.  10/18/14  Yes Historical Provider, MD  metoprolol succinate (TOPROL-XL) 50 MG 24 hr tablet Take 25 mg by mouth every morning.    Yes Historical  Provider, MD  potassium chloride SA (K-DUR,KLOR-CON) 20 MEQ tablet Take 20 mEq by mouth 2 (two) times daily. MORNING AND LUNCHTIME   Yes Historical Provider, MD  torsemide (DEMADEX) 20 MG tablet Take 20-40 mg by mouth See admin instructions. 20 mg on Sun/Tues/Thurs/Sat and 40 mg on Mon/Wed/Fri   Yes Historical Provider, MD  warfarin (COUMADIN) 5 MG tablet Take 0.5 tablets (2.5 mg total) by mouth at bedtime. Or as directed. Patient taking differently: Take 2.5-5 mg by mouth See admin instructions. 5 mg in the evening on Sun/Tues/Thurs/Sat and 2.5 mg on Mon/Wed/Fri 10/25/14  Yes Donielle Margaretann Loveless, PA-C   Allergies  Allergen Reactions  . Adhesive [Tape] Itching    Adhesive tape and backing (pls use elastic bandages with no adhesive)  . Spironolactone Other (See Comments)    Severe dizziness and nausea  . Penicillins Rash    Has patient had a PCN reaction causing immediate rash, facial/tongue/throat swelling, SOB or lightheadedness with hypotension: Yes Has patient had a PCN reaction causing severe rash involving mucus membranes or skin necrosis: No Has patient had a PCN reaction that required hospitalization No Has patient had a PCN reaction occurring within the last 10 years: No If all of the above answers are "NO", then may proceed with Cephalosporin use.   . Sulfa Antibiotics Rash   Review of Systems  Constitutional: Positive for activity change, appetite change, fatigue and unexpected weight change.  HENT: Negative.   Eyes: Negative.   Respiratory: Positive for cough, chest tightness and shortness of breath.   Cardiovascular: Positive for palpitations and leg swelling.  Gastrointestinal: Positive for abdominal distention.  Genitourinary: Negative.   Musculoskeletal: Negative.   Allergic/Immunologic: Negative.   Neurological: Positive for weakness.  Hematological: Bruises/bleeds easily.  Psychiatric/Behavioral: Positive for decreased concentration. The patient is nervous/anxious.       Physical Exam  Vital Signs: BP 124/62 (BP Location: Left Arm)   Pulse 70   Temp (!) 96.3 F (35.7 C) (Axillary)   Resp 17   Ht 4\' 11"  (1.499 m)   Wt 88.8 kg (195 lb 12.8 oz)   SpO2 95%   BMI 39.55 kg/m  Pain Assessment: No/denies pain   Pain Score: 0-No pain   SpO2: SpO2: 95 % O2 Device:SpO2: 95 % O2 Flow Rate: .O2 Flow Rate (L/min): 3 L/min  IO: Intake/output summary:  Intake/Output Summary (Last 24  hours) at 02/17/16 1810 Last data filed at 02/17/16 1747  Gross per 24 hour  Intake              340 ml  Output             2250 ml  Net            -1910 ml    LBM: Last BM Date: 02/16/16 Baseline Weight: Weight: 80.6 kg (177 lb 11.2 oz) Most recent weight: Weight: 88.8 kg (195 lb 12.8 oz)     Palliative Assessment/Data:   Flowsheet Rows   Flowsheet Row Most Recent Value  Intake Tab  Referral Department  Hospitalist  Unit at Time of Referral  Med/Surg Unit  Palliative Care Primary Diagnosis  Cardiac  Date Notified  02/16/16  Palliative Care Type  New Palliative care  Reason for referral  Clarify Goals of Care, Non-pain Symptom, Counsel Regarding Hospice, Psychosocial or Spiritual support  Date of Admission  02/08/16  Date first seen by Palliative Care  02/17/16  # of days Palliative referral response time  1 Day(s)  # of days IP prior to Palliative referral  8  Clinical Assessment  Palliative Performance Scale Score  50%  Pain Max last 24 hours  2  Pain Min Last 24 hours  0  Dyspnea Max Last 24 Hours  6  Dyspnea Min Last 24 hours  4  Nausea Max Last 24 Hours  0  Nausea Min Last 24 Hours  0  Anxiety Max Last 24 Hours  4  Other Max Last 24 Hours  2  Psychosocial & Spiritual Assessment  Palliative Care Outcomes  Patient/Family meeting held?  Yes  Who was at the meeting?  pt and son  Palliative Care Outcomes  Improved non-pain symptom therapy, Clarified goals of care, Counseled regarding hospice  Palliative Care follow-up planned  Yes, Facility       Time In: 1430 Time Out: 1545 Time Total: 75 min Greater than 50%  of this time was spent counseling and coordinating care related to the above assessment and plan.  Signed by: Irean HongSarah Grace Cordia Miklos, NP   Please contact Palliative Medicine Team phone at [phone] for questions and concerns.  For individual provider: See Loretha StaplerAmion

## 2016-02-17 NOTE — Transfer of Care (Signed)
Immediate Anesthesia Transfer of Care Note  Patient: Jamie Howell  Procedure(s) Performed: Procedure(s): ESOPHAGOGASTRODUODENOSCOPY (EGD) WITH PROPOFOL (N/A)  Patient Location: PACU and Endoscopy Unit  Anesthesia Type:MAC  Level of Consciousness: awake, alert , oriented and patient cooperative  Airway & Oxygen Therapy: Patient Spontanous Breathing and Patient connected to nasal cannula oxygen  Post-op Assessment: Report given to RN, Post -op Vital signs reviewed and stable, Patient moving all extremities and Patient moving all extremities X 4  Post vital signs: Reviewed and stable  Last Vitals:  Vitals:   02/17/16 1028 02/17/16 1200  BP: (!) 134/59 (!) 106/52  Pulse: 75 71  Resp: (!) 21 13  Temp: 36.6 C 36.7 C    Last Pain:  Vitals:   02/17/16 1200  TempSrc: Oral  PainSc:       Patients Stated Pain Goal: 0 (02/16/16 2030)  Complications: No apparent anesthesia complications

## 2016-02-17 NOTE — Progress Notes (Addendum)
ANTICOAGULATION CONSULT NOTE - Follow Up Consult  Pharmacy Consult for Coumadin Indication: mechanical valves (on Coumadin PTA)  Allergies  Allergen Reactions  . Adhesive [Tape] Itching    Adhesive tape and backing (pls use elastic bandages with no adhesive)  . Spironolactone Other (See Comments)    Severe dizziness and nausea  . Penicillins Rash    Has patient had a PCN reaction causing immediate rash, facial/tongue/throat swelling, SOB or lightheadedness with hypotension: Yes Has patient had a PCN reaction causing severe rash involving mucus membranes or skin necrosis: No Has patient had a PCN reaction that required hospitalization No Has patient had a PCN reaction occurring within the last 10 years: No If all of the above answers are "NO", then may proceed with Cephalosporin use.   . Sulfa Antibiotics Rash    Patient Measurements: Height: 4\' 11"  (149.9 cm) Weight: 195 lb 12.8 oz (88.8 kg) IBW/kg (Calculated) : 43.2  Heparin Dosing Weight: 65kg (based on IBW of 45.5 kgs)  Vital Signs: Temp: 96.3 F (35.7 C) (11/17 1418) Temp Source: Axillary (11/17 1418) BP: 124/62 (11/17 1418) Pulse Rate: 70 (11/17 1418)  Labs:  Recent Labs  02/15/16 0413 02/15/16 0656 02/15/16 0921 02/16/16 0506 02/17/16 0436  HGB 7.5*  --   --  8.1* 8.2*  HCT 23.0*  --   --  25.1* 25.8*  PLT 52*  --   --  45* 48*  LABPROT >90.0* 46.4*  --  44.0* 34.2*  INR >10.00* 4.81*  --  4.51* 3.29  CREATININE  --   --  1.43*  --   --     Estimated Creatinine Clearance: 38 mL/min (by C-G formula based on SCr of 1.43 mg/dL (H)).  Assessment: 65 y.o. female with mechanical MVR (04/08/1991) and TAVR (09/28/14) on warfarin PTA and admitted with supratherapeutic INR. Pharmacy consulted to manage Coumadin which was held until INR <3.5 for EGD today.   INR down to 3.29 today which is in therapeutic range. There was discussion about paracentesis on progression rounds. Spoke with Theodore Demarkhonda Barrett who will discuss  case with Dr. Mayford Knifeurner and let pharmacy know about continuing Coumadin vs holding Coumadin and bridging with IV heparin when INR <2.5. Ok to let INR drift down with low Hb and bleeding on admit.  Goal of Therapy:  INR 2.5-3.5 Monitor platelets by anticoagulation protocol: Yes   Plan:  Follow-up plan for anticoagulation Daily INR, CBC Monitor for S/Sx of bleeding   Thank you for allowing pharmacy to be part of this patients care team.  Loura BackJennifer [place]room[place], PharmD, BCPS Clinical Pharmacist Phone for today [phone] Main pharmacy - [phone] 02/17/2016 3:36 PM   Addendum: Sherron MondaySpoke with Theodore Demarkhonda Barrett again - she would like tight control of INR with goal still 2.5-3.5. Paged Dr. Mayford Knifeurner x2 to clarify if need to hold warfarin for possible paracentesis but did not get a call back.  INR appears to be trending down at good rate, 3.29 << 4.51 - will give warfarin 2.5 mg PO tonight. Daily INR  Loura BackJennifer [place], PharmD, BCPS Clinical Pharmacist 02/17/2016 4:56 PM

## 2016-02-18 DIAGNOSIS — K769 Liver disease, unspecified: Secondary | ICD-10-CM

## 2016-02-18 DIAGNOSIS — D696 Thrombocytopenia, unspecified: Secondary | ICD-10-CM

## 2016-02-18 DIAGNOSIS — Z515 Encounter for palliative care: Secondary | ICD-10-CM

## 2016-02-18 DIAGNOSIS — Z66 Do not resuscitate: Secondary | ICD-10-CM

## 2016-02-18 LAB — PROTIME-INR
INR: 2.87
PROTHROMBIN TIME: 30.7 s — AB (ref 11.4–15.2)

## 2016-02-18 LAB — CBC
HCT: 24.1 % — ABNORMAL LOW (ref 36.0–46.0)
Hemoglobin: 7.6 g/dL — ABNORMAL LOW (ref 12.0–15.0)
MCH: 30.3 pg (ref 26.0–34.0)
MCHC: 31.5 g/dL (ref 30.0–36.0)
MCV: 96 fL (ref 78.0–100.0)
PLATELETS: 46 10*3/uL — AB (ref 150–400)
RBC: 2.51 MIL/uL — AB (ref 3.87–5.11)
RDW: 19.8 % — AB (ref 11.5–15.5)
WBC: 4.1 10*3/uL (ref 4.0–10.5)

## 2016-02-18 MED ORDER — TORSEMIDE 20 MG PO TABS: 20.0000 mg | ORAL_TABLET | Freq: Every day | ORAL | Status: DC

## 2016-02-18 MED ORDER — WARFARIN SODIUM 5 MG PO TABS: 2.5000 mg | ORAL_TABLET | Freq: Every day | ORAL | Status: AC

## 2016-02-18 MED ORDER — WARFARIN SODIUM 2.5 MG PO TABS
2.5000 mg | ORAL_TABLET | Freq: Once | ORAL | Status: AC
  Administered 2016-02-18: 2.5 mg via ORAL
  Filled 2016-02-18: qty 1

## 2016-02-18 NOTE — Progress Notes (Signed)
Jamie DessIrene C Howell 8:50 AM  Subjective: Patient doing fine without signs of bleeding and no GI complaints and we rediscussed her endoscopy findings and her case discussed with my partner Dr. Leonard SchwartzB and her hospital computer chart reviewed and she is eating regular food  Objective: Vital signs stable afebrile no acute distress abdomen is soft nontender hemoglobin slight decrease INR okay  Assessment: Multiple medical problems including probable Jamie Booneash cirrhosis and varices  Plan: Possibly her liver function will keep her INR in the therapeutic range and not even need medicine and hopefully she will not need any other blood thinner like aspirin with Coumadin and if she is to restart it and we are happy to see back as needed and Dr. b happy to see back in follow-up if further GI status assistance is needed  [facility] CenterMAGOD,Oreste Majeed E  Pager [phone] After 5PM or if no answer call [phone]

## 2016-02-18 NOTE — Progress Notes (Signed)
Patient Name: Jamie Howell Date of Encounter: 02/18/2016  Primary Cardiologist: Dr. 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 Further drop in H/H overnight, now 7.6/24. INR now down to 2.87. Appreciate GI and palliative care recommendations, wants to remain FULL CODE for now. Wants to go home with hospice care. Morphine started per palliative care service.  Inpatient Medications    Scheduled Meds: . sodium chloride   Intravenous Once  . amiodarone  200 mg Oral Daily  . calcium carbonate   Oral Q lunch  . cyclobenzaprine  5 mg Oral QHS  . ferrous sulfate  325 mg Oral Q breakfast  . levothyroxine  150 mcg Oral QAC breakfast  . torsemide  20 mg Oral Daily  . Warfarin - Pharmacist Dosing Inpatient   Does not apply q1800   Continuous Infusions:  PRN Meds: morphine CONCENTRATE, ondansetron **OR** ondansetron (ZOFRAN) IV, ondansetron (ZOFRAN) IV   Vital Signs    Vitals:   02/17/16 1418 02/17/16 2110 02/18/16 0421 02/18/16 0430  BP: 124/62 106/64 (!) 84/45 92/64  Pulse: 70 71 79   Resp: 17 18 20    Temp: (!) 96.3 F (35.7 C) 98.3 F (36.8 C) 98.1 F (36.7 C)   TempSrc: Axillary Oral Oral   SpO2: 95% 99% 98%   Weight:   193 lb 8 oz (87.8 kg)   Height:        Intake/Output Summary (Last 24 hours) at 02/18/16 0902 Last data filed at 02/18/16 0648  Gross per 24 hour  Intake              580 ml  Output             2000 ml  Net            -1420 ml   Filed Weights   02/15/16 0624 02/16/16 0405 02/18/16  0421  Weight: 194 lb 11.2 oz (88.3 kg) 195 lb 12.8 oz (88.8 kg) 193 lb 8 oz (87.8 kg)    Physical Exam   GEN: Ill appearing female in no acute distress.  HEENT: Grossly normal.  Neck: Supple, no JVD, carotid bruits, or masses. Cardiac: RRR, no murmurs, + mechanical valve click, No rubs, or gallops. No clubbing, cyanosis, edema.  Radials/DP/PT 2+ and equal bilaterally.  Respiratory:  Respirations regular and unlabored, clear to auscultation bilaterally. GI: Soft, nontender, nondistended, BS + x 4. MS: no deformity or atrophy. Skin: warm and dry, no rash. Neuro:  Strength and sensation are intact. Psych: AAOx3.  Normal affect.  Labs    CBC  Recent Labs  02/17/16 0436 02/18/16 0408  WBC 4.3 4.1  HGB 8.2* 7.6*  HCT 25.8* 24.1*  MCV 95.9 96.0  PLT 48* 46*   Basic Metabolic Panel  Recent Labs  02/15/16 0921  NA 135  K 4.3  CL 103  CO2 25  GLUCOSE 128*  BUN 21*  CREATININE 1.43*  CALCIUM 8.1*   Liver Function Tests No results for input(s):  AST, ALT, ALKPHOS, BILITOT, PROT, ALBUMIN in the last 72 hours.   Telemetry    NSR- Personally Reviewed  ECG    NSR - Personally Reviewed  Radiology    No results found. Patient Profile     Jamie Howell a 65 y.o.femalewith rheumatic heart disease, status post mechanical mitral valve replacement, and status post aortic valve replacement x2, currently abovine valve, on chronic Coumadin with no prior history of GI bleeding.  Presented to the ED on 02/08/16 with weakness and presyncope, INR was >10, Hgb was 7.2 on admission then dropped to 5.7 overnight.    Assessment & Plan    1. Supratherapeutic INR/posthemorrhagic anemia: Appreciate GI consultation, now s/p endoscopy with evidence of portal hypertensive gastropathy with grade II esophageal varices. GI mentioned possible TIPS procedure but patient is too sick and given her advanced RHF, her prognosis is poor with limited life expectancy.  Suspect that INR  continues to increase due to underlying coagulopathy from cirrhosis.  INR now improved off coumadin.  Pharmacy managing.  She will not tolerate BB therapy at this time due to soft BP and actually stopping Toprol today. H/H further declined overnight. Patient is electing home hospice d/t a terminal diagnosis.  2. History of persistent atrial fibrillation: Sinus brady this am on Amio and BB.  Her BP is soft so will stop Toprol - not a candidate for nadalol.   3. Mitral valve replacement: On chronic coumadin, which is being held but INR now coming back down.  Pharmacy managing.   4. Chronic combined systolic and diastolic CHF: She has significant ascites on US secondary to cardiac cirrhosis.  She is very tenuous with fluid status.  She did not tolerate aldactone due to nausea.  Metolazone was stopped due to orthostasis and worsening renal function.   She has end stage right heart failure and we are running out of options.  She is volume overloaded with ascites and RHF but increase diuresis leads to hypotension and dizziness.  She has had decreased appetite and nausea most likely due to ascites and cirrhosis.  I discussed Palliative Care with her yesterday and await consult.  Ultimately her options are very limited at this time.  Will continue to diurese as BP allows.  5. Severe anemia ? Blood loss although iron studies are normal.  She does have heme positive stools but is on iron.  Another consideration would be hemolysis from MVR.  Her indirect bili and LDH are elevated.Retic count elevated as well. Haptoglobin is low all pointing to possible hemolysis from mechanical MVR.  No obvious bleeding noted with varices on endoscopy.   Appreciate palliative care discussion - the patient elected discharge home with hospice services. Will try to arrange that today. Appreciate palliative assistance with this. Contacted case management. Discussed in and out of hospital DNR and she is now agreeable to this after  discussing it further with her son last night.  Chrystie NoseKenneth C. Hilty, MD, [facility] Attending Cardiologist [facility]  Chrystie NoseKenneth C Hilty, MD  02/18/2016, 9:02 AM

## 2016-02-18 NOTE — Care Management Note (Signed)
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   Chronic combined systolic and diastolic CHF (congestive heart failure) (HCC)   Pulmonary HTN   Thrombocytopenia (HCC)   CKD (chronic kidney disease) stage 3, GFR 30-59 ml/min   Cardiac cirrhosis   Long term current use of amiodarone   Supratherapeutic INR   Anemia Action/Plan: Discharge planning Expected Discharge Date:                  Expected Discharge Plan:  Home w Hospice Care  In-House Referral:  NA  Discharge planning Services  CM Consult  Post Acute Care Choice:  Durable Medical Equipment, Hospice Choice offered to:  Patient  DME Arranged:  Shower stool, Walker rolling with seat, 3-N-1, Oxygen, Suction, Hospital bed DME Agency:     HH Arranged:    [facility] Agency:  Other - See comment  Status of Service:  Completed, signed off  If discussed at [place] of Stay Meetings, dates discussed:    Additional Comments: CM received call from MD requesting home hospice arrangements. CM met with pt in room to offer choice of home hospice agency.  Pt chooses [facility] to render services.  Referral called to [facility] and Charisse Klinefelter accepts referral and will arrange for hospital bed, shower stool, bedside commode, home oxygen, suction apparatus. Pt states her son, Ludwig Clarks [phone]) will be able to receive equipment at home. B[facility]i of [facility] is en route to finalize arrangements for discharge today.  No other CM needs were communicated. Dellie Catholic, RN 02/18/2016, 9:36 AM

## 2016-02-18 NOTE — Progress Notes (Signed)
ANTICOAGULATION CONSULT NOTE - Follow Up Consult  Pharmacy Consult for Coumadin Indication: mechanical valves (on Coumadin PTA)  Allergies  Allergen Reactions  . Adhesive [Tape] Itching    Adhesive tape and backing (pls use elastic bandages with no adhesive)  . Spironolactone Other (See Comments)    Severe dizziness and nausea  . Penicillins Rash    Has patient had a PCN reaction causing immediate rash, facial/tongue/throat swelling, SOB or lightheadedness with hypotension: Yes Has patient had a PCN reaction causing severe rash involving mucus membranes or skin necrosis: No Has patient had a PCN reaction that required hospitalization No Has patient had a PCN reaction occurring within the last 10 years: No If all of the above answers are "NO", then may proceed with Cephalosporin use.   . Sulfa Antibiotics Rash    Patient Measurements: Height: 4\' 11"  (149.9 cm) Weight: 193 lb 8 oz (87.8 kg) IBW/kg (Calculated) : 43.2  Heparin Dosing Weight: 65kg (based on IBW of 45.5 kgs)  Vital Signs: Temp: 98.1 F (36.7 C) (11/18 0421) Temp Source: Oral (11/18 0421) BP: 102/76 (11/18 0903) Pulse Rate: 82 (11/18 0903)  Labs:  Recent Labs  02/16/16 0506 02/17/16 0436 02/18/16 0408  HGB 8.1* 8.2* 7.6*  HCT 25.1* 25.8* 24.1*  PLT 45* 48* 46*  LABPROT 44.0* 34.2* 30.7*  INR 4.51* 3.29 2.87    Estimated Creatinine Clearance: 37.8 mL/min (by C-G formula based on SCr of 1.43 mg/dL (H)).  Assessment: 65 y.o. female with mechanical MVR (04/08/1991) and TAVR (09/28/14) on warfarin PTA and admitted with supratherapeutic INR. Pharmacy consulted to manage Coumadin which was held until INR <3.5 in anticipation of EGD this admission  INR down to 2.87  today which is in therapeutic range. Tight control still desired- INR with goal 2.5-3.5.  INR trending down, got 2.5 mg x 1 last night. Will give another 2.5 mg tonight and watch closely.  Per Dr. Rennis GoldenHilty: patient going home with Hospice. No  paracentesis planned. If INR drops below 2.5, no bridging with heparin as care is palliative at this point.   Goal of Therapy:  INR 2.5-3.5 Monitor platelets by anticoagulation protocol: Yes   Plan:  Warfarin 2.5 mg x 1 tonight Daily INR, CBC Monitor for S/Sx of bleeding   Thank you for allowing pharmacy to be part of this patients care team.  Allena Katzaroline E Jep Dyas, Pharm.D. PGY1 Pharmacy Resident 11/18/201710:40 AM Pager [phone]

## 2016-02-18 NOTE — Discharge Instructions (Signed)
Take Coumadin 2.5 mg Once daily  You need your INR checked on 02/20/16 - Hospice will check this for you

## 2016-02-18 NOTE — Discharge Summary (Signed)
Discharge Summary    Patient ID: Jamie Howell,  MRN: [id number], DOB/AGE: Mar 17, 1951 65 y.o.  Admit date: 02/08/2016 Discharge date: 02/18/2016  Primary Care Provider: Merri Brunette DAVIDSON Cardiologist:Dr. Armanda Magic  Electrophysiologist: Dr. Hillis Range TAVR: Dr. Tonny Bollman CHF: Dr. Arvilla Meres  Nephrology: Dr. 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 Adhesive [Tape] Itching    Adhesive tape and backing (pls use elastic bandages with no adhesive)  . Spironolactone Other (See Comments)    Severe dizziness and nausea  . Penicillins Rash    Has patient had a PCN reaction causing immediate rash, facial/tongue/throat swelling, SOB or lightheadedness with hypotension: Yes Has patient had a PCN reaction causing severe rash involving mucus membranes or skin necrosis: No Has patient had a PCN reaction that required hospitalization No Has patient had a PCN reaction occurring within the last 10 years: No If all of the above answers are "NO", then may proceed with Cephalosporin use.   . Sulfa Antibiotics Rash    Diagnostic Studies/Procedures    EGD 02/17/16 - Grade II esophageal varices. - Portal hypertensive gastropathy. - Normal examined duodenum. - No specimens collected.  _____________   History of Present Illness     Rheumatic heart disease status post remote prior  mechanical MVR and subsequent TAVR in 6/16, warfarin anticoagulation, cirrhosis (probably cardiac cirrhosis), chronic thrombocytopenia, anemia, atrial flutter status post DCCV 11/16, HTN, combined systolic and diastolic HF. She was seen several times in 9/17 for recurrent CHF.  She was diuresed and referred to Nephrology and AHF Clinic. She saw Dr. Arvilla Meres in 10/17.  He felt she was suffering the effects of long-standing rheumatic heart disease with pulmonary HTN and R sided HF with assoc cardiac cirrhosis.  Volume management was felt to be key and he did add Spironolactone.  She was given a Rx for compression hose.  RHC could be considered in the future.  Spironolactone was discontinued secondary to nausea. She presented to the office on 02/08/16 with nausea, dizziness and near syncope. She had previously been taken off of metolazone. She took this by mistake several days earlier. She had one dark stool on the morning of admission. She was felt to be dehydrated. Her blood pressure dropped from 137/67 lying to 78/54 standing. She was initially admitted for observation with an eye towards gentle hydration and early discharge.  Hospital Course     Consultants: GI - Dr. Matthias Hughs   1. Dehydration - She was initially admitted with a working diagnosis of dehydration from over diuresis. She was also noted to be severely anemic.   Ultimately, home hospice was recommended- see below.  2. Anemia - Patient's hemoglobin was noted be markedly depressed upon admission. Initially it was 7.2. Hemoglobin did drop to 5.7. Patient received transfusion with PRBCs x 2 and Hgb did come up to 7.8.  She was noted to  be Heme + and GI was consulted.  She was set up for EGD but this was delayed several times due to supra-therapeutic INR.  EGD was performed 02/17/16. She was noted to have grade 2 esophageal varices and portal hypertensive gastropathy. Banding was not performed as she was not actively bleeding.  Also, her prior  pattern of bleeding was more consistent with portal gastropathy than variceal bleeding. TIPS could be considered in the future. However, the patient is significantly ill with comorbid conditions and advanced right heart failure. Her prognosis is felt to be poor.  Therefore, this procedure will not likely be done.  3. Advanced R sided heart failure - She was gently hydrated in addition to receiving PRBCs for anemia. Her diuretics were held. However, she developed volume overload with significant ascites noted on abdominal US.  Given her tenuous response to diuretics in the past, this complicated her condition further. Case was discussed with Dr. Gala Romney in regards to right heart catheterization and possible PICC line with milrinone. After discussion with Dr. Gala Romney, it was felt that this would only provide her a few additional months of life and significantly increase her risk for bacteremia and possible endocarditis of her MVR. Torsemide was continued. Long discussion was held with the patient and her family. It was decided to proceed with palliative care.  Patient was seen by Palliative Care and home hospice was recommended. Patient elected to be DO NOT RESUSCITATE.  4. Rheumatic heart disease - s/p mechanical SJ MVR and subsequent TAVR in 2016.  Valves functioning appropriately at [facility] in 4/17.  Her INR was greater than 10 upon admission. She was given vitamin K to correct her INR. Her INR decreased to 2.2 and she was placed back on heparin and Coumadin.  However, her INR again increased to > 10.  Her cirrhosis is likely impacting her anticoagulation.  She was closely monitored by pharmacy.   INR today is 2.87.  She will go home on Coumadin 2.5 mg QD and will need an INR 02/20/16.  5. AFib/Flutter - s/p DCCV.  She has maintained NSR on Amiodarone.  Her BP was felt to be too low and her beta blocker was discontinued this admission.  She will need close FU on her INR as noted.    6. CKD - She is  followed by Nephrology as an outpatient.   As noted her creatinine was 1.85 upon admission. This improved to 1.43 on 02/15/16 with hydration.  7. Cirrhosis - Probable cardiac cirrhosis.  She was followed by GI this admission and will FU as an outpatient as indicated.    8. Severe Pulmonary HTN - 2/2 Rheumatic Heart Disease.  As noted, she has advanced, end stage R sided HF.  She is going home with Hospice and is DNR.  _____________  Discharge Vitals Blood pressure (!) 111/57, pulse 74, temperature 98.2 F (36.8 C), temperature source Oral, resp. rate 14, height 4\' 11"  (1.499 m), weight 193 lb 8 oz (87.8 kg), SpO2 93 %.  Filed Weights   02/15/16 0624 02/16/16 0405 02/18/16 0421  Weight: 194 lb 11.2 oz (88.3 kg) 195 lb 12.8 oz (88.8 kg) 193 lb 8 oz (87.8 kg)    Labs & Radiologic Studies    CBC  Recent Labs  02/17/16 0436 02/18/16 0408  WBC 4.3 4.1  HGB 8.2* 7.6*  HCT 25.8* 24.1*  MCV 95.9 96.0  PLT 48* 46*   Basic Metabolic Panel No results for input(s): NA, K, CL, CO2, GLUCOSE, BUN,  CREATININE, CALCIUM, MG, PHOS in the last 72 hours. Liver Function Tests No results for input(s): AST, ALT, ALKPHOS, BILITOT, PROT, ALBUMIN in the last 72 hours. No results for input(s): LIPASE, AMYLASE in the last 72 hours. Cardiac Enzymes No results for input(s): CKTOTAL, CKMB, CKMBINDEX, TROPONINI in the last 72 hours. BNP Invalid input(s): POCBNP D-Dimer No results for input(s): DDIMER in the last 72 hours. Hemoglobin A1C No results for input(s): HGBA1C in the last 72 hours. Fasting Lipid Panel No results for input(s): CHOL, HDL, LDLCALC, TRIG, CHOLHDL, LDLDIRECT in the last 72 hours. Thyroid Function Tests No results for input(s): TSH, T4TOTAL, T3FREE, THYROIDAB in the last 72 hours.  Invalid input(s): FREET3 _____________   [place] Abdomen Limited  Result Date: 02/14/2016 IMPRESSION: Significant ascites, preventing performance of an adequate elastography exam. May reconsider  performing elastography following paracentesis, or at a delayed date following discharge if ascites results. Electronically Signed   By: Ulyses SouthwardMark  Boles M.D.   On: 02/14/2016 16:35    Disposition   Pt is being discharged home today in good condition.  Follow-up Plans & Appointments    Follow-up Information    Armanda Magicraci Turner, MD Follow up.   Specialty:  Cardiology Why:  we will arrange for follow up with one of Dr. Norris Crossurner's team members and contact you. Contact information: [address] [place] [address] [phone]        [facility] Follow up.   Specialty:  Hospice Services Why:  home hospice and palliative services Contact information: [address] [place] [address] [phone]          Discharge Instructions    (HEART FAILURE PATIENTS) Call MD:  Anytime you have any of the following symptoms: 1) 3 pound weight gain in 24 hours or 5 pounds in 1 week 2) shortness of breath, with or without a dry hacking cough 3) swelling in the hands, feet or stomach 4) if you have to sleep on extra pillows at night in order to breathe.    Complete by:  As directed    Diet - low sodium heart healthy    Complete by:  As directed    Increase activity slowly    Complete by:  As directed    No wound care    Complete by:  As directed       Discharge Medications   Current Discharge Medication List    CONTINUE these medications which have CHANGED   Details  torsemide (DEMADEX) 20 MG tablet Take 1 tablet (20 mg total) by mouth daily.    warfarin (COUMADIN) 5 MG tablet Take 0.5 tablets (2.5 mg total) by mouth at bedtime. Or as directed.      CONTINUE these medications which have NOT CHANGED   Details  acetaminophen (TYLENOL) 325 MG tablet Take 325-650 mg by mouth every 6 (six) hours as needed for headache.    amiodarone (PACERONE) 200 MG tablet Take 1 tablet (200 mg total) by mouth daily. Qty: 90 tablet, Refills: 2    Calcium  Carb-Cholecalciferol (CALCIUM 1000 + D PO) Take 1,000 mg by mouth daily with lunch.     ferrous sulfate 325 (65 FE) MG tablet TAKE ONE TABLET BY MOUTH ONCE DAILY WITH BREAKFAST Qty: 30 tablet, Refills: 6    levothyroxine (SYNTHROID, LEVOTHROID) 150 MCG tablet Take 150 mcg by mouth daily before breakfast.     potassium chloride SA (K-DUR,KLOR-CON) 20 MEQ tablet Take 20 mEq by mouth 2 (two) times  daily. MORNING AND LUNCHTIME      STOP taking these medications     aspirin EC 81 MG tablet      metoprolol succinate (TOPROL-XL) 50 MG 24 hr tablet      cyclobenzaprine (FLEXERIL) 10 MG tablet           Outstanding Labs/Studies   INR needs to be drawn on 02/20/16 for management of Coumadin.   Duration of Discharge Encounter   Greater than 30 minutes including physician time.  Signed, Tereso NewcomerScott Weaver, PA-C  02/18/2016, 5:36 PM

## 2016-02-20 ENCOUNTER — Encounter (HOSPITAL_COMMUNITY): Payer: Self-pay | Admitting: Gastroenterology

## 2016-02-22 ENCOUNTER — Encounter: Payer: Self-pay | Admitting: Physician Assistant

## 2016-03-05 ENCOUNTER — Encounter: Payer: Self-pay | Admitting: Physician Assistant

## 2016-03-05 ENCOUNTER — Ambulatory Visit (INDEPENDENT_AMBULATORY_CARE_PROVIDER_SITE_OTHER): Payer: PPO | Admitting: Physician Assistant

## 2016-03-05 VITALS — BP 118/78 | HR 81 | Ht 59.0 in | Wt 200.8 lb

## 2016-03-05 DIAGNOSIS — I4819 Other persistent atrial fibrillation: Secondary | ICD-10-CM

## 2016-03-05 DIAGNOSIS — I481 Persistent atrial fibrillation: Secondary | ICD-10-CM | POA: Diagnosis not present

## 2016-03-05 DIAGNOSIS — R791 Abnormal coagulation profile: Secondary | ICD-10-CM

## 2016-03-05 DIAGNOSIS — I5043 Acute on chronic combined systolic (congestive) and diastolic (congestive) heart failure: Secondary | ICD-10-CM

## 2016-03-05 DIAGNOSIS — N183 Chronic kidney disease, stage 3 unspecified: Secondary | ICD-10-CM

## 2016-03-05 DIAGNOSIS — I441 Atrioventricular block, second degree: Secondary | ICD-10-CM | POA: Diagnosis not present

## 2016-03-05 DIAGNOSIS — I099 Rheumatic heart disease, unspecified: Secondary | ICD-10-CM

## 2016-03-05 LAB — BASIC METABOLIC PANEL
BUN: 17 mg/dL (ref 7–25)
CALCIUM: 7.9 mg/dL — AB (ref 8.6–10.4)
CO2: 33 mmol/L — ABNORMAL HIGH (ref 20–31)
Chloride: 102 mmol/L (ref 98–110)
Creat: 1.72 mg/dL — ABNORMAL HIGH (ref 0.50–0.99)
GLUCOSE: 122 mg/dL — AB (ref 65–99)
POTASSIUM: 3.5 mmol/L (ref 3.5–5.3)
Sodium: 142 mmol/L (ref 135–146)

## 2016-03-05 MED ORDER — TORSEMIDE 20 MG PO TABS: 40.0000 mg | ORAL_TABLET | Freq: Every day | ORAL | 3 refills | Status: AC

## 2016-03-05 MED ORDER — AMIODARONE HCL 100 MG PO TABS: 100.0000 mg | ORAL_TABLET | Freq: Every day | ORAL | 3 refills | Status: AC

## 2016-03-05 NOTE — Patient Instructions (Addendum)
Medication Instructions:  1. INCREASE TORSEMIDE TO 40 MG DAILY; NEW RX HAS BEEN SENT IN FOR THE NEW DOSE  2. DECREASE AMIODARONE TO 100 MG DAILY; NEW RX HAS BEEN SENT IN FOR THE NEW DOSE  Labwork: 1. TODAY BMET, CBC   2. YOU HAVE BEEN GIVEN AN RX TO GIVE TO THE HOSPICE NURSE FOR LAB WORK TO BE DONE IN 2 WEEKS WITH THE RESULTS TO FAXED TO Herma CarsonMICHELLE LENZE, PAC [phone]  Testing/Procedures: NONE  Follow-Up: 1. MICHELLE LENZE, PAC 3-4 WEEKS  2. YOU WILL NEED TO SEE DR. Mayford KnifeURNER IN 3-4 MONTHS  Any Other Special Instructions Will Be Listed Below (If Applicable).     If you need a refill on your cardiac medications before your next appointment, please call your pharmacy.

## 2016-03-05 NOTE — Progress Notes (Signed)
Cardiology Office Note    Date:  03/05/2016   ID:  Jamie Howell, DOB 06-07-1950, MRN [id number]  PCP:  Horatio Pel, MD  Cardiologist: Dr. Radford Pax EPS: Dr. Rayann Heman TAVR:Dr. Burt Knack CHG: Dr. Haroldine Laws  Chief Complaint  Patient presents with  . Follow-up    History of Present Illness:  Jamie Howell is a 65 y.o. 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 She was seen several times in 9/17 for recurrent CHF.  She was diuresed and referred to Nephrology [facility]. She saw Dr. Glori Bickers in 10/17.  He felt she was suffering the effects of long-standing rheumatic heart disease with pulmonary HTN and R sided HF with assoc cardiac cirrhosis.  Volume management was felt to be key and he did add Spironolactone.  She was given a Rx for compression hose.  RHC could be considered in the future.  Spironolactone was discontinued secondary to nausea. She presented to the office on 02/08/16 with nausea, dizziness and near syncope. She had previously been taken off of metolazone. She took this by mistake several days earlier. She had one dark stool on the morning of admission. She was felt to be dehydrated. Her blood pressure dropped from 137/67 lying to 78/54 standing.    Patient was found to be severely anemic with a hemoglobin that dropped to 5.7 and an INR over 10. She was transfused an EGD on 02/17/16 showed grade 2 esophageal varices and portal hypertensive gastropathy. She then developed some volume overload with significant ascites on abdominal ultrasound. Right heart catheterization and possible PICC line was considered but felt that this would only provide her a few additional months of  life is significantly increase her risk for bacteremia. Torsemide was continued on palliative care was recommended. She underwent cardioversion for atrial fibrillation to normal sinus rhythm 02/01/15.  Patient comes in today accompanied by her friend. She says her swelling has worsened again when she is more short of breath. She also says her INR was up to 6 last week but came down to 4 and 2.1. It is being drawn by a hospice group out of [place]. Denies any black stools or bleeding. She is wheezing. Very weak.    Past Medical History:  Diagnosis Date  . Anemia   . Aortic valve stenosis    moderate aortic valve stenosis with moderate aortic insufficiency, stable MVR, trivial PR, mild TR, severe LAE, grade II-III diastolic dysfunction,mild pulmonary HTN , EF 50% by echo 12/2012  . Atypical atrial flutter (HCC)    s/p DCCV  . Chronic combined systolic (congestive) and diastolic (congestive) heart failure   . History of blood transfusion 09/2014   "during hospitalization"  . HTN (hypertension)   . Hypothyroidism   . 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 Obesity (BMI 30-39.9) 07/08/2014  . Paroxysmal atrial fibrillation (HCC)   . Paroxysmal atrial tachycardia (HCC)    s/p DCCV  . Pneumonia ~ 2014; 05/2014  . Portal hypertension with esophageal varices (HCC) 07/14/2014  . Pulmonary HTN    severe by echo 07/2015  . Rheumatic fever   . S/P mitral valve replacement with metallic valve 05/10/7865   31 mm St Jude bileaflet mechanical valve placed for  rheumatic mitral stenosis  . 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 Splenomegaly 07/14/2014  . Subdural hematoma (HCC)    spontaneous  . Temporary low platelet count ([place])   . Thyrotoxicosis     without mention of goiter or other cause, without mention of thyrotoxic crisis or storm    Past  Surgical History:  Procedure Laterality Date  . APPLICATION OF WOUND VAC Left 10/20/2014   Procedure: APPLICATION OF WOUND VAC;  Surgeon: Rexene Alberts, MD;  Location: [facility];  Service: Thoracic;  Laterality: Left;  . BRAIN SURGERY    . CARDIAC CATHETERIZATION     06/2014  . CARDIAC VALVE REPLACEMENT    . CARDIOVERSION N/A 02/01/2015   Procedure: CARDIOVERSION;  Surgeon: Sueanne Margarita, MD;  Location: [facility];  Service: Cardiovascular;  Laterality: N/A;  . ESOPHAGOGASTRODUODENOSCOPY (EGD) WITH PROPOFOL N/A 02/17/2016   Procedure: ESOPHAGOGASTRODUODENOSCOPY (EGD) WITH PROPOFOL;  Surgeon: Ronald Lobo, MD;  Location: [facility] ENDOSCOPY;  Service: Endoscopy;  Laterality: N/A;  . LAPAROSCOPIC CHOLECYSTECTOMY  05-30-90  . LEFT AND RIGHT HEART CATHETERIZATION WITH CORONARY ANGIOGRAM N/A 06/28/2014   Procedure: LEFT AND RIGHT HEART CATHETERIZATION WITH CORONARY ANGIOGRAM;  Surgeon: Sherren Mocha, MD; Normal coronaries, MVR OK, severe AS, elevated R heart pressures    . MITRAL VALVE REPLACEMENT  04/08/1991   52m St Jude mechanical valve - Dr WRedmond Pulling . MULTIPLE EXTRACTIONS WITH ALVEOLOPLASTY N/A 08/05/2014   Procedure: Extraction of tooth #'s 36,7,3,4,1,9,37,90,24,09,73,53,29,92,42,68,34,19,62 28,29, and 32 with alveoloplasty;  Surgeon: RLenn Cal DDS;  [facility]  Service: Oral Surgery;  Laterality: N/A;  . SUBDURAL HEMATOMA EVACUATION VIA CRANIOTOMY  2000   Dr PTrenton Gammon . TEE WITHOUT CARDIOVERSION N/A 09/28/2014   Procedure: TRANSESOPHAGEAL ECHOCARDIOGRAM (TEE);  Surgeon: MSherren Mocha MD;  Location: [facility]  Service: Open Heart Surgery;  Laterality: N/A;  . TONSILLECTOMY    . TOTAL ABDOMINAL HYSTERECTOMY  07-23-1994  . TRANSCATHETER AORTIC VALVE REPLACEMENT, TRANSFEMORAL N/A 09/28/2014   Procedure: TRANSCATHETER AORTIC VALVE REPLACEMENT, TRANSFEMORAL;  Surgeon: MSherren Mocha MD;  [facility]  Service: Open Heart Surgery;  Laterality: N/A;  . WOUND EXPLORATION Left 10/20/2014    Procedure: WOUND EXPLORATION;  Surgeon: CRexene Alberts MD;  [facility];  Service: Thoracic;  Laterality: Left;    Current Medications: Outpatient Medications Prior to Visit  Medication Sig Dispense Refill  . acetaminophen (TYLENOL) 325 MG tablet Take 325-650 mg by mouth every 6 (six) hours as needed for headache.    . Calcium Carb-Cholecalciferol (CALCIUM 1000 + D PO) Take 1,000 mg by mouth daily with lunch.     . levothyroxine (SYNTHROID, LEVOTHROID) 150 MCG tablet Take 150 mcg by mouth daily before breakfast.     . potassium chloride SA (K-DUR,KLOR-CON) 20 MEQ tablet Take 20 mEq by mouth 2 (two) times daily. MORNING AND LUNCHTIME    . warfarin (COUMADIN) 5 MG tablet Take 0.5 tablets (2.5 mg total) by mouth at bedtime. Or as directed.    .Marland Kitchenamiodarone (PACERONE) 200 MG tablet Take 1 tablet (200 mg total) by mouth daily. 90 tablet 2  . torsemide (DEMADEX) 20 MG tablet Take 1 tablet (20 mg total) by mouth daily.    . ferrous sulfate 325 (65 FE) MG tablet TAKE ONE TABLET BY MOUTH ONCE DAILY WITH BREAKFAST (Patient taking differently: Take 325 mg by mouth once a day with breakfast) 30 tablet 6  No facility-administered medications prior to visit.      Allergies:   Adhesive [tape]; Spironolactone; Penicillins; and Sulfa antibiotics   Social History   Social History  . Marital status: Single    Spouse name: N/A  . Number of children: 2  . Years of education: N/A   Occupational History  . circulation desk clerk    Social History Main Topics  . Smoking status: Never Smoker  . Smokeless tobacco: Never Used  . Alcohol use No  . Drug use: No  . Sexual activity: Not Currently   Other Topics Concern  . None   Social History Narrative   Pt lives in [place] with son.  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     Family History:  The patient's family history includes CAD in her father and mother; Cirrhosis in her brother; Heart attack in her sister; Hypertension in her  father.   ROS:   Please see the history of present illness.    Review of Systems  Constitution: Positive for decreased appetite, weakness and weight gain.  HENT: Negative.   Eyes: Negative.   Cardiovascular: Positive for dyspnea on exertion and leg swelling.  Respiratory: Negative.   Hematologic/Lymphatic: Bruises/bleeds easily.  Musculoskeletal: Negative.  Negative for joint pain.  Gastrointestinal: Positive for nausea.  Genitourinary: Negative.    All other systems reviewed and are negative.   PHYSICAL EXAM:   VS:  BP 118/78   Pulse 81   Ht _0  (1.499 m)   Wt 200 lb 12.8 oz (91.1 kg)   SpO2 99%   BMI 40.56 kg/m   Physical Exam  GEN: Well nourished, well developed, in no acute distress  Neck: Increased JVD, carotid bruits, or masses Cardiac:RRR; valvular clicks, no rubs, or gallops  Respiratory:  Wheezing anterior, decreased breath sounds with rales at the bases  GI: soft, nontender, nondistended, + BS Ext: Patient wearing compression hose but 2-3+ edema up past her thighs MS: no deformity or atrophy  Skin: warm and dry, no rash Psych: euthymic mood, full affect  Wt Readings from Last 3 Encounters:  03/05/16 200 lb 12.8 oz (91.1 kg)  02/18/16 193 lb 8 oz (87.8 kg)  02/08/16 179 lb 1.9 oz (81.2 kg)      Studies/Labs Reviewed:   EKG:  EKG is ordered today.  The ekg ordered today demonstrates Wenkebach at 81 bpm  Recent Labs: 12/02/2015: TSH 1.96 02/08/2016: Magnesium 1.8 02/14/2016: ALT 29 02/15/2016: BUN 21; Creatinine, Ser 1.43; Potassium 4.3; Sodium 135 02/16/2016: B Natriuretic Peptide 586.4 02/18/2016: Hemoglobin 7.6; Platelets 46   Lipid Panel No results found for: CHOL, TRIG, HDL, CHOLHDL, VLDL, LDLCALC, LDLDIRECT  Additional studies/ records that were reviewed today include:  2D echo 4/717  Study Conclusions   - Left ventricle: LVEF is approximately 45% with septal, posterior   and inferior hypokinesis. The cavity size was normal. Wall    thickness was normal. - Aortic valve: AV prosthesis is difficult to see well Peak nad   mean gradients through the valve are 18 and 11 mm Hg   respectively. - Mitral valve: MV prosthesiss is difficult to see well Peak and   mean gradients through the valve arer 12 and 4 mm Hg   respectively. There was mild regurgitation. - Left atrium: The atrium was severely dilated. - Right ventricle: The cavity size was mildly dilated. - Right atrium: The atrium was severely dilated. - Tricuspid valve: There was moderate regurgitation. - Pulmonary arteries: PA peak pressure:  71 mm Hg (S).      ASSESSMENT:    1. Persistent atrial fibrillation ([place])   2. Second degree heart block   3. Acute on chronic combined systolic and diastolic CHF (congestive heart failure) ([place])   4. CKD (chronic kidney disease) stage 3, GFR 30-59 ml/min   5. Supratherapeutic INR   6. Rheumatic heart disease s/p Mechanical MVR and TAVR      PLAN:  In order of problems listed above:  Persistent atrial fibrillation status post DC CV to normal sinus rhythm in the hospital. Patient is now in a Wenckebach rhythm. EKG reviewed with Dr. Tamala Julian who recommends decreasing her amiodarone to 100 mg daily.  Second-degree heart block decrease amiodarone to 100 mg daily  Acute on chronic diastolic heart failure will increase Demadex to 40 mg daily, continue potassium, check CBC and be met today. Patient is to weigh daily and call when she loses the 7 pounds so we can decrease her diuretics. Repeat in 2 weeks to be drawn by hospice. Follow-up with me in 3 weeks, Dr. Radford Pax in 3-4 months. Patient is being followed by hospice and palliative care  Chronic kidney disease will check renal function today  Supra therapeutic INR and recent GI bleed. Will check CBC. Rheumatic heart disease with mechanical MVR and TAVR last yr.    Recent GI bleed with elevated INR. INR was up last week to 6.0. Being managed by hospice.     Medication  Adjustments/Labs and Tests Ordered: Current medicines are reviewed at length with the patient today.  Concerns regarding medicines are outlined above.  Medication changes, Labs and Tests ordered today are listed in the Patient Instructions below. Patient Instructions  Medication Instructions:  1. INCREASE TORSEMIDE TO 40 MG DAILY; NEW RX HAS BEEN SENT IN FOR THE NEW DOSE  2. DECREASE AMIODARONE TO 100 MG DAILY; NEW RX HAS BEEN SENT IN FOR THE NEW DOSE  Labwork: 1. TODAY BMET, CBC   2. YOU HAVE BEEN GIVEN AN RX TO GIVE TO THE HOSPICE NURSE FOR LAB WORK TO BE DONE IN 2 WEEKS WITH THE RESULTS TO FAXED TO [facility], [place]  Testing/Procedures: NONE  Follow-Up: 1. MICHELLE Vannesa Abair, PAC 3-4 WEEKS  2. YOU WILL NEED TO SEE DR. Radford Pax IN 3-4 MONTHS  Any Other Special Instructions Will Be Listed Below (If Applicable).     If you need a refill on your cardiac medications before your next appointment, please call your pharmacy.      Sumner Boast, PA-C  03/05/2016 11:31 AM    [facility] [address], [place], [place]  [address] Phone: [phone]; Fax: [phone]

## 2016-03-06 LAB — CBC
HEMATOCRIT: 30.3 % — AB (ref 35.0–45.0)
Hemoglobin: 9.4 g/dL — ABNORMAL LOW (ref 11.7–15.5)
MCH: 29.9 pg (ref 27.0–33.0)
MCHC: 31 g/dL — AB (ref 32.0–36.0)
MCV: 96.5 fL (ref 80.0–100.0)
MPV: 10.4 fL (ref 7.5–12.5)
PLATELETS: 58 10*3/uL — AB (ref 140–400)
RBC: 3.14 MIL/uL — ABNORMAL LOW (ref 3.80–5.10)
RDW: 18.4 % — AB (ref 11.0–15.0)
WBC: 4.1 10*3/uL (ref 3.8–10.8)

## 2016-03-09 ENCOUNTER — Inpatient Hospital Stay (HOSPITAL_COMMUNITY)
Admission: EM | Admit: 2016-03-09 | Discharge: 2016-04-02 | DRG: 291 | Disposition: E | Payer: PPO | Attending: Internal Medicine | Admitting: Internal Medicine

## 2016-03-09 ENCOUNTER — Emergency Department (HOSPITAL_COMMUNITY): Payer: PPO

## 2016-03-09 ENCOUNTER — Encounter (HOSPITAL_COMMUNITY): Payer: Self-pay

## 2016-03-09 DIAGNOSIS — I4510 Unspecified right bundle-branch block: Secondary | ICD-10-CM | POA: Diagnosis present

## 2016-03-09 DIAGNOSIS — I481 Persistent atrial fibrillation: Secondary | ICD-10-CM | POA: Diagnosis not present

## 2016-03-09 DIAGNOSIS — Z8701 Personal history of pneumonia (recurrent): Secondary | ICD-10-CM

## 2016-03-09 DIAGNOSIS — Z7901 Long term (current) use of anticoagulants: Secondary | ICD-10-CM

## 2016-03-09 DIAGNOSIS — I099 Rheumatic heart disease, unspecified: Secondary | ICD-10-CM | POA: Diagnosis present

## 2016-03-09 DIAGNOSIS — K7460 Unspecified cirrhosis of liver: Secondary | ICD-10-CM | POA: Diagnosis present

## 2016-03-09 DIAGNOSIS — I5043 Acute on chronic combined systolic (congestive) and diastolic (congestive) heart failure: Secondary | ICD-10-CM | POA: Diagnosis present

## 2016-03-09 DIAGNOSIS — Z888 Allergy status to other drugs, medicaments and biological substances status: Secondary | ICD-10-CM

## 2016-03-09 DIAGNOSIS — I130 Hypertensive heart and chronic kidney disease with heart failure and stage 1 through stage 4 chronic kidney disease, or unspecified chronic kidney disease: Principal | ICD-10-CM | POA: Diagnosis present

## 2016-03-09 DIAGNOSIS — Z952 Presence of prosthetic heart valve: Secondary | ICD-10-CM

## 2016-03-09 DIAGNOSIS — E669 Obesity, unspecified: Secondary | ICD-10-CM | POA: Diagnosis present

## 2016-03-09 DIAGNOSIS — R791 Abnormal coagulation profile: Secondary | ICD-10-CM | POA: Diagnosis present

## 2016-03-09 DIAGNOSIS — I10 Essential (primary) hypertension: Secondary | ICD-10-CM

## 2016-03-09 DIAGNOSIS — I5042 Chronic combined systolic (congestive) and diastolic (congestive) heart failure: Secondary | ICD-10-CM | POA: Diagnosis present

## 2016-03-09 DIAGNOSIS — Z79899 Other long term (current) drug therapy: Secondary | ICD-10-CM | POA: Diagnosis not present

## 2016-03-09 DIAGNOSIS — Z882 Allergy status to sulfonamides status: Secondary | ICD-10-CM

## 2016-03-09 DIAGNOSIS — E0590 Thyrotoxicosis, unspecified without thyrotoxic crisis or storm: Secondary | ICD-10-CM | POA: Diagnosis present

## 2016-03-09 DIAGNOSIS — R7881 Bacteremia: Secondary | ICD-10-CM | POA: Diagnosis present

## 2016-03-09 DIAGNOSIS — B951 Streptococcus, group B, as the cause of diseases classified elsewhere: Secondary | ICD-10-CM | POA: Diagnosis present

## 2016-03-09 DIAGNOSIS — Z6839 Body mass index (BMI) 39.0-39.9, adult: Secondary | ICD-10-CM | POA: Diagnosis not present

## 2016-03-09 DIAGNOSIS — J9621 Acute and chronic respiratory failure with hypoxia: Secondary | ICD-10-CM | POA: Diagnosis present

## 2016-03-09 DIAGNOSIS — I272 Pulmonary hypertension, unspecified: Secondary | ICD-10-CM | POA: Diagnosis present

## 2016-03-09 DIAGNOSIS — I8510 Secondary esophageal varices without bleeding: Secondary | ICD-10-CM | POA: Diagnosis present

## 2016-03-09 DIAGNOSIS — I4892 Unspecified atrial flutter: Secondary | ICD-10-CM | POA: Diagnosis present

## 2016-03-09 DIAGNOSIS — K766 Portal hypertension: Secondary | ICD-10-CM | POA: Diagnosis present

## 2016-03-09 DIAGNOSIS — I4819 Other persistent atrial fibrillation: Secondary | ICD-10-CM | POA: Diagnosis present

## 2016-03-09 DIAGNOSIS — Z880 Allergy status to penicillin: Secondary | ICD-10-CM

## 2016-03-09 DIAGNOSIS — R0902 Hypoxemia: Secondary | ICD-10-CM

## 2016-03-09 DIAGNOSIS — R000 Tachycardia, unspecified: Secondary | ICD-10-CM | POA: Diagnosis present

## 2016-03-09 DIAGNOSIS — E039 Hypothyroidism, unspecified: Secondary | ICD-10-CM | POA: Diagnosis present

## 2016-03-09 DIAGNOSIS — Z8249 Family history of ischemic heart disease and other diseases of the circulatory system: Secondary | ICD-10-CM

## 2016-03-09 DIAGNOSIS — Z66 Do not resuscitate: Secondary | ICD-10-CM | POA: Diagnosis present

## 2016-03-09 DIAGNOSIS — I959 Hypotension, unspecified: Secondary | ICD-10-CM | POA: Diagnosis present

## 2016-03-09 DIAGNOSIS — N184 Chronic kidney disease, stage 4 (severe): Secondary | ICD-10-CM | POA: Diagnosis present

## 2016-03-09 DIAGNOSIS — I509 Heart failure, unspecified: Secondary | ICD-10-CM | POA: Diagnosis not present

## 2016-03-09 HISTORY — DX: Dyspnea, unspecified: R06.00

## 2016-03-09 HISTORY — DX: Atherosclerotic heart disease of native coronary artery without angina pectoris: I25.10

## 2016-03-09 LAB — CBC WITH DIFFERENTIAL/PLATELET
BASOS ABS: 0 10*3/uL (ref 0.0–0.1)
Basophils Relative: 0 %
EOS ABS: 0 10*3/uL (ref 0.0–0.7)
EOS PCT: 0 %
HCT: 33.7 % — ABNORMAL LOW (ref 36.0–46.0)
HEMOGLOBIN: 10.5 g/dL — AB (ref 12.0–15.0)
Lymphocytes Relative: 3 %
Lymphs Abs: 0.6 10*3/uL — ABNORMAL LOW (ref 0.7–4.0)
MCH: 30.1 pg (ref 26.0–34.0)
MCHC: 31.2 g/dL (ref 30.0–36.0)
MCV: 96.6 fL (ref 78.0–100.0)
Monocytes Absolute: 1 10*3/uL (ref 0.1–1.0)
Monocytes Relative: 5 %
NEUTROS PCT: 92 %
Neutro Abs: 19.1 10*3/uL — ABNORMAL HIGH (ref 1.7–7.7)
PLATELETS: 76 10*3/uL — AB (ref 150–400)
RBC: 3.49 MIL/uL — AB (ref 3.87–5.11)
RDW: 19.6 % — ABNORMAL HIGH (ref 11.5–15.5)
WBC: 20.7 10*3/uL — AB (ref 4.0–10.5)

## 2016-03-09 LAB — I-STAT ARTERIAL BLOOD GAS, ED
ACID-BASE EXCESS: 6 mmol/L — AB (ref 0.0–2.0)
Bicarbonate: 29.8 mmol/L — ABNORMAL HIGH (ref 20.0–28.0)
O2 Saturation: 96 %
PH ART: 7.495 — AB (ref 7.350–7.450)
PO2 ART: 76 mmHg — AB (ref 83.0–108.0)
TCO2: 31 mmol/L (ref 0–100)
pCO2 arterial: 38.6 mmHg (ref 32.0–48.0)

## 2016-03-09 LAB — COMPREHENSIVE METABOLIC PANEL
ALBUMIN: 2.6 g/dL — AB (ref 3.5–5.0)
ALK PHOS: 158 U/L — AB (ref 38–126)
ALT: 26 U/L (ref 14–54)
AST: 54 U/L — ABNORMAL HIGH (ref 15–41)
Anion gap: 10 (ref 5–15)
BUN: 16 mg/dL (ref 6–20)
CHLORIDE: 95 mmol/L — AB (ref 101–111)
CO2: 31 mmol/L (ref 22–32)
CREATININE: 2.11 mg/dL — AB (ref 0.44–1.00)
Calcium: 8.2 mg/dL — ABNORMAL LOW (ref 8.9–10.3)
GFR calc non Af Amer: 23 mL/min — ABNORMAL LOW (ref >60)
GFR, EST AFRICAN AMERICAN: 27 mL/min — AB (ref >60)
Glucose, Bld: 207 mg/dL — ABNORMAL HIGH (ref 65–99)
Potassium: 3.3 mmol/L — ABNORMAL LOW (ref 3.5–5.1)
SODIUM: 136 mmol/L (ref 135–145)
Total Bilirubin: 4.8 mg/dL — ABNORMAL HIGH (ref 0.3–1.2)
Total Protein: 6.4 g/dL — ABNORMAL LOW (ref 6.5–8.1)

## 2016-03-09 LAB — BRAIN NATRIURETIC PEPTIDE: B Natriuretic Peptide: 703.1 pg/mL — ABNORMAL HIGH (ref 0.0–100.0)

## 2016-03-09 LAB — I-STAT CG4 LACTIC ACID, ED: LACTIC ACID, VENOUS: 3.26 mmol/L — AB (ref 0.5–1.9)

## 2016-03-09 LAB — PROTIME-INR
INR: 9.77 — AB
PROTHROMBIN TIME: 81.8 s — AB (ref 11.4–15.2)

## 2016-03-09 LAB — I-STAT TROPONIN, ED: Troponin i, poc: 0.21 ng/mL (ref 0.00–0.08)

## 2016-03-09 MED ORDER — BUMETANIDE 0.25 MG/ML IJ SOLN
2.0000 mg | Freq: Two times a day (BID) | INTRAMUSCULAR | Status: DC
  Administered 2016-03-10: 2 mg via INTRAVENOUS
  Filled 2016-03-09: qty 8

## 2016-03-09 MED ORDER — SODIUM CHLORIDE 0.9% FLUSH
3.0000 mL | Freq: Two times a day (BID) | INTRAVENOUS | Status: DC
  Administered 2016-03-09: 3 mL via INTRAVENOUS

## 2016-03-09 MED ORDER — ONDANSETRON HCL 4 MG/2ML IJ SOLN: 4.0000 mg | Freq: Four times a day (QID) | INTRAMUSCULAR | Status: DC | PRN

## 2016-03-09 MED ORDER — SODIUM CHLORIDE 0.9% FLUSH: 3.0000 mL | INTRAVENOUS | Status: DC | PRN

## 2016-03-09 MED ORDER — POTASSIUM CHLORIDE IN NACL 40-0.9 MEQ/L-% IV SOLN
INTRAVENOUS | Status: DC
  Administered 2016-03-10: 50 mL/h via INTRAVENOUS
  Filled 2016-03-09 (×3): qty 1000

## 2016-03-09 MED ORDER — ASPIRIN 325 MG PO TABS
325.0000 mg | ORAL_TABLET | Freq: Once | ORAL | Status: AC
  Administered 2016-03-09: 325 mg via ORAL
  Filled 2016-03-09: qty 1

## 2016-03-09 MED ORDER — BUMETANIDE 0.25 MG/ML IJ SOLN
2.0000 mg | Freq: Two times a day (BID) | INTRAMUSCULAR | Status: DC
  Filled 2016-03-09 (×2): qty 8

## 2016-03-09 MED ORDER — DILTIAZEM HCL 25 MG/5ML IV SOLN
10.0000 mg | Freq: Once | INTRAVENOUS | Status: AC
  Administered 2016-03-09: 10 mg via INTRAVENOUS
  Filled 2016-03-09: qty 5

## 2016-03-09 MED ORDER — PHYTONADIONE 5 MG PO TABS
5.0000 mg | ORAL_TABLET | Freq: Once | ORAL | Status: AC
  Administered 2016-03-09: 5 mg via ORAL
  Filled 2016-03-09: qty 1

## 2016-03-09 MED ORDER — FUROSEMIDE 10 MG/ML IJ SOLN
40.0000 mg | Freq: Once | INTRAMUSCULAR | Status: AC
  Administered 2016-03-09: 40 mg via INTRAVENOUS
  Filled 2016-03-09: qty 4

## 2016-03-09 MED ORDER — LEVOTHYROXINE SODIUM 75 MCG PO TABS
150.0000 ug | ORAL_TABLET | Freq: Every day | ORAL | Status: DC
  Administered 2016-03-10: 150 ug via ORAL
  Filled 2016-03-09: qty 2

## 2016-03-09 MED ORDER — SODIUM CHLORIDE 0.9 % IV SOLN
250.0000 mL | INTRAVENOUS | Status: DC | PRN
  Administered 2016-03-10: 250 mL via INTRAVENOUS

## 2016-03-09 MED ORDER — AMIODARONE HCL 100 MG PO TABS
100.0000 mg | ORAL_TABLET | Freq: Every day | ORAL | Status: DC
  Administered 2016-03-10: 100 mg via ORAL
  Filled 2016-03-09: qty 1

## 2016-03-09 MED ORDER — ONDANSETRON HCL 4 MG PO TABS: 4.0000 mg | ORAL_TABLET | Freq: Four times a day (QID) | ORAL | Status: DC | PRN

## 2016-03-09 NOTE — ED Triage Notes (Signed)
Per EMS: Pt comlaining of new onset SOB since this AM. Pt with hx of CHF, states + 7lbs weight gain since yesterday am. Pt 90% on RA, tachy in the 140's. 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

## 2016-03-09 NOTE — ED Notes (Signed)
Rechecked BP in R arm - 91/68 BP in L arm - 136/100. Will continue to monitor.

## 2016-03-09 NOTE — ED Notes (Signed)
Patient reports PT/INR drawn by Home Health earlier today. Elevated. Instructed to take Vitamin K 5 mg x 2. Taken approx 1430 this afternoon.

## 2016-03-09 NOTE — ED Notes (Signed)
Notified resident of elevated PT/INR result. MD aware.

## 2016-03-09 NOTE — ED Provider Notes (Signed)
[facility]-EMERGENCY DEPT Provider Note   CSN: [id number] Arrival date & time: 2016/04/08  1756   History   Chief Complaint Chief Complaint  Patient presents with  . Shortness of Breath  . Tachycardia    HPI Jamie Howell is a 65 y.o. female.  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 Patient has had intermittent shortness of breath in the last couple of weeks but no increased need for O2. Patient has used her oxygen today without relief. She has been tracking her weight and appears to have gained ~7lbs in last 24hrs. Patient endorses some nausea this morning, but denies chest pain, abdominal pain, vomiting, diarrhea, constipation, dysuria, hematuria, melena, epistaxis, change in mental status. Patient has had labile INR and was instructed to take 2 pills of vitamin K today by her hospice group who manages her INR.      Past Medical History:  Diagnosis Date  . Anemia   . Aortic valve stenosis    moderate aortic valve stenosis with moderate aortic insufficiency, stable MVR, trivial PR, mild TR, severe LAE, grade II-III diastolic dysfunction,mild pulmonary HTN , EF 50% by echo 12/2012  . Atypical atrial flutter (HCC)    s/p DCCV  . Chronic combined systolic (congestive) and diastolic (congestive) heart failure   . History of blood transfusion 09/2014   "during hospitalization"  . HTN (hypertension)   . Hypothyroidism   . 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 Obesity (BMI 30-39.9) 07/08/2014  . Paroxysmal atrial fibrillation (HCC)   . Paroxysmal atrial tachycardia (HCC)    s/p DCCV  . Pneumonia ~ 2014; 05/2014  . Portal hypertension with esophageal  varices (HCC) 07/14/2014  . Pulmonary HTN    severe by echo 07/2015  . Rheumatic fever   . S/P mitral valve replacement with metallic valve 04/08/1991   31 mm St Jude bileaflet mechanical valve placed for rheumatic mitral stenosis  . 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 Splenomegaly 07/14/2014  . Subdural hematoma (HCC)    spontaneous  . Temporary low platelet count (HCC)   . Thyrotoxicosis     without mention of goiter or other cause, without mention of thyrotoxic crisis or storm    Patient Active Problem List   Diagnosis Date Noted  . DNR (do not resuscitate) 02/18/2016  . Chronic liver disease   . Palliative care encounter   . Long term current use of amiodarone 02/09/2016  . Supratherapeutic INR 02/09/2016  . Anemia 02/09/2016  . Cardiac cirrhosis 02/08/2016  . Dehydration 02/08/2016  . CKD (chronic kidney disease) stage 3, GFR 30-59 ml/min 07/25/2015  . Atypical atrial flutter (HCC) 01/07/2015  . Wound dehiscence, surgical 12/13/2014  . S/p TAVR (transcatheter aortic valve replacement), bioprosthetic 12/13/2014  . Wound, surgical, infected 10/20/2014  . Wound infection after surgery 10/18/2014  . S/P tooth extraction 08/05/2014  . H/O mitral valve replacement with mechanical valve   . Thrombocytopenia (HCC) 07/22/2014  . Splenomegaly 07/14/2014  . Portal hypertension with esophageal varices (HCC) 07/14/2014  . Obesity (BMI 30-39.9) 07/08/2014  . SOB (shortness of  breath) 05/07/2014  . Chronic combined systolic and diastolic CHF (congestive heart failure) (HCC) 01/19/2013  . Pulmonary HTN 01/19/2013  . 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 OA (ocular albinism) (HCC)   . HTN (hypertension)   . Thyrotoxicosis   . Hypothyroidism   . Mitral valve disorder   . Persistent atrial fibrillation (HCC)   . S/P mitral valve replacement  with metallic valve 04/08/1991    Past Surgical History:  Procedure Laterality Date  . APPLICATION OF WOUND VAC Left 10/20/2014   Procedure: APPLICATION OF WOUND VAC;  Surgeon: [name], MD;  Location: [facility] OR;  Service: Thoracic;  Laterality: Left;  . BRAIN SURGERY    . CARDIAC CATHETERIZATION     06/2014  . CARDIAC VALVE REPLACEMENT    . CARDIOVERSION N/A 02/01/2015   Procedure: CARDIOVERSION;  Surgeon: Quintella Reichert, MD;  Location: [facility] ENDOSCOPY;  Service: Cardiovascular;  Laterality: N/A;  . ESOPHAGOGASTRODUODENOSCOPY (EGD) WITH PROPOFOL N/A 02/17/2016   Procedure: ESOPHAGOGASTRODUODENOSCOPY (EGD) WITH PROPOFOL;  Surgeon: Bernette Redbird, MD;  Location: [facility] ENDOSCOPY;  Service: Endoscopy;  Laterality: N/A;  . LAPAROSCOPIC CHOLECYSTECTOMY  05-30-90  . LEFT AND RIGHT HEART CATHETERIZATION WITH CORONARY ANGIOGRAM N/A 06/28/2014   Procedure: LEFT AND RIGHT HEART CATHETERIZATION WITH CORONARY ANGIOGRAM;  Surgeon: Tonny Bollman, MD; Normal coronaries, MVR OK, severe AS, elevated R heart pressures    . MITRAL VALVE REPLACEMENT  04/08/1991   31mm St Jude mechanical valve - Dr Andrey Campanile  . MULTIPLE EXTRACTIONS WITH ALVEOLOPLASTY N/A 08/05/2014   Procedure: Extraction of tooth #'s 3,4,5,7,8,9,10,11,12,13,17,20,21,22,23,24,25,26,27, 28,29, and 32 with alveoloplasty;  Surgeon: Charlynne Pander, DDS;  Location: [facility] OR;  Service: Oral Surgery;  Laterality: N/A;  . SUBDURAL HEMATOMA EVACUATION VIA CRANIOTOMY  2000   Dr Dutch Quint  . TEE WITHOUT CARDIOVERSION N/A 09/28/2014   Procedure: TRANSESOPHAGEAL ECHOCARDIOGRAM (TEE);  Surgeon: Tonny Bollman, MD;  Location: [facility] OR;  Service: Open Heart Surgery;  Laterality: N/A;  . TONSILLECTOMY    . TOTAL ABDOMINAL HYSTERECTOMY  07-23-1994  . TRANSCATHETER AORTIC VALVE REPLACEMENT, TRANSFEMORAL N/A 09/28/2014   Procedure: TRANSCATHETER AORTIC VALVE REPLACEMENT, TRANSFEMORAL;  Surgeon: Tonny Bollman, MD;  Location: [facility] OR;  Service: Open Heart Surgery;  Laterality: N/A;  .  WOUND EXPLORATION Left 10/20/2014   Procedure: WOUND EXPLORATION;  Surgeon: [name], MD;  Location: [facility] OR;  Service: Thoracic;  Laterality: Left;    OB History    No data available       Home Medications    Prior to Admission medications   Medication Sig Start Date End Date Taking? Authorizing Provider  acetaminophen (TYLENOL) 325 MG tablet Take 325-650 mg by mouth every 6 (six) hours as needed for headache.    Historical Provider, MD  amiodarone (PACERONE) 100 MG tablet Take 1 tablet (100 mg total) by mouth daily. 03/05/16   Dyann Kief, PA-C  Calcium Carb-Cholecalciferol (CALCIUM 1000 + D PO) Take 1,000 mg by mouth daily with lunch.     Historical Provider, MD  ferrous sulfate 325 (65 FE) MG EC tablet Take 325 mg by mouth daily with breakfast.    Historical Provider, MD  levothyroxine (SYNTHROID, LEVOTHROID) 150 MCG tablet Take 150 mcg by mouth daily before breakfast.  10/18/14   Historical Provider, MD  potassium chloride SA (K-DUR,KLOR-CON) 20 MEQ tablet Take 20 mEq by mouth 2 (two) times daily. MORNING AND LUNCHTIME    Historical Provider, MD  torsemide (DEMADEX) 20 MG tablet Take 2 tablets (40 mg  total) by mouth daily. 03/05/16   Dyann KiefMichele M Lenze, PA-C  warfarin (COUMADIN) 5 MG tablet Take 0.5 tablets (2.5 mg total) by mouth at bedtime. Or as directed. 02/18/16   Beatrice LecherScott T Weaver, PA-C    Family History Family History  Problem Relation Age of Onset  . CAD Father   . Hypertension Father   . CAD Mother   . Cirrhosis Brother   . Heart attack Sister     Social History Social History  Substance Use Topics  . Smoking status: Never Smoker  . Smokeless tobacco: Never Used  . Alcohol use No     Allergies   Adhesive [tape]; Spironolactone; Penicillins; and Sulfa antibiotics   Review of Systems Review of Systems  Constitutional: Positive for unexpected weight change (7 lb weight gain). Negative for activity change, appetite change, chills, diaphoresis and fever.    HENT: Negative for nosebleeds.   Eyes: Negative for visual disturbance.  Respiratory: Positive for shortness of breath. Negative for cough, chest tightness and wheezing.   Cardiovascular: Positive for leg swelling. Negative for chest pain and palpitations.  Gastrointestinal: Positive for nausea. Negative for abdominal distention, abdominal pain, anal bleeding, blood in stool, constipation, diarrhea and vomiting.  Genitourinary: Negative for difficulty urinating, dysuria, flank pain, frequency, hematuria and urgency.  Musculoskeletal: Negative.   Skin: Negative for color change (chronically jaundiced no change per family) and rash.  Neurological: Negative for dizziness, weakness, light-headedness and numbness.  Hematological: Negative for adenopathy.  Psychiatric/Behavioral: Negative for behavioral problems, confusion and decreased concentration. The patient is not nervous/anxious.      Physical Exam Updated Vital Signs BP (!) 98/48   Pulse 100   Temp 98.7 F (37.1 C) (Oral)   Resp 23   SpO2 97%   Physical Exam  Constitutional: She is oriented to person, place, and time. She appears well-developed and well-nourished. She appears distressed.  HENT:  Head: Normocephalic and atraumatic.  Eyes: Conjunctivae and EOM are normal. Scleral icterus is present.  Neck: Normal range of motion. Neck supple.  Cardiovascular:  Tachycardic, irregularly irregular, mechanical S1; varicose veins bil LE  Pulmonary/Chest: Effort normal and breath sounds normal. She has no wheezes.  Mild increased work of breathing, bibasilar crackles  Abdominal: Soft. Bowel sounds are normal. She exhibits no distension. There is no tenderness.  Musculoskeletal: Normal range of motion. She exhibits edema (1+ pitting edema bil LE). She exhibits no tenderness.  Neurological: She is alert and oriented to person, place, and time. No cranial nerve deficit. She exhibits normal muscle tone.  Skin: Skin is warm and dry. She is  not diaphoretic.  Jaundiced  Psychiatric: She has a normal mood and affect. Her behavior is normal. Judgment and thought content normal.     ED Treatments / Results  Labs (all labs ordered are listed, but only abnormal results are displayed) Labs Reviewed  CBC WITH DIFFERENTIAL/PLATELET - Abnormal; Notable for the following:       Result Value   WBC 20.7 (*)    RBC 3.49 (*)    Hemoglobin 10.5 (*)    HCT 33.7 (*)    RDW 19.6 (*)    Platelets 76 (*)    Neutro Abs 19.1 (*)    Lymphs Abs 0.6 (*)    All other components within normal limits  COMPREHENSIVE METABOLIC PANEL - Abnormal; Notable for the following:    Potassium 3.3 (*)    Chloride 95 (*)    Glucose, Bld 207 (*)    Creatinine, Ser  2.11 (*)    Calcium 8.2 (*)    Total Protein 6.4 (*)    Albumin 2.6 (*)    AST 54 (*)    Alkaline Phosphatase 158 (*)    Total Bilirubin 4.8 (*)    GFR calc non Af Amer 23 (*)    GFR calc Af Amer 27 (*)    All other components within normal limits  BRAIN NATRIURETIC PEPTIDE - Abnormal; Notable for the following:    B Natriuretic Peptide 703.1 (*)    All other components within normal limits  I-STAT TROPOININ, ED - Abnormal; Notable for the following:    Troponin i, poc 0.21 (*)    All other components within normal limits  I-STAT ARTERIAL BLOOD GAS, ED - Abnormal; Notable for the following:    pH, Arterial 7.495 (*)    pO2, Arterial 76.0 (*)    Bicarbonate 29.8 (*)    Acid-Base Excess 6.0 (*)    All other components within normal limits  CULTURE, BLOOD (ROUTINE X 2)  CULTURE, BLOOD (ROUTINE X 2)  BLOOD GAS, ARTERIAL  PROTIME-INR  I-STAT CG4 LACTIC ACID, ED   EKG  EKG Interpretation  Date/Time:  Friday March 09 2016 19:16:31 EST Ventricular Rate:  102 PR Interval:    QRS Duration: 114 QT Interval:  357 QTC Calculation: 465 R Axis:   118 Text Interpretation:  atrial fibrillation  Borderline intraventricular conduction delay Nonspecific T abnormalities, lateral leads rate  slower than last tracing  Confirmed by YAO  MD, DAVID ([id number]) on 03/02/2016 8:11:23 PM       Radiology Dg Chest Port 1 View  Result Date: 03/22/2016 CLINICAL DATA:  New onset shortness of breath this morning. Seventh and oblique cane since yesterday, history of CHF. Hypoxia and tachycardia. EXAM: PORTABLE CHEST 1 VIEW COMPARISON:  10/18/2014 FINDINGS: Patient is post median sternotomy and TAVR. The heart is enlarged, accentuated by low lung volumes and portable technique. Mild perihilar pulmonary edema. Linear atelectasis in both mid lung zones. No large pleural effusion, basilar assessment limited by technique. No pneumothorax or acute osseous abnormality. IMPRESSION: Cardiomegaly with perihilar edema concerning for CHF. Linear midlung zone atelectasis. Electronically Signed   By: Rubye OaksMelanie  Ehinger M.D.   On: 2015-08-04 18:40   Procedures Procedures (including critical care time)  Medications Ordered in ED Medications  diltiazem (CARDIZEM) injection 10 mg (10 mg Intravenous Given 03/17/2016 1837)  furosemide (LASIX) injection 40 mg (40 mg Intravenous Given 03/19/2016 1906)  aspirin tablet 325 mg (325 mg Oral Given 03/29/2016 1952)    Initial Impression / Assessment and Plan / ED Course  I have reviewed the triage vital signs and the nursing notes.  Pertinent labs & imaging results that were available during my care of the patient were reviewed by me and considered in my medical decision making (see chart for details).  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 On arrival her O2 sat was 85%; she was placed on non-rebreather and transitioned down to [place]  with good response. Patient was tachycardic and had not taken her medicines yet today. Initial EKG showed atrial flutter with rate of 140. CXR consistent with pulmonary  congestion. BNP 700's. CBC shows leukocytosis to 20.7 with neutrophil predominance. Initial troponin was elevated at 0.2 with repeat EKG after Cardizem 10mg  IV showing NSR without ST segment or T wave changes concerning for ischemia. INR 9.77 (was apparently 10 earlier today and she was instructed to take 10mg  Vitamin K which she endorses taking). Patient not actively bleeding and Hgb stable at 10.5.   She was given lasix 40mg  IV once, Cardizem 10mg  IV once, Vitamin K 5mg  one (received 10mg  at home).  Patient to be admitted for management of volume overload by cardiology.   Final Clinical Impressions(s) / ED Diagnoses   Final diagnoses:  Hypoxia   New Prescriptions New Prescriptions   No medications on file     Nyra Market, MD 03-24-2016 2122    Charlynne Pander, MD 03/10/16 [phone]

## 2016-03-09 NOTE — H&P (Signed)
CARDIOLOGY H&P  HPI:  Patient presents from home with complaints of dyspnea, nausea, fatigue and found to have elevated INR.  She states over the last 2-3 days she has progressively developed shortness of breath and fatigue.  Her hospice nurse was at her house and found her INR to be elevated.  She also endorses a 5-10 lbs weight gain over the last few days.  She states she has not been taking her medications secondary to her nausea.  On initial presentation she was found to be in afib with rates in the 140s-150s.  In the ED she was given 10 mg of IV diltiazem and 40 mg of IV lasix.  Given her INR of 9.77 she was also given vitamin K.  There is no evidence of bleeding.  Review of Systems:     Cardiac Review of Systems: [Y] = yes [ ]  = no  Chest Pain [    ]  Resting SOB [ Y ] Exertional SOB  [ Y ]  Orthopnea [ Y ]   Pedal Edema [   ]    Palpitations [  ] Syncope  [  ]   Presyncope [   ]  General Review of Systems: [ Y ] = yes [  ]=no Constitional: recent weight change [  ]; anorexia [ Y ]; fatigue [ Y ]; nausea [  ]; night sweats [  ]; fever [  ]; or chills [  ];                    Dental: poor dentition [  ];   Eye : blurred vision [  ]; diplopia [   ]; vision changes [  ];  Amaurosis fugax [  ]; Resp: cough [  ];  wheezing [  ];  hemoptysis [  ]; shortness of breath [  ]; paroxysmal nocturnal dyspnea [  ]; dyspnea on exertion [  ]; or orthopnea [  ];  GI:  gallstones [  ], vomiting [  ];  dysphagia [  ]; melena[  ];  hematochezia [  ]; heartburn[  ];   GU: kidney stones [  ]; hematuria[  ];   dysuria [  ];  nocturia[  ];               Skin: rash [  ], swelling[  ];, hair loss[  ];  peripheral edema[  ];  or itching[  ]; Musculosketetal: myalgias[  ];  joint swelling[  ];  joint erythema[  ];  joint pain[  ];  back pain[  ];  Heme/Lymph: bruising[  ];  bleeding[  ];  anemia[  ];  Neuro: TIA[  ];  headaches[  ];  stroke[  ];  vertigo[  ];  seizures[  ];   paresthesias[  ];  difficulty walking[   ];  Psych:depression[  ]; anxiety[  ];  Endocrine: diabetes[  ];  thyroid dysfunction[  ];  Other:  Past Medical History:  Diagnosis Date  . Anemia   . Aortic valve stenosis    moderate aortic valve stenosis with moderate aortic insufficiency, stable MVR, trivial PR, mild TR, severe LAE, grade II-III diastolic dysfunction,mild pulmonary HTN , EF 50% by echo 12/2012  . Atypical atrial flutter (HCC)    s/p DCCV  . Chronic combined systolic (congestive) and diastolic (congestive) heart failure   . History of blood transfusion 09/2014   "during hospitalization"  . HTN (hypertension)   .  Hypothyroidism   . 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 Obesity (BMI 30-39.9) 07/08/2014  . Paroxysmal atrial fibrillation (HCC)   . Paroxysmal atrial tachycardia (HCC)    s/p DCCV  . Pneumonia ~ 2014; 05/2014  . Portal hypertension with esophageal varices (HCC) 07/14/2014  . Pulmonary HTN    severe by echo 07/2015  . Rheumatic fever   . S/P mitral valve replacement with metallic valve 04/08/1991   31 mm St Jude bileaflet mechanical valve placed for rheumatic mitral stenosis  . 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 Splenomegaly 07/14/2014  . Subdural hematoma (HCC)    spontaneous  . Temporary low platelet count (HCC)   . Thyrotoxicosis     without mention of goiter or other cause, without mention of thyrotoxic crisis or storm    Prior to Admission medications   Medication Sig Start Date End Date Taking? Authorizing Provider  amiodarone (PACERONE) 100 MG tablet Take 1 tablet (100 mg total) by mouth daily. 03/05/16  Yes Dyann KiefMichele M Lenze, PA-C  Calcium Carb-Cholecalciferol (CALCIUM 1000 + D PO) Take 1,000 mg by mouth daily with lunch.    Yes Historical Provider, MD  ferrous sulfate 325 (65 FE) MG EC tablet Take 325 mg by mouth daily with breakfast.    Yes Historical Provider, MD  levothyroxine (SYNTHROID, LEVOTHROID) 150 MCG tablet Take 150 mcg by mouth daily before breakfast.  10/18/14  Yes Historical Provider, MD  Melatonin 5 MG TABS Take 5 mg by mouth at bedtime.   Yes Historical Provider, MD  ondansetron (ZOFRAN) 4 MG tablet Take 4 mg by mouth every 6 (six) hours as needed for nausea or vomiting.   Yes Historical Provider, MD  phytonadione (VITAMIN K) 5 MG tablet Take 5 mg by mouth daily as needed.   Yes Historical Provider, MD  potassium chloride SA (K-DUR,KLOR-CON) 20 MEQ tablet Take 40 mEq by mouth 2 (two) times daily. MORNING AND LUNCHTIME   Yes Historical Provider, MD  torsemide (DEMADEX) 20 MG tablet Take 2 tablets (40 mg total) by mouth daily. 03/05/16  Yes Dyann KiefMichele M Lenze, PA-C  warfarin (COUMADIN) 2.5 MG tablet Take 1.25-2.5 mg by mouth See admin instructions. 2.5 mg in the evening on Sun/Tues/Thurs/Sat and 1.25 mg on Mon/Wed/Fri   Yes Historical Provider, MD  warfarin (COUMADIN) 5 MG tablet Take 0.5 tablets (2.5 mg total) by mouth at bedtime. Or as directed. Patient not taking: Reported on 03/17/2016 02/18/16   Beatrice LecherScott T Weaver, PA-C    Allergies  Allergen Reactions  . Adhesive [Tape] Itching    Adhesive tape and backing (pls use elastic bandages with no adhesive)  . Spironolactone Other (See Comments)    Severe dizziness and nausea  . Penicillins Rash    Has patient had a PCN reaction causing immediate rash, facial/tongue/throat swelling, SOB or lightheadedness with hypotension: Yes Has patient had a PCN reaction causing severe rash involving mucus membranes or skin necrosis: No Has patient had a PCN reaction that required hospitalization No Has patient had a PCN reaction occurring within the last 10 years: No If all of the above answers are "NO", then may proceed with Cephalosporin use.   . Sulfa Antibiotics Rash    Social History   Social History  . Marital status: Single    Spouse name: N/A  . Number of children: 2    .  Years of education: N/A   Occupational History  . circulation desk clerk    Social History Main Topics  . Smoking status: Never Smoker  . Smokeless tobacco: Never Used  . Alcohol use No  . Drug use: No  . Sexual activity: Not Currently   Other Topics Concern  . Not on file   Social History Narrative   Pt lives in [place] with son.  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    Family History  Problem Relation Age of Onset  . CAD Father   . Hypertension Father   . CAD Mother   . Cirrhosis Brother   . Heart attack Sister     PHYSICAL EXAM: Vitals:   03/05/2016 2130 03/16/2016 2145  BP: 95/65 94/71  Pulse: 100 95  Resp: 22 23  Temp:     General:  Well appearing. Appears mildly short of breath HEENT: normal Neck: supple. JVD just above the clavicle at 30 degrees. Carotids 2+ bilat; no bruits. No lymphadenopathy or thryomegaly appreciated. Cor: PMI nondisplaced. Irregular. No rubs, gallops.  Systolic and diastolic murmur noted with diastolic click consistent with mechanical mitral valve. Lungs: mild rales and rhonchi though out the chest Abdomen: soft, nontender, moderately distended. No hepatosplenomegaly. No bruits or masses. Good bowel sounds. Extremities: no cyanosis, clubbing, rash, edema.  Lower extremity changes consistent with venous stases.   Neuro: alert & oriented x 3, cranial nerves grossly intact. moves all 4 extremities w/o difficulty. Affect pleasant.  ECG:  Results for orders placed or performed during the hospital encounter of 03/19/2016 (from the past 24 hour(s))  CBC with Differential/Platelet     Status: Abnormal   Collection Time: 03/02/2016  6:32 PM  Result Value Ref Range   WBC 20.7 (H) 4.0 - 10.5 K/uL   RBC 3.49 (L) 3.87 - 5.11 MIL/uL   Hemoglobin 10.5 (L) 12.0 - 15.0 g/dL   HCT 81.1 (L) 91.4 - 78.2 %   MCV 96.6 78.0 - 100.0 fL   MCH 30.1 26.0 - 34.0 pg   MCHC 31.2 30.0 - 36.0 g/dL   RDW 95.6 (H) 21.3 - 08.6 %   Platelets 76 (L) 150 - 400  K/uL   Neutrophils Relative % 92 %   Neutro Abs 19.1 (H) 1.7 - 7.7 K/uL   Lymphocytes Relative 3 %   Lymphs Abs 0.6 (L) 0.7 - 4.0 K/uL   Monocytes Relative 5 %   Monocytes Absolute 1.0 0.1 - 1.0 K/uL   Eosinophils Relative 0 %   Eosinophils Absolute 0.0 0.0 - 0.7 K/uL   Basophils Relative 0 %   Basophils Absolute 0.0 0.0 - 0.1 K/uL  Comprehensive metabolic panel     Status: Abnormal   Collection Time: 03/14/2016  6:32 PM  Result Value Ref Range   Sodium 136 135 - 145 mmol/L   Potassium 3.3 (L) 3.5 - 5.1 mmol/L   Chloride 95 (L) 101 - 111 mmol/L   CO2 31 22 - 32 mmol/L   Glucose, Bld 207 (H) 65 - 99 mg/dL   BUN 16 6 - 20 mg/dL   Creatinine, Ser 5.78 (H) 0.44 - 1.00 mg/dL   Calcium 8.2 (L) 8.9 - 10.3 mg/dL   Total Protein 6.4 (L) 6.5 - 8.1 g/dL   Albumin 2.6 (L) 3.5 - 5.0 g/dL   AST 54 (H) 15 - 41 U/L   ALT 26 14 - 54 U/L   Alkaline Phosphatase 158 (H) 38 - 126 U/L   Total Bilirubin 4.8 (  H) 0.3 - 1.2 mg/dL   GFR calc non Af Amer 23 (L) >60 mL/min   GFR calc Af Amer 27 (L) >60 mL/min   Anion gap 10 5 - 15  Brain natriuretic peptide     Status: Abnormal   Collection Time: 03/16/2016  6:32 PM  Result Value Ref Range   B Natriuretic Peptide 703.1 (H) 0.0 - 100.0 pg/mL  Protime-INR     Status: Abnormal   Collection Time: 03/05/2016  6:32 PM  Result Value Ref Range   Prothrombin Time 81.8 (H) 11.4 - 15.2 seconds   INR 9.77 (HH)   I-Stat arterial blood gas, ED     Status: Abnormal   Collection Time: 03/03/2016  6:44 PM  Result Value Ref Range   pH, Arterial 7.495 (H) 7.350 - 7.450   pCO2 arterial 38.6 32.0 - 48.0 mmHg   pO2, Arterial 76.0 (L) 83.0 - 108.0 mmHg   Bicarbonate 29.8 (H) 20.0 - 28.0 mmol/L   TCO2 31 0 - 100 mmol/L   O2 Saturation 96.0 %   Acid-Base Excess 6.0 (H) 0.0 - 2.0 mmol/L   Patient temperature 98.7 F    Collection site RADIAL, ALLEN'S TEST ACCEPTABLE    Drawn by RT    Sample type ARTERIAL   I-stat troponin, ED     Status: Abnormal   Collection Time: 03/23/2016   7:02 PM  Result Value Ref Range   Troponin i, poc 0.21 (HH) 0.00 - 0.08 ng/mL   Comment NOTIFIED PHYSICIAN    Comment 3          I-Stat CG4 Lactic Acid, ED     Status: Abnormal   Collection Time: 03/18/2016  9:28 PM  Result Value Ref Range   Lactic Acid, Venous 3.26 (HH) 0.5 - 1.9 mmol/L   Comment NOTIFIED PHYSICIAN    Dg Chest Port 1 View  Result Date: 03/25/2016 CLINICAL DATA:  New onset shortness of breath this morning. Seventh and oblique cane since yesterday, history of CHF. Hypoxia and tachycardia. EXAM: PORTABLE CHEST 1 VIEW COMPARISON:  10/18/2014 FINDINGS: Patient is post median sternotomy and TAVR. The heart is enlarged, accentuated by low lung volumes and portable technique. Mild perihilar pulmonary edema. Linear atelectasis in both mid lung zones. No large pleural effusion, basilar assessment limited by technique. No pneumothorax or acute osseous abnormality. IMPRESSION: Cardiomegaly with perihilar edema concerning for CHF. Linear midlung zone atelectasis. Electronically Signed   By: Rubye OaksMelanie  Ehinger M.D.   On: 03/28/2016 18:40     ASSESSMENT/PLAN  Chronic combined systolic and diastolic CHF   Assessment:  Patient with a history of systolic and diastolic CHF now with decompensation.  Not sure if the afib w/RV resulted in the decompensation or vice versa.  Regardless she appears to be cold and wet.  We'll gently diuresis given her soft blood pressures.  I suspect we may need to use some dopamine to support her pressures while we diuresis her.     Plan  -  Bumex 2 mg BID starting tomorrow  -  Hold beta blockade and ACE given her acute decompensation and elevated Cr  -  Replace electrolytes  -  Continue amiodarone for rate control  -  Consider dopamine if her pressures continue to be low Supratherapeutic INR   Assessment:  Patient with elevated INR on presentation.  Has gotten vitamin K while in the ED.  Will continue to monitor for bleeding and check INR q6 until therapeutic  between 2.5-3.5.  If INR at  the next draw is > 6, will consider another dose of VitK; no need for FFP right now.   Plan  -  Monitor for bleeding  -  INR q6  -  Hold coumadin until INR is 2.5-3.5 Persistent atrial fib   Assessment:  History of persistent atrial fib.  On presentation she was tachycardia which is likely due to her not taking her medications due to feeling sick.  Is rate controlled on my exam in the ED.     Plan  -  Continue amiodarone  -  If she continues to be unable to take PO medications, will start her on IV amiodarone  -  Anticoagulation regulation as noted above HTN   Assessment:  Patient with a history of hypertension, however, she is hypotensive on exam.  Seems to be cold and wet.  Will hold all BP lower medications and restart as needed.   Plan  -  Monitor BP  -  May need dopamine, but will hold for now  Patient is DNR

## 2016-03-10 ENCOUNTER — Inpatient Hospital Stay (HOSPITAL_COMMUNITY): Payer: PPO

## 2016-03-10 ENCOUNTER — Encounter (HOSPITAL_COMMUNITY): Payer: Self-pay | Admitting: *Deleted

## 2016-03-10 DIAGNOSIS — I5042 Chronic combined systolic (congestive) and diastolic (congestive) heart failure: Secondary | ICD-10-CM

## 2016-03-10 DIAGNOSIS — I509 Heart failure, unspecified: Secondary | ICD-10-CM

## 2016-03-10 LAB — BASIC METABOLIC PANEL
Anion gap: 9 (ref 5–15)
BUN: 20 mg/dL (ref 6–20)
CHLORIDE: 95 mmol/L — AB (ref 101–111)
CO2: 33 mmol/L — AB (ref 22–32)
Calcium: 8 mg/dL — ABNORMAL LOW (ref 8.9–10.3)
Creatinine, Ser: 2.31 mg/dL — ABNORMAL HIGH (ref 0.44–1.00)
GFR calc Af Amer: 24 mL/min — ABNORMAL LOW (ref >60)
GFR calc non Af Amer: 21 mL/min — ABNORMAL LOW (ref >60)
Glucose, Bld: 137 mg/dL — ABNORMAL HIGH (ref 65–99)
POTASSIUM: 3.3 mmol/L — AB (ref 3.5–5.1)
SODIUM: 137 mmol/L (ref 135–145)

## 2016-03-10 LAB — CBC WITH DIFFERENTIAL/PLATELET
Basophils Absolute: 0 10*3/uL (ref 0.0–0.1)
Basophils Relative: 0 %
EOS PCT: 0 %
Eosinophils Absolute: 0 10*3/uL (ref 0.0–0.7)
HCT: 29.2 % — ABNORMAL LOW (ref 36.0–46.0)
HEMOGLOBIN: 9 g/dL — AB (ref 12.0–15.0)
LYMPHS ABS: 0.6 10*3/uL — AB (ref 0.7–4.0)
LYMPHS PCT: 7 %
MCH: 29.9 pg (ref 26.0–34.0)
MCHC: 30.8 g/dL (ref 30.0–36.0)
MCV: 97 fL (ref 78.0–100.0)
Monocytes Absolute: 0.5 10*3/uL (ref 0.1–1.0)
Monocytes Relative: 6 %
Neutro Abs: 7 10*3/uL (ref 1.7–7.7)
Neutrophils Relative %: 87 %
PLATELETS: 42 10*3/uL — AB (ref 150–400)
RBC: 3.01 MIL/uL — AB (ref 3.87–5.11)
RDW: 19.7 % — ABNORMAL HIGH (ref 11.5–15.5)
WBC: 8.1 10*3/uL (ref 4.0–10.5)

## 2016-03-10 LAB — ECHOCARDIOGRAM COMPLETE
HEIGHTINCHES: 59 in
WEIGHTICAEL: 3142.88 [oz_av]

## 2016-03-10 LAB — PROTIME-INR
INR: 8.35
INR: 5.87 — AB
INR: 5.27
INR: 5.45
PROTHROMBIN TIME: 49.9 s — AB (ref 11.4–15.2)
PROTHROMBIN TIME: 51.3 s — AB (ref 11.4–15.2)
Prothrombin Time: 72.1 seconds — ABNORMAL HIGH (ref 11.4–15.2)
Prothrombin Time: 54.4 seconds — ABNORMAL HIGH (ref 11.4–15.2)

## 2016-03-10 LAB — MRSA PCR SCREENING: MRSA by PCR: NEGATIVE

## 2016-03-10 MED ORDER — POTASSIUM CHLORIDE CRYS ER 20 MEQ PO TBCR
40.0000 meq | EXTENDED_RELEASE_TABLET | Freq: Once | ORAL | Status: AC
  Administered 2016-03-11: 40 meq via ORAL
  Filled 2016-03-10: qty 2

## 2016-03-10 MED ORDER — AMIODARONE LOAD VIA INFUSION
150.0000 mg | Freq: Once | INTRAVENOUS | Status: AC
  Administered 2016-03-10: 60 mg/h via INTRAVENOUS

## 2016-03-10 MED ORDER — FUROSEMIDE 10 MG/ML IJ SOLN
10.0000 mg/h | INTRAVENOUS | Status: DC
  Administered 2016-03-11: 10 mg/h via INTRAVENOUS
  Filled 2016-03-10: qty 25

## 2016-03-10 MED ORDER — AMIODARONE HCL IN DEXTROSE 360-4.14 MG/200ML-% IV SOLN
30.0000 mg/h | INTRAVENOUS | Status: DC
  Administered 2016-03-10 – 2016-03-11 (×3): 30 mg/h via INTRAVENOUS
  Administered 2016-03-11 (×2): 150 mg/h via INTRAVENOUS
  Administered 2016-03-11: 30 mg/h via INTRAVENOUS
  Administered 2016-03-11: 150 mg/h via INTRAVENOUS
  Filled 2016-03-10 (×3): qty 200

## 2016-03-10 MED ORDER — AMIODARONE HCL IN DEXTROSE 360-4.14 MG/200ML-% IV SOLN
INTRAVENOUS | Status: AC
  Administered 2016-03-10: 60 mg/h via INTRAVENOUS
  Filled 2016-03-10: qty 200

## 2016-03-10 MED ORDER — AMIODARONE HCL IN DEXTROSE 360-4.14 MG/200ML-% IV SOLN
60.0000 mg/h | INTRAVENOUS | Status: AC
  Administered 2016-03-10: 60 mg/h via INTRAVENOUS
  Filled 2016-03-10: qty 200

## 2016-03-10 MED ORDER — MORPHINE SULFATE (PF) 2 MG/ML IV SOLN
INTRAVENOUS | Status: AC
  Administered 2016-03-10: 2 mg via INTRAVENOUS
  Filled 2016-03-10: qty 1

## 2016-03-10 MED ORDER — DEXTROSE 5 % IV SOLN
4.0000 mg | Freq: Two times a day (BID) | INTRAVENOUS | Status: DC
  Administered 2016-03-10: 4 mg via INTRAVENOUS
  Filled 2016-03-10 (×2): qty 16

## 2016-03-10 MED ORDER — MORPHINE SULFATE (PF) 2 MG/ML IV SOLN
1.0000 mg | INTRAVENOUS | Status: DC | PRN
  Administered 2016-03-10 – 2016-03-11 (×3): 2 mg via INTRAVENOUS
  Filled 2016-03-10 (×2): qty 1

## 2016-03-10 MED ORDER — FUROSEMIDE 10 MG/ML IJ SOLN
160.0000 mg | Freq: Once | INTRAMUSCULAR | Status: AC
  Administered 2016-03-11: 160 mg via INTRAVENOUS
  Filled 2016-03-10: qty 16

## 2016-03-10 MED ORDER — PHYTONADIONE 5 MG PO TABS: 5.0000 mg | ORAL_TABLET | Freq: Once | ORAL | Status: DC

## 2016-03-10 NOTE — Progress Notes (Signed)
Patient Name: Jamie Howell Date of Encounter: 03/10/2016  Primary Cardiologist: Dr. Lenise Arenaurner  Hospital Problem List     Principal Problem:   Chronic combined systolic and diastolic CHF (congestive heart failure) (HCC) Active Problems:   HTN (hypertension)   Persistent atrial fibrillation (HCC)   Rheumatic heart disease s/p Mechanical MVR and TAVR   Supratherapeutic INR   DNR (do not resuscitate)   CHF (congestive heart failure), NYHA class IV, acute on chronic, combined (HCC)     Subjective   She is breathing OK and has had no light headedness.  Denies chest pain.   Inpatient Medications    Scheduled Meds: . amiodarone  100 mg Oral Daily  . bumetanide (BUMEX) IV  2 mg Intravenous BID  . levothyroxine  150 mcg Oral QAC breakfast  . sodium chloride flush  3 mL Intravenous Q12H   Continuous Infusions: . 0.9 % NaCl with KCl 40 mEq / L 50 mL/hr at 03/10/16 0600   PRN Meds: sodium chloride, ondansetron (ZOFRAN) IV, ondansetron, sodium chloride flush   Vital Signs    Vitals:   03/10/16 0300 03/10/16 0400 03/10/16 0500 03/10/16 0600  BP: (!) 63/44 (!) 72/45 (!) 84/63 (!) 89/56  Pulse: 80 76 77 74  Resp: (!) 24 17 (!) 21 (!) 27  Temp: 97.7 F (36.5 C)     TempSrc: Oral     SpO2: 100% 97% 100% 100%  Weight:   196 lb 6.9 oz (89.1 kg)   Height:        Intake/Output Summary (Last 24 hours) at 03/10/16 0739 Last data filed at 03/10/16 0700  Gross per 24 hour  Intake              440 ml  Output              225 ml  Net              215 ml   Filed Weights   03/24/2016 2235 03/10/16 0500  Weight: 195 lb 5.2 oz (88.6 kg) 196 lb 6.9 oz (89.1 kg)    Physical Exam    GEN: NAD.  Neck:  Positive JVD Cardiac: Irregular  Rate and Rhythm, nomurmurs, rubs, or gallops. Mechanical S1  Diffuse mild edema.  Radials/DP/PT 2+  and equal bilaterally.  Respiratory:  Respirations decreased with crackles bilateral regular and slightly labored GI: Soft, nontender, mildly distended,  BS + x 4. Skin: warm and dry, no rash. Neuro:   Strength and sensation are intact. Psych:  AAOx3.  Normal affect.  Labs    CBC  Recent Labs  03/29/2016 1832 03/10/16 0650  WBC 20.7* 8.1  NEUTROABS 19.1* 7.0  HGB 10.5* 9.0*  HCT 33.7* 29.2*  MCV 96.6 97.0  PLT 76* 42*   Basic Metabolic Panel  Recent Labs  03/05/2016 1832 03/10/16 0650  NA 136 137  K 3.3* 3.3*  CL 95* 95*  CO2 31 33*  GLUCOSE 207* 137*  BUN 16 20  CREATININE 2.11* 2.31*  CALCIUM 8.2* 8.0*   Liver Function Tests  Recent Labs  03/26/2016 1832  AST 54*  ALT 26  ALKPHOS 158*  BILITOT 4.8*  PROT 6.4*  ALBUMIN 2.6*   No results for input(s): LIPASE, AMYLASE in the last 72 hours. Cardiac Enzymes No results for input(s): CKTOTAL, CKMB, CKMBINDEX, TROPONINI in the last 72 hours. BNP Invalid input(s): POCBNP D-Dimer No results for input(s): DDIMER in the last 72 hours. Hemoglobin A1C No results for input(s):  HGBA1C in the last 72 hours. Fasting Lipid Panel No results for input(s): CHOL, HDL, LDLCALC, TRIG, CHOLHDL, LDLDIRECT in the last 72 hours. Thyroid Function Tests No results for input(s): TSH, T4TOTAL, T3FREE, THYROIDAB in the last 72 hours.  Invalid input(s): FREET3  Telemetry    Atrial fib - Personally Reviewed  ECG   NA  Radiology    Dg Chest Port 1 View  Result Date: 03/02/2016 CLINICAL DATA:  New onset shortness of breath this morning. Seventh and oblique cane since yesterday, history of CHF. Hypoxia and tachycardia. EXAM: PORTABLE CHEST 1 VIEW COMPARISON:  10/18/2014 FINDINGS: Patient is post median sternotomy and TAVR. The heart is enlarged, accentuated by low lung volumes and portable technique. Mild perihilar pulmonary edema. Linear atelectasis in both mid lung zones. No large pleural effusion, basilar assessment limited by technique. No pneumothorax or acute osseous abnormality. IMPRESSION: Cardiomegaly with perihilar edema concerning for CHF. Linear midlung zone atelectasis.  Electronically Signed   By: Rubye OaksMelanie  Ehinger M.D.   On: 03/15/2016 18:40    Cardiac Studies   Echo pending.   Patient Profile     Jamie Howell is a 65 y.o. female with a hx of Rheumatic heart disease status post remote prior mechanical MVR and subsequent TAVR in 6/16.  She was admitted 12/8 with nausea fatigue and increased INR as well as atrial fib with rapid rate.   Assessment & Plan    ACUTE SYSTOLIC AND DIASTOLIC HEART FAILURE:   I/O are incomplete but slightly positive.  Creat is still up.  Will, continue to hold beta blocker and ACE inhibitor.  On Bumex IV for diuresis but need to follow create closely.  Possibly start dopamine if BP remains soft although it is better this AM.   ELEVATED INR:  8.35 slightly down.  I will add vitamin K today PO.  No active bleeding.  STATUS POST TAVR/MVR:  Anticoagulation as above.   ATRIAL FIB/FLUTTER:   Rate is controlled.  Continue amiodarone.   CKD STAGE III - IV:  Creat is still slightly increased.      PULMONARY HTN:   Continue current therapy.   Signed, Rollene RotundaJames Mahum Betten, MD  03/10/2016, 7:39 AM

## 2016-03-10 NOTE — Progress Notes (Signed)
CRITICAL VALUE ALERT  Critical value received: INR 8.35  Date of notification:  03/10/2016  Time of notification:  /0205  Critical value read back:Yes.    Nurse who received alert:  A.Deivi Huckins  MD notified (1st page): Harris,G  Time of first page: 0225  MD notified (2nd page):  Time of second page:  Responding MD: Haskel KhanHarris,G  Time MD responded: [phone]

## 2016-03-10 NOTE — Progress Notes (Signed)
Pt became more SOB around 1115am -- HR 120s. O2 increased to 3l/min.  Pt continues SOB  --12 noon HR 140s and rhythm change noted with wide complex.   Pt. States more SOB.  O2 increased to 4l/min. Pt. With perfusing rhythm.  PA notified. EKG obtained showing unspecified rhythm -possible Atrial Tach. Dr. Antoine PocheHochrein in and Amiodarone bolus 150 given and drip started.

## 2016-03-10 NOTE — Progress Notes (Addendum)
  Amiodarone Drug - Drug Interaction Consult Note  Recommendations:  -No recommendations for changes at this time  Potential Interactions: -Amio/Ondansetron can prolong Qtc. Monitor QTc while on both medications (QTc 465) -Amio/warfarin together can potentiate INR and increase risk of bleeding. Warfarin is currently on hold.    Amiodarone is metabolized by the cytochrome P450 system and therefore has the potential to cause many drug interactions. Amiodarone has an average plasma half-life of 50 days (range 20 to 100 days).   There is potential for drug interactions to occur several weeks or months after stopping treatment and the onset of drug interactions may be slow after initiating amiodarone.   []  Statins: Increased risk of myopathy. Simvastatin- restrict dose to 20mg  daily. Other statins: counsel patients to report any muscle pain or weakness immediately.  []  Anticoagulants: Amiodarone can increase anticoagulant effect. Consider warfarin dose reduction. Patients should be monitored closely and the dose of anticoagulant altered accordingly, remembering that amiodarone levels take several weeks to stabilize.  []  Antiepileptics: Amiodarone can increase plasma concentration of phenytoin, the dose should be reduced. Note that small changes in phenytoin dose can result in large changes in levels. Monitor patient and counsel on signs of toxicity.  []  Beta blockers: increased risk of bradycardia, AV block and myocardial depression. Sotalol - avoid concomitant use.  []   Calcium channel blockers (diltiazem and verapamil): increased risk of bradycardia, AV block and myocardial depression.  []   Cyclosporine: Amiodarone increases levels of cyclosporine. Reduced dose of cyclosporine is recommended.  []  Digoxin dose should be halved when amiodarone is started.  []  Diuretics: increased risk of cardiotoxicity if hypokalemia occurs.  []  Oral hypoglycemic agents (glyburide, glipizide, glimepiride):  increased risk of hypoglycemia. Patient's glucose levels should be monitored closely when initiating amiodarone therapy.   []  Drugs that prolong the QT interval:  Torsades de pointes risk may be increased with concurrent use - avoid if possible.  Monitor QTc, also keep magnesium/potassium WNL if concurrent therapy can't be avoided. Marland Kitchen. Antibiotics: e.g. fluoroquinolones, erythromycin. . Antiarrhythmics: e.g. quinidine, procainamide, disopyramide, sotalol. . Antipsychotics: e.g. phenothiazines, haloperidol.  . Lithium, tricyclic antidepressants, and methadone.  Thank You,  Sandi CarneNick Orrin Yurkovich, PharmD, BCPS Pharmacy Resident Pager: [phone] 03/10/2016 1:33 PM

## 2016-03-10 NOTE — Progress Notes (Signed)
CRITICAL VALUE ALERT  Critical value received: INR 5.45  Date of notification:  03/10/2016  Time of notification: 1845  Critical value read back:Yes.    Nurse who received alert:  Adin HectorK. Tabatha Razzano  MD notified (1st page):   Time of first page: 1845  MD notified (2nd page):  Time of second page:  Responding MD:   Time MD responded:

## 2016-03-10 NOTE — Progress Notes (Signed)
  Echocardiogram 2D Echocardiogram has been performed.  Jamie Howell, Jamie Howell 03/10/2016, 4:27 PM

## 2016-03-10 NOTE — Progress Notes (Signed)
CRITICAL VALUE ALERT  Critical value received:  INR 5.27  Date of notification:  03/10/2016  Time of notification:  1225  Critical value read back:Yes.    Nurse who received alert: Tonette Ledererhurch, Shloma Roggenkamp Cartner  Dr Antoine PocheHochrein at bedside and notified. No new orders.

## 2016-03-11 LAB — BLOOD CULTURE ID PANEL (REFLEXED)
Acinetobacter baumannii: NOT DETECTED
CANDIDA ALBICANS: NOT DETECTED
CANDIDA PARAPSILOSIS: NOT DETECTED
CANDIDA TROPICALIS: NOT DETECTED
CARBAPENEM RESISTANCE: NOT DETECTED
Candida glabrata: NOT DETECTED
Candida krusei: NOT DETECTED
ENTEROBACTER CLOACAE COMPLEX: NOT DETECTED
ENTEROCOCCUS SPECIES: NOT DETECTED
Enterobacteriaceae species: NOT DETECTED
Escherichia coli: NOT DETECTED
HAEMOPHILUS INFLUENZAE: NOT DETECTED
KLEBSIELLA PNEUMONIAE: NOT DETECTED
Klebsiella oxytoca: NOT DETECTED
LISTERIA MONOCYTOGENES: NOT DETECTED
METHICILLIN RESISTANCE: NOT DETECTED
Neisseria meningitidis: NOT DETECTED
PROTEUS SPECIES: NOT DETECTED
Pseudomonas aeruginosa: NOT DETECTED
STAPHYLOCOCCUS AUREUS BCID: NOT DETECTED
STAPHYLOCOCCUS SPECIES: NOT DETECTED
STREPTOCOCCUS PNEUMONIAE: NOT DETECTED
STREPTOCOCCUS PYOGENES: NOT DETECTED
Serratia marcescens: NOT DETECTED
Streptococcus agalactiae: DETECTED — AB
Streptococcus species: DETECTED — AB
VANCOMYCIN RESISTANCE: NOT DETECTED

## 2016-03-11 LAB — PROTIME-INR
INR: 4.64 — AB
Prothrombin Time: 45.2 seconds — ABNORMAL HIGH (ref 11.4–15.2)

## 2016-03-11 MED ORDER — MORPHINE SULFATE (PF) 2 MG/ML IV SOLN: 1.0000 mg | INTRAVENOUS | Status: DC | PRN

## 2016-03-11 MED ORDER — CEFAZOLIN SODIUM-DEXTROSE 2-4 GM/100ML-% IV SOLN
2.0000 g | Freq: Two times a day (BID) | INTRAVENOUS | Status: DC
  Administered 2016-03-11: 2 g via INTRAVENOUS
  Filled 2016-03-11 (×3): qty 100

## 2016-03-12 LAB — CULTURE, BLOOD (ROUTINE X 2)

## 2016-03-14 LAB — BLOOD CULTURE ID PANEL (REFLEXED)
ACINETOBACTER BAUMANNII: NOT DETECTED
CANDIDA ALBICANS: NOT DETECTED
CANDIDA GLABRATA: NOT DETECTED
CANDIDA PARAPSILOSIS: NOT DETECTED
CANDIDA TROPICALIS: NOT DETECTED
Candida krusei: NOT DETECTED
ENTEROBACTER CLOACAE COMPLEX: NOT DETECTED
ENTEROBACTERIACEAE SPECIES: NOT DETECTED
ESCHERICHIA COLI: NOT DETECTED
Enterococcus species: NOT DETECTED
Haemophilus influenzae: NOT DETECTED
KLEBSIELLA OXYTOCA: NOT DETECTED
KLEBSIELLA PNEUMONIAE: NOT DETECTED
Listeria monocytogenes: NOT DETECTED
Neisseria meningitidis: NOT DETECTED
Proteus species: NOT DETECTED
Pseudomonas aeruginosa: NOT DETECTED
STREPTOCOCCUS AGALACTIAE: NOT DETECTED
Serratia marcescens: NOT DETECTED
Staphylococcus aureus (BCID): NOT DETECTED
Staphylococcus species: NOT DETECTED
Streptococcus pneumoniae: NOT DETECTED
Streptococcus pyogenes: NOT DETECTED
Streptococcus species: NOT DETECTED

## 2016-03-16 LAB — CULTURE, BLOOD (ROUTINE X 2)

## 2016-04-02 NOTE — Progress Notes (Signed)
Dr. Zachery ConchFriedman notified of Lab call again to report 2nd set of Blood cultures drawn 12/8 while patient was in the ED, Aerobic bottle grew Gram + Cocci in chains, Group Strep B. No new orders received.

## 2016-04-02 NOTE — Discharge Summary (Addendum)
    DEATH SUMMARY  Patient Name:  Jamie Howell  MRN: [id number]  PCP: Londell MohPHARR,[name], MD  DOB:  04/06/1950               Date of Admission: 03/03/2016     Date of Death: 03/20/2016            Attending Physician: No att. providers found        Cause of Death: Chronic combined systolic and diastolic CHF (congestive heart failure) (HCC)  Secondary Diagnoses Present on Admission: . Persistent atrial fibrillation (HCC) . Chronic combined systolic and diastolic CHF (congestive heart failure) (HCC) . DNR (do not resuscitate) . Supratherapeutic INR . HTN (hypertension) . 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 CHF (congestive heart failure), NYHA class IV, acute on chronic, combined (HCC)  Streptococcus agalactiae bacteremia.    Hospital Course: The patient presented with acute on chronic respiratory failure.  She had progressive decline during her hospitalization.  She was a DNR.  She had a wide complex tachycardia that could not be treated with cardioversion per her wishes.  She did have blood cultures that grew positive for GBS and was appropriately treated with antibiotics.  She was did have reversal of her INR which was supratherapeutic on presentation.  Despite diuresis and treatment with amiodarone the patient continued to deteriorate throughout the evening of 12/10.  Her family was notified.  She was pronounced dead at 03:29 am.      Signed: Rollene RotundaJames Aleida Crandell 03/20/2016, 9:06 PM

## 2016-04-02 NOTE — Progress Notes (Signed)
PHARMACY - PHYSICIAN COMMUNICATION CRITICAL VALUE ALERT - BLOOD CULTURE IDENTIFICATION (BCID)  Results for orders placed or performed during the hospital encounter of 03/10/2016  Blood Culture ID Panel (Reflexed) (Collected: 03/31/2016  9:25 PM)  Result Value Ref Range   Enterococcus species NOT DETECTED NOT DETECTED   Vancomycin resistance NOT DETECTED NOT DETECTED   Listeria monocytogenes NOT DETECTED NOT DETECTED   Staphylococcus species NOT DETECTED NOT DETECTED   Staphylococcus aureus NOT DETECTED NOT DETECTED   Methicillin resistance NOT DETECTED NOT DETECTED   Streptococcus species DETECTED (A) NOT DETECTED   Streptococcus agalactiae DETECTED (A) NOT DETECTED   Streptococcus pneumoniae NOT DETECTED NOT DETECTED   Streptococcus pyogenes NOT DETECTED NOT DETECTED   Acinetobacter baumannii NOT DETECTED NOT DETECTED   Enterobacteriaceae species NOT DETECTED NOT DETECTED   Enterobacter cloacae complex NOT DETECTED NOT DETECTED   Escherichia coli NOT DETECTED NOT DETECTED   Klebsiella oxytoca NOT DETECTED NOT DETECTED   Klebsiella pneumoniae NOT DETECTED NOT DETECTED   Proteus species NOT DETECTED NOT DETECTED   Serratia marcescens NOT DETECTED NOT DETECTED   Carbapenem resistance NOT DETECTED NOT DETECTED   Haemophilus influenzae NOT DETECTED NOT DETECTED   Neisseria meningitidis NOT DETECTED NOT DETECTED   Pseudomonas aeruginosa NOT DETECTED NOT DETECTED   Candida albicans NOT DETECTED NOT DETECTED   Candida glabrata NOT DETECTED NOT DETECTED   Candida krusei NOT DETECTED NOT DETECTED   Candida parapsilosis NOT DETECTED NOT DETECTED   Candida tropicalis NOT DETECTED NOT DETECTED    Name of physician (or Provider) Contacted: Dr. Zachery ConchFriedman (Cardiology)  Changes to prescribed antibiotics required: Add Ancef 2g IV q12h (adjusted for renal function)  Abran DukeLedford, Talasia Saulter 07-Oct-2015  1:42 AM

## 2016-04-02 NOTE — Significant Event (Addendum)
Paged at 1:40am re: development of WCT @ 130bpm with stable VS.   I came to evaluate the patient. She actually stated she felt a bit better compared to an hour prior (perhaps related to morphine?). Rales appeared to be worsening compared to earlier in the evening and resp distress appeared similar to worse. ECG demonstrated WCT with RBBB like morphology with northwest axis at rate of 136bpm.  Tele revealed gradual onset of tachycardia with abrupt change to wider complex rhythm, but with similar rate compared to more narrow rhythm. Although the ECG seemed to suggest VT (based on NW axis and atypical RBBB appearance), telemetry seemed to suggest SVT with abberrancy. Several amiodarone boluses were given with slowing of the HR to approximately 100bpm without termination.  Ms. Jamie Howell developed progressively more respiratory distress during this period of time.  Based on the patient's DNR status, we did not offer any more aggressive treatment.   I then called the patient's son Peterson Lombarddmund at [phone] to let him know that it appears that his mother is actively dying. I suggested that if he or any other family members wanted to be at the bedside when Ms. Houseman passes, now would be a good time to come in. All questions were answered. The patients son said he would try to make it in.   At this point, will hold off any further lab draws or diagnostic testing.  Will continue abx and IV diuretics for the time being with PRN morphine.   ADDENDUM Ms. Linsley passed at 3:29am. I attempted to call Peterson Lombarddmund but it went to voicemail both times.

## 2016-04-02 NOTE — Progress Notes (Signed)
Lab called to report Blood Culture drawn 03/08/2016 while patient was in the ED, Aerobic bottle grew Gram + Cocci in chains, Group Strep B.  Dr. Zachery ConchFriedman notified and ordered a urine C/S and started patient on Ancef 2Gm dose. Patient remains with air hunger not not voicing complaints. Monitoring closely.

## 2016-04-02 NOTE — Progress Notes (Signed)
Paged Dr. Zachery ConchFriedman tonight when patient called RN into room to complain about difficult breathing. Patient resting in bed with Labor breathing and O2 at 4L/[place]. VSS on monitor, sats reading from 80's to 90's. Dr. Zachery ConchFriedman came to evaluate the patient and ordered Lasix bolus and gtt, also some Morphine to ease labored breathing. IV team consulted for new IV, Lasix and Amiodarone not compatible. Morphine given IV push but patient not breathing easier. She stated she does not hurt anywhere. Will continue close monitoring.

## 2016-04-02 NOTE — Progress Notes (Signed)
Dr. Zachery ConchFriedman remains at patients bedside during patients epidsodes of VT and he called patients son Peterson Lombarddmund to report a change in his mothers condition and he should try to get to the hospital. Peterson Lombarddmund stated he would try to get here. Patient condition continued to worsen and several RN's stayed at bedside with patient.. Dr. Zachery ConchFriedman outside the room. Patient HR continued to decrease and she was pronounced deceased at 0329 AM. No family at bedside. See strip in chart.

## 2016-04-02 NOTE — Progress Notes (Signed)
Paged Dr. Zachery ConchFriedman again to report patients status with monitor reading continuing VT episodes. Patient awake and alert voiced no complaints. Dr. Alphonsus SiasFreidman ordered a stat EKG, and he came to see patient. See his note. HR 136 sustaining. New order received to give 150mg  bolus of Amiodarone and see if her HR slows down, Bolus given and HR sustained 120's. Another Bolus order of 150mg  and it was given. HR down to the 110's. Lasix bolus of 160mg  given in another IV site but no urine output noted. Lasix gtt started at 3510ml/hr. Patient given another dose of Morphine to help with her breathing. Patient repositioned in bed and HOB raised to 30 degrees. Patient stated she was comfortable.

## 2016-04-02 NOTE — Progress Notes (Signed)
CRITICAL VALUE ALERT  Critical value received:  Aerobic Blood Culture Bottle Gram + Cocci in chains, Group B Strep  Date of notification:  10-Apr-2015  Time of notification:  0120  Critical value read back:Yes.    Nurse who received alert:  A. Bricia Taher  MD notified (1st page):  Alphonsus SiasFreidman, MD  Time of first page: 0123  MD notified (2nd page):  Time of second page:  Responding MD: Zachery ConchFriedman  Time MD responded: [phone]

## 2016-04-02 NOTE — Significant Event (Addendum)
Paged re: positive blood cultures: GBS.  Ordered UA/UCx to investigate this as source. Discussed abx with pharmacy -- will initiate Ancef rather than Pen G (1st line) due to PCN allergy (rash).   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 A TEE will need to be considered, but will be higher risk due to her varices and elevated INR.  She is clinically in HF and feeling very dyspneic. She is normotensive, warm, and has rales. She probably will still benefit from some cautious diuresis despite her bacteremia, but the situation is obviously tenuous.

## 2016-04-02 DEATH — deceased

## 2016-04-04 ENCOUNTER — Telehealth: Payer: Self-pay | Admitting: Cardiology

## 2016-04-04 ENCOUNTER — Telehealth: Payer: Self-pay

## 2016-04-04 NOTE — Telephone Encounter (Signed)
Received Death Certificate  (via [employer]) for Dr Antoine PocheHochrein to review, complete and sign.  Certificate to be given to Dr Antoine PocheHochrein on 04/05/16 for his schedule. lp

## 2016-04-04 NOTE — Telephone Encounter (Signed)
Original Death Certificate received from [facility]  Faxed copy to [place] at [place] sent original interoffice to see if Dr.Hochrein will sign.

## 2016-04-05 ENCOUNTER — Encounter (HOSPITAL_COMMUNITY): Payer: Commercial Managed Care - HMO | Admitting: Internal Medicine

## 2016-04-09 ENCOUNTER — Telehealth: Payer: Self-pay | Admitting: Cardiology

## 2016-04-09 NOTE — Telephone Encounter (Signed)
Spoke with Chenelle at [facility] she is aware that D/C has been sent to  Dr.Hochrein for signing.

## 2016-04-10 ENCOUNTER — Ambulatory Visit: Payer: PPO | Admitting: Physician Assistant

## 2016-05-30 ENCOUNTER — Ambulatory Visit: Payer: Commercial Managed Care - HMO | Admitting: Cardiology

## 2016-06-27 ENCOUNTER — Ambulatory Visit: Payer: PPO | Admitting: Cardiology
